# Patient Record
Sex: Male | Born: 1945
Health system: Southern US, Community
[De-identification: ages and names within clinical notes are randomized; demographics above are authoritative.]

## PROBLEM LIST (undated history)

## (undated) DIAGNOSIS — R0600 Dyspnea, unspecified: Secondary | ICD-10-CM

## (undated) DIAGNOSIS — N183 Chronic kidney disease, stage 3 unspecified: Secondary | ICD-10-CM

## (undated) DIAGNOSIS — E119 Type 2 diabetes mellitus without complications: Secondary | ICD-10-CM

## (undated) DIAGNOSIS — I2510 Atherosclerotic heart disease of native coronary artery without angina pectoris: Secondary | ICD-10-CM

## (undated) DIAGNOSIS — J449 Chronic obstructive pulmonary disease, unspecified: Secondary | ICD-10-CM

## (undated) DIAGNOSIS — E669 Obesity, unspecified: Secondary | ICD-10-CM

## (undated) DIAGNOSIS — I219 Acute myocardial infarction, unspecified: Secondary | ICD-10-CM

## (undated) DIAGNOSIS — K219 Gastro-esophageal reflux disease without esophagitis: Secondary | ICD-10-CM

## (undated) DIAGNOSIS — F329 Major depressive disorder, single episode, unspecified: Secondary | ICD-10-CM

## (undated) DIAGNOSIS — R413 Other amnesia: Secondary | ICD-10-CM

## (undated) DIAGNOSIS — D126 Benign neoplasm of colon, unspecified: Secondary | ICD-10-CM

## (undated) DIAGNOSIS — G4730 Sleep apnea, unspecified: Secondary | ICD-10-CM

## (undated) DIAGNOSIS — I10 Essential (primary) hypertension: Secondary | ICD-10-CM

## (undated) DIAGNOSIS — K922 Gastrointestinal hemorrhage, unspecified: Secondary | ICD-10-CM

## (undated) DIAGNOSIS — E7800 Pure hypercholesterolemia, unspecified: Secondary | ICD-10-CM

## (undated) DIAGNOSIS — K635 Polyp of colon: Secondary | ICD-10-CM

## (undated) DIAGNOSIS — F32A Depression, unspecified: Secondary | ICD-10-CM

## (undated) DIAGNOSIS — R42 Dizziness and giddiness: Secondary | ICD-10-CM

## (undated) DIAGNOSIS — K2270 Barrett's esophagus without dysplasia: Secondary | ICD-10-CM

## (undated) DIAGNOSIS — J9383 Other pneumothorax: Secondary | ICD-10-CM

## (undated) HISTORY — DX: Chronic obstructive pulmonary disease, unspecified: J44.9

## (undated) HISTORY — DX: Acute myocardial infarction, unspecified: I21.9

## (undated) HISTORY — DX: Dyspnea, unspecified: R06.00

## (undated) HISTORY — DX: Dizziness and giddiness: R42

## (undated) HISTORY — DX: Essential (primary) hypertension: I10

## (undated) HISTORY — DX: Other amnesia: R41.3

## (undated) HISTORY — DX: Gastrointestinal hemorrhage, unspecified: K92.2

## (undated) HISTORY — PX: VARICOCELECTOMY: SHX1084

## (undated) HISTORY — DX: Type 2 diabetes mellitus without complications: E11.9

## (undated) HISTORY — DX: Polyp of colon: K63.5

## (undated) HISTORY — DX: Atherosclerotic heart disease of native coronary artery without angina pectoris: I25.10

## (undated) HISTORY — DX: Other pneumothorax: J93.83

## (undated) HISTORY — DX: Sleep apnea, unspecified: G47.30

## (undated) HISTORY — DX: Chronic kidney disease, stage 3 unspecified: N18.30

## (undated) HISTORY — DX: Obesity, unspecified: E66.9

## (undated) HISTORY — DX: Gastro-esophageal reflux disease without esophagitis: K21.9

## (undated) HISTORY — DX: Barrett's esophagus without dysplasia: K22.70

## (undated) HISTORY — DX: Depression, unspecified: F32.A

## (undated) HISTORY — DX: Chronic kidney disease, stage 3 (moderate): N18.3

## (undated) HISTORY — PX: APPENDECTOMY: SHX54

## (undated) HISTORY — PX: TONSILLECTOMY: SUR1361

## (undated) HISTORY — DX: Benign neoplasm of colon, unspecified: D12.6

## (undated) HISTORY — DX: Pure hypercholesterolemia, unspecified: E78.00

## (undated) HISTORY — DX: Major depressive disorder, single episode, unspecified: F32.9

---

## 1985-11-25 HISTORY — PX: PLEURAL SCARIFICATION: SHX748

## 1999-04-14 ENCOUNTER — Encounter: Payer: Self-pay | Admitting: Emergency Medicine

## 1999-04-14 ENCOUNTER — Emergency Department (HOSPITAL_COMMUNITY): Admission: EM | Admit: 1999-04-14 | Discharge: 1999-04-14 | Payer: Self-pay | Admitting: Emergency Medicine

## 2000-04-24 ENCOUNTER — Encounter: Admission: RE | Admit: 2000-04-24 | Discharge: 2000-04-24 | Payer: Self-pay | Admitting: Family Medicine

## 2000-04-24 ENCOUNTER — Encounter: Payer: Self-pay | Admitting: Family Medicine

## 2001-02-02 ENCOUNTER — Encounter: Admission: RE | Admit: 2001-02-02 | Discharge: 2001-02-02 | Payer: Self-pay | Admitting: Family Medicine

## 2001-02-02 ENCOUNTER — Encounter: Payer: Self-pay | Admitting: Family Medicine

## 2001-02-03 ENCOUNTER — Encounter: Admission: RE | Admit: 2001-02-03 | Discharge: 2001-02-03 | Payer: Self-pay | Admitting: Family Medicine

## 2001-02-03 ENCOUNTER — Encounter: Payer: Self-pay | Admitting: Family Medicine

## 2001-02-06 ENCOUNTER — Encounter: Admission: RE | Admit: 2001-02-06 | Discharge: 2001-02-06 | Payer: Self-pay | Admitting: Thoracic Surgery

## 2001-02-06 ENCOUNTER — Encounter: Payer: Self-pay | Admitting: Thoracic Surgery

## 2001-02-11 ENCOUNTER — Encounter: Payer: Self-pay | Admitting: Thoracic Surgery

## 2001-02-11 ENCOUNTER — Encounter: Admission: RE | Admit: 2001-02-11 | Discharge: 2001-02-11 | Payer: Self-pay | Admitting: Thoracic Surgery

## 2001-02-17 ENCOUNTER — Encounter: Admission: RE | Admit: 2001-02-17 | Discharge: 2001-02-17 | Payer: Self-pay | Admitting: Thoracic Surgery

## 2001-02-17 ENCOUNTER — Encounter: Payer: Self-pay | Admitting: Thoracic Surgery

## 2001-04-03 ENCOUNTER — Encounter: Admission: RE | Admit: 2001-04-03 | Discharge: 2001-04-03 | Payer: Self-pay | Admitting: Thoracic Surgery

## 2001-04-03 ENCOUNTER — Encounter: Payer: Self-pay | Admitting: Thoracic Surgery

## 2001-04-17 ENCOUNTER — Encounter: Admission: RE | Admit: 2001-04-17 | Discharge: 2001-04-17 | Payer: Self-pay | Admitting: Thoracic Surgery

## 2001-04-17 ENCOUNTER — Encounter: Payer: Self-pay | Admitting: Thoracic Surgery

## 2001-04-22 ENCOUNTER — Inpatient Hospital Stay (HOSPITAL_COMMUNITY): Admission: RE | Admit: 2001-04-22 | Discharge: 2001-04-26 | Payer: Self-pay | Admitting: Thoracic Surgery

## 2001-04-22 ENCOUNTER — Encounter: Payer: Self-pay | Admitting: Thoracic Surgery

## 2001-04-23 ENCOUNTER — Encounter: Payer: Self-pay | Admitting: Thoracic Surgery

## 2001-04-24 ENCOUNTER — Encounter: Payer: Self-pay | Admitting: Thoracic Surgery

## 2001-04-25 ENCOUNTER — Encounter: Payer: Self-pay | Admitting: Thoracic Surgery

## 2001-04-26 ENCOUNTER — Encounter: Payer: Self-pay | Admitting: Thoracic Surgery

## 2001-05-11 ENCOUNTER — Encounter: Admission: RE | Admit: 2001-05-11 | Discharge: 2001-05-11 | Payer: Self-pay | Admitting: Thoracic Surgery

## 2001-05-11 ENCOUNTER — Encounter: Payer: Self-pay | Admitting: Thoracic Surgery

## 2001-05-27 ENCOUNTER — Encounter: Admission: RE | Admit: 2001-05-27 | Discharge: 2001-05-27 | Payer: Self-pay | Admitting: Thoracic Surgery

## 2001-05-27 ENCOUNTER — Encounter: Payer: Self-pay | Admitting: Thoracic Surgery

## 2001-07-29 ENCOUNTER — Encounter: Payer: Self-pay | Admitting: Thoracic Surgery

## 2001-07-29 ENCOUNTER — Encounter: Admission: RE | Admit: 2001-07-29 | Discharge: 2001-07-29 | Payer: Self-pay | Admitting: Thoracic Surgery

## 2001-10-27 ENCOUNTER — Encounter: Payer: Self-pay | Admitting: Thoracic Surgery

## 2001-10-27 ENCOUNTER — Encounter: Admission: RE | Admit: 2001-10-27 | Discharge: 2001-10-27 | Payer: Self-pay | Admitting: Thoracic Surgery

## 2002-03-16 ENCOUNTER — Encounter: Payer: Self-pay | Admitting: Family Medicine

## 2002-03-16 ENCOUNTER — Encounter: Admission: RE | Admit: 2002-03-16 | Discharge: 2002-03-16 | Payer: Self-pay | Admitting: Family Medicine

## 2002-08-31 ENCOUNTER — Encounter: Admission: RE | Admit: 2002-08-31 | Discharge: 2002-08-31 | Payer: Self-pay | Admitting: Thoracic Surgery

## 2002-08-31 ENCOUNTER — Encounter: Payer: Self-pay | Admitting: Thoracic Surgery

## 2005-11-25 HISTORY — PX: CORONARY ANGIOPLASTY WITH STENT PLACEMENT: SHX49

## 2006-02-28 ENCOUNTER — Ambulatory Visit: Payer: Self-pay | Admitting: Cardiovascular Disease

## 2006-02-28 ENCOUNTER — Inpatient Hospital Stay (HOSPITAL_COMMUNITY): Admission: EM | Admit: 2006-02-28 | Discharge: 2006-03-02 | Payer: Self-pay | Admitting: Emergency Medicine

## 2006-03-24 ENCOUNTER — Ambulatory Visit: Payer: Self-pay | Admitting: Cardiovascular Disease

## 2006-03-25 ENCOUNTER — Encounter: Admission: RE | Admit: 2006-03-25 | Discharge: 2006-03-25 | Payer: Self-pay | Admitting: Cardiovascular Disease

## 2006-03-28 ENCOUNTER — Encounter (HOSPITAL_COMMUNITY): Admission: RE | Admit: 2006-03-28 | Discharge: 2006-06-26 | Payer: Self-pay | Admitting: Cardiovascular Disease

## 2006-05-26 ENCOUNTER — Ambulatory Visit: Payer: Self-pay | Admitting: Cardiovascular Disease

## 2006-06-27 ENCOUNTER — Encounter (HOSPITAL_COMMUNITY): Admission: RE | Admit: 2006-06-27 | Discharge: 2006-08-05 | Payer: Self-pay | Admitting: Cardiovascular Disease

## 2006-08-29 ENCOUNTER — Ambulatory Visit: Payer: Self-pay | Admitting: Cardiovascular Disease

## 2006-11-27 ENCOUNTER — Ambulatory Visit (HOSPITAL_COMMUNITY): Admission: RE | Admit: 2006-11-27 | Discharge: 2006-11-27 | Payer: Self-pay | Admitting: Family Medicine

## 2007-03-04 ENCOUNTER — Ambulatory Visit: Payer: Self-pay | Admitting: Cardiovascular Disease

## 2007-08-26 ENCOUNTER — Ambulatory Visit: Payer: Self-pay

## 2007-08-31 ENCOUNTER — Ambulatory Visit: Payer: Self-pay | Admitting: Cardiovascular Disease

## 2007-10-09 ENCOUNTER — Ambulatory Visit (HOSPITAL_COMMUNITY): Admission: RE | Admit: 2007-10-09 | Discharge: 2007-10-09 | Payer: Self-pay | Admitting: *Deleted

## 2007-11-26 DIAGNOSIS — J449 Chronic obstructive pulmonary disease, unspecified: Secondary | ICD-10-CM

## 2007-11-26 HISTORY — DX: Chronic obstructive pulmonary disease, unspecified: J44.9

## 2008-06-28 ENCOUNTER — Ambulatory Visit (HOSPITAL_COMMUNITY): Admission: RE | Admit: 2008-06-28 | Discharge: 2008-06-28 | Payer: Self-pay | Admitting: Gastroenterology

## 2008-09-15 ENCOUNTER — Ambulatory Visit: Payer: Self-pay | Admitting: Cardiovascular Disease

## 2009-03-08 DIAGNOSIS — I10 Essential (primary) hypertension: Secondary | ICD-10-CM | POA: Insufficient documentation

## 2009-03-08 DIAGNOSIS — F329 Major depressive disorder, single episode, unspecified: Secondary | ICD-10-CM

## 2009-03-08 DIAGNOSIS — K219 Gastro-esophageal reflux disease without esophagitis: Secondary | ICD-10-CM | POA: Insufficient documentation

## 2009-03-08 DIAGNOSIS — F330 Major depressive disorder, recurrent, mild: Secondary | ICD-10-CM | POA: Insufficient documentation

## 2009-03-08 DIAGNOSIS — R0602 Shortness of breath: Secondary | ICD-10-CM

## 2009-03-08 DIAGNOSIS — E669 Obesity, unspecified: Secondary | ICD-10-CM | POA: Insufficient documentation

## 2009-03-08 DIAGNOSIS — E7800 Pure hypercholesterolemia, unspecified: Secondary | ICD-10-CM | POA: Insufficient documentation

## 2009-03-08 DIAGNOSIS — F332 Major depressive disorder, recurrent severe without psychotic features: Secondary | ICD-10-CM | POA: Insufficient documentation

## 2009-03-08 DIAGNOSIS — I2510 Atherosclerotic heart disease of native coronary artery without angina pectoris: Secondary | ICD-10-CM | POA: Insufficient documentation

## 2009-03-09 ENCOUNTER — Encounter: Payer: Self-pay | Admitting: Cardiovascular Disease

## 2009-03-09 ENCOUNTER — Ambulatory Visit: Payer: Self-pay | Admitting: Cardiovascular Disease

## 2009-03-14 ENCOUNTER — Telehealth: Payer: Self-pay | Admitting: Cardiovascular Disease

## 2009-08-03 ENCOUNTER — Encounter (INDEPENDENT_AMBULATORY_CARE_PROVIDER_SITE_OTHER): Payer: Self-pay | Admitting: *Deleted

## 2009-11-25 DIAGNOSIS — D126 Benign neoplasm of colon, unspecified: Secondary | ICD-10-CM

## 2009-11-25 HISTORY — DX: Benign neoplasm of colon, unspecified: D12.6

## 2009-12-14 ENCOUNTER — Ambulatory Visit: Payer: Self-pay | Admitting: Cardiovascular Disease

## 2009-12-14 ENCOUNTER — Telehealth: Payer: Self-pay | Admitting: Cardiovascular Disease

## 2009-12-15 ENCOUNTER — Other Ambulatory Visit: Payer: Self-pay | Admitting: Cardiovascular Disease

## 2009-12-15 ENCOUNTER — Encounter: Payer: Self-pay | Admitting: Cardiovascular Disease

## 2010-09-04 ENCOUNTER — Telehealth: Payer: Self-pay | Admitting: Cardiovascular Disease

## 2010-09-25 DIAGNOSIS — K635 Polyp of colon: Secondary | ICD-10-CM

## 2010-09-25 HISTORY — DX: Polyp of colon: K63.5

## 2010-10-02 ENCOUNTER — Ambulatory Visit (HOSPITAL_COMMUNITY): Admission: RE | Admit: 2010-10-02 | Discharge: 2010-10-02 | Payer: Self-pay | Admitting: Gastroenterology

## 2010-12-25 NOTE — Progress Notes (Signed)
Summary: lab work  Phone Note Other Incoming Call back at [phone]   Caller: Charmine/[facility] Summary of Call: pt needs to have lab work redrawn, please contact Initial call taken by: Migdalia Dk,  December 14, 2009 12:06 PM  Follow-up for Phone Call        lab wasn't drawn correctly pt needs to return and have it redrawn, pt is aware and will return on Fri 1/21 AM to have redrawn, Charmine is aware Meredith Staggers, RN  December 14, 2009 12:41 PM

## 2010-12-25 NOTE — Assessment & Plan Note (Signed)
Summary: rov   History of Present Illness: [name] is seen today for F/U of CAD.  He had a complex intervention to the LAD/D1 by Dr Juanda Chance in 2007.  He had a non-ischemic myovue in 2008.  He has sig reflux and needs to be on an H2 blocker.  We have him on Protonix instead of omeprazole.  He will have his platlet assay today to make sure his blood is thin enough.  He denies angina or SSCP.  He has been compliant with his meds.  He is active and still working full time at [employer]  Current Problems (verified): 1)  Depression  (ICD-311) 2)  Gerd  (ICD-530.81) 3)  Hypercholesterolemia  (ICD-272.0) 4)  Obesity  (ICD-278.00) 5)  Hypertension  (ICD-401.9) 6)  Dyspnea  (ICD-786.05) 7)  Cad  (ICD-414.00)  Current Medications (verified): 1)  Lipitor 80 Mg Tabs (Atorvastatin Calcium) .... Take One Tablet By Mouth Daily. 2)  Metoprolol Succinate 25 Mg Xr24h-Tab (Metoprolol Succinate) .... Take 1 Tablet By Mouth Once  A Day 3)  Plavix 75 Mg Tabs (Clopidogrel Bisulfate) .Marland Kitchen.. 1 Tablet By Mouth Once A Day 4)  Diovan 160 Mg Tabs (Valsartan) .... Take One Tablet By Mouth Daily 5)  Protonix 40 Mg Tbec (Pantoprazole Sodium) .Marland Kitchen.. 1 Tab By Mouth Once Daily 6)  Budeprion Xl 300 Mg Xr24h-Tab (Bupropion Hcl) .Marland Kitchen.. 1 Tab By Mouth Once Daily 7)  Symbicort 80-4.5 Mcg/act Aero (Budesonide-Formoterol Fumarate) .... As Needed 8)  Aspirin 81 Mg Tbec (Aspirin) .... Take One Tablet By Mouth Daily 9)  One A Day Vitamin .Marland Kitchen.. 1 Tab By Mouth Once Daily 10)  Redualvit Vitamin .Marland Kitchen.. 1 Tab By Mouth Once Daily  Allergies (verified): No Known Drug Allergies  Past History:  Past Medical History: Last updated: 03/08/2009 Current Problems:  DEPRESSION (ICD-311) GERD (ICD-530.81) HYPERCHOLESTEROLEMIA (ICD-272.0) OBESITY (ICD-278.00) HYPERTENSION (ICD-401.9) DYSPNEA (ICD-786.05) CAD (ICD-414.00)  Past Surgical History: Last updated: 03/08/2009 stenting of the LAD by Dr. Juanda Chance which was done in April 2007.  colonoscopy  Umbilical hernia repair  Foot surgery on his left foot by Dr. Lajoyce Corners  Family History: Last updated: 03/08/2009  coronary artery disease.  Social History: Last updated: 03/08/2009  He resides in [place] with his wife.  He is a Chartered certified accountant. Married  Tobacco Use - Yes.  Alcohol Use - no  Review of Systems       Denies fever, malais, weight loss, blurry vision, decreased visual acuity, cough, sputum, SOB, hemoptysis, pleuritic pain, palpitaitons, heartburn, abdominal pain, melena, lower extremity edema, claudication, or rash.   Vital Signs:  Patient profile:   65 year old male Pulse rate:   70 / minute BP supine:   130 / 80  Physical Exam  General:  Affect appropriate Healthy:  appears stated age HEENT: normal Neck supple with no adenopathy JVP normal no bruits no thyromegaly Lungs clear with no wheezing and good diaphragmatic motion Heart:  S1/S2 no murmur,rub, gallop or click PMI normal Abdomen: benighn, BS positve, no tenderness, no AAA no bruit.  No HSM or HJR Distal pulses intact with no bruits No edema Neuro non-focal Skin warm and dry    Impression & Recommendations:  Problem # 1:  HYPERCHOLESTEROLEMIA (ICD-272.0) LFT in 6 montsh His updated medication list for this problem includes:    Lipitor 80 Mg Tabs (Atorvastatin calcium) .Marland Kitchen... Take one tablet by mouth daily.  Problem # 2:  GERD (ICD-530.81) Continue Protonix unless Plavix shown to be ineffective by assay His updated medication list for  this problem includes:    Protonix 40 Mg Tbec (Pantoprazole sodium) .Marland Kitchen... 1 tab by mouth once daily  Problem # 3:  CAD (ICD-414.00) No angina.  Would like to do myovue in October  3 years since intervention.  Check platlet activity today. His updated medication list for this problem includes:    Metoprolol Succinate 25 Mg Xr24h-tab (Metoprolol succinate) .Marland Kitchen... Take 1 tablet by mouth once  a day    Plavix 75 Mg Tabs (Clopidogrel bisulfate) .Marland Kitchen... 1 tablet  by mouth once a day    Aspirin 81 Mg Tbec (Aspirin) .Marland Kitchen... Take one tablet by mouth daily  Patient Instructions: 1)  Your physician recommends that you have lab work today---PTY12- 414.0 401.9  530.81 272.0   go to [facility] admitting office to register--the lab will be done in the main laboratory at [facility] 2)  Your physician wants you to follow-up in: October 2011 with Dr Charlton Haws  You will receive a reminder letter in the mail two months in advance. If you don't receive a letter, please call our office to schedule the follow-up appointment.

## 2010-12-25 NOTE — Progress Notes (Signed)
Summary: discuss medication - colonscopy on 10/18  Phone Note From Other Clinic   Caller: [place] office [phone] Request: Talk with Nurse Summary of Call: discuss medication- pt having colonscopy on 10/18. Initial call taken by: Lorne Skeens,  September 04, 2010 9:55 AM  Follow-up for Phone Call        spoke with [facility] at [place] [facility], pt having colonoscopy 10/18 and they want to know if okay for pt to hold effient 5-7 days prior. will foward for dr Eden Emms review Deliah Goody, RN  September 04, 2010 1:25 PM   Additional Follow-up for Phone Call Additional follow up Details #1::        Yes it is ok to stop Additional Follow-up by: Colon Branch, MD, [facility],  September 06, 2010 10:18 AM    Additional Follow-up for Phone Call Additional follow up Details #2::    lmtcb Scherrie Bateman, LPN  September 06, 2010 10:24 AM SPOKE WITH MELISSA AWARE PT MAY STOP EFFIENT 5-7 DAYS PRIOR TO COLONOSCOPY. PER MELISSA ,PT'S TEST RESCHEDULED  FOR MID NOV. 2011. Follow-up by: Scherrie Bateman, LPN,  September 06, 2010 11:55 AM

## 2010-12-25 NOTE — Medication Information (Signed)
Summary: Protonix and Plavix  Protonix and Plavix   Imported By: Marylou Mccoy 12/14/2009 10:22:56  _____________________________________________________________________  External Attachment:    Type:   Image     Comment:   External Document

## 2011-01-12 ENCOUNTER — Inpatient Hospital Stay (HOSPITAL_COMMUNITY)
Admission: EM | Admit: 2011-01-12 | Discharge: 2011-01-14 | DRG: 582 | Disposition: A | Discharge status: Home / Self Care | Payer: BC Managed Care – PPO | Attending: Internal Medicine | Admitting: Internal Medicine

## 2011-01-12 ENCOUNTER — Emergency Department (HOSPITAL_COMMUNITY): Payer: BC Managed Care – PPO

## 2011-01-12 DIAGNOSIS — IMO0002 Reserved for concepts with insufficient information to code with codable children: Principal | ICD-10-CM | POA: Diagnosis present

## 2011-01-12 DIAGNOSIS — K219 Gastro-esophageal reflux disease without esophagitis: Secondary | ICD-10-CM | POA: Diagnosis present

## 2011-01-12 DIAGNOSIS — D62 Acute posthemorrhagic anemia: Secondary | ICD-10-CM | POA: Diagnosis present

## 2011-01-12 DIAGNOSIS — Z87891 Personal history of nicotine dependence: Secondary | ICD-10-CM

## 2011-01-12 DIAGNOSIS — E785 Hyperlipidemia, unspecified: Secondary | ICD-10-CM | POA: Diagnosis present

## 2011-01-12 DIAGNOSIS — I214 Non-ST elevation (NSTEMI) myocardial infarction: Secondary | ICD-10-CM | POA: Diagnosis present

## 2011-01-12 DIAGNOSIS — Z8249 Family history of ischemic heart disease and other diseases of the circulatory system: Secondary | ICD-10-CM

## 2011-01-12 DIAGNOSIS — E7800 Pure hypercholesterolemia, unspecified: Secondary | ICD-10-CM | POA: Diagnosis present

## 2011-01-12 DIAGNOSIS — F3289 Other specified depressive episodes: Secondary | ICD-10-CM | POA: Diagnosis present

## 2011-01-12 DIAGNOSIS — Z7902 Long term (current) use of antithrombotics/antiplatelets: Secondary | ICD-10-CM

## 2011-01-12 DIAGNOSIS — I10 Essential (primary) hypertension: Secondary | ICD-10-CM | POA: Diagnosis present

## 2011-01-12 DIAGNOSIS — E669 Obesity, unspecified: Secondary | ICD-10-CM | POA: Diagnosis present

## 2011-01-12 DIAGNOSIS — I252 Old myocardial infarction: Secondary | ICD-10-CM

## 2011-01-12 DIAGNOSIS — K922 Gastrointestinal hemorrhage, unspecified: Secondary | ICD-10-CM

## 2011-01-12 DIAGNOSIS — Z9861 Coronary angioplasty status: Secondary | ICD-10-CM

## 2011-01-12 DIAGNOSIS — Y92009 Unspecified place in unspecified non-institutional (private) residence as the place of occurrence of the external cause: Secondary | ICD-10-CM

## 2011-01-12 DIAGNOSIS — I2510 Atherosclerotic heart disease of native coronary artery without angina pectoris: Secondary | ICD-10-CM | POA: Diagnosis present

## 2011-01-12 DIAGNOSIS — Y849 Medical procedure, unspecified as the cause of abnormal reaction of the patient, or of later complication, without mention of misadventure at the time of the procedure: Secondary | ICD-10-CM | POA: Diagnosis present

## 2011-01-12 DIAGNOSIS — F329 Major depressive disorder, single episode, unspecified: Secondary | ICD-10-CM | POA: Diagnosis present

## 2011-01-12 LAB — CBC
MCH: 27.4 pg (ref 26.0–34.0)
MCHC: 33.1 g/dL (ref 30.0–36.0)
MCHC: 33.6 g/dL (ref 30.0–36.0)
Platelets: 194 10*3/uL (ref 150–400)
RBC: 3.61 MIL/uL — ABNORMAL LOW (ref 4.22–5.81)
RDW: 14.1 % (ref 11.5–15.5)

## 2011-01-12 LAB — DIFFERENTIAL
Basophils Absolute: 0 10*3/uL (ref 0.0–0.1)
Basophils Relative: 0 % (ref 0–1)
Eosinophils Absolute: 0.1 10*3/uL (ref 0.0–0.7)
Eosinophils Relative: 1 % (ref 0–5)
Monocytes Absolute: 0.7 10*3/uL (ref 0.1–1.0)

## 2011-01-12 LAB — ABO/RH: ABO/RH(D): A POS

## 2011-01-12 LAB — POCT I-STAT, CHEM 8
Calcium, Ion: 1.13 mmol/L (ref 1.12–1.32)
Glucose, Bld: 180 mg/dL — ABNORMAL HIGH (ref 70–99)
HCT: 31 % — ABNORMAL LOW (ref 39.0–52.0)
Hemoglobin: 10.5 g/dL — ABNORMAL LOW (ref 13.0–17.0)
Potassium: 4.4 mEq/L (ref 3.5–5.1)

## 2011-01-12 LAB — COMPREHENSIVE METABOLIC PANEL
AST: 25 U/L (ref 0–37)
Albumin: 3.5 g/dL (ref 3.5–5.2)
Alkaline Phosphatase: 56 U/L (ref 39–117)
BUN: 19 mg/dL (ref 6–23)
GFR calc Af Amer: >60 mL/min (ref >60)
Potassium: 4.4 mEq/L (ref 3.5–5.1)
Sodium: 140 mEq/L (ref 135–145)
Total Protein: 6.3 g/dL (ref 6.0–8.3)

## 2011-01-12 LAB — CK TOTAL AND CKMB (NOT AT ARMC)
CK, MB: 1.5 ng/mL (ref 0.3–4.0)
CK, MB: 2.1 ng/mL (ref 0.3–4.0)
Relative Index: 1.4 (ref 0.0–2.5)
Relative Index: 2.1 (ref 0.0–2.5)

## 2011-01-13 DIAGNOSIS — I200 Unstable angina: Secondary | ICD-10-CM

## 2011-01-13 LAB — BASIC METABOLIC PANEL
Calcium: 8.2 mg/dL — ABNORMAL LOW (ref 8.4–10.5)
Chloride: 109 mEq/L (ref 96–112)
Creatinine, Ser: 1.13 mg/dL (ref 0.4–1.5)
GFR calc Af Amer: >60 mL/min (ref >60)

## 2011-01-13 LAB — CBC
MCH: 26.9 pg (ref 26.0–34.0)
MCH: 27 pg (ref 26.0–34.0)
MCHC: 33.2 g/dL (ref 30.0–36.0)
MCHC: 33.4 g/dL (ref 30.0–36.0)
MCV: 81.1 fL (ref 78.0–100.0)
Platelets: 192 10*3/uL (ref 150–400)
Platelets: 204 10*3/uL (ref 150–400)
RBC: 3.82 MIL/uL — ABNORMAL LOW (ref 4.22–5.81)
RDW: 14.4 % (ref 11.5–15.5)
RDW: 14.4 % (ref 11.5–15.5)
WBC: 8.1 10*3/uL (ref 4.0–10.5)

## 2011-01-13 LAB — CK TOTAL AND CKMB (NOT AT ARMC)
Relative Index: INVALID (ref 0.0–2.5)
Total CK: 97 U/L (ref 7–232)

## 2011-01-14 ENCOUNTER — Inpatient Hospital Stay (HOSPITAL_COMMUNITY): Payer: BC Managed Care – PPO

## 2011-01-14 DIAGNOSIS — R079 Chest pain, unspecified: Secondary | ICD-10-CM

## 2011-01-14 DIAGNOSIS — I214 Non-ST elevation (NSTEMI) myocardial infarction: Secondary | ICD-10-CM

## 2011-01-14 LAB — BASIC METABOLIC PANEL
GFR calc Af Amer: >60 mL/min (ref >60)
GFR calc non Af Amer: >60 mL/min (ref >60)
Glucose, Bld: 113 mg/dL — ABNORMAL HIGH (ref 70–99)
Potassium: 3.7 mEq/L (ref 3.5–5.1)
Sodium: 139 mEq/L (ref 135–145)

## 2011-01-14 LAB — CBC
HCT: 29.7 % — ABNORMAL LOW (ref 39.0–52.0)
Hemoglobin: 9.7 g/dL — ABNORMAL LOW (ref 13.0–17.0)
RDW: 14.5 % (ref 11.5–15.5)
WBC: 6.5 10*3/uL (ref 4.0–10.5)

## 2011-01-14 MED ORDER — TECHNETIUM TC 99M TETROFOSMIN IV KIT
30.0000 | PACK | Freq: Once | INTRAVENOUS | Status: AC | PRN
  Administered 2011-01-14: 30 via INTRAVENOUS

## 2011-01-14 MED ORDER — TECHNETIUM TC 99M TETROFOSMIN IV KIT
10.0000 | PACK | Freq: Once | INTRAVENOUS | Status: AC | PRN
  Administered 2011-01-14: 10 via INTRAVENOUS

## 2011-01-15 ENCOUNTER — Inpatient Hospital Stay (HOSPITAL_COMMUNITY): Payer: BC Managed Care – PPO

## 2011-01-16 LAB — CROSSMATCH: Unit division: 0

## 2011-01-24 NOTE — Consult Note (Signed)
NAMEGILES, [name]                 ACCOUNT NO.:  [id number]  MEDICAL RECORD NO.:  [id number]           PATIENT TYPE:  I  LOCATION:  2925                         FACILITY:  [facility]  PHYSICIAN:  Learta Codding, MD,FACC DATE OF BIRTH:  Dec 09, 1945  DATE OF CONSULTATION:  01/12/2011 DATE OF DISCHARGE:                                CONSULTATION   REFERRING PHYSICIAN:  Lonia Blood, MD of Hospitalist Service.  PRIMARY CARDIOLOGIST:  Noralyn Pick. Eden Emms, MD, [facility]  REASON FOR CONSULTATION:  Status post GI bleeding after polypectomy after resuming Prusgrel.  HISTORY OF PRESENT ILLNESS:  The patient is a pleasant 65 year old male with a history of coronary artery disease who had a complex intervention to the first diagonal and LAD proper by Dr. Juanda Chance in 2007 and had a nonischemic Myoview in 2008.  He has a history of significant reflux and he has been treated for that.  Dr. Eden Emms reportedly did a P2Y12 test in January 2011 and he was switched from Plavix to Prasugrel.  He has done well with this.  The patient did have apparently a colonoscopy in November 2011 when 2 colonic polyps were removed.  He had a flat polyp in the cecum that was biopsied and not amenable to endoscopic resection.  The patient was recently referred to a tertiary care center at [facility] for removal of the flat polyp.  The patient was given instructions to stop Prasugrel 1 week before his colonoscopy.  However, he was told that he could resume Prasugrel after his procedure.  He also resumed his aspirin.  Today, the patient suddenly became nauseated and noticed a large bloody bowel movement which caused severe weakness.  EMS was called.  The patient also reported substernal chest pain, although he had no acute EKG changes on arrival to the emergency room.  His wife did give him 2 nitroglycerin prior to arrival of EMS.  In the emergency room, the patient has had no further bleeding.  His hemoglobin is around 10, but his  baseline is around 12.  He is being admitted by the hospitalist service and is being seen by GI.  We have been asked to follow the patient and make recommendations regarding his continued use of aspirin and Prasugrel.  The patient stated that he is currently pain-free.  He has no chest pain.  He has no shortness of breath.  His EKG showed no acute ischemic changes.  PAST MEDICAL HISTORY: 1. History of hypercholesteremia. 2. Depression. 3. Obesity. 4. Coronary artery disease and stenting of the LAD by Dr. Juanda Chance in     April 2007. 5. Umbilical hernia repair. 6. Foot surgery by Dr. Lajoyce Corners.  FAMILY HISTORY:  Consistent with coronary artery disease.  SOCIAL HISTORY:  The patient stays with his wife.  Wife is a Chartered certified accountant. He smokes.  He is married.  He denies any alcohol use.  MEDICATIONS ON ADMISSION: 1. Effient 10 mg a day. 2. Bupropion. 3. Diovan 160 mg a day. 4. Metoprolol tartrate 25 mg a day. 5. Imipramine. 6. Pantoprazole. 7. Lipitor. 8. Symbicort. 9. Nitroglycerin.  REVIEW OF SYSTEMS:  The patient denies  currently and nausea or vomiting, no fever or chills.  Positive for hematemesis.  No dysuria or frequency. Remainder of 18-point review of systems is otherwise negative.  PHYSICAL EXAMINATION:  VITAL SIGNS:  Blood pressure is 118/71, heart rate is 83, and temperature is 98.1. GENERAL:  A well-nourished white male in no apparent distress. HEENT:  Pupils isocoric.  PERRLA, EOMI. NECK:  Supple.  Normal carotid upstroke.  No carotid bruits. LUNGS:  Clear breath sounds bilaterally. HEART:  Regular rate and rhythm.  Normal S1 and S2.  No murmurs, rubs, or gallops. ABDOMEN:  Soft and nontender.  No rebound or guarding.  Good bowel sounds. EXTREMITIES:  No cyanosis, clubbing, or edema. NEUROLOGIC:  The patient alert and oriented, grossly nonfocal. SKIN:  Warm and dry. PSYCHIATRIC:  Normal affect.  LABORATORY WORK:  White count is 10.8, hemoglobin is 10.3, hematocrit 31.1,  and platelet count is 219.  Sodium is 140, potassium is 4.4, chloride is 108, carbon dioxide 23, glucose 184, creatinine 1.36, calcium 8.5, total protein 6.3.  Initial troponin was 0.01.  A 12-lead electrocardiogram showed no acute ischemic changes.  PROBLEM LIST: 1. Acute gastrointestinal bleed. 2. Status post complex intervention to the left anterior descending     and diagonal by Dr. Juanda Chance in 2007, on Prasugrel.     a.     Switched from Plavix to Prasugrel after P2Y12 testing a year      ago, despite no recurrent problems with angina or stent      thrombosis. 3. Coronary artery disease.  Details as outlined above.  PLAN: 1. I have discussed with Dr. Lavera Guise the patient's condition.  I agree to     hold aspirin and Plavix at the present time.  I also spoke with the     gastroenterologist and he would also prefer that in addition to     stopping Prasugrel that aspirin is also discontinued.  The patient     had this procedure done on several years ago.  He should be at low     risk for stent thrombosis. 2. The patient will likely need repeat colonoscopy and this will be     decided by his primary service and     Gastroenterology. 3. We would recommend a follow up EKG in the morning as well as serial     cardiac enzymes, but at this point, no further testing is needed.     Learta Codding, MD,FACC     GED/MEDQ  D:  01/12/2011  T:  01/13/2011  Job:  [id number]  Electronically Signed by Lewayne Bunting MDFACC on 01/24/2011 10:28:19 AM

## 2011-01-24 NOTE — H&P (Signed)
[name], [name]                 ACCOUNT NO.:  [id number]  MEDICAL RECORD NO.:  [id number]           PATIENT TYPE:  E  LOCATION:  [facility]                         FACILITY:  [facility]  PHYSICIAN:  Lonia Blood, M.D.       DATE OF BIRTH:  [birth year]  DATE OF ADMISSION:  01/12/2011 DATE OF DISCHARGE:                             HISTORY & PHYSICAL   PRIMARY CARE PHYSICIAN:  Bryan Lemma. Ehinger, M.D.  CHIEF COMPLAINT:  Blood gushing out of me and my chest hurting.  HISTORY OF PRESENT ILLNESS:  Mr. Onnen is a 65 year old gentleman with history of myocardial infarction and stenting of the LAD in April 2007, who also had a recent elective polypectomy at [facility].  Postpolypectomy, he was put back on his Effient.  This morning, he felt queasy in stomach and then promptly he dispatched a very large bloody bowel movement.  Soon after, he fell woozy, and he had started having severe chest pressure with shortness ofbreath and radiating into his left arm.  EMS was dispatched.  The patient received nitroglycerin, which help with this chest pain, and then he arrived to the emergency room.  In the emergency room, he was hypotensive, diaphoretic.  He was bolused twice and immensely increased his blood pressure to 118/71, and they called [place] to admit him.  He is currently chest pain free.  He has not had any more bloody bowel movement since he arrived.  PAST MEDICAL HISTORY: 1. Hypertension. 2. Coronary artery disease. 3. Gastroesophageal reflux disease. 4. History of pneumothoraces. 5. Hyperlipidemia.  HOME MEDICATIONS: 1. Effient 10 mg daily. 2. Bupropion 300 mg daily. 3. Diovan 160 mg daily. 4. Metoprolol 25 mg daily. 5. Imipramine 50 mg daily. 6. Protonix 40 mg daily. 7. Lipitor 80 mg daily. 8. Symbicort 80/4.5 one puff twice a day.  ALLERGIES:  CODEINE.  SOCIAL HISTORY:  The patient does not smoke cigarettes, does not drink alcohol.  Works in a factory.   He quit smoking in 1991, and he has a 30- pack-year history of smoking.  FAMILY HISTORY:  Positive for coronary artery disease.  REVIEW OF SYSTEMS:  As per HPI.  Also positive for some nausea, generalized weakness.  No focal weakness and numbness.  PHYSICAL EXAMINATION:  VITAL SIGNS:  Temperature of 98.1, blood pressure 96/59, pulse 80, respirations 18, saturation 100% on 2 liters of oxygen. GENERAL APPEARANCE:  One of a pale, anxious gentleman.  Alert and oriented to place, person, and time. HEENT:  Head is normocephalic, atraumatic.  Eyes; pupils are equal, round, and reactive to light and accommodation.  Extraocular movements are intact.  Throat is clear. NECK:  Supple.  No JVD. CHEST:  Clear to auscultation.  No wheezes, rhonchi, or crackles. HEART:  Regular rate and rhythm without murmurs, rubs, or gallops. ABDOMEN:  Soft, nontender.  Bowel sounds are present. LOWER EXTREMITIES:  Without edema. SKIN:  Pale, dry.  No suspicious looking rashes. NEUROLOGIC:  Cranial nerves II through XII are intact.  Strength is 5/5 in all 4 extremities.  Sensation is intact.  LABORATORY DATA:  Laboratory values  on admission; white blood cell count is 10.8, hemoglobin 10.3, platelet count 219, [place] 8.4, sodium 140, potassium 4.4, chloride 108, bicarbonate 23, BUN 19, creatinine 1.3, glucose 184, albumin 3.5, troponin-I 0.01.  Sodium is 140, potassium 4.4, chloride 106, BUN 23, creatinine 1.3, calcium 1.1.  Portable chest x-ray shows bibasilar atelectasis.  EKG; normal sinus rhythm without any dynamic acute ST-T changes.  ASSESSMENT:  This is a 65 year old gentleman presenting with large gastrointestinal bleed, suspected to be lower in nature, due to probably post polypectomy versus diverticular bleed versus other cause.  I am doubtful this is upper GI bleed as the patient seems to be fairly stable hemodynamically.  The result of the gastrointestinal bleed is an acute blood loss anemia with drop  in the pressure and hypotension, probably resulting in demand cardiac ischemia.  PLAN:  Mr. Kempker to go to 2900 step-down unit with close cardiac monitoring, transfuse him 2 units of packed red blood cells, take him off of the Effient as approved by the consulting cardiologist, Dr. Andee Lineman.  Dr. Ewing Schlein, Gastroenterology has been called to consult on the case as well.  Further plans of care will depend on the patient's progression off of the Effient after the transfusion.  I am contemplating a nuclear medicine tagged red blood cell scan if he continues to bleed.  For now, I am going to discontinue Diovan, metoprolol as the blood pressure is not allowing me to continue these medications.  I am going to continue the Protonix, Lipitor, Symbicort, and bupropion.     Lonia Blood, M.D.     SL/MEDQ  D:  01/12/2011  T:  01/12/2011  Job:  [id number]  cc:   Noralyn Pick. Eden Emms, MD, [facility] Shirley Friar, MD Bryan Lemma. Manus Gunning, M.D.  Electronically Signed by Lonia Blood M.D. on 01/24/2011 05:32:26 PM

## 2011-01-24 NOTE — Discharge Summary (Signed)
[name], [name]                 ACCOUNT NO.:  [id number]  MEDICAL RECORD NO.:  [id number]           PATIENT TYPE:  I  LOCATION:  2506                         FACILITY:  [facility]  PHYSICIAN:  Lonia Blood, M.D.       DATE OF BIRTH:  [birth year]  DATE OF ADMISSION:  01/12/2011 DATE OF DISCHARGE:  01/14/2011                              DISCHARGE SUMMARY   DISCHARGE DIAGNOSES: 1. Lower gastrointestinal bleeding, most likely post polypectomy. 2. Acute blood loss anemia status post transfused 2 units of packed     red blood cells. 3. Non-ST-elevation myocardial infarction. 4. Coronary artery disease with negative inducible ischemia,on     Myoview. 5. History of myocardial infarction status post stent to the left     anterior descending, April 2007. 6. Hyperlipidemia.  DISCHARGE MEDICATIONS: 1. Bupropion XL 300 mg daily. 2. Diovan 160 mg daily. 3. Iron 1 tablet daily. 4. Imipramine 50 mg daily. 5. Lipitor 80 mg daily. 6. Metoprolol 25 mg daily. 7. Multivitamin 1 tablet daily. 8. Nitroglycerin 0.4 mg every 5 minutes as needed for chest pain. 9. Protonix 40 mg daily. 10.Symbicort 80/4.5 mcg 2 puffs twice a day.  CONDITION ON DISCHARGE:  [name] was discharged in good condition, alert, oriented, in no acute distress, and stable vital signs. Discharge hemoglobin was 9.7.  He will follow up with Dr. Charlott Rakes from [facility] and Dr. Charlton Haws from [facility].  He will also follow up with his primary care physician, Dr. Blair Heys.  PROCEDURES THIS ADMISSION:  The patient underwent a Myoview stress test on January 14, 2011 which was negative for inducible ischemia. Ejection fraction was 69%.  CONSULTATION THIS ADMISSION:  The patient was seen in consultation by Dr. Lewayne Bunting from [facility] and Dr. Leary Roca from [facility].  HISTORY AND PHYSICAL:  Refer to the dictated H and P which was done by Dr. Lonia Blood.  HOSPITAL  COURSE:  [name] is a 65 year old gentleman with history of coronary artery disease and remote stenting who was admitted from the emergency room after he had an episode of massive lower gastrointestinal bleeding.  He also had acute blood loss anemia and he appeared in borderline shock upon admission.  He had a non-ST elevation myocardial infarction with peak troponin at 0.58.  The patient received 2 units of packed red blood cells and he had no further of bloody bowel movement in the hospital.  We felt the source of his lower gastrointestinal bleeding was his polypectomy at [facility]. Post transfusion of 2 units of packed red blood cells, the patient's hemoglobin remained stable at 9.7-9.8.  The patient was taken off Effient and aspirin and he had a Myoview stress test which was negative for inducible ischemia.  We felt that it would be safe to continue to hold the Effient and Plavix until the polypectomy site heals.  The patient will follow up with Dr. Charlton Haws and Dr. Charlott Rakes to decide the best timing of resumption of his antiplatelet therapy.     Lonia Blood, M.D.  SL/MEDQ  D:  01/14/2011  T:  01/15/2011  Job:  [id number]  cc:   Noralyn Pick. Eden Emms, MD, [facility] Shirley Friar, MD Bryan Lemma. Manus Gunning, M.D.  Electronically Signed by Lonia Blood M.D. on 01/24/2011 05:32:24 PM

## 2011-02-01 NOTE — Consult Note (Signed)
  NAMECROSBY, ORIORDAN NO.:  [id number]  MEDICAL RECORD NO.:  [id number]           PATIENT TYPE:  I  LOCATION:  2925                         FACILITY:  [facility]  PHYSICIAN:  Petra Kuba, M.D.    DATE OF BIRTH:  1946-05-21  DATE OF CONSULTATION: DATE OF DISCHARGE:                                CONSULTATION   HISTORY:  Patient known to my partner, Charlott Rakes with a difficult to remove cecal polyp referred to [facility] where he underwent a significant polypectomy.  Unfortunately although his aspirin was stopped, his Plavix was continued; and at home today, he had two bouts of bloody diarrhea with near syncope, was brought to the emergency room after resuscitation for IV fluids and is currently fine.  He has not had any pain and no previous bleeding and no obvious other problem from the procedure.  His previous colonoscopies from our office were reviewed.  PAST MEDICAL HISTORY:  Pertinent for coronary artery disease with a stent years ago, hypertension, reflux, and increased cholesterol.  SOCIAL HISTORY:  Pertinent for quit smoking in 1991, does not drink.  ALLERGIES:  CODEINE.  MEDICATIONS AT HOME:  Effient which is Plavix like medicine, bupropion, Diovan, Lopressor, imipramine, Protonix, Lipitor, and Symbicort.  REVIEW OF SYSTEMS:  Negative except above.  PHYSICAL EXAM:  No acute distress, lying comfortably in the bed.  Vital signs are stable, afebrile.  Exam is pertinent for his abdomen being soft, nontender.  Labs pertinent for hemoglobin of 10.3 with a white count of 10.8 and platelets 219.  He also had some chest pain, not mentioned above. Cardiology saw him and his CPKs and troponins were negative.  BUN and creatinine were normal.  Liver tests normal.  ASSESSMENT:  Probable post polypectomy bleeding in a patient who continued his Plavix like medicine.  PLAN:  I have discussed with cardiology, okay to hold aspirin and Plavix, will keep  on clear liquids and follow clinical course.  We discussed repeat colonoscopy.  His endoscopic report at [facility] was reviewed with pictures and will follow with you with further workup and plans pending re-bleeding. If no further re-bleeding, we will probably keep him on clear liquids for 2 days and then decide with cardiology how long we can hold aspirin and Plavix in their opinion.          ______________________________ Petra Kuba, M.D.     MEM/MEDQ  D:  01/13/2011  T:  01/13/2011  Job:  [id number]  cc:   Shirley Friar, MD Bryan Lemma. Manus Gunning, M.D. GI Department at [facility]  Electronically Signed by Vida Rigger M.D. on 01/31/2011 08:00:06 AM

## 2011-02-10 LAB — PLATELET INHIBITION P2Y12
Platelet Function  P2Y12: 261 [PRU] (ref 194–418)
Platelet Function Baseline: 318 [PRU] (ref 194–418)

## 2011-02-27 ENCOUNTER — Other Ambulatory Visit: Payer: Self-pay | Admitting: Cardiovascular Disease

## 2011-04-09 NOTE — Op Note (Signed)
NAMEHYRUM, [name]                 ACCOUNT NO.:  [id number]   MEDICAL RECORD NO.:  [id number]          PATIENT TYPE:  AMB   LOCATION:  ENDO                         FACILITY:  [facility]   PHYSICIAN:  Shirley Friar, MDDATE OF BIRTH:  1946/02/21   DATE OF PROCEDURE:  06/28/2008  DATE OF DISCHARGE:                               OPERATIVE REPORT   INDICATION:  Recent colonoscopy showing flat polyp that turned out to be  a tubular adenoma, biopsied.   MEDICATIONS:  Fentanyl 150 mcg IV and Versed 12 mg IV.   FINDINGS:  Rectal exam was normal.  A pediatric Pentax colonoscope was  inserted into an adequately prepped colon, advanced to the cecum where  ileocecal valve and appendiceal orifice were identified.  In order to  reach the cecum, the patient had to be turned in a supine position and  abdominal pressure had to be applied as well as repeated loop reduction  was necessary due to excessive looping.  The previously noted cecal  polyp was again visualized and the incision was made to inject saline  into and around the polyp, however, the polyp did not raise sufficiently  off the wall to allow snare cautery.  Decision was then made to use  argon plasma coagulation of this polyp and this was done with  satisfactory results.  On careful withdrawal of the colonoscope, no  other mucosal abnormalities were seen.   ASSESSMENT:  Status post argon plasma coagulation of flat cecal polyp  without any immediate complications.   PLAN:  1. Repeat colonoscopy in 2 years.  2. Hold Plavix for another 7 days and aspirin for 2 weeks and then      resume as previously stated dose.      Shirley Friar, MD  Electronically Signed     VCS/MEDQ  D:  06/28/2008  T:  06/29/2008  Job:  [id number]

## 2011-04-09 NOTE — Op Note (Signed)
[name], [name]                 ACCOUNT NO.:  [id number]   MEDICAL RECORD NO.:  [id number]          PATIENT TYPE:  AMB   LOCATION:  DAY                          [facility]ACILITY[facility]  [facility]   PHYSICIAN:  Alfonse Ras, MD   DATE OF BIRTH:  1945-12-28   DATE OF PROCEDURE:  10/09/2007  DATE OF DISCHARGE:                               OPERATIVE REPORT   PREOPERATIVE DIAGNOSIS:  Umbilical hernia.   POSTOPERATIVE DIAGNOSIS:  Umbilical hernia.   PROCEDURE:  Umbilical hernia repair with #1 Surgilon, no mesh.   ANESTHESIA:  General laryngeal mask.   SURGEON:  Alfonse Ras, MD   DESCRIPTION:  The patient was taken to the operating room, placed in the  supine position.  After adequate general anesthesia was induced using  laryngeal mask, the abdomen was prepped and draped in the normal sterile  fashion.  Using a small transverse supraumbilical incision, I dissected  down onto the hernia sac which was easily mobilized.  The fascial defect  was only about 1 cm in diameter and was closed with interrupted figure-  of-eight 0 Surgilon sutures.  Adequate hemostasis was ensured.  The  tissues were injected using 0.5 Marcaine.  Skin was closed with a  running subcuticular 3-0 Monocryl.  Steri-Strips and sterile dressings  were applied.  The patient tolerated the procedure well and went to PACU  in good condition.      Alfonse Ras, MD  Electronically Signed     KRE/MEDQ  D:  10/09/2007  T:  10/10/2007  Job:  [id number]

## 2011-04-09 NOTE — Assessment & Plan Note (Signed)
[facility]                            CARDIOLOGY OFFICE NOTE   [name], [name]                        MRN:          [id number]  DATE:08/31/2007                            DOB:          1946-01-29    [name] returns today for followup.   [name] has known coronary artery disease.  I had initially seen him for  chest pain and dyspnea.  [name] had an anterolateral wall MI in April 2007,  treated with complex intervention of the LAD diagonal by Dr. Juanda Chance.  [name]  has normal LV function.  [name] had some residual nonobstructive right  coronary disease.  The patient had a drug-eluting stent placed.  [name] has  a Myoview study done on October 1st, which [name] was able to exercise for  10 minutes.  [name] stopped due to shortness of breath.  His heart rate and  blood pressure response was appropriate.  I have reviewed his EKGs and  they were nonischemic.  His ejection fraction was normal at 60%.  There  was some minor apical thinning that I did not think was significant.  There was clearly no evidence of restenosis in the mid LAD.   In talking to the patient, [name] continues to have exertional dyspnea and  sweating easily.  I do not think these are related to his heart.  [name]  apparently had PFT ordered by Dr. Manus Gunning and [name] has some degree of  COPD.  [name] quit smoking in 2000.  In regards to his diaphoresis, it are  not clearly to hypoglycemic events.  [name] may have baseline autonomic tone  that makes him prone to sweating but I do not think that these are  related to his heart.  There have been no fevers, no rigors, no signs of  focal infection.   REVIEW OF SYSTEMS:  Otherwise, negative.  In particular, [name] has not had  significant chest pain.  [name] does have fresh nitroglycerin that is only 2  months old and I reiterated the fact that [name] needs to get this yearly.   CURRENT MEDICATIONS:  1. Plavix 75 a day.  2. Aspirin a day.  3. Wellbutrin 300 a day.  4. Nortriptyline.  5. Protonix  40 a day.  6. Diovan 160.  7. Lipitor 40 a day.  8. Metoprolol back on at 25 a day.  9. Budeprion 300 mg a day.  10.Spiriva 18 mcg a day.   PHYSICAL EXAMINATION:  GENERAL:  Remarkable for an overweight, middle-  aged, white male in no distress.  Affect is appropriate.  VITAL SIGNS:  Weight is 220, blood pressure is 125/75, pulse is 85.  [name]  is afebrile.  Respiratory rate is 14.  HEENT:  Normal.  NECK:  Carotids normal without bruit.  There is no lymphadenopathy, no  thyromegaly, no JVP elevation.  LUNGS:  Clear.  Good diaphragmatic motion.  No wheezing.  HEART:  There is an S1 S2 with normal heart sounds.  PMI is normal.  ABDOMEN:  Benign.  Bowel sounds are positive. There is no tenderness, no  hepatosplenomegaly, no hepatojugular  reflux, no AAA.  VASCULAR:  Femorals are plus 3 bilaterally without bruit.  PTs are plus  3.  EXTREMITIES:  There is no lower extremity edema.  NEUROLOGIC:  Nonfocal.  There is no muscular weakness.  SKIN:  Warm and dry.   Baseline EKG is normal.   IMPRESSION:  1. Coronary artery disease, stenting of the left anterior descending      artery diagonal in April 2007, nonischemic Myoview.  Continue      aspirin.  Back on beta-blocker.  This was initially stopped by his      primary care medical doctor placed him back on it.  It has not      exacerbated his chronic obstructive pulmonary disease.  At some      point, we may increase it further to get a relative heart rate of      70 at rest.  However, [name] was not hypertensive with exercise.  2. Dyspnea related to probable chronic obstructive pulmonary disease.      Follow with Dr. Manus Gunning.  Continue Spiriva.  Abstained from smoking      for the last 7 years.  Will likely need a flu shot in the next 4      weeks.  3. Hypercholesterolemia in the setting of coronary disease.  Continue      Lipitor 40 a day.  Follow up with a liver and lipid profile in 6      months.  4. Episodes of sweating not clearly  related to infection or cardiac      disease.  I am not sure that there is any specific abnormality      related to this.  Follow up with Dr. Manus Gunning.  No need for further      testing.  5. Hypertension, currently well controlled.  Continue Diovan 160 a day      and a low salt diet.   As long as the patient does not have recurrent chest pain, I will see  him back in a year.     Noralyn Pick. Eden Emms, MD, [facility]  Electronically Signed    PCN/MedQ  DD: 08/31/2007  DT: 08/31/2007  Job #: [id number]

## 2011-04-09 NOTE — Assessment & Plan Note (Signed)
[facility]                            CARDIOLOGY OFFICE NOTE   [name], [name]                        MRN:          [id number]  DATE:09/15/2008                            DOB:          06/18/1946    [name] returns today for followup.  He continues to have exertional  dyspnea.  I talked to him about this at length.  I am convinced it is  due to his weight and his previous lung surgeries.  He has had bullous  emphysema and bilateral thoracotomies.   His LV function is normal.  He has a history of stenting of the LAD by  Dr. Juanda Chance which was done in April 2007.  He had a nonischemic Myoview  in October 2008.   His risk factors are well modified.  He was asking about cutting back on  his medications.  I told him that what we had him on his heart was the  bare minimum.   His review of system is remarkable for his dyspnea.  He is not having a  cough or sputum production.  He did get his flu shot.  He is not having  active wheezing.  He does bruise easily on his aspirin and Plavix.  Otherwise, negative.   Current meds include:  1. Plavix 75 a day.  2. Wellbutrin 300 a day.  3. Nortriptyline at night 2 tablets.  4. Protonix 40 a day.  5. Diovan 160 a day.  6. Lipitor 40 a day.  7. Lopressor 25 mg a day.  8. Aspirin a day.   PHYSICAL EXAMINATION:  GENERAL:  Remarkable for an overweight white male  in no distress.  VITAL SIGNS:  Weight is 230, blood pressure 120/80, pulse 78 and  regular, respiratory rate 14, afebrile.  HEENT:  Unremarkable.  NECK:  Carotids normal without bruit.  No lymphadenopathy, thyromegaly,  JVP elevation.  LUNGS:  Clear with no active wheezing.  Good diaphragmatic motion.  He  is status post bilateral thoracotomy scars.  HEART:  S1 and S2.  Normal heart sounds.  PMI normal.  ABDOMEN:  Benign.  Bowel sounds positive.  No AAA.  No bruit.  No  hepatosplenomegaly or hepatojugular reflux.  No tenderness.  EXTREMITIES:   Distal pulses are intact.  No edema.  NEUROLOGIC:  Nonfocal.  SKIN:  Warm and dry.  MUSCULOSKELETAL:  No muscular weakness.   EKG is normal.   IMPRESSION:  1. Coronary disease, previous stent to the left anterior descending.      Continue aspirin and Plavix and low-dose beta-blocker.  2. Hypertension, currently well controlled.  Continue current dose of      Diovan.  Low-salt diet.  3. Central obesity.  Decrease carbohydrate loads.  I specifically      talked to him about the bread and sandwiches that he has throughout      the day.  Target goal is to lose at least 10 pounds on followup      visit in 6 months.  4. Hypercholesterolemia.  Continue statin drug.  Last LDL was at  target with normal LFTs.  5. Dyspnea related to lung disease and bilateral thoracotomies with      bullous emphysema.  Followup primary care and/or pulmonary.     [name]. Todd Emms, MD, [name]  Electronically Signed    PCN/MedQ  DD: 09/15/2008  DT: 09/16/2008  Job #: [id number]

## 2011-04-12 NOTE — Discharge Summary (Signed)
[facility]  Patient:    TOWNSEND, CUDWORTH                        MRN: [id number] Adm. Date:  [id number] Disc. Date: 04/26/01 Attending:  Cameron Proud Dictator:   Sherrie George, P.A. CC:         Dyanne Carrel, M.D.   Discharge Summary  ADMISSION DIAGNOSIS:  Persistent right basilar pneumothorax.  DISCHARGE DIAGNOSIS:  Persistent right basilar pneumothorax.  PROCEDURES:  Right video-assisted thoracoscopy with placement of chest tubes, Apr 22, 2001.  BRIEF HISTORY:  The patient is a 65 year old white male medical patient of Dr. Becky Sax in [place], who referred the patient for evaluation of pneumothorax which occurred 20 years after having previously undergone bilateral thoracotomies, stapling of blebs and talc pleurodesis.  On February 03, 2001, he presented with increasing dyspnea and chest x-ray revealed a 20% pneumothorax on the right.  Followup films were recommended.  The last chest x-ray showed his pneumothorax to be stable between 15 and 20% in the basilar space.  He was seen by Dr. Edwyna Shell and after reviewing the studies, it was his opinion the best course would be to do a right video-assisted thoracoscopy and see if there was a leak which required chest tube placement.  A CT scan on February 17, 2001 revealed a possible right pleural ruptured cyst, which was possible source of the pneumothorax.  PAST MEDICAL HISTORY:  Includes a history of bilateral pneumothorax 20 years ago with a recurrence of the right pneumothorax.  Gastroesophageal reflux disease, history of tobacco use and history of colon cancer.  MEDICATIONS ON ADMISSION:  Included prevacid 20 mg q.d.  SOCIAL HISTORY:  He is married with two children, he is a Chartered certified accountant, he quit using tobacco in 1998.  He has been on and off until then, he used to smoke a pack a day for 30 years.  Denied alcohol use.  For further physical, see the dictated  note.  HOSPITAL COURSE:  The patient was admitted and taken to the operating room where he underwent right ______ .  A 0 degree scope was inserted and no bullae could be seen on the lung.  There was mild scarring of the lung and there were some adhesions to the diaphragm and you could see the pericardium, ______ going medially, there was no air leak seen there or source of an air leak.  A second trocar site was placed and another attempt to visualize different areas was performed.  Again, no air leak or source of air leak could be found.  The chest tubes were placed and Marcaine block was done.  The patient was transferred back to the postanesthesia care unit and then to the floor.  Since the surgery, he has had good progress with no air leaks.  The lungs showed good improvement and first chest tube was removed on May 31, the second one was to be removed today, April 25, 2001.  The patient continues to do well and has no pneumothorax in the morning.  It is Dr. Remo Lipps opinion that he would be ready for discharge to home.  DISCHARGE MEDICATIONS:  He was going to send him home on his preadmission medicines, which were: 1. Prevacid 20 mg q.d. 2. Darvocet-N 100 one to two p.o. q.4h. p.r.n.  FOLLOWUP:  He will return to see Dr. Edwyna Shell on Friday, June 14 at 3 p.m. with a chest x-ray from the  [facility].  DISCHARGE ACTIVITY:  Light to moderate, no lifting over 10 pounds, no driving or strenuous activity.  CONDITION ON DISCHARGE:  Improved. DD:  04/25/01 TD:  04/25/01 Job: [id number]

## 2011-04-12 NOTE — Discharge Summary (Signed)
NAMEBROOKLYN, JEFF NO.:  [id number]   MEDICAL RECORD NO.:  [id number]          PATIENT TYPE:  INP   LOCATION:  2024                         FACILITY:  [facility]   PHYSICIAN:  Arvilla Meres, M.D. [facility] OF BIRTH:  02-Oct-1946   DATE OF ADMISSION:  02/28/2006  DATE OF DISCHARGE:  03/02/2006                           DISCHARGE SUMMARY - REFERRING   DISCHARGE DIAGNOSES:  1.  Acute anterolateral myocardial infarction, status post stenting to the      left anterior descending with angioplasty to the first diagonal.      Residual nonobstructive coronary artery disease with an EF of 55%.  2.  Hyperglycemia with a new diagnosis of new onset diabetes.  3.  Hyperlipidemia.  4.  History of hypertension.  5.  Depression.  6.  Remote tobacco use.   PROCEDURE PERFORMED:  1.  Cardiac catheterization by Dr. Juanda Chance on February 28, 2006.  2.  Stenting to the LAD and angioplasty to the diagonal by Dr. Juanda Chance on      February 28, 2006.   PRIMARY CARE PHYSICIAN:  Bryan Lemma. Ehinger, M.D.   BRIEF HISTORY:  Mr. Krejci is a 65 year old white male who on the morning of  admission, at approximately 10:30:11 a.m. while cutting trees he developed  anterior chest pressure radiating into the left chest/left arm, associated  with shortness of breath and diaphoresis.  En route to [facility] via EMS  he received 10 mg of IV morphine, six sublingual nitroglycerins, O2, and  baby aspirin reducing his discomfort from a 10 to a 1 on a scale of 0-10.  EKG via EMS showed inferior T-wave inversions, J-point, and slight ST-  segment elevation in I and aVL, with ST-segment depression in leads V3  through V6.  Repeat EKG in the emergency room showed slight improvement.   This history is noted for hypertension, GERD, depression, recent foot  surgery by Dr. Lajoyce Corners, bilateral pneumothoraces in 1982 with subsequent  recurrent right pneumothorax, specifics unknown. Remote tobacco use.   LABORATORY DATA:  Chest  x-ray showed cardiomegaly, pulmonary vascular  congestion, and mild bibasilar atelectasis. Admission H&H was 13.1/37.8,  normal indices, platelet 245,000, WBC 10.4. Prior to discharge, H&H was 12.7  and 36.8, normal indices, platelet 238,000, and WBC 8.4. Admission PTT 27,  PT 13.2. Sodium 139, potassium 3.9, BUN 14, creatinine 1.1, glucose 141,  normal LFTs. Prior to discharge sodium was 140, potassium 4.4, BUN 16,  creatinine 1.3, glucose 126.  It was noted that during this admission  glucoses ranged from the 120s to the 190s. Hemoglobin A1c was slightly  elevated at 6.2. Initial CK-MB and relative index were negative. Troponin  was 0.22. The second CK was 567 with an MB of 74.2, relative index of 13.1,  and troponin of 18.24. The second CK was 603 with an MB of 78.2, relative  index of 13.0, and a troponin of 12.6.  Subsequent CK-MB and troponin were  declining. Fasting lipids on the 7th showed a total cholesterol of 184,  triglycerides 345, HDL 39, LDL 76.  TSH was 1.531, C-reactive protein was  elevated at 0.6.   HOSPITAL COURSE:  Mr. Basaldua was taken emergently to the cardiac  catheterization lab by Dr. Charlies Constable, __________ irregularities in the  RCA, 40% distal RCA, 30% distal left main, 50% mid circumflex. The first  diagonal had a 99% lesion and the LAD had a 95% proximal lesion just prior  to and involving the diagonal. Dr. Juanda Chance performed stenting to the LAD  reducing this to 0% and__________ to the diagonal initially reducing this to  70%, but with recoil to 70%. EF was 55% with anterior hypokinesis and slight  apical hypokinesis. He was entered into the [facility] stent study. By March 01, 2006, he was doing well, catheterization site was intact. Dr. Gala Romney  adjusted his medications and felt that he had a new diagnosis of type 2  diabetes with hyperglycemia, elevated hemoglobin A1c. Cardiac rehab assisted  with education and ambulation. By March 02, 2006, the patient was  ambulating  without difficulty, was felt that he could be discharged home. Prior to  discharge he will watch educational videos in regards to myocardial  infarction and diabetes.   DISPOSITION:  He is discharged home with a low-fat, low-salt,  low-  cholesterol, and ADA diet. His activities are restricted in regards to  lifting, driving, sexual activity, or heavy exertion for two weeks. His  wound are is per supplemental discharge instruction sheet. He was asked to  bring all medications to all appointments.  New medications include aspirin  325 mg daily, Plavix 75 mg p.o. daily, Toprol XL 25 mg daily, Lipitor 80  mg  q.h.s., Altace 2.5 mg daily, nitroglycerin 0.4 p.r.n. He is given permission  to continue Prilosec 20 mg daily and Wellbutrin 300 mg daily. He was asked  to call Dr. Fabio Bering office on Monday to arrange a two-week appointment as  well as to call Dr. Manus Gunning, his primary care physician, to arrange his two-  week appointment. He will be referred outpatient cardiac rehab as well as  diabetes education. He will need bloodwork in six to eight weeks in regards  to potassium and lipids since Lipitor was initiated. [facility] will  be contacting him for follow-up appointment. Outpatient referral to diabetes  education will be continued and Dr. Manus Gunning will consult a three-month trial  of diet and weight loss for diabetes versus metformin.   Discharge time was less than 30 minutes.      Joellyn Rued, P.A. [facility]      Arvilla Meres, M.D. [facility]  Electronically Signed    EW/MEDQ  D:  03/02/2006  T:  03/02/2006  Job:  [id number]   cc:   Charlton Haws, M.D.  [address]  [address]  [place]  [place] [address]   Bryan Lemma. Manus Gunning, M.D.  Fax: [phone]   Nadara Mustard, MD  Fax: [phone]

## 2011-04-12 NOTE — H&P (Signed)
[name], [name]                          MRN: [id number] Adm. Date:  04/22/01 Attending:  D. Karle Plumber, M.D. Dictator:   Durenda Age, P.A.-C. CC:         [name], M.D.   History and Physical  DATE OF BIRTH:  [birth year]  CHIEF COMPLAINT:  Lung lesion.  HISTORY OF PRESENT ILLNESS:  The patient is a very pleasant 65 year old white male referred by Dr. Manus [name] for evaluation of a pneumothorax, which recurred after 20 years of stable condition.  About 20 years ago, he developed bilateral pneumothoraces requiring bilateral thoracotomies with stapling of left, and possibly talc pleurodesis.  On February 03, 2001, he presented with increasing dyspnea.  A chest x-ray revealed 20% pneumothorax on the right.  Followup films were recommended.  The last chest x-ray showed his pneumothorax to be stable between 15% and 20% in degree, at the basilar space.  Dr. Edwyna [name], now recommends to proceed with surgical repair.  He will undergo right VATS, and the area will be scoped, and the leak will be repaired with a chest tube placement.  Of note, the CT on February 17, 2001, revealed a possible right pleural ruptured cyst, which might have been the cause of this pneumothorax according to the radiology report.  As well, in the right posterior aspect of the right lung apex, there is a pleural cyst - bleb of less than 2 cm visible.  The patient denies any significant cough or sputum production.  He does have shortness of breath on exertion, but not at rest.  As well, he occasionally experiences left-sided chest pain, which is possibly attributed to the old thoracotomy site.  No fever or chills.  No unexplained weight loss.  No GERD symptoms.  No shortness of breath.  No paroxysmal nocturnal dyspnea or anginal symptoms.  No arrhythmias or peripheral edema.  No symptoms or TIA, CVA, or amaurosis fugax. No pneumonia, PE, or  DVT.  PAST MEDICAL HISTORY: 1. History of bilateral pneumothorax 20 years ago with recurrent right    pneumothorax. 2. GERD. 3. Remote history of tobacco abuse. 4. History of colon cancer.  PAST SURGICAL HISTORY:  Status post bilateral thoracotomy with stapling of the blebs in 1982 by Dr. Nedra [name].  MEDICATIONS:  Prevacid 20 mg q.d.  ALLERGIES:  CODEINE.  REVIEW OF SYSTEMS:  See HPI and past medical history for significant positives.  FAMILY HISTORY:  Mother died of CVA.  Father died of MI.  SOCIAL HISTORY:  Married, two children.  He is a Chartered certified accountant.  He quit the use of tobacco in 1998 for the last time.  The patient had been "on and off" until then.  He used to smoke about one-pack-a-day of tobacco for about 30 years. He denies any alcohol intake.  PHYSICAL EXAMINATION:  GENERAL:  A well-developed and well-nourished, slightly obese 65 year old white male in no acute distress, alert and oriented x 3.  VITAL SIGNS:  Blood pressure 110/70, pulse 64, respirations 16.  HEENT:  Normocephalic and atraumatic.  PERRLA, EOMI.  Funduscopic exam within normal limits.  NECK:  Supple, no JVD, bruits, or lymphadenopathy.  CHEST:  Symmetrical on inspiration.  LUNGS:  Clear to auscultation, although there are decreased breath sounds on the right side, consistent with a pneumothorax.  There is a well-healed bilateral thoracotomy site.  CARDIOVASCULAR:  Regular rate and rhythm.  No murmurs, rubs, or gallops.  ABDOMEN:  Soft, nontender, bowel sounds x 4.  No masses or bruits.  GU AND RECTAL:  Deferred.  EXTREMITIES:  No clubbing, cyanosis, or edema.  No ulcerations.  Temperature warm.  Peripheral pulses and carotids 2+ bilaterally.  Femoral, popliteal, dorsalis pedis, and posterior tibialis 2+ bilaterally.  NEUROLOGIC:  Nonfocal.  DTRs 2+ bilaterally.  Gait steady.  Motor strength 5/5.  ASSESSMENT AND PLAN:  Right pneumothorax for right VATS and closure of the air leak by Dr.  Edwyna [name].  Dr. Edwyna [name] has seen and evaluated this patient prior to the admission, and has explained the risks and benefits involved in the procedure, and the patient has agreed to continue. DD:  04/21/01 TD:  04/21/01 Job: [id number]

## 2011-04-12 NOTE — H&P (Signed)
NAME:  [name], [name] NO.:  [id number]   MEDICAL RECORD NO.:  [id number]          PATIENT TYPE:  EMS   LOCATION:  [facility]                         FACILITY:  [facility]   PHYSICIAN:  Charlton Haws, M.D.     DATE OF BIRTH:  08/08/1946   DATE OF ADMISSION:  02/28/2006  DATE OF DISCHARGE:                                HISTORY & PHYSICAL   SUMMARY OF HISTORY:  [name] is a 65 year old white male who was in his  usual state of health until approximately 10:30-11:00 this morning when he  developed an anterior chest pressure radiating to the left side of his chest  and into his left arm associated with diaphoresis and shortness of breath.  This occurred while he was doing heavy yard work such as cutting down tree  limbs.  He was transported via EMS to [facility] and en route for 10/10  chest discomfort he received a total of 10 mg of IV morphine, six sublingual  nitroglycerin, O2, and baby aspirin.  This reduced his discomfort to 1 on a  scale of 0-10.  EKG by EMS showed normal sinus rhythm, inferior T-wave  inversion, J-point and slight ST segment elevation in 1 and aVL, ST segment  depression in V3-V6, delayed R-wave.  EKG repeated in the emergency room  continued to show ST depression in leads 3 and aVF.  The ST segment  depression in leads V3-V6 had essentially resolved and the ST segment  elevation in 1 and aVL had improved, but not entirely resolved.  These were  new findings compared to an EKG in May 2002.   PAST MEDICAL HISTORY:   ALLERGIES:  CODEINE and TAPE.   MEDICATIONS PRIOR TO ADMISSION:  Something for depression and hypertension.  He nor his wife know the names of the medications, dosages, or frequencies.   PAST MEDICAL HISTORY:  1.  Hypertension.  2.  GERD.  3.  Depression.  4.  Foot surgery on his left foot by Dr. Lajoyce Corners approximately four weeks ago      as an outpatient.  5.  In 1982 he suffered bilateral pneumothoraces with subsequent recurrent  right pneumothoraces and has undergone surgery by Dr. Nedra Hai.  Specifics      unknown.  6.  He denies any history of diabetes, COPD, bleeding dyscrasias, or thyroid      dysfunction.   SOCIAL HISTORY:  He resides in [place] with his wife.  He is a Chartered certified accountant.  He quit smoking in 1998.  Prior to that he smoked one pack per day for 30  years.  He states he might have one to two beers every couple weeks.  He  denies any drug use, herbal medications, specific diet, or exercise.   Specifics of his family history are unavailable at this time, although the  patient states there is family history of coronary artery disease.   REVIEW OF SYSTEMS:  Notable for a 15 pound weight gain approximately in the  last two months, reading glasses, dyspnea on exertion for the preceding six  months, dry cough, positive snoring.  Has never been evaluated for OSA,  depression, GERD in addition to the above.   PHYSICAL EXAMINATION:  GENERAL:  Well-nourished, well-developed, slightly  obese white male in no apparent distress.  VITAL SIGNS:  Blood pressure 153/98, pulse 85, respirations 23, 100%  saturation on 2 L.  HEENT:  Unremarkable.  NECK:  Supple without thyromegaly, adenopathy, JVD, or carotid bruits.  CHEST:  Symmetrical excursion.  Lungs sounds are diminished and he has some  right basilar crackles.  HEART:  PMI is not displaced.  Regular rate and rhythm.  Normal S1 and S2.  Do not appreciate any murmurs, rubs, clicks, or gallops.  SKIN/INTEGUMENT:  Intact without rashes or lesions.  ABDOMEN:  Obese.  Bowel sounds present without organomegaly, masses, or  tenderness.  EXTREMITIES:  Negative clubbing, cyanosis, edema.  Peripheral pulses are  symmetrical and intact.  His left dorsal foot around the smaller toes is  slightly tender and edematous with a well healed incision.  NEUROLOGIC:  Unremarkable.  MUSCULOSKELETAL:  Otherwise unremarkable.   Chest x-ray and all laboratories are pending.    IMPRESSION:  1.  Acute coronary syndrome.  2.  History of hypertension.  3.  Obesity.  4.  Remote tobacco use.  5.  History of gastroesophageal reflux disease and hypertension.  6.  History of bilateral pneumothoraces in the remote past.   DISPOSITION:  Dr. Eden Emms reviewed the patient's history, spoke with and  examined the patient, and agrees with the above.  [name] after the  benefits, risks, and procedure of cardiac catheterization have been  explained he will be taken to the cardiac catheterization laboratory for  further evaluation of his discomfort and EKG changes.  Further plans will be  pending on future findings.      Joellyn Rued, P.A. [facility]    ______________________________  Charlton Haws, M.D.    EW/MEDQ  D:  02/28/2006  T:  02/28/2006  Job:  [id number]   cc:   Bryan Lemma. Manus Gunning, M.D.  Fax: [phone]

## 2011-04-12 NOTE — Op Note (Signed)
[place]  Patient:    [name], [name]                        MRN: [id number] Adm. Date:  [id number] Attending:  Cameron Proud                           Operative Report  PREOPERATIVE DIAGNOSIS:  Persistent right basilar pneumothorax.  POSTOPERATIVE DIAGNOSIS:  Persistent right basilar pneumothorax.  PROCEDURE:  Right video-assisted thoracic surgery (VATS), insertion of right chest tube.  SURGEON:  D. Karle Plumber, M.D.  FIRST ASSISTANT:  Maxwell Marion, R.N.F.A.  ANESTHESIA:  General anesthesia.  CLINICAL NOTE:  This patient had previous bilateral thoracotomies for bullous disease 20 years ago and now recurred with a right basilar pneumothorax, which CT scan revealed several new bullae and a medial pneumothorax as well as a basilar pneumothorax.  This was persistent over several months, and it was decided to use a VATS approach to explore this patients pneumothorax and be sure there were no bullae that could be obliterated, and then to leave chest tubes in.  DESCRIPTION OF PROCEDURE:  After general anesthesia and percutaneous insertion of all monitoring lines, the patient was turned to the right lateral thoracotomy position, was prepped and draped in the usual sterile manner.  Two trocar sites were made in the anterior and posterior axillary line at the seventh intercostal space.  The posterior axillary line was opened first and went into this pneumothorax space.  A 0 degree scope was inserted.  The lung could be seen with no bullae seen on the lung.  There was some mild scarring of the lung, and there were some adhesions to the diaphragm.  Then immediately you could see that the pericardium in areas had a space going medially.  There was probably a persistent air leak medially that came down the pericardium and then caused the basilar pneumothorax, but no definite bullae or air leak could be seen.  For this reason, another  trocar site was made anteriorly in the midaxillary line, and a 0 degree scope was again inserted and exploration was again carried out, and no other bullae could be seen, so the scope was then switched back to the posterior trocar site and then under direct vision, a tube was directed into the space medially, up the pericardium.  It was sutured in place with 0 silk, and a posterior tube was placed through the posterior trocar site, sutured in place with 0 silk.  Marcaine block was done in the usual fashion.  The patient was returned to recovery in stable condition. DD:  04/22/01 TD:  04/22/01 Job: [id number]

## 2011-04-12 NOTE — Assessment & Plan Note (Signed)
[facility]                              CARDIOLOGY OFFICE NOTE   ROAN, Todd                        MRN:          [id number]  DATE:08/29/2006                            DOB:          1945-12-17    [name] returns today for followup.  He is status post anterior wall MI  with stenting of the LAD.  He has been compliant with his aspirin and  Plavix.  He finished rehab about 3 weeks ago.  I believe his index event was  on February 28, 2006.   He has well-preserved LV function.  He is on 40 of Lipitor a day.  He needs  a follow up lipid and liver profile in 3 months.   He denies any significant chest pain.  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  He is on Diovan for blood pressure with good  control.   He does notice that he is bruising easier on the aspirin and Plavix.   EXAMINATION:  He looks well.  The blood pressure is 128/79, pulse 72 and  regular.  Lungs are clear.  Carotids are normal.  There is no lymphadenopathy.  There is no thyromegaly.  Pupils are equal, round, reactive to light and accommodation.  There is S1, S2 with normal heart sounds.  ABDOMEN:  Benign.  LOWER EXTREMITIES:  Intact pulses, no edema.   MEDICATIONS:  Listed in the chart.   IMPRESSION:  Stable stenting of the left anterior descending.  Good left  ventricular function.  Continue aspirin and Plavix.  Follow up lipid and  liver profile on 40 of Lipitor in 3 months.  I will see him back in 6  months.  He is not smoking and overall his blood pressure is well-controlled  on Diovan.   I think he is having good progress and he seems to be keeping up his  exercise, even after rehab is finished.            ______________________________  Noralyn Pick. Eden Emms, MD, [place]     PCN/MedQ  DD:  08/29/2006  DT:  09/01/2006  Job #:  [id number]

## 2011-04-12 NOTE — Assessment & Plan Note (Signed)
[facility]                            CARDIOLOGY OFFICE NOTE   [name], [name]                        MRN:          [id number]  DATE:03/04/2007                            DOB:          [birth year]    [name] returns today for followup. He has had previous stenting of the  LAD. He is doing well. He denies significant chest pain, PND or  orthopnea.   He stopped smoking. His risk factors are otherwise well modified.   A 10-point review of systems is negative except for diaphoresis. I guess  in January his Toprol was stopped by Dr. Manus Gunning. However, he has not  had any significant change in what he thinks is unusually easy sweating.   I think it may be more related to his Wellbutrin and nortriptyline.  These were given to him for depression and not smoking cessation. I told  him to talk to Dr. Manus Gunning about this. His heart rate was actually high  today at 90. Given his coronary disease, he needs to be on a beta-  blocker.  Since it made no change in his diaphoretic symptoms, I think  it is fine for him to go back on Toprol 25 a day.   His blood pressure otherwise is doing well on Diovan.   MEDICATIONS:  1. Aspirin a day.  2. Plavix 75 a day.  3. Toprol 25 a day.  4. Now Lipitor 40 a day.  5. Wellbutrin 350 a day.  6. Nortriptyline 2 tablets at night.  7. Protonix 40 a day.  8. Diovan 160 a day.  9. Advair as needed.   TODAY'S EXAMINATION:  His blood pressure is 125/75, pulse 90.  HEENT: Is normal. Carotids are normal without bruits. There is no  thyromegaly.  LUNGS:  Clear.  There is an S1, S2 with normal heart sounds.  ABDOMEN: Is benign.  LOWER EXTREMITIES: Intact pulses. No edema.  NEURO: Nonfocal.  SKIN: Warm and dry.   IMPRESSION:  Stable history of coronary artery disease, stenting of the  left anterior descending artery in April of 2007. Followup in 6 months  with possible Myoview. The patient is back to work doing 12 hour  days.  Risk factors are well modified. I will call Dr. Randel Books office to get  his most recent lipid and liver profile. He will continue his statin  drug.   His symptoms of diaphoresis are somewhat nondescript. I am not sure what  to make of them. He will followup with Dr. Manus Gunning regarding tapering  his centrally acting agents and will restart his Toprol at 25 mg a day.  He has known coronary disease and his pulse currently is too high.   He will continue his aspirin and Plavix since he has a drug-eluting  stent. He knows to call us before anyone stops his Plavix medicine since  his stent is still under a year old.   I will see him back in 6 months prior to his stress test.     [name], MD, [facility]  Electronically Signed    PCN/MedQ  DD: 03/04/2007  DT: 03/04/2007  Job #: [id number]

## 2011-04-12 NOTE — Assessment & Plan Note (Signed)
[facility]                            CARDIOLOGY OFFICE NOTE   MOURAD, CWIKLA                          MRN:          [id number]  DATE:01/24/2009                            DOB:          11-17-1946    I received a phone call today from Dr. Blair Heys regarding Mr.  Dahm.  He has known coronary artery disease.  He had a TAXUS stent  placed in the LAD on February 28, 2006.  This was a complex intervention  with a bifurcation diagonal lesion.   The patient had a nonischemic Myoview study in October 2008.  Because of  the complex nature of his intervention, we have wanted to keep him on  Plavix indefinitely so long as there are no contraindications.  Dr.  Manus Gunning indicated that the patient was on Protonix and that he had  gotten a letter from [facility].  I told Dr. Manus Gunning that the relative  indication for Protonix was probably the weaker one.  I did note that  the patient has an iron deficiency anemia on previous labs that were  drawn by Dr. Manus Gunning in September 2009.  Given this and the fact that  the patient is on Protonix, I would suspect that he needs an EGD and  colonoscopy.  If he has these and there is no significant pathology, I  would stop his Protonix.  I also told him that there is less literature  regarding Protonix as opposed to omeprazole with interaction with  Plavix.   I am due to see Jazir back I believe in April.  Again, I think it would  be good for the patient to stop his Protonix rather than stopping his  Plavix given his known complex LAD diagonal coronary disease.  He has  done well since his TAXUS stent placement and I would not want to lower  his antiplatelet therapy at this time.     Noralyn Pick. Eden Emms, MD, [facility]  Electronically Signed    PCN/MedQ  DD: 01/24/2009  DT: 01/25/2009  Job #: [id number]

## 2011-04-12 NOTE — Cardiovascular Report (Signed)
NAMEGUNTER, CONDE NO.:  [id number]   MEDICAL RECORD NO.:  [id number]          PATIENT TYPE:  INP   LOCATION:  2024                         FACILITY:  [facility]   PHYSICIAN:  Charlies Constable, M.D. [facility] DATE OF BIRTH:  05/17/1946   DATE OF PROCEDURE:  02/28/2006  DATE OF DISCHARGE:                              CARDIAC CATHETERIZATION   CLINICAL HISTORY:  Mr. [name] is 65 years old and works at [employer].  He has had  no prior history of known heart disease, although he has been a smoker and  has hypertension and a family history of heart disease.  He had the onset of  chest pain this morning at work and came to the emergency room via EMS.  His  EKG was transmitted via ambulance and suggested a lateral infarction.  He  was seen promptly by Dr. Charlton Haws and Joellyn Rued, P.A. in the  emergency department and transferred to the catheter lab for catheterization  and possible intervention.  He was enrolled in the Horizon trial.   The procedure was performed by the right femoral artery using arterial  sheath and 6-French preformed coronary catheters.  We performed a diagnostic  study on the right coronary artery and then used a CLS #4 6-French guiding  catheter with side holes for the left coronary injections.  After completion  of the diagnostic study, we recognized there was a tight lesion in the  proximal LAD and the diagonal branch, bifurcation lesion.  Preparation was  made for coronary intervention.  The patient was given 600 mg of Plavix and  20 mg of Pepcid.  He was enrolled in the Horizon and randomized to Angiomax  bolus and infusion.   We initially passed a wire down the LAD without much difficulty.  We had a  great deal of difficulty advancing a wire into the diagonal branch and tried  multiple wires including a [employer] 4.5.  This was unsuccessful so  we elected to dilate the LAD with a 3.0 x 20 mm balloon.  We then tried  again to crossing the diagonal  branch since the flow was somewhat better  after dilating the LAD.  This again was unsuccessful.  We then dilated the  LAD with a 3.5 x 20 mm balloon performing one inflation up to 8 atmospheres  for 30 seconds.  This identified the ostial lesion of the diagonal a little  more clearly and we were finally able to advance a PT2 light support wire  into the diagonal branch.  We dilated the diagonal branch with a 2.75 x 50  mm Maverick performing two inflations of 8 atmospheres for 30 seconds.  Before we did this, we exchanged for a 7-French system since we thought we  might need to do a kissing balloon at the end of the procedure.  We then  deployed a 3.0 x 24 mm Taxus stent across the diagonal branch and deployed  this with one inflation of 16 atmospheres for 30 seconds.  The diagonal  branch had good flow and we had used 500 cc of  contrast.  So we elected not  to redilate the diagonal branch.  We did perform a postdilatation of the  stent with a 3.5 x 20 mm Quantum Maverick performing one inflation to 15  atmospheres by 30 seconds.  Final diagnostic study was then performed with a  guiding catheter.   The patient tolerated the procedure well and left the laboratory in  satisfactory condition.   RESULTS:  The left main coronary artery:  The left main coronary artery had  a 30% distal stenosis.   The left anterior descending artery:  The left anterior descending artery  gave rise to four diagonal branches, four septal perforators, and a diagonal  branch.  There was 95% stenosis in the proximal LAD just before the diagonal  branch.  There was 99% stenosis at the ostium of the diagonal branch with  TIMI II flow.   The circumflex artery:  The circumflex artery is a large vessel that gave  rise to two marginal branches and two posterolateral branches.  There was  50% narrowing in the midvessel after the two marginal branches.   The right coronary artery:  The right coronary artery is a  small to moderate-  size vessel that gave rise to a conus branch, two right ventricular branches  and a posterior descending branch.  The right coronary artery was irregular  and there was 40% narrowing in this distal portion.   LEFT VENTRICULOGRAM:  The left ventriculogram performed in the RAO  projection showed hypokinesis of the anterior lateral and slight hypokinesis  of the apex.  The estimated ejection fraction was 55%.   Following PDC of the diagonal branch of the LAD, this vessel improved from  99% to 30% and then following stenting had residual 70% narrowing with TIMI  III flow.  Following stenting of the LAD lesion, the stenosis improved from  95% to 0% with TIMI III flow before and after intervention.   CONCLUSION:  1.  Acute lateral wall myocardial infarction with 30% narrowing in the left      main coronary artery, 95% stenosis in the proximal left anterior      descending artery and 99% stenosis in the first diagonal branch with      TIMI II flow, 50% narrowing in the mid-right coronary artery and 40%      narrowing in the distal right coronary artery with anterolateral wall      hypokinesis and slight apical wall hypokinesis.  2.  Successful percutaneous coronary intervention of a bifurcation lesion in      the left anterior descending artery and diagonal branch with improvement      in the diagonal branch stenosis from 99% to 70% with improvement in flow      from TIMI II to TIMI III flow and improvement in the left anterior      descending artery stenosis from 95% to 0% with improvement in the flow      from TIMI III to TIMI III flow.   DISPOSITION:  The patient returned to post anesthesia for further  observation.  The patient had the onset of chest pain at 10:50 and arrived  in the emergency department by ambulance at 12:20.  The first ECG at [facility] was obtained at 12:23.  The patient arrived at the laboratory  at 12:38 and the first balloon inflation  was at 13:30.  This gave a door  balloon time of 70 minutes and a reperfusion time of 2 hours and  40 minutes.  The patient will be targeted for discharge on  day two and will be kept on  Plavix for at least a year.           ______________________________  Charlies Constable, M.D. [facility]     BB/MEDQ  D:  02/28/2006  T:  03/01/2006  Job:  [id number]   cc:   Charlton Haws, M.D.  [address]  [address]  [place]  [place] [address]   Cardiopulmonary Lab   Bryan Lemma. Manus Gunning, M.D.  Fax: [phone]

## 2011-05-02 ENCOUNTER — Telehealth: Payer: Self-pay | Admitting: Cardiovascular Disease

## 2011-05-02 MED ORDER — ATORVASTATIN CALCIUM 80 MG PO TABS: 80.0000 mg | ORAL_TABLET | Freq: Every day | ORAL | Status: DC

## 2011-05-02 NOTE — Telephone Encounter (Signed)
torvastatin 80 [facility]

## 2011-06-04 ENCOUNTER — Other Ambulatory Visit: Payer: Self-pay | Admitting: Cardiovascular Disease

## 2011-08-31 ENCOUNTER — Other Ambulatory Visit: Payer: Self-pay | Admitting: Cardiovascular Disease

## 2011-09-03 LAB — HEMOGLOBIN AND HEMATOCRIT, BLOOD
HCT: 34.5 — ABNORMAL LOW
Hemoglobin: 11.9 — ABNORMAL LOW

## 2011-09-03 LAB — BASIC METABOLIC PANEL
Calcium: 9.6
Chloride: 108
Creatinine, Ser: 1.1
GFR calc Af Amer: >60
GFR calc non Af Amer: >60

## 2011-10-01 ENCOUNTER — Encounter: Payer: Self-pay | Admitting: Cardiovascular Disease

## 2011-10-18 ENCOUNTER — Ambulatory Visit: Payer: BC Managed Care – PPO | Admitting: Cardiovascular Disease

## 2011-11-14 ENCOUNTER — Encounter: Payer: Self-pay | Admitting: *Deleted

## 2011-11-14 ENCOUNTER — Ambulatory Visit (INDEPENDENT_AMBULATORY_CARE_PROVIDER_SITE_OTHER): Payer: BC Managed Care – PPO | Admitting: Cardiovascular Disease

## 2011-11-14 DIAGNOSIS — I2510 Atherosclerotic heart disease of native coronary artery without angina pectoris: Secondary | ICD-10-CM

## 2011-11-14 DIAGNOSIS — I10 Essential (primary) hypertension: Secondary | ICD-10-CM

## 2011-11-14 DIAGNOSIS — E7800 Pure hypercholesterolemia, unspecified: Secondary | ICD-10-CM

## 2011-11-14 DIAGNOSIS — F329 Major depressive disorder, single episode, unspecified: Secondary | ICD-10-CM

## 2011-11-14 NOTE — Assessment & Plan Note (Signed)
Cholesterol is at goal.  Continue current dose of statin and diet Rx.  No myalgias or side effects.  F/U  LFT's in 6 months. No results found for this basename: LDLCALC   Reviewed labs from primary done 10/01/11  LDL 75 with normal LFT;s

## 2011-11-14 NOTE — Patient Instructions (Signed)
Your physician wants you to follow-up in: YEAR WITH DR NISHAN  You will receive a reminder letter in the mail two months in advance. If you don't receive a letter, please call our office to schedule the follow-up appointment.  Your physician recommends that you continue on your current medications as directed. Please refer to the Current Medication list given to you today. 

## 2011-11-14 NOTE — Progress Notes (Signed)
Patient ID: [name], male   DOB: 12-15-1945, 65 y.o.   MRN: [id number] 65 yo previously seen by GD.  Stent LAD in 2007.  Quit smoking in 2000.  Elevated lipids on statin with recent labs showing LDL 75.  HTN well controlled recent increase in beta blocker.  Had GI bleed post colonoscopy 2/12 with elevated enzymes.  IP myovue normal with no ischemia.  Changed to Effient because of chronic need for H2 blocker.  Having excess sweating and dry mouth.  No angina, dyspnea palpitations.  Compliant with meds  Reviewed labs from Dr Manus Gunning look good.  Retiring from [employer] this month which is a stress relief  ROS: Denies fever, malais, weight loss, blurry vision, decreased visual acuity, cough, sputum, SOB, hemoptysis, pleuritic pain, palpitaitons, heartburn, abdominal pain, melena, lower extremity edema, claudication, or rash.  All other systems reviewed and negative  General: Affect appropriate Healthy:  appears stated age HEENT: normal Neck supple with no adenopathy JVP normal no bruits no thyromegaly Lungs clear with no wheezing and good diaphragmatic motion Heart:  S1/S2 no murmur,rub, gallop or click PMI normal Abdomen: benighn, BS positve, no tenderness, no AAA no bruit.  No HSM or HJR Distal pulses intact with no bruits No edema Neuro non-focal Skin warm and dry No muscular weakness   Current Outpatient Prescriptions  Medication Sig Dispense Refill  . aspirin EC 81 MG tablet Take 81 mg by mouth daily.        Marland Kitchen atorvastatin (LIPITOR) 80 MG tablet Take 40 mg by mouth daily.        . budesonide-formoterol (SYMBICORT) 80-4.5 MCG/ACT inhaler Inhale 2 puffs into the lungs as needed.        Marland Kitchen buPROPion (WELLBUTRIN XL) 300 MG 24 hr tablet Take 300 mg by mouth daily.        Marland Kitchen imipramine (TOFRANIL) 50 MG tablet Take 50 mg by mouth at bedtime.        . metFORMIN (GLUCOPHAGE-XR) 500 MG 24 hr tablet Take 1 tablet by mouth Daily.      . metoprolol (LOPRESSOR) 50 MG tablet Take 1 tablet by mouth  Daily.      . Multiple Vitamin (MULTIVITAMIN) tablet Take 1 tablet by mouth daily.        . nitroGLYCERIN (NITROSTAT) 0.4 MG SL tablet Place 0.4 mg under the tongue every 5 (five) minutes as needed.        . pantoprazole (PROTONIX) 40 MG tablet Take 40 mg by mouth daily.        . prasugrel (EFFIENT) 10 MG TABS Take 10 mg by mouth daily.        . valsartan (DIOVAN) 160 MG tablet Take 160 mg by mouth daily.          Allergies  Codeine  Electrocardiogram:  NSR 77 normal ECG  Assessment and Plan

## 2011-11-14 NOTE — Assessment & Plan Note (Signed)
Stable with no angina and good activity level.  Continue medical Rx.  Myovue 2/12 normal.  ON Effient.  Chronic GERD on Protonix

## 2011-11-14 NOTE — Assessment & Plan Note (Signed)
Having dry mouth and excessive sweating.  ? Secondary to antidepressant.  He is retiring from [employer] this month which was one of his biggest stressors. ? F/U primary and wean off MAO or try different med

## 2011-11-14 NOTE — Assessment & Plan Note (Signed)
Well controlled.  Continue current medications and low sodium Dash type diet.    

## 2011-11-22 NOTE — Progress Notes (Signed)
Addended by: Micki Riley C on: 11/22/2011 02:05 PM   Modules accepted: Orders

## 2012-01-27 ENCOUNTER — Other Ambulatory Visit: Payer: Self-pay | Admitting: Cardiovascular Disease

## 2012-04-06 ENCOUNTER — Telehealth: Payer: Self-pay | Admitting: Cardiovascular Disease

## 2012-04-06 MED ORDER — ATORVASTATIN CALCIUM 80 MG PO TABS: 40.0000 mg | ORAL_TABLET | Freq: Every day | ORAL | Status: DC

## 2012-04-06 MED ORDER — VALSARTAN 160 MG PO TABS: 160.0000 mg | ORAL_TABLET | Freq: Every day | ORAL | Status: DC

## 2012-04-06 MED ORDER — METOPROLOL TARTRATE 50 MG PO TABS: 50.0000 mg | ORAL_TABLET | Freq: Every day | ORAL | Status: DC

## 2012-04-06 NOTE — Telephone Encounter (Addendum)
New message:  Pt needs a 2 week supply of the three medications [facility]  Phar number [phone].  Lipitor, diovan, metoprolol.

## 2012-04-07 ENCOUNTER — Other Ambulatory Visit: Payer: Self-pay

## 2012-04-07 MED ORDER — ATORVASTATIN CALCIUM 80 MG PO TABS: 40.0000 mg | ORAL_TABLET | Freq: Every day | ORAL | Status: DC

## 2012-04-07 MED ORDER — PRASUGREL HCL 10 MG PO TABS: 10.0000 mg | ORAL_TABLET | Freq: Every day | ORAL | Status: DC

## 2012-04-07 NOTE — Telephone Encounter (Signed)
..   Requested Prescriptions   Signed Prescriptions Disp Refills  . atorvastatin (LIPITOR) 80 MG tablet 90 tablet 2    Sig: Take 0.5 tablets (40 mg total) by mouth daily.    Authorizing Provider: Wendall Stade    Ordering User: Kip Kautzman M  Resent to [facility], used wrong pham the first time

## 2012-04-07 NOTE — Telephone Encounter (Signed)
..   Requested Prescriptions   Signed Prescriptions Disp Refills  . prasugrel (EFFIENT) 10 MG TABS 90 tablet 3    Sig: Take 1 tablet (10 mg total) by mouth daily.    Authorizing Provider: Wendall Stade    Ordering User: Christella Hartigan, Zaila Crew Judie Petit

## 2012-04-27 ENCOUNTER — Other Ambulatory Visit: Payer: Self-pay | Admitting: *Deleted

## 2012-04-27 MED ORDER — ATORVASTATIN CALCIUM 80 MG PO TABS: 40.0000 mg | ORAL_TABLET | Freq: Every day | ORAL | Status: DC

## 2013-03-19 ENCOUNTER — Other Ambulatory Visit: Payer: Self-pay | Admitting: *Deleted

## 2013-05-06 ENCOUNTER — Telehealth: Payer: Self-pay

## 2013-05-06 ENCOUNTER — Other Ambulatory Visit: Payer: Self-pay

## 2013-05-06 MED ORDER — PRASUGREL HCL 10 MG PO TABS: 10.0000 mg | ORAL_TABLET | Freq: Every day | ORAL | Status: DC

## 2013-05-06 NOTE — Telephone Encounter (Signed)
pt called to rqst refill for effient. pt has not been seen since 2012. pt has been out of med for 30 days.will forward to [facility] nurse Christine.pt will call to back to schedule an appt

## 2013-05-06 NOTE — Telephone Encounter (Signed)
Ok to fill pt effient 90 day supply with no refills per Scherrie Bateman, LPN. Pt notified that he must call back to schedule an appt asap.

## 2013-07-14 ENCOUNTER — Other Ambulatory Visit: Payer: Self-pay | Admitting: *Deleted

## 2013-07-14 MED ORDER — PRASUGREL HCL 10 MG PO TABS: 10.0000 mg | ORAL_TABLET | Freq: Every day | ORAL | Status: DC

## 2013-08-23 ENCOUNTER — Other Ambulatory Visit: Payer: Self-pay | Admitting: *Deleted

## 2013-08-23 ENCOUNTER — Encounter: Payer: Self-pay | Admitting: Cardiovascular Disease

## 2013-08-23 ENCOUNTER — Ambulatory Visit (INDEPENDENT_AMBULATORY_CARE_PROVIDER_SITE_OTHER): Payer: 59 | Admitting: Cardiovascular Disease

## 2013-08-23 VITALS — BP 142/83 | HR 78 | Resp 20 | Ht 70.0 in | Wt 223.0 lb

## 2013-08-23 DIAGNOSIS — I10 Essential (primary) hypertension: Secondary | ICD-10-CM

## 2013-08-23 DIAGNOSIS — I2510 Atherosclerotic heart disease of native coronary artery without angina pectoris: Secondary | ICD-10-CM

## 2013-08-23 DIAGNOSIS — E1169 Type 2 diabetes mellitus with other specified complication: Secondary | ICD-10-CM | POA: Insufficient documentation

## 2013-08-23 DIAGNOSIS — E7800 Pure hypercholesterolemia, unspecified: Secondary | ICD-10-CM

## 2013-08-23 DIAGNOSIS — F329 Major depressive disorder, single episode, unspecified: Secondary | ICD-10-CM

## 2013-08-23 DIAGNOSIS — R0600 Dyspnea, unspecified: Secondary | ICD-10-CM

## 2013-08-23 DIAGNOSIS — E119 Type 2 diabetes mellitus without complications: Secondary | ICD-10-CM

## 2013-08-23 MED ORDER — METOPROLOL TARTRATE 50 MG PO TABS: 50.0000 mg | ORAL_TABLET | Freq: Every day | ORAL | Status: DC

## 2013-08-23 MED ORDER — VALSARTAN 160 MG PO TABS: 160.0000 mg | ORAL_TABLET | Freq: Every day | ORAL | Status: DC

## 2013-08-23 MED ORDER — ATORVASTATIN CALCIUM 80 MG PO TABS: 80.0000 mg | ORAL_TABLET | Freq: Every day | ORAL | Status: DC

## 2013-08-23 MED ORDER — PRASUGREL HCL 10 MG PO TABS: 10.0000 mg | ORAL_TABLET | Freq: Every day | ORAL | Status: DC

## 2013-08-23 NOTE — Assessment & Plan Note (Signed)
Seems to be worse since retiring  May benefit from referral to behavioral health  Plan per primary  Continue MAO

## 2013-08-23 NOTE — Assessment & Plan Note (Signed)
Cholesterol is at goal.  Continue current dose of statin and diet Rx.  No myalgias or side effects.  F/U  LFT's in 6 months. No results found for this basename: [facility]  Labs with [facility]

## 2013-08-23 NOTE — Progress Notes (Signed)
Patient ID: [name], male   DOB: Apr 10, 1946, 67 y.o.   MRN: [id number] 67 yo previously seen by GD. Stent LAD in 2007. Quit smoking in 2000. Elevated lipids on statin with recent labs showing LDL 75. HTN well controlled recent increase in beta blocker. Had GI bleed post colonoscopy 2/12 with elevated enzymes. IP myovue normal with no ischemia. Changed to Effient because of chronic need for H2 blocker. Having excess sweating and dry mouth. No angina, dyspnea palpitations. Compliant with meds Reviewed labs from Dr Manus Gunning look good. Retiring from [employer] this month which is a stress relief  ROS: Denies fever, malais, weight loss, blurry vision, decreased visual acuity, cough, sputum, SOB, hemoptysis, pleuritic pain, palpitaitons, heartburn, abdominal pain, melena, lower extremity edema, claudication, or rash.  All other systems reviewed and negative  General: Affect appropriate Healthy:  appears stated age HEENT: normal Neck supple with no adenopathy JVP normal no bruits no thyromegaly Lungs clear with no wheezing and good diaphragmatic motion Heart:  S1/S2 no murmur, no rub, gallop or click PMI normal Abdomen: benighn, BS positve, no tenderness, no AAA no bruit.  No HSM or HJR Distal pulses intact with no bruits No edema Neuro non-focal Skin warm and dry No muscular weakness   Current Outpatient Prescriptions  Medication Sig Dispense Refill  . aspirin EC 81 MG tablet Take 81 mg by mouth daily.        Marland Kitchen atorvastatin (LIPITOR) 80 MG tablet Take 80 mg by mouth daily.      . budesonide-formoterol (SYMBICORT) 80-4.5 MCG/ACT inhaler Inhale 2 puffs into the lungs as needed.        Marland Kitchen buPROPion (WELLBUTRIN XL) 300 MG 24 hr tablet Take 300 mg by mouth daily.        Marland Kitchen imipramine (TOFRANIL) 50 MG tablet Take 50 mg by mouth at bedtime.        . metFORMIN (GLUCOPHAGE-XR) 500 MG 24 hr tablet Take 1 tablet by mouth Daily.      . metoprolol (LOPRESSOR) 50 MG tablet Take 1 tablet (50 mg total) by  mouth daily.  30 tablet  1  . Multiple Vitamin (MULTIVITAMIN) tablet Take 1 tablet by mouth daily.        . nitroGLYCERIN (NITROSTAT) 0.4 MG SL tablet Place 0.4 mg under the tongue every 5 (five) minutes as needed.        . pantoprazole (PROTONIX) 40 MG tablet Take 40 mg by mouth daily.        . prasugrel (EFFIENT) 10 MG TABS tablet Take 1 tablet (10 mg total) by mouth daily.  90 tablet  0  . valsartan (DIOVAN) 160 MG tablet Take 1 tablet (160 mg total) by mouth daily.  30 tablet  1   No current facility-administered medications for this visit.    Allergies  Codeine  Electrocardiogram:  SR rate 78 normal ECG   Assessment and Plan

## 2013-08-23 NOTE — Assessment & Plan Note (Signed)
More exertional dyspnea may be related to weight gain and being out of shape.  F/U ETT

## 2013-08-23 NOTE — Patient Instructions (Signed)
Your physician wants you to follow-up in:   6 MONTHS  WITH  DR NISHAN You will receive a reminder letter in the mail two months in advance. If you don't receive a letter, please call our office to schedule the follow-up appointment. Your physician recommends that you continue on your current medications as directed. Please refer to the Current Medication list given to you today.  Your physician has requested that you have an exercise tolerance test. For further information please visit [email or web address]. Please also follow instruction sheet, as given.   

## 2013-08-23 NOTE — Assessment & Plan Note (Signed)
Discussed low carb diet.  Target hemoglobin A1c is 6.5 or less.  Continue current medications. Discussed need to monitor BS at home better

## 2013-08-23 NOTE — Assessment & Plan Note (Signed)
Did not take ARB this am  Monitor at home Continue current meds Low sodium diet

## 2013-09-23 ENCOUNTER — Ambulatory Visit (INDEPENDENT_AMBULATORY_CARE_PROVIDER_SITE_OTHER): Payer: 59 | Admitting: Physician Assistant

## 2013-09-23 DIAGNOSIS — R0600 Dyspnea, unspecified: Secondary | ICD-10-CM

## 2013-09-23 DIAGNOSIS — R9439 Abnormal result of other cardiovascular function study: Secondary | ICD-10-CM

## 2013-09-23 DIAGNOSIS — I2510 Atherosclerotic heart disease of native coronary artery without angina pectoris: Secondary | ICD-10-CM

## 2013-09-23 NOTE — Progress Notes (Signed)
Exercise Treadmill Test  Pre-Exercise Testing Evaluation Rhythm: normal sinus  Rate: 82 bpm     Test  Exercise Tolerance Test Ordering MD: Charlton Haws, MD  Interpreting MD: Tereso Newcomer, PA-C  Unique Test No: 1  Treadmill:  1  Indication for ETT: exertional dyspnea  Contraindication to ETT: No   Stress Modality: exercise - treadmill  Cardiac Imaging Performed: non   Protocol: standard Bruce - maximal  Max BP:  160/61  Max MPHR (bpm):  155 85% MPR (bpm):  132  MPHR obtained (bpm):  125 % MPHR obtained:  80  Reached 85% MPHR (min:sec):  n/a Total Exercise Time (min-sec):  6:27  Workload in METS:  7.6 Borg Scale: 12  Reason ETT Terminated:  dyspnea    ST Segment Analysis At Rest: normal ST segments - no evidence of significant ST depression With Exercise: borderline ST changes  Other Information Arrhythmia:  No Angina during ETT:  absent (0) Quality of ETT:  non-diagnostic  ETT Interpretation:  borderline (indeterminate) with non-specific ST changes  Comments: Fair exercise capacity. No chest pain.  He had significant dyspnea.  O2 sats remained 99-93% on RA.  Patient took several minutes in recovery to return to baseline respirations.  Normal BP response to exercise. There was borderline ST depression at peak exercise. Submaximal exercise as target HR not achieved.   Recommendations: Schedule Lexiscan Myoview. Signed,  Tereso Newcomer, PA-C   09/23/2013 9:38 AM

## 2013-09-23 NOTE — Patient Instructions (Addendum)
Your physician has requested that you have a lexiscan myoview. DX: ABNORMAL STRESS TEST. For further information please visit [email or web address] Please follow instruction sheet, as given.  Your physician recommends that you schedule a follow-up appointment in: 2- 4 WEEKS AFTER STRESS TEST WITH DR. Eden Emms

## 2013-10-13 ENCOUNTER — Ambulatory Visit (HOSPITAL_COMMUNITY): Payer: 59 | Attending: Physician Assistant | Admitting: Radiology

## 2013-10-13 ENCOUNTER — Encounter: Payer: Self-pay | Admitting: Cardiovascular Disease

## 2013-10-13 VITALS — BP 137/87 | Ht 70.0 in | Wt 214.0 lb

## 2013-10-13 DIAGNOSIS — Z87891 Personal history of nicotine dependence: Secondary | ICD-10-CM | POA: Insufficient documentation

## 2013-10-13 DIAGNOSIS — R0600 Dyspnea, unspecified: Secondary | ICD-10-CM

## 2013-10-13 DIAGNOSIS — R9439 Abnormal result of other cardiovascular function study: Secondary | ICD-10-CM | POA: Insufficient documentation

## 2013-10-13 DIAGNOSIS — E119 Type 2 diabetes mellitus without complications: Secondary | ICD-10-CM | POA: Insufficient documentation

## 2013-10-13 DIAGNOSIS — E785 Hyperlipidemia, unspecified: Secondary | ICD-10-CM | POA: Insufficient documentation

## 2013-10-13 DIAGNOSIS — I252 Old myocardial infarction: Secondary | ICD-10-CM | POA: Insufficient documentation

## 2013-10-13 DIAGNOSIS — I10 Essential (primary) hypertension: Secondary | ICD-10-CM | POA: Insufficient documentation

## 2013-10-13 DIAGNOSIS — R0989 Other specified symptoms and signs involving the circulatory and respiratory systems: Secondary | ICD-10-CM | POA: Insufficient documentation

## 2013-10-13 DIAGNOSIS — Z794 Long term (current) use of insulin: Secondary | ICD-10-CM | POA: Insufficient documentation

## 2013-10-13 DIAGNOSIS — I2510 Atherosclerotic heart disease of native coronary artery without angina pectoris: Secondary | ICD-10-CM

## 2013-10-13 DIAGNOSIS — J449 Chronic obstructive pulmonary disease, unspecified: Secondary | ICD-10-CM | POA: Insufficient documentation

## 2013-10-13 DIAGNOSIS — Z8249 Family history of ischemic heart disease and other diseases of the circulatory system: Secondary | ICD-10-CM | POA: Insufficient documentation

## 2013-10-13 DIAGNOSIS — R0602 Shortness of breath: Secondary | ICD-10-CM | POA: Insufficient documentation

## 2013-10-13 DIAGNOSIS — R0609 Other forms of dyspnea: Secondary | ICD-10-CM | POA: Insufficient documentation

## 2013-10-13 DIAGNOSIS — J4489 Other specified chronic obstructive pulmonary disease: Secondary | ICD-10-CM | POA: Insufficient documentation

## 2013-10-13 MED ORDER — REGADENOSON 0.4 MG/5ML IV SOLN
0.4000 mg | Freq: Once | INTRAVENOUS | Status: AC
  Administered 2013-10-13: 0.4 mg via INTRAVENOUS

## 2013-10-13 MED ORDER — TECHNETIUM TC 99M SESTAMIBI GENERIC - CARDIOLITE
11.0000 | Freq: Once | INTRAVENOUS | Status: AC | PRN
  Administered 2013-10-13: 11 via INTRAVENOUS

## 2013-10-13 MED ORDER — TECHNETIUM TC 99M SESTAMIBI GENERIC - CARDIOLITE
33.0000 | Freq: Once | INTRAVENOUS | Status: AC | PRN
  Administered 2013-10-13: 33 via INTRAVENOUS

## 2013-10-13 NOTE — Progress Notes (Signed)
[facility] 3 NUCLEAR MED [address] [place], [place] [address] [phone]    Cardiology Nuclear Med Study  [name] is a 67 y.o. male     MRN : [id number]     DOB: 1946/07/06  Procedure Date: 10/13/2013  Nuclear Med Background Indication for Stress Test:  Evaluation for Ischemia, Stent Patency and Abnormal GXT History:  COPD and 2007: MI-Stent LAD  Abn GXT 10/14 Significant DOE Cardiac Risk Factors: Family History - CAD, History of Smoking, Hypertension, IDDM Type 2 and Lipids  Symptoms:  DOE and SOB   Nuclear Pre-Procedure Caffeine/Decaff Intake:  None NPO After: 8:00pm   Lungs:  clear O2 Sat: 96% on room air. IV 0.9% NS with Angio Cath:  22g  IV Site: R Hand  IV Started by:  Bonnita Levan, RN  Chest Size (in):  46 Cup Size: n/a  Height: 5\' 10"  (1.778 m)  Weight:  214 lb (97.07 kg)  BMI:  Body mass index is 30.71 kg/(m^2). Tech Comments:  N/A    Nuclear Med Study 1 or 2 day study: 1 day  Stress Test Type:  Treadmill/Lexiscan  Reading MD: [name], MD  Order Authorizing Provider:  Charlton Haws, MD  Resting Radionuclide: Technetium 87m Sestamibi  Resting Radionuclide Dose: 11.0 mCi   Stress Radionuclide:  Technetium 50m Sestamibi  Stress Radionuclide Dose: 32.8 mCi           Stress Protocol Rest HR: 81 Stress HR: 110  Rest BP: 137/87 Stress BP: 162/84  Exercise Time (min): n/a METS: n/a   Predicted Max HR: 155 bpm % Max HR: 70.97 bpm Rate Pressure Product: 21308   Dose of Adenosine (mg):  n/a Dose of Lexiscan: 0.4 mg  Dose of Atropine (mg): n/a Dose of Dobutamine: n/a mcg/kg/min (at max HR)  Stress Test Technologist: Milana Na, EMT-P  Nuclear Technologist:  Domenic Polite, CNMT     Rest Procedure:  Myocardial perfusion imaging was performed at rest 45 minutes following the intravenous administration of Technetium 68m Sestamibi. Rest ECG: NSR - Normal EKG  Stress Procedure:  The patient received IV Lexiscan 0.4 mg over  15-seconds.  Technetium 21m Sestamibi injected at 30-seconds.This patient had a headache with the Lexiscan injection.  Quantitative spect images were obtained after a 45 minute delay. Stress ECG: No significant change from baseline ECG  QPS Raw Data Images:  Normal; no motion artifact; normal heart/lung ratio. Stress Images:  Normal homogeneous uptake in all areas of the myocardium. Rest Images:  Normal homogeneous uptake in all areas of the myocardium. Subtraction (SDS):  No evidence of ischemia. Transient Ischemic Dilatation (Normal <1.22):  0.93 Lung/Heart Ratio (Normal <0.45):  0.28  Quantitative Gated Spect Images QGS EDV:  98 ml QGS ESV:  32 ml  Impression Exercise Capacity:  Lexiscan with no exercise. BP Response:  Normal blood pressure response. Clinical Symptoms:  No significant symptoms noted. ECG Impression:  No significant ST segment change suggestive of ischemia. Comparison with Prior Nuclear Study: No significant change from previous study 01/14/11  Overall Impression:  Normal stress nuclear study.  LV Ejection Fraction: 67%.  LV Wall Motion:  NL LV Function; NL Wall Motion.   [name], [name]., MD, [facility] 10/13/2013, 5:17 PM Office - [phone] Pager [phone]

## 2013-10-14 ENCOUNTER — Encounter: Payer: Self-pay | Admitting: Physician Assistant

## 2013-11-01 ENCOUNTER — Ambulatory Visit (INDEPENDENT_AMBULATORY_CARE_PROVIDER_SITE_OTHER): Payer: 59 | Admitting: Cardiovascular Disease

## 2013-11-01 ENCOUNTER — Encounter: Payer: Self-pay | Admitting: Cardiovascular Disease

## 2013-11-01 VITALS — BP 140/82 | HR 94 | Ht 70.0 in | Wt 223.0 lb

## 2013-11-01 DIAGNOSIS — E119 Type 2 diabetes mellitus without complications: Secondary | ICD-10-CM

## 2013-11-01 DIAGNOSIS — I2510 Atherosclerotic heart disease of native coronary artery without angina pectoris: Secondary | ICD-10-CM

## 2013-11-01 MED ORDER — NITROGLYCERIN 0.4 MG SL SUBL: 0.4000 mg | SUBLINGUAL_TABLET | SUBLINGUAL | Status: DC | PRN

## 2013-11-01 NOTE — Patient Instructions (Signed)
Your physician wants you to follow-up in:    6 MONTHS WITH DR Haywood Filler will receive a reminder letter in the mail two months in advance. If you don't receive a letter, please call our office to schedule the follow-up appointment. Your physician recommends that you continue on your current medications as directed. Please refer to the Current Medication list given to you today.  You have been referred to CARDIAC  REHAB

## 2013-11-01 NOTE — Progress Notes (Signed)
Patient ID: [name], male   DOB: [birth year], 67 y.o.   MRN: [id number] 67 yo previously seen by GD. Stent LAD in 2007. Quit smoking in 2000. Elevated lipids on statin with recent labs showing LDL 75. HTN well controlled recent increase in beta blocker. Had GI bleed post colonoscopy 2/12 with elevated enzymes. IP myovue normal with no ischemia. Changed to Effient because of chronic need for H2 blocker. Having excess sweating and dry mouth. No angina, dyspnea palpitations. Compliant with meds Reviewed labs from Dr Manus Gunning look good. Retiring from [employer] this month which is a stress relief   10/13/13 normal myovue EF 67%  Ordered by PA for dyspnea Has COPD ans is former smoker Had flu shot.  Is sedentary and out of shape Needs cardiac rehab referral  ROS: Denies fever, malais, weight loss, blurry vision, decreased visual acuity, cough, sputum, SOB, hemoptysis, pleuritic pain, palpitaitons, heartburn, abdominal pain, melena, lower extremity edema, claudication, or rash.  All other systems reviewed and negative  General: Affect appropriate Healthy:  appears stated age HEENT: normal Neck supple with no adenopathy JVP normal no bruits no thyromegaly Lungs clear with no wheezing and good diaphragmatic motion Heart:  S1/S2 no murmur, no rub, gallop or click PMI normal Abdomen: benighn, BS positve, no tenderness, no AAA no bruit.  No HSM or HJR Distal pulses intact with no bruits No edema Neuro non-focal Skin warm and dry No muscular weakness   Current Outpatient Prescriptions  Medication Sig Dispense Refill  . aspirin EC 81 MG tablet Take 81 mg by mouth daily.        Marland Kitchen atorvastatin (LIPITOR) 80 MG tablet Take 1 tablet (80 mg total) by mouth daily.  90 tablet  3  . budesonide-formoterol (SYMBICORT) 80-4.5 MCG/ACT inhaler Inhale 2 puffs into the lungs as needed.        Marland Kitchen buPROPion (WELLBUTRIN XL) 300 MG 24 hr tablet Take 300 mg by mouth daily.        Marland Kitchen imipramine (TOFRANIL) 50 MG tablet Take  50 mg by mouth at bedtime.        . metFORMIN (GLUCOPHAGE-XR) 500 MG 24 hr tablet Take 1 tablet by mouth Daily.      . metoprolol (LOPRESSOR) 50 MG tablet Take 1 tablet (50 mg total) by mouth daily.  90 tablet  3  . Multiple Vitamin (MULTIVITAMIN) tablet Take 1 tablet by mouth daily.        . nitroGLYCERIN (NITROSTAT) 0.4 MG SL tablet Place 0.4 mg under the tongue every 5 (five) minutes as needed.        . pantoprazole (PROTONIX) 40 MG tablet Take 40 mg by mouth daily.        . prasugrel (EFFIENT) 10 MG TABS tablet Take 1 tablet (10 mg total) by mouth daily.  90 tablet  3  . valsartan (DIOVAN) 160 MG tablet Take 1 tablet (160 mg total) by mouth daily.  90 tablet  3   No current facility-administered medications for this visit.    Allergies  Codeine  Electrocardiogram:  09/23/13 SR rate 74 normal   Assessment and Plan

## 2013-11-01 NOTE — Assessment & Plan Note (Signed)
Stable with no angina and good activity level.  Continue medical Rx Needs referral to cardiac rehab.  New nitro called in

## 2013-11-01 NOTE — Assessment & Plan Note (Signed)
Well controlled.  Continue current medications and low sodium Dash type diet.    

## 2013-11-01 NOTE — Assessment & Plan Note (Signed)
Discussed low carb diet.  Target hemoglobin A1c is 6.5 or less.  Continue current medications.  

## 2013-11-01 NOTE — Assessment & Plan Note (Signed)
Cholesterol is at goal.  Continue current dose of statin and diet Rx.  No myalgias or side effects.  F/U  LFT's in 6 months. No results found for this basename: LDLCALC             

## 2013-12-26 DIAGNOSIS — K2270 Barrett's esophagus without dysplasia: Secondary | ICD-10-CM

## 2013-12-26 HISTORY — DX: Barrett's esophagus without dysplasia: K22.70

## 2014-01-10 ENCOUNTER — Other Ambulatory Visit: Payer: Self-pay | Admitting: Family Medicine

## 2014-01-10 ENCOUNTER — Encounter: Payer: Self-pay | Admitting: Cardiovascular Disease

## 2014-01-10 DIAGNOSIS — K219 Gastro-esophageal reflux disease without esophagitis: Secondary | ICD-10-CM | POA: Diagnosis not present

## 2014-01-10 DIAGNOSIS — J449 Chronic obstructive pulmonary disease, unspecified: Secondary | ICD-10-CM | POA: Diagnosis not present

## 2014-01-10 DIAGNOSIS — I2510 Atherosclerotic heart disease of native coronary artery without angina pectoris: Secondary | ICD-10-CM | POA: Diagnosis not present

## 2014-01-10 DIAGNOSIS — N183 Chronic kidney disease, stage 3 unspecified: Secondary | ICD-10-CM | POA: Diagnosis not present

## 2014-01-10 DIAGNOSIS — Z79899 Other long term (current) drug therapy: Secondary | ICD-10-CM | POA: Diagnosis not present

## 2014-01-10 DIAGNOSIS — E7800 Pure hypercholesterolemia, unspecified: Secondary | ICD-10-CM | POA: Diagnosis not present

## 2014-01-10 DIAGNOSIS — I129 Hypertensive chronic kidney disease with stage 1 through stage 4 chronic kidney disease, or unspecified chronic kidney disease: Secondary | ICD-10-CM | POA: Diagnosis not present

## 2014-01-10 DIAGNOSIS — E1129 Type 2 diabetes mellitus with other diabetic kidney complication: Secondary | ICD-10-CM | POA: Diagnosis not present

## 2014-01-10 DIAGNOSIS — F329 Major depressive disorder, single episode, unspecified: Secondary | ICD-10-CM | POA: Diagnosis not present

## 2014-01-13 ENCOUNTER — Ambulatory Visit
Admission: RE | Admit: 2014-01-13 | Discharge: 2014-01-13 | Disposition: A | Discharge status: Home / Self Care | Payer: No Typology Code available for payment source | Source: Ambulatory Visit | Attending: Family Medicine | Admitting: Family Medicine

## 2014-01-13 DIAGNOSIS — K449 Diaphragmatic hernia without obstruction or gangrene: Secondary | ICD-10-CM | POA: Diagnosis not present

## 2014-01-13 DIAGNOSIS — K219 Gastro-esophageal reflux disease without esophagitis: Secondary | ICD-10-CM

## 2014-01-31 DIAGNOSIS — Z86010 Personal history of colonic polyps: Secondary | ICD-10-CM | POA: Diagnosis not present

## 2014-01-31 DIAGNOSIS — R1310 Dysphagia, unspecified: Secondary | ICD-10-CM | POA: Diagnosis not present

## 2014-01-31 DIAGNOSIS — K219 Gastro-esophageal reflux disease without esophagitis: Secondary | ICD-10-CM | POA: Diagnosis not present

## 2014-02-07 ENCOUNTER — Telehealth: Payer: Self-pay

## 2014-02-07 ENCOUNTER — Other Ambulatory Visit: Payer: Self-pay

## 2014-02-07 MED ORDER — PRASUGREL HCL 10 MG PO TABS: 10.0000 mg | ORAL_TABLET | Freq: Every day | ORAL | Status: DC

## 2014-02-07 MED ORDER — ATORVASTATIN CALCIUM 80 MG PO TABS: 80.0000 mg | ORAL_TABLET | Freq: Every day | ORAL | Status: DC

## 2014-02-07 MED ORDER — METOPROLOL TARTRATE 50 MG PO TABS: 50.0000 mg | ORAL_TABLET | Freq: Every day | ORAL | Status: DC

## 2014-02-07 MED ORDER — VALSARTAN 160 MG PO TABS: 160.0000 mg | ORAL_TABLET | Freq: Every day | ORAL | Status: DC

## 2014-02-07 NOTE — Telephone Encounter (Signed)
Patient called let his meds run out send him a 90 days with 1 refill to his mail order and 14 day supply to a local pharmacy. Also gave him a sample of effient

## 2014-03-07 ENCOUNTER — Telehealth: Payer: Self-pay

## 2014-03-07 NOTE — Telephone Encounter (Signed)
Placed effient samples back in closet

## 2014-03-16 ENCOUNTER — Other Ambulatory Visit: Payer: Self-pay | Admitting: Gastroenterology

## 2014-03-16 DIAGNOSIS — R1310 Dysphagia, unspecified: Secondary | ICD-10-CM | POA: Diagnosis not present

## 2014-03-16 DIAGNOSIS — Z09 Encounter for follow-up examination after completed treatment for conditions other than malignant neoplasm: Secondary | ICD-10-CM | POA: Diagnosis not present

## 2014-03-16 DIAGNOSIS — D126 Benign neoplasm of colon, unspecified: Secondary | ICD-10-CM | POA: Diagnosis not present

## 2014-03-16 DIAGNOSIS — K449 Diaphragmatic hernia without obstruction or gangrene: Secondary | ICD-10-CM | POA: Diagnosis not present

## 2014-03-16 DIAGNOSIS — Z86010 Personal history of colonic polyps: Secondary | ICD-10-CM | POA: Diagnosis not present

## 2014-03-16 DIAGNOSIS — K219 Gastro-esophageal reflux disease without esophagitis: Secondary | ICD-10-CM | POA: Diagnosis not present

## 2014-03-16 DIAGNOSIS — K648 Other hemorrhoids: Secondary | ICD-10-CM | POA: Diagnosis not present

## 2014-03-16 DIAGNOSIS — K2270 Barrett's esophagus without dysplasia: Secondary | ICD-10-CM | POA: Diagnosis not present

## 2014-03-23 ENCOUNTER — Telehealth: Payer: Self-pay | Admitting: *Deleted

## 2014-03-23 NOTE — Telephone Encounter (Signed)
Patient requests effient samples. I will place them at the front desk.

## 2014-04-11 DIAGNOSIS — E1129 Type 2 diabetes mellitus with other diabetic kidney complication: Secondary | ICD-10-CM | POA: Diagnosis not present

## 2014-04-22 ENCOUNTER — Other Ambulatory Visit: Payer: Self-pay | Admitting: *Deleted

## 2014-04-22 MED ORDER — METOPROLOL TARTRATE 50 MG PO TABS: 50.0000 mg | ORAL_TABLET | Freq: Every day | ORAL | Status: DC

## 2014-04-22 MED ORDER — PRASUGREL HCL 10 MG PO TABS: 10.0000 mg | ORAL_TABLET | Freq: Every day | ORAL | Status: DC

## 2014-04-22 MED ORDER — ATORVASTATIN CALCIUM 80 MG PO TABS: 80.0000 mg | ORAL_TABLET | Freq: Every day | ORAL | Status: DC

## 2014-04-22 MED ORDER — VALSARTAN 160 MG PO TABS: 160.0000 mg | ORAL_TABLET | Freq: Every day | ORAL | Status: DC

## 2014-06-02 ENCOUNTER — Other Ambulatory Visit: Payer: Self-pay

## 2014-06-02 MED ORDER — PRASUGREL HCL 10 MG PO TABS: 10.0000 mg | ORAL_TABLET | Freq: Every day | ORAL | Status: DC

## 2014-07-25 DIAGNOSIS — K219 Gastro-esophageal reflux disease without esophagitis: Secondary | ICD-10-CM | POA: Diagnosis not present

## 2014-07-25 DIAGNOSIS — E7800 Pure hypercholesterolemia, unspecified: Secondary | ICD-10-CM | POA: Diagnosis not present

## 2014-07-25 DIAGNOSIS — E1129 Type 2 diabetes mellitus with other diabetic kidney complication: Secondary | ICD-10-CM | POA: Diagnosis not present

## 2014-07-25 DIAGNOSIS — F329 Major depressive disorder, single episode, unspecified: Secondary | ICD-10-CM | POA: Diagnosis not present

## 2014-07-25 DIAGNOSIS — I129 Hypertensive chronic kidney disease with stage 1 through stage 4 chronic kidney disease, or unspecified chronic kidney disease: Secondary | ICD-10-CM | POA: Diagnosis not present

## 2014-07-25 DIAGNOSIS — I2510 Atherosclerotic heart disease of native coronary artery without angina pectoris: Secondary | ICD-10-CM | POA: Diagnosis not present

## 2014-07-25 DIAGNOSIS — J449 Chronic obstructive pulmonary disease, unspecified: Secondary | ICD-10-CM | POA: Diagnosis not present

## 2014-07-25 DIAGNOSIS — N183 Chronic kidney disease, stage 3 unspecified: Secondary | ICD-10-CM | POA: Diagnosis not present

## 2014-08-04 ENCOUNTER — Other Ambulatory Visit: Payer: Self-pay | Admitting: *Deleted

## 2014-08-04 ENCOUNTER — Telehealth: Payer: Self-pay | Admitting: *Deleted

## 2014-08-04 MED ORDER — VALSARTAN 160 MG PO TABS: 160.0000 mg | ORAL_TABLET | Freq: Every day | ORAL | Status: DC

## 2014-08-04 NOTE — Telephone Encounter (Signed)
Patient called and he stated that he has not taken the effient for the past 2 to 3 weeks due to cost. I advised him that we do have samples of that medication but that I would check with the nurse to see if it would be ok for him to just re-start it or if there were other protocols that need to be followed. He also wanted to know if there were cheaper comparable medications. Please advise. Thanks, MI

## 2014-08-04 NOTE — Telephone Encounter (Signed)
Left pt a message to call back. 

## 2014-08-05 NOTE — Telephone Encounter (Signed)
Pt is aware to come to the office to peak up 35 sample pills of Effient 10 mg, at the front desk, and  that he can get samples of this medication if available when needed. Pt would like to know if there is a cheaper  Comparable medication that he can afford.

## 2014-08-12 NOTE — Telephone Encounter (Signed)
Can stop effient and change to generic plavix 75 mg daily

## 2014-08-12 NOTE — Telephone Encounter (Signed)
lmtcb

## 2014-08-15 MED ORDER — CLOPIDOGREL BISULFATE 75 MG PO TABS: 75.0000 mg | ORAL_TABLET | Freq: Every day | ORAL | Status: DC

## 2014-08-15 NOTE — Telephone Encounter (Signed)
PT  AWARE OF  CHANGE./CY

## 2014-09-02 DIAGNOSIS — N4889 Other specified disorders of penis: Secondary | ICD-10-CM | POA: Diagnosis not present

## 2014-09-14 ENCOUNTER — Encounter: Payer: Self-pay | Admitting: Cardiovascular Disease

## 2014-09-14 ENCOUNTER — Ambulatory Visit (INDEPENDENT_AMBULATORY_CARE_PROVIDER_SITE_OTHER): Payer: Medicare Other | Admitting: Cardiovascular Disease

## 2014-09-14 VITALS — BP 116/70 | HR 77 | Ht 70.0 in | Wt 219.0 lb

## 2014-09-14 DIAGNOSIS — E119 Type 2 diabetes mellitus without complications: Secondary | ICD-10-CM

## 2014-09-14 DIAGNOSIS — E7800 Pure hypercholesterolemia, unspecified: Secondary | ICD-10-CM

## 2014-09-14 DIAGNOSIS — I2510 Atherosclerotic heart disease of native coronary artery without angina pectoris: Secondary | ICD-10-CM

## 2014-09-14 DIAGNOSIS — I10 Essential (primary) hypertension: Secondary | ICD-10-CM | POA: Diagnosis not present

## 2014-09-14 NOTE — Patient Instructions (Signed)
Your physician wants you to follow-up in: YEAR WITH DR NISHAN  You will receive a reminder letter in the mail two months in advance. If you don't receive a letter, please call our office to schedule the follow-up appointment.  Your physician recommends that you continue on your current medications as directed. Please refer to the Current Medication list given to you today. 

## 2014-09-14 NOTE — Progress Notes (Signed)
Patient ID: SAEL FURCHES, male   DOB: [birth year], 68 y.o.   MRN: [id number] 68 yo previously seen by GD. Stent LAD in 2007. Quit smoking in 2000. Elevated lipids on statin with recent labs showing LDL 75. HTN well controlled recent increase in beta blocker. Had GI bleed post colonoscopy 2/12 with elevated enzymes. IP myovue normal with no ischemia. Changed to Effient because of chronic need for H2 blocker. Having excess sweating and dry mouth. No angina, dyspnea palpitations. Compliant with meds Reviewed labs from Dr Marisue Humble look good. Retiring from [employer] this month which is a stress relief  10/13/13 normal myovue EF 67% Ordered by PA for dyspnea Has COPD ans is former smoker Had flu shot. Is sedentary and out of shape  Has lost some weight and trying to watch portions and carbs  Asked him to discuss with Dr Ihor Gully about changing from lipitor to crestor if price not prohibitive to decrease side effect risks on such a high dose of lipitor       ROS: Denies fever, malais, weight loss, blurry vision, decreased visual acuity, cough, sputum, SOB, hemoptysis, pleuritic pain, palpitaitons, heartburn, abdominal pain, melena, lower extremity edema, claudication, or rash.  All other systems reviewed and negative  General: Affect appropriate Healthy:  appears stated age 47: normal Neck supple with no adenopathy JVP normal no bruits no thyromegaly Lungs clear with no wheezing and good diaphragmatic motion Heart:  S1/S2 no murmur, no rub, gallop or click PMI normal Abdomen: benighn, BS positve, no tenderness, no AAA no bruit.  No HSM or HJR Distal pulses intact with no bruits No edema Neuro non-focal Skin warm and dry No muscular weakness   Current Outpatient Prescriptions  Medication Sig Dispense Refill  . aspirin EC 81 MG tablet Take 81 mg by mouth daily.       Marland Kitchen atorvastatin (LIPITOR) 80 MG tablet Take 1 tablet (80 mg total) by mouth daily.  90 tablet  0  . budesonide-formoterol  (SYMBICORT) 80-4.5 MCG/ACT inhaler Inhale 2 puffs into the lungs.       Marland Kitchen buPROPion (WELLBUTRIN XL) 300 MG 24 hr tablet Take 300 mg by mouth daily.       . clopidogrel (PLAVIX) 75 MG tablet Take 1 tablet (75 mg total) by mouth daily.  90 tablet  3  . imipramine (TOFRANIL) 50 MG tablet Take 50 mg by mouth at bedtime.        . metFORMIN (GLUCOPHAGE-XR) 500 MG 24 hr tablet Take 1 tablet three time a day      . metoprolol (LOPRESSOR) 50 MG tablet Take 1 tablet (50 mg total) by mouth daily.  90 tablet  0  . Multiple Vitamin (MULTIVITAMIN) tablet Take 1 tablet by mouth daily.        . nitroGLYCERIN (NITROSTAT) 0.4 MG SL tablet Place 1 tablet (0.4 mg total) under the tongue every 5 (five) minutes as needed.  25 tablet  4  . pantoprazole (PROTONIX) 40 MG tablet Take 40 mg by mouth daily.        . valsartan (DIOVAN) 160 MG tablet Take 1 tablet (160 mg total) by mouth daily.  90 tablet  0   No current facility-administered medications for this visit.    Allergies  Codeine  Electrocardiogram:  SR rate 77 Q in lead 3 otherwise normal  Assessment and Plan

## 2014-09-14 NOTE — Assessment & Plan Note (Signed)
Discussed low carb diet.  Target hemoglobin A1c is 6.5 or less.  Continue current medications.  

## 2014-09-14 NOTE — Assessment & Plan Note (Signed)
Stable with no angina and good activity level.  Continue medical Rx Has nitro that he carries with him

## 2014-09-14 NOTE — Assessment & Plan Note (Signed)
Well controlled.  Continue current medications and low sodium Dash type diet.    

## 2014-09-14 NOTE — Assessment & Plan Note (Signed)
Cholesterol is at goal.  Continue current dose of statin and diet Rx.  No myalgias or side effects.  F/U  LFT's in 6 months. No results found for this basename: LDLCALC  LDL under 80 but requires 80 mg lipitor consider change to crestor to decrease risk of side effects

## 2014-10-31 ENCOUNTER — Other Ambulatory Visit: Payer: Self-pay | Admitting: Cardiovascular Disease

## 2014-11-05 DIAGNOSIS — Z23 Encounter for immunization: Secondary | ICD-10-CM | POA: Diagnosis not present

## 2014-11-13 ENCOUNTER — Other Ambulatory Visit: Payer: Self-pay | Admitting: Cardiovascular Disease

## 2014-12-01 ENCOUNTER — Other Ambulatory Visit: Payer: Self-pay | Admitting: *Deleted

## 2014-12-01 MED ORDER — VALSARTAN 160 MG PO TABS: ORAL_TABLET | ORAL | Status: DC

## 2014-12-01 MED ORDER — CLOPIDOGREL BISULFATE 75 MG PO TABS: 75.0000 mg | ORAL_TABLET | Freq: Every day | ORAL | Status: DC

## 2015-02-10 DIAGNOSIS — I2510 Atherosclerotic heart disease of native coronary artery without angina pectoris: Secondary | ICD-10-CM | POA: Diagnosis not present

## 2015-02-10 DIAGNOSIS — K219 Gastro-esophageal reflux disease without esophagitis: Secondary | ICD-10-CM | POA: Diagnosis not present

## 2015-02-10 DIAGNOSIS — E1129 Type 2 diabetes mellitus with other diabetic kidney complication: Secondary | ICD-10-CM | POA: Diagnosis not present

## 2015-02-10 DIAGNOSIS — N183 Chronic kidney disease, stage 3 (moderate): Secondary | ICD-10-CM | POA: Diagnosis not present

## 2015-02-10 DIAGNOSIS — E780 Pure hypercholesterolemia: Secondary | ICD-10-CM | POA: Diagnosis not present

## 2015-02-10 DIAGNOSIS — I129 Hypertensive chronic kidney disease with stage 1 through stage 4 chronic kidney disease, or unspecified chronic kidney disease: Secondary | ICD-10-CM | POA: Diagnosis not present

## 2015-02-10 DIAGNOSIS — K2270 Barrett's esophagus without dysplasia: Secondary | ICD-10-CM | POA: Diagnosis not present

## 2015-02-10 DIAGNOSIS — J449 Chronic obstructive pulmonary disease, unspecified: Secondary | ICD-10-CM | POA: Diagnosis not present

## 2015-02-10 DIAGNOSIS — F322 Major depressive disorder, single episode, severe without psychotic features: Secondary | ICD-10-CM | POA: Diagnosis not present

## 2015-02-13 ENCOUNTER — Encounter: Payer: Self-pay | Admitting: Cardiovascular Disease

## 2015-07-03 ENCOUNTER — Telehealth: Payer: Self-pay | Admitting: Cardiovascular Disease

## 2015-07-03 NOTE — Telephone Encounter (Signed)
New message      1. What dental office are you calling from?  Dr Jolayne Panther  What is your office phone and fax number? Fax [phone]    Office [phone] What type of procedure is the patient having performed? 13 extractions 2. What date is procedure scheduled? 07-04-15  3. What is your question (ex. Antibiotics prior to procedure, holding medication-we need to know how long dentist wants pt to hold med)?  Hold aspirin and plavix ------procedure is tomorrow

## 2015-07-03 NOTE — Telephone Encounter (Signed)
Will forward to Dr. Johnsie Cancel for review and advisement on holding Plavix and ASA.

## 2015-07-04 NOTE — Telephone Encounter (Signed)
Follow up    Procedure had to be cancelled for today since they did not received clearance Please call to let them know when clearance is done so they can reschedule pt

## 2015-07-04 NOTE — Telephone Encounter (Signed)
Sandy at Dr Jolayne Panther "dentist" called again for pt's surgical clearance. Pt needs  to hold Plavix and aspirin prior 13 dental extractions. Lovey Newcomer  is aware that Dr. Johnsie Cancel is on vacation this week. Md will be back next week. Lovey Newcomer would like to have clearance faxed to [phone].

## 2015-07-10 NOTE — Telephone Encounter (Signed)
FAXED THIS NOTE  TO DR ODEH'S OFFICE    AWARE PT  SHOULD HOLD PLAVIX  5 DAYS  PRIOR   TO PROCEDURE   AND  RESUME  AS SOON AS  DR Jolayne Panther APPROVES./CY

## 2015-07-10 NOTE — Telephone Encounter (Signed)
Ok to stop plavix and have dental extractions

## 2015-08-15 DIAGNOSIS — J449 Chronic obstructive pulmonary disease, unspecified: Secondary | ICD-10-CM | POA: Diagnosis not present

## 2015-08-15 DIAGNOSIS — F322 Major depressive disorder, single episode, severe without psychotic features: Secondary | ICD-10-CM | POA: Diagnosis not present

## 2015-08-15 DIAGNOSIS — K219 Gastro-esophageal reflux disease without esophagitis: Secondary | ICD-10-CM | POA: Diagnosis not present

## 2015-08-15 DIAGNOSIS — E1129 Type 2 diabetes mellitus with other diabetic kidney complication: Secondary | ICD-10-CM | POA: Diagnosis not present

## 2015-08-15 DIAGNOSIS — I2510 Atherosclerotic heart disease of native coronary artery without angina pectoris: Secondary | ICD-10-CM | POA: Diagnosis not present

## 2015-08-15 DIAGNOSIS — I129 Hypertensive chronic kidney disease with stage 1 through stage 4 chronic kidney disease, or unspecified chronic kidney disease: Secondary | ICD-10-CM | POA: Diagnosis not present

## 2015-08-15 DIAGNOSIS — E780 Pure hypercholesterolemia: Secondary | ICD-10-CM | POA: Diagnosis not present

## 2015-08-15 DIAGNOSIS — N183 Chronic kidney disease, stage 3 (moderate): Secondary | ICD-10-CM | POA: Diagnosis not present

## 2015-08-23 DIAGNOSIS — E119 Type 2 diabetes mellitus without complications: Secondary | ICD-10-CM | POA: Diagnosis not present

## 2015-08-23 DIAGNOSIS — H2513 Age-related nuclear cataract, bilateral: Secondary | ICD-10-CM | POA: Diagnosis not present

## 2015-09-12 ENCOUNTER — Other Ambulatory Visit: Payer: Self-pay | Admitting: Cardiovascular Disease

## 2015-09-12 DIAGNOSIS — R112 Nausea with vomiting, unspecified: Secondary | ICD-10-CM | POA: Diagnosis not present

## 2015-09-12 DIAGNOSIS — R42 Dizziness and giddiness: Secondary | ICD-10-CM | POA: Diagnosis not present

## 2015-11-13 ENCOUNTER — Encounter: Payer: Self-pay | Admitting: Cardiovascular Disease

## 2015-11-23 NOTE — Progress Notes (Signed)
Patient ID: [name], male   DOB: 14-Apr-1946, 69 y.o.   MRN: [id number]   69 y.o.  previously seen by GD. Stent LAD in 2007. Quit smoking in 2000. Elevated lipids on statin with recent labs showing LDL 75. HTN well controlled recent increase in beta blocker. Had GI bleed post colonoscopy 2/12 with elevated enzymes. IP myovue normal with no ischemia. Changed to Effient because of chronic need for H2 blocker. Having excess sweating and dry mouth. No angina, dyspnea palpitations. Compliant with meds Reviewed labs from Dr Marisue Humble look good. Retiring from [employer] this month which is a stress relief   10/13/13 normal myovue EF 67% Ordered by PA for dyspnea Has COPD ans is former smoker Had flu shot. Is sedentary and out of shape  Has lost some weight and trying to watch portions and carbs  Asked him to discuss with Dr Ihor Gully about changing from lipitor to crestor if price not prohibitive to decrease side effect risks on such a high dose of lipitor    ROS: Denies fever, malais, weight loss, blurry vision, decreased visual acuity, cough, sputum, SOB, hemoptysis, pleuritic pain, palpitaitons, heartburn, abdominal pain, melena, lower extremity edema, claudication, or rash.  All other systems reviewed and negative  General: Affect appropriate Healthy:  appears stated age 29: normal Neck supple with no adenopathy JVP normal no bruits no thyromegaly Lungs clear with no wheezing and good diaphragmatic motion Heart:  S1/S2 no murmur, no rub, gallop or click PMI normal Abdomen: benighn, BS positve, no tenderness, no AAA no bruit.  No HSM or HJR Distal pulses intact with no bruits No edema Neuro non-focal Skin warm and dry No muscular weakness   Current Outpatient Prescriptions  Medication Sig Dispense Refill  . aspirin EC 81 MG tablet Take 81 mg by mouth daily.     Marland Kitchen atorvastatin (LIPITOR) 80 MG tablet Take 1 tablet (80 mg total) by mouth daily. 90 tablet 0  . budesonide-formoterol  (SYMBICORT) 80-4.5 MCG/ACT inhaler Inhale 2 puffs into the lungs.     Marland Kitchen buPROPion (WELLBUTRIN XL) 300 MG 24 hr tablet Take 300 mg by mouth daily.     . clopidogrel (PLAVIX) 75 MG tablet TAKE 1 TABLET  DAILY. 90 tablet 3  . imipramine (TOFRANIL) 50 MG tablet Take 50 mg by mouth at bedtime.      . metFORMIN (GLUCOPHAGE-XR) 500 MG 24 hr tablet Take 1 tablet three time a day    . metoprolol (LOPRESSOR) 50 MG tablet Take 1 tablet (50 mg total) by mouth daily. 90 tablet 0  . Multiple Vitamin (MULTIVITAMIN) tablet Take 1 tablet by mouth daily.      . nitroGLYCERIN (NITROSTAT) 0.4 MG SL tablet Place 1 tablet (0.4 mg total) under the tongue every 5 (five) minutes as needed. 25 tablet 4  . pantoprazole (PROTONIX) 40 MG tablet Take 40 mg by mouth daily.      . valsartan (DIOVAN) 160 MG tablet TAKE 1 TABLET (160 MG TOTAL) BY MOUTH DAILY. 90 tablet 3   No current facility-administered medications for this visit.    Allergies  Codeine  Electrocardiogram:  09/14/14 SR rate 77 Q in lead 3 otherwise normal  Assessment and Plan CAD:  STent to LAD 91478 normal myovue 2014  Stable no angina continue ASA/Plavix and beta blocker HTN:  Well controlled.  Continue current medications and low sodium Dash type diet.   DM:  Discussed low carb diet.  Target hemoglobin A1c is 6.5 or less.  Continue current  medications. Chol:  Cholesterol is at goal.  Continue current dose of statin and diet Rx.  No myalgias or side effects.  F/U  LFT's in 6 months. No results found for: [facility]          Jenkins Rouge

## 2015-11-24 ENCOUNTER — Encounter: Payer: PRIVATE HEALTH INSURANCE | Admitting: Cardiovascular Disease

## 2015-11-24 ENCOUNTER — Other Ambulatory Visit: Payer: Self-pay | Admitting: *Deleted

## 2015-12-13 ENCOUNTER — Encounter: Payer: Self-pay | Admitting: *Deleted

## 2015-12-14 NOTE — Progress Notes (Signed)
Patient ID: [name], male   DOB: 1946/05/25, 70 y.o.   MRN: [id number]   70 y.o.  previously seen by GD. Stent LAD in 2007. Quit smoking in 2000. Elevated lipids on statin with recent labs showing LDL 75. HTN well controlled recent increase in beta blocker. Had GI bleed post colonoscopy 2/12 with elevated enzymes. IP myovue normal with no ischemia. Changed to Effient because of chronic need for H2 blocker. Having excess sweating and dry mouth. No angina, dyspnea palpitations. Compliant with meds Reviewed labs from Dr [name] look good. Retiring from [employer] this month which is a stress relief  10/13/13 normal myovue EF 67% Ordered by PA for dyspnea Has COPD ans is former smoker Had flu shot. Is sedentary and out of shape  Has lost some weight and trying to watch portions and carbs  Asked him to discuss with Dr [name] about changing from lipitor to crestor if price not prohibitive to decrease side effect risks on such a high dose of lipitor    ROS: Denies fever, malais, weight loss, blurry vision, decreased visual acuity, cough, sputum, SOB, hemoptysis, pleuritic pain, palpitaitons, heartburn, abdominal pain, melena, lower extremity edema, claudication, or rash.  All other systems reviewed and negative  General: Affect appropriate Healthy:  appears stated age 70: normal Neck supple with no adenopathy JVP normal no bruits no thyromegaly Lungs clear with no wheezing and good diaphragmatic motion Heart:  S1/S2 no murmur, no rub, gallop or click PMI normal Abdomen: benighn, BS positve, no tenderness, no AAA no bruit.  No HSM or HJR Distal pulses intact with no bruits No edema Neuro non-focal Skin warm and dry No muscular weakness   Current Outpatient Prescriptions  Medication Sig Dispense Refill  . aspirin EC 81 MG tablet Take 81 mg by mouth daily.     Marland Kitchen atorvastatin (LIPITOR) 80 MG tablet Take 1 tablet (80 mg total) by mouth daily. 90 tablet 0  . buPROPion (WELLBUTRIN XL) 300 MG  24 hr tablet Take 300 mg by mouth daily.     . clopidogrel (PLAVIX) 75 MG tablet Take 75 mg by mouth daily.    Marland Kitchen imipramine (TOFRANIL) 50 MG tablet Take 50 mg by mouth at bedtime.      . metFORMIN (GLUCOPHAGE-XR) 500 MG 24 hr tablet Take 500 mg by mouth 3 (three) times daily.     . metoprolol (LOPRESSOR) 50 MG tablet Take 1 tablet (50 mg total) by mouth daily. 90 tablet 0  . Multiple Vitamin (MULTIVITAMIN) tablet Take 1 tablet by mouth daily.      . nitroGLYCERIN (NITROSTAT) 0.4 MG SL tablet Place 0.4 mg under the tongue every 5 (five) minutes as needed for chest pain (3 doses max).    . pantoprazole (PROTONIX) 40 MG tablet Take 40 mg by mouth daily.      . SYMBICORT 160-4.5 MCG/ACT inhaler Inhale 2 puffs into the lungs daily as needed (for wheezing & shortness of breath).     . valsartan (DIOVAN) 160 MG tablet TAKE 1 TABLET (160 MG TOTAL) BY MOUTH DAILY. 90 tablet 3   No current facility-administered medications for this visit.    Allergies  Codeine  Electrocardiogram:   09/14/14  SR rate 77 Q in lead 3 otherwise normal 12/15/15  SR rate 68  Voltage LVH Q lead 3 no changes   Assessment and Plan CAD:  Distant stent to LAD 2007 no angina normal myovue 2014 consider one next year  Chol: prefer to change him to  lower dose crestor as increase risk side effects with 80 mg lipitor will try to get LFTls from primary TC137 HDL 41 LDL 52 las labs reviewed from March 2016  [id number] controlled.  Continue current medications and low sodium Dash type diet.    DM: Discussed low carb diet.  Target hemoglobin A1c is 6.5 or less.  Continue current medications.  COPD: no active wheezing f/u primary yearly CXR    [employer]

## 2015-12-15 ENCOUNTER — Encounter: Payer: Self-pay | Admitting: Cardiovascular Disease

## 2015-12-15 ENCOUNTER — Ambulatory Visit (INDEPENDENT_AMBULATORY_CARE_PROVIDER_SITE_OTHER): Payer: Commercial Managed Care - HMO | Admitting: Cardiovascular Disease

## 2015-12-15 VITALS — BP 102/70 | HR 68 | Ht 70.0 in | Wt 204.4 lb

## 2015-12-15 DIAGNOSIS — I10 Essential (primary) hypertension: Secondary | ICD-10-CM

## 2015-12-15 NOTE — Patient Instructions (Addendum)

## 2015-12-20 ENCOUNTER — Encounter: Payer: Self-pay | Admitting: Cardiovascular Disease

## 2015-12-21 ENCOUNTER — Other Ambulatory Visit: Payer: Self-pay | Admitting: Cardiovascular Disease

## 2015-12-21 NOTE — Telephone Encounter (Signed)
Josue Hector, MD at 12/14/2015 11:36 AM  valsartan (DIOVAN) 160 MG tabletTAKE 1 TABLET (160 MG TOTAL) BY MOUTH DAILY Patient Instructions     Medication Instructions:  Your physician recommends that you continue on your current medications as directed. Please refer to the Current Medication list given to you today

## 2015-12-25 ENCOUNTER — Ambulatory Visit (INDEPENDENT_AMBULATORY_CARE_PROVIDER_SITE_OTHER): Payer: Commercial Managed Care - HMO | Admitting: Family Medicine

## 2015-12-25 ENCOUNTER — Encounter: Payer: Self-pay | Admitting: Family Medicine

## 2015-12-25 VITALS — BP 129/85 | HR 68 | Temp 98.1°F | Resp 20 | Wt 203.8 lb

## 2015-12-25 DIAGNOSIS — Z7689 Persons encountering health services in other specified circumstances: Secondary | ICD-10-CM

## 2015-12-25 DIAGNOSIS — F329 Major depressive disorder, single episode, unspecified: Secondary | ICD-10-CM | POA: Diagnosis not present

## 2015-12-25 DIAGNOSIS — Z7189 Other specified counseling: Secondary | ICD-10-CM | POA: Diagnosis not present

## 2015-12-25 DIAGNOSIS — F32A Depression, unspecified: Secondary | ICD-10-CM

## 2015-12-25 MED ORDER — TRAZODONE HCL 50 MG PO TABS: ORAL_TABLET | ORAL | Status: DC

## 2015-12-25 NOTE — Progress Notes (Signed)
Patient ID: [name], male   DOB: May 18, 1946, 70 y.o.   MRN: [id number]      Patient ID: [name], male  DOB: [birth year], 70 y.o.   MRN: [id number]  Subjective:  [name] is a 70 y.o. male present for new patient establishment.  All past medical history, surgical history, allergies, family history, immunizations, medications and social history were obtained and entered  in the electronic medical record today. All recent labs, ED visits and hospitalizations within the last year were reviewed.  Pt is transferring from [place]. There are no records prior to visit to review, they are requested and will be entered into electronic medical record at that time. Patient has a significant history of MI with stent placement LAD in 2007. Former smoker quit 2000. Straightening of elevated lipids on a statin. Hypertension that appears to be well controlled by cardiology notes. COPD, but states that inhaler do not work. Type 2 diabetes on metformin. 10/13/13 normal myovue EF 67% Ordered by PA for dyspnea.   Depression: pt states he was diagnosed 5 years ago and managed with PCP and prescribed Wellbutrin 300 mg daily. He was also on imipramine at bedtime. He stopped imipramine a few months ago because it was not working, he was hallucinating on it and [employer] told  Him it had reactions with other meds. He endorses having trouble falling asleep, with multiple awakenings throughout the night some secondary to urinary needs, either secondary to what he feels is from his depression. Patient is unable to give history on depression other than that started roughly 5 years ago. He gets teary-eyed, and choked up when attempting to discuss any life event/trigger. He has never seen a therapist or psychologist.  Health maintenance:  Colonoscopy: History of colon polyps, states he is passed due for of followup.  Immunizations: Flu UTD 2016, unknown other immunizations Infectious disease screening: unknown PSA:  unknown Depression screen [facility] 2/9 12/25/2015 12/25/2015  Decreased Interest 3 3  Down, Depressed, Hopeless 3 3  PHQ - 2 Score 6 6  Altered sleeping 3 2  Tired, decreased energy 3 2  Change in appetite 3 2  Feeling bad or failure about yourself  1 1  Trouble concentrating 1 2  Moving slowly or fidgety/restless 1 1  Suicidal thoughts 0 1  PHQ-9 Score 18 17  Difficult doing work/chores Somewhat difficult Somewhat difficult    GAD 7 : Generalized Anxiety Score 12/25/2015  Nervous, Anxious, on Edge 2  Control/stop worrying 2  Worry too much - different things 3  Trouble relaxing 2  Restless 2  Easily annoyed or irritable 2  Afraid - awful might happen 0  Total GAD 7 Score 13  Anxiety Difficulty Somewhat difficult    Negative mood disorder screen.    Past Medical History  Diagnosis Date  . Depression   . GERD (gastroesophageal reflux disease)   . Hypercholesterolemia   . Obesity   . HTN (hypertension)   . Dyspnea   . CAD (coronary artery disease)     ETT/Lexiscan-Myoview (11/14):  Normal; no ischemia, EF 67%  . COPD (chronic obstructive pulmonary disease) ([place])   . Colon polyp   . Myocardial infarction ([place])   . Sleep apnea   . Diabetes mellitus without complication (HCC)    Allergies  Allergen Reactions  . Codeine     Makes sick   Past Surgical History  Procedure Laterality Date  . Appendectomy    . Varicocelectomy    .  Tonsillectomy     Family History  Problem Relation Age of Onset  . Coronary artery disease    . Hypertension    . Hyperlipidemia    . Heart disease Mother   . Heart disease Father   . Stroke Father    Social History   Social History  . Marital Status: Married    Spouse Name: N/A  . Number of Children: N/A  . Years of Education: N/A   Occupational History  . machinist    Social History Main Topics  . Smoking status: Former Smoker    Types: Cigarettes    Quit date: 11/25/1998  . Smokeless tobacco: Never Used  . Alcohol Use: No    . Drug Use: No  . Sexual Activity: Yes   Other Topics Concern  . Not on file   Social History Narrative   Married to [place].   Retired Production designer, theatre/television/film truck.    Drinks caffeine,  Takes a daily vitamin, wears a seatbelt   Wears a bicycle helmet.    Has dentures (partial on top)   Smoke detector in the home. No firearms in the home.    Feels safe in relationships.       ROS: Negative, with the exception of above mentioned in HPI  Objective: BP 129/85 mmHg  Pulse 68  Temp(Src) 98.1 F (36.7 C) (Oral)  Resp 20  Wt 203 lb 12 oz (92.42 kg)  SpO2 98%  Body mass index is 29.23 kg/(m^2). Todd: Afebrile. No acute distress. Nontoxic in appearance, well-developed, well-nourished, Caucasian male, pleasant HENT: AT. [place]. MMM, no oral lesions, good dentition.  Eyes:Pupils Equal Round Reactive to light, Extraocular movements intact,  Conjunctiva without redness, discharge or icterus. Neck/lymp/endocrine: Supple, no lymphadenopathy CV: RRR . Murmurs, clicks, gallops or rubs, no edema, +2/4 P posterior tibialis pulses.  No JVD. Chest: CTAB, no wheeze, rhonchi or crackles. Normal Respiratory effort. Good Air movement. Abd: Soft. Flat. NTND. BS present. No Masses palpated. No hepatosplenomegaly. No rebound tenderness or guarding. Skin: No rashes, purpura or petechiae. Warm and well-perfused. Skin intact. Neuro/Msk: Normal gait. PERLA. EOMi. Alert. Oriented x3.   Psych: Normal dress. Tearful with depression discussion. Unable to verbalize depression history. Normal speech. Normal thought content and judgment.  Assessment/plan: [name] is a 70 y.o. male present for establishment of care with complaints of depression. Records requested from prior PCP.   Health maintenance:  Colonoscopy: History of colon polyps, states he is passed due for of followup. --> encouraged to contact GI and schedule Immunizations: Flu UTD 2016, unknown other immunizations Infectious disease screening: unknown PSA:  unknown  Depression - GAD 13, PHQ 17 - Pt became very teary eyed and was at a loss for words when attempting  to discuss depression. Encouraged therapy, but he would like to wait. Trial of trazodone to add to regimen, since he is having difficulty sleeping.  - traZODone (DESYREL) 50 MG tablet; Take 50 mg QHS for 1 week, if needed may increase to 100 mg Qhs  Dispense: 60 tablet; Refill: 0 - F/U 3 weeks after med start   Return in about 3 weeks (around 01/15/2016), or depresson/DM, for Office visit.   Howard Pouch, DO [place]

## 2015-12-25 NOTE — Patient Instructions (Signed)
It was a pleasure meeting you.  Once we receive records we will let you know if we need up date any immunizations or screenings.  We will start trazodone, 50 mg 1 hour before bed for 1 week, then increase to 100 mg. Follow up in 3 weeks.

## 2016-01-08 ENCOUNTER — Encounter: Payer: Self-pay | Admitting: Family Medicine

## 2016-01-08 DIAGNOSIS — N189 Chronic kidney disease, unspecified: Secondary | ICD-10-CM | POA: Insufficient documentation

## 2016-01-09 ENCOUNTER — Encounter: Payer: Self-pay | Admitting: Family Medicine

## 2016-01-09 DIAGNOSIS — J449 Chronic obstructive pulmonary disease, unspecified: Secondary | ICD-10-CM | POA: Insufficient documentation

## 2016-01-15 ENCOUNTER — Encounter: Payer: Self-pay | Admitting: Family Medicine

## 2016-01-15 ENCOUNTER — Ambulatory Visit (INDEPENDENT_AMBULATORY_CARE_PROVIDER_SITE_OTHER): Payer: Commercial Managed Care - HMO | Admitting: Family Medicine

## 2016-01-15 VITALS — BP 103/66 | HR 71 | Temp 98.4°F | Resp 20 | Wt 200.0 lb

## 2016-01-15 DIAGNOSIS — F329 Major depressive disorder, single episode, unspecified: Secondary | ICD-10-CM | POA: Diagnosis not present

## 2016-01-15 DIAGNOSIS — I10 Essential (primary) hypertension: Secondary | ICD-10-CM

## 2016-01-15 DIAGNOSIS — F32A Depression, unspecified: Secondary | ICD-10-CM

## 2016-01-15 DIAGNOSIS — E119 Type 2 diabetes mellitus without complications: Secondary | ICD-10-CM

## 2016-01-15 DIAGNOSIS — Z23 Encounter for immunization: Secondary | ICD-10-CM | POA: Diagnosis not present

## 2016-01-15 DIAGNOSIS — Z0000 Encounter for general adult medical examination without abnormal findings: Secondary | ICD-10-CM

## 2016-01-15 DIAGNOSIS — I252 Old myocardial infarction: Secondary | ICD-10-CM | POA: Insufficient documentation

## 2016-01-15 LAB — BASIC METABOLIC PANEL
BUN: 34 mg/dL — ABNORMAL HIGH (ref 6–23)
CALCIUM: 9.5 mg/dL (ref 8.4–10.5)
CO2: 22 meq/L (ref 19–32)
CREATININE: 1.56 mg/dL — AB (ref 0.40–1.50)
Chloride: 106 mEq/L (ref 96–112)
GFR: 47.24 mL/min — ABNORMAL LOW (ref >60.00)
GLUCOSE: 176 mg/dL — AB (ref 70–99)
Potassium: 4.9 mEq/L (ref 3.5–5.1)
Sodium: 136 mEq/L (ref 135–145)

## 2016-01-15 LAB — CBC WITH DIFFERENTIAL/PLATELET
BASOS ABS: 0 10*3/uL (ref 0.0–0.1)
Basophils Relative: 0.3 % (ref 0.0–3.0)
EOS ABS: 0.2 10*3/uL (ref 0.0–0.7)
Eosinophils Relative: 2.4 % (ref 0.0–5.0)
HCT: 27.8 % — ABNORMAL LOW (ref 39.0–52.0)
Hemoglobin: 8.9 g/dL — ABNORMAL LOW (ref 13.0–17.0)
LYMPHS ABS: 1.3 10*3/uL (ref 0.7–4.0)
Lymphocytes Relative: 18.6 % (ref 12.0–46.0)
MCHC: 31.9 g/dL (ref 30.0–36.0)
MCV: 72.6 fl — ABNORMAL LOW (ref 78.0–100.0)
MONO ABS: 0.4 10*3/uL (ref 0.1–1.0)
Monocytes Relative: 5.2 % (ref 3.0–12.0)
NEUTROS ABS: 5.2 10*3/uL (ref 1.4–7.7)
NEUTROS PCT: 73.5 % (ref 43.0–77.0)
PLATELETS: 249 10*3/uL (ref 150.0–400.0)
RBC: 3.82 Mil/uL — ABNORMAL LOW (ref 4.22–5.81)
RDW: 18.2 % — ABNORMAL HIGH (ref 11.5–15.5)
WBC: 7.1 10*3/uL (ref 4.0–10.5)

## 2016-01-15 LAB — HEMOGLOBIN A1C: HEMOGLOBIN A1C: 6.3 % (ref 4.6–6.5)

## 2016-01-15 MED ORDER — ZOSTER VACCINE LIVE 19400 UNT/0.65ML ~~LOC~~ SOLR: 0.6500 mL | Freq: Once | SUBCUTANEOUS | Status: DC

## 2016-01-15 MED ORDER — TRAZODONE HCL 50 MG PO TABS: 150.0000 mg | ORAL_TABLET | Freq: Every day | ORAL | Status: DC

## 2016-01-15 NOTE — Patient Instructions (Addendum)
Hypoglycemia Hypoglycemia occurs when the glucose in your blood is too low. Glucose is a type of sugar that is your body's main energy source. Hormones, such as insulin and glucagon, control the level of glucose in the blood. Insulin lowers blood glucose and glucagon increases blood glucose. Having too much insulin in your blood stream, or not eating enough food containing sugar, can result in hypoglycemia. Hypoglycemia can happen to people with or without diabetes. It can develop quickly and can be a medical emergency.  CAUSES   Missing or delaying meals.  Not eating enough carbohydrates at meals.  Taking too much diabetes medicine.  Not timing your oral diabetes medicine or insulin doses with meals, snacks, and exercise.  Nausea and vomiting.  Certain medicines.  Severe illnesses, such as hepatitis, kidney disorders, and certain eating disorders.  Increased activity or exercise without eating something extra or adjusting medicines.  Drinking too much alcohol.  A nerve disorder that affects body functions like your heart rate, blood pressure, and digestion (autonomic neuropathy).  A condition where the stomach muscles do not function properly (gastroparesis). Therefore, medicines and food may not absorb properly.  Rarely, a tumor of the pancreas can produce too much insulin. SYMPTOMS   Hunger.  Sweating (diaphoresis).  Change in body temperature.  Shakiness.  Headache.  Anxiety.  Lightheadedness.  Irritability.  Difficulty concentrating.  Dry mouth.  Tingling or numbness in the hands or feet.  Restless sleep or sleep disturbances.  Altered speech and coordination.  Change in mental status.  Seizures or prolonged convulsions.  Combativeness.  Drowsiness (lethargic).  Weakness.  Increased heart rate or palpitations.  Confusion.  Pale, gray skin color.  Blurred or double vision.  Fainting. DIAGNOSIS  A physical exam and medical history will be  performed. Your caregiver may make a diagnosis based on your symptoms. Blood tests and other lab tests may be performed to confirm a diagnosis. Once the diagnosis is made, your caregiver will see if your signs and symptoms go away once your blood glucose is raised.  TREATMENT  Usually, you can easily treat your hypoglycemia when you notice symptoms.  Check your blood glucose. If it is less than 70 mg/dl, take one of the following:   3-4 glucose tablets.    cup juice.    cup regular soda.   1 cup skim milk.   -1 tube of glucose gel.   5-6 hard candies.   Avoid high-fat drinks or food that may delay a rise in blood glucose levels.  Do not take more than the recommended amount of sugary foods, drinks, gel, or tablets. Doing so will cause your blood glucose to go too high.   Wait 10-15 minutes and recheck your blood glucose. If it is still less than 70 mg/dl or below your target range, repeat treatment.   Eat a snack if it is more than 1 hour until your next meal.  There may be a time when your blood glucose may go so low that you are unable to treat yourself at home when you start to notice symptoms. You may need someone to help you. You may even faint or be unable to swallow. If you cannot treat yourself, someone will need to bring you to the hospital.  HOME CARE INSTRUCTIONS  If you have diabetes, follow your diabetes management plan by:  Taking your medicines as directed.  Following your exercise plan.  Following your meal plan. Do not skip meals. Eat on time.  Testing your blood   glucose regularly. Check your blood glucose before and after exercise. If you exercise longer or different than usual, be sure to check blood glucose more frequently.  Wearing your medical alert jewelry that says you have diabetes.  Identify the cause of your hypoglycemia. Then, develop ways to prevent the recurrence of hypoglycemia.  Do not take a hot bath or shower right after an  insulin shot.  Always carry treatment with you. Glucose tablets are the easiest to carry.  If you are going to drink alcohol, drink it only with meals.  Tell friends or family members ways to keep you safe during a seizure. This may include removing hard or sharp objects from the area or turning you on your side.  Maintain a healthy weight. SEEK MEDICAL CARE IF:   You are having problems keeping your blood glucose in your target range.  You are having frequent episodes of hypoglycemia.  You feel you might be having side effects from your medicines.  You are not sure why your blood glucose is dropping so low.  You notice a change in vision or a new problem with your vision. SEEK IMMEDIATE MEDICAL CARE IF:   Confusion develops.  A change in mental status occurs.  The inability to swallow develops.  Fainting occurs.   This information is not intended to replace advice given to you by your health care provider. Make sure you discuss any questions you have with your health care provider.   Document Released: 11/11/2005 Document Revised: 11/16/2013 Document Reviewed: 07/18/2015 Elsevier Interactive Patient Education [employer].   I will see you back in 3 months, unless you need to see me sooner for depression. Increase trazodone to 150 mg daily. Call in 3 weeks, if taking 150 mg every night I will call in the higher dose pill for your refills.  I will make a referral to psychology for you, try it.  I will call you with lab results once available and we will discuss your diabetes management.

## 2016-01-15 NOTE — Progress Notes (Signed)
Patient ID: LABRIAN BARSAMIAN, male   DOB: 19-Jul-1946, 70 y.o.   MRN: [id number]      Patient ID: RADAMES KARI, male  DOB: [birth year], 70 y.o.   MRN: [id number]  Subjective:  CHARLES BRINKERHOFF is a 70 y.o. male present for new patient establishment.   Depression: Patient presents for depression follow-up after starting trazodone 4 weeks ago. Patient had been prescribed Wellbutrin 300 mg daily by his prior PCP, his continue to take this medication as well. By review of prior notes patient has been tried on multiple different therapies with side effects. He had been referred to psychiatry but did not schedule appointment. Patient states with the addition of the trazodone he has been able to sleep better at night. He feels an improvement in his depression with use of the trazodone. He denies any negative side effects to this medication. Patient is amendable to psychology referral today.     Diabetes: last a1c 08/15/15 7.0. Patient has been on metformin 24 hour, 500 mg 3 times a day. He reports compliance with his medication, and no negative side effects. His last GFR was 45 and September. Patient reports his blood sugars are anywhere from 70-100, without symptoms of dizziness, flushing or shakiness. Patient  states he has lost weight over the last few months intentionally. He denies any numbness and tingling in his lower extremities. He denies any nonhealing wounds. He is overdue for an ophthalmology/diabetic eye exam, he states that he is switching his ophthalmologist and is in the process of scheduling his appointment.  HTN: Patient reports compliance with valsartan, metoprolol and follows with cardiology. He is starting to exercise more frequently. He states he has lost some weight. He does watch the salt content in his diet. He denies any chest pain, shortness of breath, dizziness or lower extremity edema. By records he does have chronic kidney disease, stage III. Patient is on a daily baby aspirin, and  Lipitor 80 mg. Patient has a history of MI.  Of note: At the end of our appointment patient complains of arthritic type pain in his back, neck and knee. Advised him to attempt Tylenol use daily, avoid NSAIDs and it continues to bother him he will need to make an appointment to discuss further.    Depression screen [facility] 2/9 12/25/2015 12/25/2015  Decreased Interest 3 3  Down, Depressed, Hopeless 3 3  PHQ - 2 Score 6 6  Altered sleeping 3 2  Tired, decreased energy 3 2  Change in appetite 3 2  Feeling bad or failure about yourself  1 1  Trouble concentrating 1 2  Moving slowly or fidgety/restless 1 1  Suicidal thoughts 0 1  PHQ-9 Score 18 17  Difficult doing work/chores Somewhat difficult Somewhat difficult    GAD 7 : Generalized Anxiety Score 12/25/2015  Nervous, Anxious, on Edge 2  Control/stop worrying 2  Worry too much - different things 3  Trouble relaxing 2  Restless 2  Easily annoyed or irritable 2  Afraid - awful might happen 0  Total GAD 7 Score 13  Anxiety Difficulty Somewhat difficult    Negative mood disorder screen.    Past Medical History  Diagnosis Date  . Depression   . GERD (gastroesophageal reflux disease)   . Hypercholesterolemia   . Obesity   . HTN (hypertension)   . Dyspnea   . CAD (coronary artery disease)     ETT/Lexiscan-Myoview (11/14):  Normal; no ischemia, EF 67%  . COPD (chronic obstructive pulmonary  disease) ([place]) 2009    spirometry with "moderate COPD"  . Colon polyp   . Myocardial infarction ([place])   . Sleep apnea   . Diabetes mellitus without complication ([place])   . Vertigo   . Adenomatous colon polyp 2011  . CKD (chronic kidney disease), stage III   . GI bleed     after polypectomy/admitted  . Barrett esophagus 12/2013    EGD   Allergies  Allergen Reactions  . Altace [Ramipril] Cough  . Codeine     Makes sick  . Tape     Blisters   . Zantac [Ranitidine Hcl] Dermatitis   Past Surgical History  Procedure Laterality Date  .  Appendectomy    . Varicocelectomy    . Tonsillectomy    . Pleural scarification  1987   Family History  Problem Relation Age of Onset  . Coronary artery disease Father 35  . Hypertension    . Hyperlipidemia    . Heart disease Mother   . Heart disease Father   . Stroke Father   . CVA Mother   . Cancer - Other Brother     Renal cell carcinoma  . Colon polyps Sister    Social History   Social History  . Marital Status: Married    Spouse Name: N/A  . Number of Children: N/A  . Years of Education: N/A   Occupational History  . machinist    Social History Main Topics  . Smoking status: Former Smoker    Types: Cigarettes    Quit date: 11/25/1998  . Smokeless tobacco: Never Used  . Alcohol Use: No  . Drug Use: No  . Sexual Activity: Yes   Other Topics Concern  . Not on file   Social History Narrative   Married to [place].   Retired Production designer, theatre/television/film truck.    Drinks caffeine,  Takes a daily vitamin, wears a seatbelt   Wears a bicycle helmet.    Has dentures (partial on top)   Smoke detector in the home. No firearms in the home.    Feels safe in relationships.       ROS: Negative, with the exception of above mentioned in HPI  Objective: BP 103/66 mmHg  Pulse 71  Temp(Src) 98.4 F (36.9 C)  Resp 20  Wt 200 lb (90.719 kg)  SpO2 98%  Body mass index is 28.7 kg/(m^2). Gen: Afebrile. No acute distress. Nontoxic in appearance, well-developed, well-nourished, Caucasian male, pleasant HENT: AT. [place]. MMM Eyes:Pupils Equal Round Reactive to light, Extraocular movements intact,  Conjunctiva without redness, discharge or icterus. CV: RRR . Murmurs, clicks, gallops or rubs, no edema Chest: CTAB, no wheeze, rhonchi or crackles. Normal Respiratory effort. Good Air movement. Abd: Soft. Flat. NTND. BS present. No Masses palpated. No hepatosplenomegaly. No rebound tenderness or guarding. Neuro/Msk: Normal gait. PERLA. EOMi. Alert. Oriented x3.   Psych: Normal dress, affect and demeanor  today.Normal thought content, speech and judgment.  Assessment/plan: NATHANE SABALLOS is a 70 y.o. male present for followup on depression. 1. Depression - Improved. Patient to increase to 150 mg of trazodone daily at bedtime. - traZODone (DESYREL) 50 MG tablet; Take 3 tablets (150 mg total) by mouth at bedtime. Take 50 mg QHS for 1 week, if needed may increase to 100 mg Qhs  Dispense: 90 tablet; Refill: 0 - Patient amendable to psychology referral, this was placed for him today. - Ambulatory referral to Psychology  2. Health care maintenance - Tdap Administered today. - zoster vaccine  live, PF, (ZOSTAVAX) 16109 UNT/0.65ML injection; Inject 19,400 Units into the skin once.  Dispense: 1 each; Refill: 0   3. Essential hypertension - Patient reports compliance with medications, continue follow with cardiology. - Basic Metabolic Panel (BMET) - CBC w/Diff - HgB A1c  4. Type 2 diabetes mellitus without complication, without long-term current use of insulin (HCC) - Last A1c 7.0, patient on metformin 500 mg 3 times a day (24 hour). Patient has lost weight since the start of his metformin. He also has chronic kidney disease, we'll obtain BMP today to evaluate safety of metformin with his chronic kidney disease. - Basic Metabolic Panel (BMET) - CBC w/Diff - HgB A1c  5. Need for prophylactic vaccination with combined diphtheria-tetanus-pertussis (DTP) vaccine - Tdap vaccine greater than or equal to 7yo IM  Follow-up 3 months, A1c before rooming  No Follow-up on file.   Howard Pouch, DO [place]

## 2016-01-16 ENCOUNTER — Telehealth: Payer: Self-pay | Admitting: Family Medicine

## 2016-01-16 DIAGNOSIS — D649 Anemia, unspecified: Secondary | ICD-10-CM

## 2016-01-16 NOTE — Telephone Encounter (Signed)
Please call pt: - his a1c was 6.3, he will need to remain on the metformin for his diabetes. Follow up in 3 mos.  - his kidney function remains stable from prior, although decreased (CKD3). - He does have anemia (8.9), low RBC count. This is currently lower than when he was admitted for GI bleed in 2012. If pt is having any BM changes or rectal bleeding he needs to contact his GI doctor immediately for urgent appt., he had been encouraged to follow up with GI anyway since he was overdue for his rpt colonoscopy. If he is having any fatigue, chest pain or shortness of breath he needs to be seen in the ED.  - Otherwise he does need to have an anemia panel collected ASAP, as well hemoccult cards. These have been placed in future orders (may need scheduled somewhere else with Lattie Haw on leave).

## 2016-01-17 ENCOUNTER — Other Ambulatory Visit: Payer: Self-pay | Admitting: Family Medicine

## 2016-01-17 DIAGNOSIS — D649 Anemia, unspecified: Secondary | ICD-10-CM

## 2016-01-17 NOTE — Telephone Encounter (Signed)
Spoke with patient reviewed lab results and instructions. Patient denies seeing any blood in his stool or having any symptoms of chest pain, SHOB or fatigue at this time. Patient will make an appt with GI for colonoscopy. Patient will go to [facility] to get lab draw today or tomorrow.

## 2016-01-18 ENCOUNTER — Other Ambulatory Visit: Payer: Self-pay | Admitting: Family Medicine

## 2016-01-18 DIAGNOSIS — D649 Anemia, unspecified: Secondary | ICD-10-CM

## 2016-01-24 ENCOUNTER — Other Ambulatory Visit (INDEPENDENT_AMBULATORY_CARE_PROVIDER_SITE_OTHER): Payer: Commercial Managed Care - HMO

## 2016-01-24 ENCOUNTER — Other Ambulatory Visit: Payer: Self-pay | Admitting: *Deleted

## 2016-01-24 DIAGNOSIS — D649 Anemia, unspecified: Secondary | ICD-10-CM | POA: Diagnosis not present

## 2016-01-24 LAB — FECAL OCCULT BLOOD, IMMUNOCHEMICAL: FECAL OCCULT BLD: NEGATIVE

## 2016-01-25 ENCOUNTER — Telehealth: Payer: Self-pay | Admitting: Family Medicine

## 2016-01-25 NOTE — Telephone Encounter (Signed)
Please call pt: (anemia work up) - His FOBT was negative for blood. - I can not see if he made an appt with GI yet, if not please encourage him to do so. - I also do not see where he had any of the labs we ordered completed. Please advise.

## 2016-01-26 NOTE — Telephone Encounter (Signed)
Spoke with patient reviewed results patient states he got his lab work drawn at [facility]. Reminded patient to schedule an appt with GI.

## 2016-01-29 ENCOUNTER — Telehealth: Payer: Self-pay | Admitting: Family Medicine

## 2016-01-29 DIAGNOSIS — D509 Iron deficiency anemia, unspecified: Secondary | ICD-10-CM

## 2016-01-29 MED ORDER — FERROUS SULFATE 325 (65 FE) MG PO TABS: 325.0000 mg | ORAL_TABLET | Freq: Three times a day (TID) | ORAL | Status: DC

## 2016-01-29 NOTE — Telephone Encounter (Signed)
Please call pt concerning labs: - Pt hs low iron. Iron deficiency anemia as profound as he is experiencing, is usually caused by intestinal loss of blood. He needs to be seen by his GI doc. I have asked him to schedule this twice. (Dr. Michail Sermon and performed colonoscopy in 2011).This needs to be evaluated ASAP. I have placed a referral to ensure it is being followed through.   - he needs to not use any  NSAIDS until he is evaluated by GI. - would encouraged him to contact cardiology concerning ASA and plavix use with low hgb - I have called in iron supplementation for him to start today (TID).  - again, any chest pain , shortness of breath, dizziness he needs to go to ED.

## 2016-01-29 NOTE — Telephone Encounter (Signed)
Spoke to patient. Went over results/instructions per Dr. Lucita Lora previous note. Patient verbalized understanding. Requested to schedule appt this week. Having URI symptoms. Scheduled patient for OV Wednesday, 01/31/16.

## 2016-01-31 ENCOUNTER — Encounter: Payer: Self-pay | Admitting: Family Medicine

## 2016-01-31 ENCOUNTER — Ambulatory Visit (INDEPENDENT_AMBULATORY_CARE_PROVIDER_SITE_OTHER): Payer: Commercial Managed Care - HMO | Admitting: Family Medicine

## 2016-01-31 VITALS — BP 105/64 | HR 66 | Temp 98.5°F | Ht 70.0 in | Wt 193.8 lb

## 2016-01-31 DIAGNOSIS — J0100 Acute maxillary sinusitis, unspecified: Secondary | ICD-10-CM

## 2016-01-31 DIAGNOSIS — K5900 Constipation, unspecified: Secondary | ICD-10-CM

## 2016-01-31 DIAGNOSIS — K5901 Slow transit constipation: Secondary | ICD-10-CM | POA: Insufficient documentation

## 2016-01-31 DIAGNOSIS — D509 Iron deficiency anemia, unspecified: Secondary | ICD-10-CM | POA: Diagnosis not present

## 2016-01-31 MED ORDER — PREDNISONE 50 MG PO TABS: 50.0000 mg | ORAL_TABLET | Freq: Every day | ORAL | Status: DC

## 2016-01-31 MED ORDER — SENNOSIDES-DOCUSATE SODIUM 8.6-50 MG PO TABS: 2.0000 | ORAL_TABLET | Freq: Every day | ORAL | Status: DC

## 2016-01-31 MED ORDER — AZITHROMYCIN 250 MG PO TABS: ORAL_TABLET | ORAL | Status: DC

## 2016-01-31 NOTE — Patient Instructions (Signed)
Mucinex, Humidifier, nasal saline flushes x3 /d use Z-pack, predisone (50 mg) daily with food Senna- D is a stool softener, take as long as able and especially with iron use Make certain to start the iron 3x a day when you receive it from mail in pharmacy. If you get shortness of breath, chest pain or dizziness, or see blood in stool/toilet please be seen immediately.   Make colonoscopy appt with GI ASAP for anemia concerns.  Iron Deficiency Anemia, Adult Anemia is a condition in which there are less red blood cells or hemoglobin in the blood than normal. Hemoglobin is the part of red blood cells that carries oxygen. Iron deficiency anemia is anemia caused by too little iron. It is the most common type of anemia. It may leave you tired and short of breath. CAUSES   Lack of iron in the diet.  Poor absorption of iron, as seen with intestinal disorders.  Intestinal bleeding.  Heavy periods. SIGNS AND SYMPTOMS  Mild anemia may not be noticeable. Symptoms may include:  Fatigue.  Headache.  Pale skin.  Weakness.  Tiredness.  Shortness of breath.  Dizziness.  Cold hands and feet.  Fast or irregular heartbeat. DIAGNOSIS  Diagnosis requires a thorough evaluation and physical exam by your health care provider. Blood tests are generally used to confirm iron deficiency anemia. Additional tests may be done to find the underlying cause of your anemia. These may include:  Testing for blood in the stool (fecal occult blood test).  A procedure to see inside the colon and rectum (colonoscopy).  A procedure to see inside the esophagus and stomach (endoscopy). TREATMENT  Iron deficiency anemia is treated by correcting the cause of the deficiency. Treatment may involve:  Adding iron-rich foods to your diet.  Taking iron supplements. Pregnant or breastfeeding women need to take extra iron because their normal diet usually does not provide the required amount.  Taking vitamins. Vitamin  C improves the absorption of iron. Your health care provider may recommend that you take your iron tablets with a glass of orange juice or vitamin C supplement.  Medicines to make heavy menstrual flow lighter.  Surgery. HOME CARE INSTRUCTIONS   Take iron as directed by your health care provider.  If you cannot tolerate taking iron supplements by mouth, talk to your health care provider about taking them through a vein (intravenously) or an injection into a muscle.  For the best iron absorption, iron supplements should be taken on an empty stomach. If you cannot tolerate them on an empty stomach, you may need to take them with food.  Do not drink milk or take antacids at the same time as your iron supplements. Milk and antacids may interfere with the absorption of iron.  Iron supplements can cause constipation. Make sure to include fiber in your diet to prevent constipation. A stool softener may also be recommended.  Take vitamins as directed by your health care provider.  Eat a diet rich in iron. Foods high in iron include liver, lean beef, whole-grain bread, eggs, dried fruit, and dark green leafy vegetables. SEEK IMMEDIATE MEDICAL CARE IF:   You faint. If this happens, do not drive. Call your local emergency services (911 in [place] if no other help is available.  You have chest pain.  You feel nauseous or vomit.  You have severe or increased shortness of breath with activity.  You feel weak.  You have a rapid heartbeat.  You have unexplained sweating.  You become light-headed  when getting up from a chair or bed. MAKE SURE YOU:   Understand these instructions.  Will watch your condition.  Will get help right away if you are not doing well or get worse.   This information is not intended to replace advice given to you by your health care provider. Make sure you discuss any questions you have with your health care provider.   Document Released: 11/08/2000 Document  Revised: 12/02/2014 Document Reviewed: 07/19/2013 Elsevier Interactive Patient Education [employer].

## 2016-01-31 NOTE — Progress Notes (Signed)
Patient ID: [name], male   DOB: Jul 07, 1946, 70 y.o.   MRN: [id number]    [name] , 01-19-1946, 70 y.o., male MRN: [id number]  CC: cough  Subjective: Pt presents for an acute OV with complaints of cough 2 weeks of duration. Associated symptoms include rhinorrhea, nasal congestion, PND, chills, nausea, mild sore throat.  Pt has tried Mucinex to ease their symptoms, which helped a little bit.  denies fevers, diarrhea,vomit, no headache, facial tenderness or shortness of breath. He has COPD, denies increase shortness of breath or wheezing.   Anemia: Discussed all labs and anemia in person today. Pt is waiting on this provider to placed on medicare card in order to schedule GI rerferral, which was placed urgently. He has not received iron supplement as of yet. He was encouraged to speak to cardiology about plavix/ASA. Pt does have CKD stage 2-3, history of MI and stent placement. He continues to deny blood in stool, but has history of GI bleed and hgb is 8.9. He is asymptomatic, with the exception of mild fatigue.   Allergies  Allergen Reactions  . Altace [Ramipril] Cough  . Codeine     Makes sick  . Tape     Blisters   . Zantac [Ranitidine Hcl] Dermatitis   Social History  Substance Use Topics  . Smoking status: Former Smoker    Types: Cigarettes    Quit date: 11/25/1998  . Smokeless tobacco: Never Used  . Alcohol Use: No   Past Medical History  Diagnosis Date  . Depression   . GERD (gastroesophageal reflux disease)   . Hypercholesterolemia   . Obesity   . HTN (hypertension)   . Dyspnea   . CAD (coronary artery disease)     ETT/Lexiscan-Myoview (11/14):  Normal; no ischemia, EF 67%  . COPD (chronic obstructive pulmonary disease) ([place]) 2009    spirometry with "moderate COPD"  . Colon polyp   . Myocardial infarction ([place])   . Sleep apnea   . Diabetes mellitus without complication ([place])   . Vertigo   . Adenomatous colon polyp 2011  . CKD (chronic kidney disease),  stage III   . GI bleed     after polypectomy/admitted  . Barrett esophagus 12/2013    EGD   Past Surgical History  Procedure Laterality Date  . Appendectomy    . Varicocelectomy    . Tonsillectomy    . Pleural scarification  1987   Family History  Problem Relation Age of Onset  . Coronary artery disease Father 48  . Hypertension    . Hyperlipidemia    . Heart disease Mother   . Heart disease Father   . Stroke Father   . CVA Mother   . Cancer - Other Brother     Renal cell carcinoma  . Colon polyps Sister      Medication List       This list is accurate as of: 01/31/16  9:48 AM.  Always use your most recent med list.               aspirin EC 81 MG tablet  Take 81 mg by mouth daily.     atorvastatin 80 MG tablet  Commonly known as:  LIPITOR  Take 1 tablet (80 mg total) by mouth daily.     buPROPion 300 MG 24 hr tablet  Commonly known as:  WELLBUTRIN XL  Take 300 mg by mouth daily.     clopidogrel 75 MG tablet  Commonly  known as:  PLAVIX  Take 75 mg by mouth daily.     ferrous sulfate 325 (65 FE) MG tablet  Take 1 tablet (325 mg total) by mouth 3 (three) times daily.     metFORMIN 500 MG 24 hr tablet  Commonly known as:  GLUCOPHAGE-XR  Take 500 mg by mouth 3 (three) times daily.     metoprolol 50 MG tablet  Commonly known as:  LOPRESSOR  Take 1 tablet (50 mg total) by mouth daily.     multivitamin tablet  Take 1 tablet by mouth daily.     nitroGLYCERIN 0.4 MG SL tablet  Commonly known as:  NITROSTAT  Place 0.4 mg under the tongue every 5 (five) minutes as needed for chest pain (3 doses max).     pantoprazole 40 MG tablet  Commonly known as:  PROTONIX  Take 40 mg by mouth daily.     SPIRIVA HANDIHALER 18 MCG inhalation capsule  Generic drug:  tiotropium  daily.     SYMBICORT 160-4.5 MCG/ACT inhaler  Generic drug:  budesonide-formoterol  Inhale 2 puffs into the lungs daily as needed (for wheezing & shortness of breath).     traZODone 50 MG  tablet  Commonly known as:  DESYREL  TAKE 3 TABLETS AT BEDTIME     valsartan 160 MG tablet  Commonly known as:  DIOVAN  TAKE 1 TABLET  DAILY.     zoster vaccine live (PF) 19400 UNT/0.65ML injection  Commonly known as:  ZOSTAVAX  Inject 19,400 Units into the skin once.       ROS: Negative, with the exception of above mentioned in HPI Objective:  BP 105/64 mmHg  Pulse 66  Temp(Src) 98.5 F (36.9 C) (Oral)  Ht 5\' 10"  (1.778 m)  Wt 193 lb 12.8 oz (87.907 kg)  BMI 27.81 kg/m2  SpO2 96% Body mass index is 27.81 kg/(m^2). Gen: Afebrile. No acute distress. Nontoxic in appearance. Well developed, well nourished, male.  HENT: AT. [place]. Bilateral TM visualized and normal in appearance. MMM, no oral lesions. Bilateral nares with erythema,bogginess and dried blood.  Throat without erythema or exudates. No cough on exam,no hoarseness on exam.  Eyes:Pupils Equal Round Reactive to light, Extraocular movements intact,  Conjunctiva without redness, discharge or icterus. Neck/lymp/endocrine: Supple,no lymphadenopathy CV: RRR Chest: CTAB, no wheeze or crackles. Good air movement, normal resp effort.  Abd: Soft. NTND. BS present  Neuro: Normal gait. PERLA. EOMi. Alert. Oriented x3 Assessment/Plan: [name] is a 70 y.o. male present for acute OV for  1. Acute maxillary sinusitis, recurrence not specified - Rest, hydrate, humidifier, nasal saline, mucinex - azithromycin (ZITHROMAX Z-PAK) 250 MG tablet; 500 mg day 1, then 250 mg QD  Dispense: 6 each; Refill: 0 - predniSONE (DELTASONE) 50 MG tablet; Take 1 tablet (50 mg total) by mouth daily with breakfast.  Dispense: 5 tablet; Refill: 0   2. Constipation, unspecified constipation type - senna-docusate (SENOKOT-S) 8.6-50 MG tablet; Take 2 tablets by mouth daily.  Dispense: 60 tablet; Refill: 3  3. Anemia, iron deficiency - discussed in detail to pt his anemia and lab results in person. Pt hgb is lower than when he had GI bleed. He has referral  to GI and is overdue for colonoscopy. He has been prescribed iron to take, he is to receive from mail in pharmacy within a few days. Also discussed anemia of chronic disease today. Senna-D to be used with iron.    electronically signed by:  Howard Pouch, DO  [facility] [place]

## 2016-02-02 ENCOUNTER — Encounter: Payer: Self-pay | Admitting: Family Medicine

## 2016-02-20 ENCOUNTER — Encounter: Payer: Self-pay | Admitting: Family Medicine

## 2016-02-20 ENCOUNTER — Other Ambulatory Visit (INDEPENDENT_AMBULATORY_CARE_PROVIDER_SITE_OTHER): Payer: Commercial Managed Care - HMO

## 2016-02-20 ENCOUNTER — Ambulatory Visit (INDEPENDENT_AMBULATORY_CARE_PROVIDER_SITE_OTHER): Payer: Commercial Managed Care - HMO | Admitting: Family Medicine

## 2016-02-20 ENCOUNTER — Telehealth: Payer: Self-pay | Admitting: Family Medicine

## 2016-02-20 ENCOUNTER — Other Ambulatory Visit: Payer: Self-pay | Admitting: Family Medicine

## 2016-02-20 VITALS — BP 106/69 | HR 70 | Temp 98.6°F | Resp 20 | Wt 192.5 lb

## 2016-02-20 DIAGNOSIS — D509 Iron deficiency anemia, unspecified: Secondary | ICD-10-CM

## 2016-02-20 DIAGNOSIS — R531 Weakness: Secondary | ICD-10-CM | POA: Insufficient documentation

## 2016-02-20 DIAGNOSIS — D649 Anemia, unspecified: Secondary | ICD-10-CM | POA: Diagnosis not present

## 2016-02-20 DIAGNOSIS — E119 Type 2 diabetes mellitus without complications: Secondary | ICD-10-CM

## 2016-02-20 DIAGNOSIS — D500 Iron deficiency anemia secondary to blood loss (chronic): Secondary | ICD-10-CM | POA: Diagnosis not present

## 2016-02-20 DIAGNOSIS — Z0000 Encounter for general adult medical examination without abnormal findings: Secondary | ICD-10-CM | POA: Diagnosis not present

## 2016-02-20 DIAGNOSIS — I10 Essential (primary) hypertension: Secondary | ICD-10-CM

## 2016-02-20 LAB — CBC WITH DIFFERENTIAL/PLATELET
BASOS ABS: 0.1 10*3/uL (ref 0.0–0.1)
BASOS PCT: 1 % (ref 0–1)
EOS ABS: 0.2 10*3/uL (ref 0.0–0.7)
EOS PCT: 3 % (ref 0–5)
HCT: 29.2 % — ABNORMAL LOW (ref 39.0–52.0)
Hemoglobin: 9.1 g/dL — ABNORMAL LOW (ref 13.0–17.0)
LYMPHS ABS: 1.3 10*3/uL (ref 0.7–4.0)
Lymphocytes Relative: 20 % (ref 12–46)
MCH: 23.2 pg — AB (ref 26.0–34.0)
MCHC: 31.2 g/dL (ref 30.0–36.0)
MCV: 74.3 fL — AB (ref 78.0–100.0)
MONOS PCT: 5 % (ref 3–12)
MPV: 9.5 fL (ref 8.6–12.4)
Monocytes Absolute: 0.3 10*3/uL (ref 0.1–1.0)
NEUTROS PCT: 71 % (ref 43–77)
Neutro Abs: 4.7 10*3/uL (ref 1.7–7.7)
PLATELETS: 233 10*3/uL (ref 150–400)
RBC: 3.93 MIL/uL — ABNORMAL LOW (ref 4.22–5.81)
RDW: 18.7 % — AB (ref 11.5–15.5)
WBC: 6.6 10*3/uL (ref 4.0–10.5)

## 2016-02-20 LAB — IRON AND TIBC
%SAT: 7 % — AB (ref 15–60)
Iron: 28 ug/dL — ABNORMAL LOW (ref 50–180)
TIBC: 379 ug/dL (ref 250–425)
UIBC: 351 ug/dL (ref 125–400)

## 2016-02-20 LAB — BASIC METABOLIC PANEL
BUN: 19 mg/dL (ref 6–23)
CHLORIDE: 106 meq/L (ref 96–112)
CO2: 25 mEq/L (ref 19–32)
Calcium: 9.2 mg/dL (ref 8.4–10.5)
Creatinine, Ser: 1.37 mg/dL (ref 0.40–1.50)
GFR: 54.86 mL/min — ABNORMAL LOW (ref >60.00)
GLUCOSE: 114 mg/dL — AB (ref 70–99)
POTASSIUM: 4.7 meq/L (ref 3.5–5.1)
SODIUM: 140 meq/L (ref 135–145)

## 2016-02-20 LAB — RETICULOCYTES
ABS RETIC: 51.1 10*3/uL (ref 19.0–186.0)
RBC.: 3.93 MIL/uL — AB (ref 4.22–5.81)
RETIC CT PCT: 1.3 % (ref 0.4–2.3)

## 2016-02-20 LAB — FOLATE: Folate: >23.3 ng/mL (ref >5.9)

## 2016-02-20 LAB — VITAMIN B12
Vitamin B-12: 1282 pg/mL — ABNORMAL HIGH (ref 200–1100)
Vitamin B-12: 1170 pg/mL — ABNORMAL HIGH (ref 211–911)

## 2016-02-20 LAB — FERRITIN
Ferritin: 5 ng/mL — ABNORMAL LOW (ref 20–380)
Ferritin: 3.9 ng/mL — ABNORMAL LOW (ref 22.0–322.0)

## 2016-02-20 MED ORDER — ZOSTER VACCINE LIVE 19400 UNT/0.65ML ~~LOC~~ SOLR: 0.6500 mL | Freq: Once | SUBCUTANEOUS | Status: DC

## 2016-02-20 NOTE — Telephone Encounter (Addendum)
Please call pt: - his labs are consistent with prior collection. He is significantly anemic and iron deficient.  - I have referred to Hematology to evaluate and treat. If any worsening symptoms or chest pain, pt needs to be seen in ED immediately.  - please have follow up in 1 week for BP recheck with nurse (decreased diovan today secondary to low BP)

## 2016-02-20 NOTE — Progress Notes (Signed)
Patient ID: [name], male   DOB: June 22, 1946, 70 y.o.   MRN: [id number]    [name] , 03/04/1946, 70 y.o., male MRN: [id number]  CC: fatigue Subjective:   Anemia: Pt returns to office today for continued weakness and fatigue. He had a Hgb of 8.9 on his last visit. He denies worsening symptoms,but states there is no improvement. He does endorse dizziness upon standing now. He denies chest pain, increased SOB (from prior with COPD), syncope or palpitations. He has had a h/o GI bleed in the past, needing hospitalization. He is overdue for his colonoscopy. He continues to deny blood in his stool. He was FOBT negative on last visit. He was referred urgently to GI over a month ago, and has an appt next week. He has started his iron supplementation. He was encouraged to speak to cardiology about plavix/ASA. Pt does have CKD stage 2-3, history of MI and stent placement.   Allergies  Allergen Reactions  . Altace [Ramipril] Cough  . Codeine     Makes sick  . Tape     Blisters   . Zantac [Ranitidine Hcl] Dermatitis   Social History  Substance Use Topics  . Smoking status: Former Smoker    Types: Cigarettes    Quit date: 11/25/1998  . Smokeless tobacco: Never Used  . Alcohol Use: No   Past Medical History  Diagnosis Date  . Depression   . GERD (gastroesophageal reflux disease)   . Hypercholesterolemia   . Obesity   . HTN (hypertension)   . Dyspnea   . CAD (coronary artery disease)     ETT/Lexiscan-Myoview (11/14):  Normal; no ischemia, EF 67%  . COPD (chronic obstructive pulmonary disease) ([place]) 2009    spirometry with "moderate COPD"  . Colon polyp   . Myocardial infarction ([place])   . Sleep apnea   . Diabetes mellitus without complication ([place])   . Vertigo   . Adenomatous colon polyp 2011  . CKD (chronic kidney disease), stage III   . GI bleed     after polypectomy/admitted  . Barrett esophagus 12/2013    EGD   Past Surgical History  Procedure Laterality Date  .  Appendectomy    . Varicocelectomy    . Tonsillectomy    . Pleural scarification  1987   Family History  Problem Relation Age of Onset  . Coronary artery disease Father 43  . Hypertension    . Hyperlipidemia    . Heart disease Mother   . Heart disease Father   . Stroke Father   . CVA Mother   . Cancer - Other Brother     Renal cell carcinoma  . Colon polyps Sister      Medication List       This list is accurate as of: 02/20/16 10:09 AM.  Always use your most recent med list.               aspirin EC 81 MG tablet  Take 81 mg by mouth daily.     atorvastatin 80 MG tablet  Commonly known as:  LIPITOR  Take 1 tablet (80 mg total) by mouth daily.     buPROPion 300 MG 24 hr tablet  Commonly known as:  WELLBUTRIN XL  Take 300 mg by mouth daily.     clopidogrel 75 MG tablet  Commonly known as:  PLAVIX  Take 75 mg by mouth daily.     ferrous sulfate 325 (65 FE) MG tablet  Take  1 tablet (325 mg total) by mouth 3 (three) times daily.     metFORMIN 500 MG 24 hr tablet  Commonly known as:  GLUCOPHAGE-XR  Take 500 mg by mouth 3 (three) times daily.     metoprolol 50 MG tablet  Commonly known as:  LOPRESSOR  Take 1 tablet (50 mg total) by mouth daily.     multivitamin tablet  Take 1 tablet by mouth daily.     nitroGLYCERIN 0.4 MG SL tablet  Commonly known as:  NITROSTAT  Place 0.4 mg under the tongue every 5 (five) minutes as needed for chest pain (3 doses max).     pantoprazole 40 MG tablet  Commonly known as:  PROTONIX  Take 40 mg by mouth daily.     senna-docusate 8.6-50 MG tablet  Commonly known as:  Senokot-S  Take 2 tablets by mouth daily.     SPIRIVA HANDIHALER 18 MCG inhalation capsule  Generic drug:  tiotropium  daily.     SYMBICORT 160-4.5 MCG/ACT inhaler  Generic drug:  budesonide-formoterol  Inhale 2 puffs into the lungs daily as needed (for wheezing & shortness of breath).     traZODone 50 MG tablet  Commonly known as:  DESYREL  TAKE 3  TABLETS AT BEDTIME     valsartan 160 MG tablet  Commonly known as:  DIOVAN  TAKE 1 TABLET  DAILY.     zoster vaccine live (PF) 19400 UNT/0.65ML injection  Commonly known as:  ZOSTAVAX  Inject 19,400 Units into the skin once.       ROS: Negative, with the exception of above mentioned in HPI Objective:  BP 106/69 mmHg  Pulse 70  Temp(Src) 98.6 F (37 C)  Resp 20  Wt 192 lb 8 oz (87.317 kg)  SpO2 97% Body mass index is 27.62 kg/(m^2). Gen: Afebrile. No acute distress. Nontoxic in appearance. Well developed, well nourished, male. Pale.  HENT: AT. [place]. Mildly dry mucous membranes, no oral lesions. Eyes:Pupils Equal Round Reactive to light, Extraocular movements intact,  Conjunctiva without redness, discharge or icterus. Very pale conjunctiva.  Neck/lymp/endocrine: Supple,no lymphadenopathy CV: RRR Chest: CTAB, no wheeze or crackles. Good air movement, normal resp effort.  Abd: Soft. NTND. BS present  Neuro: Normal gait. PERLA. EOMi. Alert. Oriented x3    Assessment/Plan: [name] is a 70 y.o. male present for acute OV for  Anemia, iron deficiency/weakness/fatigue/weakness: - appt with GI in 1 week. Concern for colon pathology or slow bleed.  - No NSAIDS - AGAIN discussed in detail to pt his anemia and lab results in person. Pt hgb is lower than when he had GI bleed. He has referral to GI and is overdue for colonoscopy.  - He has been prescribed iron to take TID - Stressed the importance of immediate ED care if symptoms worsen. Pt understands and agrees.  - Discussed he may need a blood transfusion, and if so I will have him go to ED to receive, dependent on lab results.  - I have ordered rpt labs today cbc, anemia panel, path review, bmp. Depending in labs will either send to heme/onc or ED.  - CBC w/Diff - Retic - Ferritin - Iron Binding Cap (TIBC) - B12 - Folate - Basic Metabolic Panel (BMET) - Pathologist smear review  Health care maintenance - reprinted script  for pt. Discussed waiting until anemia improved - zoster vaccine live, PF, (ZOSTAVAX) 09811 UNT/0.65ML injection; Inject 19,400 Units into the skin once.  Dispense: 1 each; Refill: 0  Type 2 diabetes mellitus without complication, without long-term current use of insulin ([place]) - Ambulatory referral to Ophthalmology  Essential hypertension: - Pt to take 1/2 pill of diovan (80 mg). Currently with low BP. Possibly secondary to anemia. - F/U 1 week to recheck BP  F/U 1 week on BP and anemia   electronically signed by:  Howard Pouch, DO  [place]

## 2016-02-20 NOTE — Patient Instructions (Signed)
Anemia, Nonspecific Anemia is a condition in which the concentration of red blood cells or hemoglobin in the blood is below normal. Hemoglobin is a substance in red blood cells that carries oxygen to the tissues of the body. Anemia results in not enough oxygen reaching these tissues.  CAUSES  Common causes of anemia include:   Excessive bleeding. Bleeding may be internal or external. This includes excessive bleeding from periods (in women) or from the intestine.   Poor nutrition.   Chronic kidney, thyroid, and liver disease.  Bone marrow disorders that decrease red blood cell production.  Cancer and treatments for cancer.  HIV, AIDS, and their treatments.  Spleen problems that increase red blood cell destruction.  Blood disorders.  Excess destruction of red blood cells due to infection, medicines, and autoimmune disorders. SIGNS AND SYMPTOMS   Minor weakness.   Dizziness.   Headache.  Palpitations.   Shortness of breath, especially with exercise.   Paleness.  Cold sensitivity.  Indigestion.  Nausea.  Difficulty sleeping.  Difficulty concentrating. Symptoms may occur suddenly or they may develop slowly.  DIAGNOSIS  Additional blood tests are often needed. These help your health care provider determine the best treatment. Your health care provider will check your stool for blood and look for other causes of blood loss.  TREATMENT  Treatment varies depending on the cause of the anemia. Treatment can include:   Supplements of iron, vitamin 123456, or folic acid.   Hormone medicines.   A blood transfusion. This may be needed if blood loss is severe.   Hospitalization. This may be needed if there is significant continual blood loss.   Dietary changes.  Spleen removal. HOME CARE INSTRUCTIONS Keep all follow-up appointments. It often takes many weeks to correct anemia, and having your health care provider check on your condition and your response to  treatment is very important. SEEK IMMEDIATE MEDICAL CARE IF:   You develop extreme weakness, shortness of breath, or chest pain.   You become dizzy or have trouble concentrating.  You develop heavy vaginal bleeding.   You develop a rash.   You have bloody or black, tarry stools.   You faint.   You vomit up blood.   You vomit repeatedly.   You have abdominal pain.  You have a fever or persistent symptoms for more than 2-3 days.   You have a fever and your symptoms suddenly get worse.   You are dehydrated.  MAKE SURE YOU:  Understand these instructions.  Will watch your condition.  Will get help right away if you are not doing well or get worse.   This information is not intended to replace advice given to you by your health care provider. Make sure you discuss any questions you have with your health care provider.   Document Released: 12/19/2004 Document Revised: 07/14/2013 Document Reviewed: 05/07/2013 Elsevier Interactive Patient Education [employer].  If any worsening symptoms go to ED immediately I may have you go to ED if blood count is worsening.

## 2016-02-21 ENCOUNTER — Ambulatory Visit (HOSPITAL_BASED_OUTPATIENT_CLINIC_OR_DEPARTMENT_OTHER): Payer: Commercial Managed Care - HMO | Admitting: Hematology

## 2016-02-21 ENCOUNTER — Encounter: Payer: Self-pay | Admitting: Family Medicine

## 2016-02-21 ENCOUNTER — Telehealth: Payer: Self-pay | Admitting: Hematology

## 2016-02-21 ENCOUNTER — Encounter: Payer: Self-pay | Admitting: Hematology

## 2016-02-21 VITALS — BP 113/61 | HR 62 | Temp 98.6°F | Resp 18 | Ht 70.0 in | Wt 191.4 lb

## 2016-02-21 DIAGNOSIS — E611 Iron deficiency: Secondary | ICD-10-CM | POA: Diagnosis not present

## 2016-02-21 DIAGNOSIS — D500 Iron deficiency anemia secondary to blood loss (chronic): Secondary | ICD-10-CM | POA: Insufficient documentation

## 2016-02-21 DIAGNOSIS — Z808 Family history of malignant neoplasm of other organs or systems: Secondary | ICD-10-CM

## 2016-02-21 DIAGNOSIS — Z801 Family history of malignant neoplasm of trachea, bronchus and lung: Secondary | ICD-10-CM

## 2016-02-21 DIAGNOSIS — J449 Chronic obstructive pulmonary disease, unspecified: Secondary | ICD-10-CM

## 2016-02-21 DIAGNOSIS — I2510 Atherosclerotic heart disease of native coronary artery without angina pectoris: Secondary | ICD-10-CM

## 2016-02-21 DIAGNOSIS — D509 Iron deficiency anemia, unspecified: Secondary | ICD-10-CM | POA: Diagnosis not present

## 2016-02-21 DIAGNOSIS — Z87891 Personal history of nicotine dependence: Secondary | ICD-10-CM

## 2016-02-21 DIAGNOSIS — E119 Type 2 diabetes mellitus without complications: Secondary | ICD-10-CM

## 2016-02-21 DIAGNOSIS — D649 Anemia, unspecified: Secondary | ICD-10-CM | POA: Insufficient documentation

## 2016-02-21 DIAGNOSIS — E785 Hyperlipidemia, unspecified: Secondary | ICD-10-CM

## 2016-02-21 LAB — IRON AND TIBC
%SAT: 8 % — AB (ref 15–60)
IRON: 29 ug/dL — AB (ref 50–180)
TIBC: 362 ug/dL (ref 250–425)
UIBC: 333 ug/dL (ref 125–400)

## 2016-02-21 NOTE — Telephone Encounter (Signed)
Gave and printed appt shced avs for pt for March thru May

## 2016-02-21 NOTE — Telephone Encounter (Signed)
Spoke with patient reviewed lab results and referral information. Scheduled patient for nurse visit for BP check.

## 2016-02-21 NOTE — Progress Notes (Signed)
Marland Kitchen    HEMATOLOGY/ONCOLOGY CONSULTATION NOTE  Date of Service: 02/21/2016  Patient Care Team: [name], DO as PCP - General (Family Medicine)  CHIEF COMPLAINTS/PURPOSE OF CONSULTATION:  Severe iron deficiency anemia   HISTORY OF PRESENTING ILLNESS:   [name] is a wonderful 70 y.o. male who has been referred to [place] by Dr .Howard Pouch, DO for evaluation and management of significant iron deficiency anemia.  Patient has a history of hypertension, dyslipidemia, obesity, Chronic GERD with Barrett's esophagus, adenomatous colonic polyps, CKD stage III, coronary disease status post PCI on dual antiplatelet therapy with aspirin and Plavix, sleep apnea not requiring CPAP machine.  He was recently seen by his primary care physician for significant fatigue and was noted to have a hemoglobin of 8.9 with microcytic indices   And significantly low ferritin level of less than 5 with a low iron saturation. His vitamin 123456 and folic acid levels were within normal limits. He notes no overt GI bleeding though he has had some diarrhea and darker stools since his been started on oral ferrous sulfate 3 times a day about 2 weeks ago. He notes significant fatigue and lethargy.  He has been on chronic PPI therapy for his GERD and Barrett's esophagus.  Has had previous history of GI bleeding related to polypectomy in 2011. Notes that his last EGD and colonoscopy was in 2015 with pathology report as noted below showing Barrett's esophagus with no significant dysplasia or malignancy and colonoscopy showing tubular adenomas with no high-grade dysplasia or invasive adenocarcinoma.  Recent fecal other blood testing was negative. [facility] has been [facility] and has been scheduled for a repeat colonoscopy on 02/26/2016.  Patient notes that the oral iron is bothering his stomach and causing him diarrhea. He notes that he is feeling very lethargic and would like to see if there is something  that might help him feel better soon. He notes that he babysits his granddaughter and has difficulties keeping up with her given his low energy levels.  We discussed the option of getting IV iron and also the importance of having a comprehensive GI workup to evaluate source of blood loss or absorption issues. He was also encouraged to talk to his cardiologist about whether he had a significant need to continue dual antiplatelet therapy order of one of the medications could be discontinued to reduce his risk of potential bleeding.   MEDICAL HISTORY:  Past Medical History  Diagnosis Date  . Depression   . GERD (gastroesophageal reflux disease)   . Hypercholesterolemia   . Obesity   . HTN (hypertension)   . Dyspnea   . CAD (coronary artery disease)     ETT/Lexiscan-Myoview (11/14):  Normal; no ischemia, EF 67%  . COPD (chronic obstructive pulmonary disease) ([place]) 2009    spirometry with "moderate COPD"  . Colon polyp   . Myocardial infarction ([place])   . Sleep apnea   . Diabetes mellitus without complication ([place])   . Vertigo   . Adenomatous colon polyp 2011  . CKD (chronic kidney disease), stage III   . GI bleed     after polypectomy/admitted  . Barrett esophagus 12/2013    EGD  MI s/p PCI Last colonoscopy 2015 Small sliding type hiatal hernia. Sleep apnea - uses trazodone. Has never been on a CPAP machine. Welding related pleural scarring.   SURGICAL HISTORY: Past Surgical History  Procedure Laterality Date  . Appendectomy    . Varicocelectomy    . Tonsillectomy    .  Pleural scarification  1987    SOCIAL HISTORY: Social History   Social History  . Marital Status: Married    Spouse Name: N/A  . Number of Children: N/A  . Years of Education: N/A   Occupational History  . machinist    Social History Main Topics  . Smoking status: Former Smoker    Types: Cigarettes    Quit date: 11/25/1998  . Smokeless tobacco: Never Used  . Alcohol Use: No  . Drug Use: No  .  Sexual Activity: Yes   Other Topics Concern  . Not on file   Social History Narrative   Married to [name].   Retired Production designer, theatre/television/film truck.    Drinks caffeine,  Takes a daily vitamin, wears a seatbelt   Wears a bicycle helmet.    Has dentures (partial on top)   Smoke detector in the home. No firearms in the home.    Feels safe in relationships.      FAMILY HISTORY: Family History  Problem Relation Age of Onset  . Coronary artery disease Father 44  . Hypertension    . Hyperlipidemia    . Heart disease Mother   . Heart disease Father   . Stroke Father   . CVA Mother   . Cancer - Other Brother     Renal cell carcinoma  . Colon polyps Sister   sister - lung cancer (smoker. Was about 77 yrs at diagnosis).  ALLERGIES:  is allergic to altace; codeine; tape; and zantac.  MEDICATIONS:  Current Outpatient Prescriptions  Medication Sig Dispense Refill  . aspirin EC 81 MG tablet Take 81 mg by mouth daily.     Marland Kitchen atorvastatin (LIPITOR) 80 MG tablet Take 1 tablet (80 mg total) by mouth daily. 90 tablet 0  . buPROPion (WELLBUTRIN XL) 300 MG 24 hr tablet Take 300 mg by mouth daily.     . clopidogrel (PLAVIX) 75 MG tablet Take 75 mg by mouth daily.    . ferrous sulfate 325 (65 FE) MG tablet Take 1 tablet (325 mg total) by mouth 3 (three) times daily. 90 tablet 3  . metFORMIN (GLUCOPHAGE-XR) 500 MG 24 hr tablet Take 500 mg by mouth 3 (three) times daily.     . metoprolol (LOPRESSOR) 50 MG tablet Take 1 tablet (50 mg total) by mouth daily. 90 tablet 0  . Multiple Vitamin (MULTIVITAMIN) tablet Take 1 tablet by mouth daily.      . nitroGLYCERIN (NITROSTAT) 0.4 MG SL tablet Place 0.4 mg under the tongue every 5 (five) minutes as needed for chest pain (3 doses max).    . pantoprazole (PROTONIX) 40 MG tablet Take 40 mg by mouth daily.      Marland Kitchen senna-docusate (SENOKOT-S) 8.6-50 MG tablet Take 2 tablets by mouth daily. 60 tablet 3  . SPIRIVA HANDIHALER 18 MCG inhalation capsule daily.    . SYMBICORT 160-4.5  MCG/ACT inhaler Inhale 2 puffs into the lungs daily as needed (for wheezing & shortness of breath).     . traZODone (DESYREL) 50 MG tablet TAKE 3 TABLETS AT BEDTIME 90 tablet 0  . valsartan (DIOVAN) 160 MG tablet TAKE 1 TABLET  DAILY. (Patient taking differently: TAKE .5 TABLET  DAILY.) 90 tablet 2  . zoster vaccine live, PF, (ZOSTAVAX) 09811 UNT/0.65ML injection Inject 19,400 Units into the skin once. 1 each 0   No current facility-administered medications for this visit.    REVIEW OF SYSTEMS:    10 Point review of Systems was done is negative  except as noted above.  PHYSICAL EXAMINATION: ECOG PERFORMANCE STATUS: 2 - Symptomatic, <50% confined to bed  . Filed Vitals:   02/21/16 1353  BP: 113/61  Pulse: 62  Temp: 98.6 F (37 C)  Resp: 18   Filed Weights   02/21/16 1353  Weight: 191 lb 6.4 oz (86.818 kg)   .Body mass index is 27.46 kg/(m^2).  Marland Kitchen Wt Readings from Last 3 Encounters:  02/21/16 191 lb 6.4 oz (86.818 kg)  02/20/16 192 lb 8 oz (87.317 kg)  01/31/16 193 lb 12.8 oz (87.907 kg)    GENERAL:alert, in no acute distress and comfortable SKIN: skin color, texture, turgor are normal, no rashes or significant lesions EYES: normal, conjunctiva are pink and non-injected, sclera clear OROPHARYNX:no exudate, no erythema and lips, buccal mucosa, and tongue normal  NECK: supple, no JVD, thyroid normal size, non-tender, without nodularity LYMPH:  no palpable lymphadenopathy in the cervical, axillary or inguinal LUNGS: clear to auscultation with normal respiratory effort HEART: regular rate & rhythm,  no murmurs and no lower extremity edema ABDOMEN: abdomen soft, non-tender, normoactive bowel sounds  Musculoskeletal: no cyanosis of digits and no clubbing  PSYCH: alert & oriented x 3 with fluent speech NEURO: no focal motor/sensory deficits  LABORATORY DATA:  I have reviewed the data as listed  . CBC Latest Ref Rng 02/20/2016 01/15/2016 01/14/2011  WBC 4.0 - 10.5 K/uL 6.6 7.1  6.5  Hemoglobin 13.0 - 17.0 g/dL 9.1(L) 8.9 Repeated and verified X2.(L) 9.7(L)  Hematocrit 39.0 - 52.0 % 29.2(L) 27.8(L) 29.7(L)  Platelets 150 - 400 K/uL 233 249.0 185   . CBC    Component Value Date/Time   WBC 6.6 02/20/2016 1118   RBC 3.93* 02/20/2016 1118   RBC 3.93* 02/20/2016 1118   HGB 9.1* 02/20/2016 1118   HCT 29.2* 02/20/2016 1118   PLT 233 02/20/2016 1118   MCV 74.3* 02/20/2016 1118   MCH 23.2* 02/20/2016 1118   MCHC 31.2 02/20/2016 1118   RDW 18.7* 02/20/2016 1118   LYMPHSABS 1.3 02/20/2016 1118   MONOABS 0.3 02/20/2016 1118   EOSABS 0.2 02/20/2016 1118   BASOSABS 0.1 02/20/2016 1118     . CMP Latest Ref Rng 02/20/2016 01/15/2016 01/14/2011  Glucose 70 - 99 mg/dL 114(H) 176(H) 113(H)  BUN 6 - 23 mg/dL 19 34(H) 10  Creatinine 0.40 - 1.50 mg/dL 1.37 1.56(H) 1.04  Sodium 135 - 145 mEq/L 140 136 139  Potassium 3.5 - 5.1 mEq/L 4.7 4.9 3.7  Chloride 96 - 112 mEq/L 106 106 109  CO2 19 - 32 mEq/L 25 22 24   Calcium 8.4 - 10.5 mg/dL 9.2 9.5 8.4  Total Protein 6.0 - 8.3 g/dL - - -  Total Bilirubin 0.3 - 1.2 mg/dL - - -  Alkaline Phos 39 - 117 U/L - - -  AST 0 - 37 U/L - - -  ALT 0 - 53 U/L - - -   . Lab Results  Component Value Date   IRON 28* 02/20/2016   TIBC 379 02/20/2016   IRONPCTSAT 7* 02/20/2016   (Iron and TIBC)  Lab Results  Component Value Date   FERRITIN 5* 02/20/2016   Component     Latest Ref Rng 02/20/2016 02/20/2016        10:34 AM 11:18 AM  Retic Ct Pct     0.4 - 2.3 %  1.3  RBC.     4.22 - 5.81 MIL/uL  3.93 (L)  ABS Retic     19.0 - 186.0  K/uL  51.1  Ferritin     22.0 - 322.0 ng/mL 3.9 (L)   Vitamin B12     200 - 1100 pg/mL 1170 (H) 1282 (H)  Folate     >5.4 ng/mL >23.3 >24.0      RADIOGRAPHIC STUDIES: I have personally reviewed the radiological images as listed and agreed with the findings in the report. No results found.  ASSESSMENT & PLAN:   70 year old Caucasian male with  #1 symptomatic significant microcytic anemia  due to severe iron deficiency. Cannot rule out an element of anemia due to chronic kidney disease. #2 iron deficiency -unclear etiology possible GI chronic blood loss versus poor absorption due to chronic PPI therapy. #3 coronary artery disease status post PCI on dual antiplatelet therapy with aspirin and Plavix. #4 COPD #5 dyslipidemia #6 diabetes on metformin #7 GI intolerance to oral iron with significant diarrhea and some nausea. #8 history of recurrent adenomatous colonic polyps .   plan  -I discussed the pros and cons of replacing the patient's severe iron deficiency with IV iron. Patient is agreeable to this and has been ordered to get IV Feraheme 510 mg every weekly 2 doses. -He might all his oral iron at this time. We shall restart him on some low-dose oral maintenance as needed after reassessing his iron stores. -Continue multivitamin. -Discussed with cardiologist the true need for ongoing dual antiplatelet therapy. -Follow-up with gastroenterology for colonoscopy plus or minus endoscopy to workup for iron loss/GI bleeding. He has been scheduled for a colonoscopy on 02/26/2016. -Continue follow-up with primary care physician for management of other chronic medical issues.  Return to care with Dr. Irene Limbo in 4-6 weeks with repeat labs to reassess iron requirements and determine need for maintenance oral iron.  All of the patients questions were answered  to his apparent satisfaction. The patient knows to call the clinic with any problems, questions or concerns.  I spent 60 minutes counseling the patient face to face. The total time spent in the appointment was 60 minutes and more than 50% was on counseling and direct patient cares.    Sullivan Lone MD [place] AAHIVMS [facility] Hematology/Oncology Physician [facility]  (Office):       [phone] (Work cell):  [phone] (Fax):           [phone]  02/21/2016 2:07 PM

## 2016-02-22 LAB — PATHOLOGIST SMEAR REVIEW

## 2016-02-23 ENCOUNTER — Ambulatory Visit (HOSPITAL_BASED_OUTPATIENT_CLINIC_OR_DEPARTMENT_OTHER): Payer: Commercial Managed Care - HMO

## 2016-02-23 ENCOUNTER — Telehealth: Payer: Self-pay | Admitting: Hematology

## 2016-02-23 VITALS — BP 112/62 | HR 66 | Temp 98.6°F | Resp 17

## 2016-02-23 DIAGNOSIS — D649 Anemia, unspecified: Secondary | ICD-10-CM

## 2016-02-23 DIAGNOSIS — D500 Iron deficiency anemia secondary to blood loss (chronic): Secondary | ICD-10-CM | POA: Diagnosis not present

## 2016-02-23 MED ORDER — SODIUM CHLORIDE 0.9 % IV SOLN
Freq: Once | INTRAVENOUS | Status: AC
  Administered 2016-02-23: 09:00:00 via INTRAVENOUS

## 2016-02-23 MED ORDER — SODIUM CHLORIDE 0.9 % IV SOLN
510.0000 mg | Freq: Once | INTRAVENOUS | Status: AC
  Administered 2016-02-23: 510 mg via INTRAVENOUS
  Filled 2016-02-23: qty 17

## 2016-02-23 NOTE — Patient Instructions (Signed)
Iron Deficiency Anemia, Adult Anemia is a condition in which there are less red blood cells or hemoglobin in the blood than normal. Hemoglobin is the part of red blood cells that carries oxygen. Iron deficiency anemia is anemia caused by too little iron. It is the most common type of anemia. It may leave you tired and short of breath. CAUSES   Lack of iron in the diet.  Poor absorption of iron, as seen with intestinal disorders.  Intestinal bleeding.  Heavy periods. SIGNS AND SYMPTOMS  Mild anemia may not be noticeable. Symptoms may include:  Fatigue.  Headache.  Pale skin.  Weakness.  Tiredness.  Shortness of breath.  Dizziness.  Cold hands and feet.  Fast or irregular heartbeat. DIAGNOSIS  Diagnosis requires a thorough evaluation and physical exam by your health care provider. Blood tests are generally used to confirm iron deficiency anemia. Additional tests may be done to find the underlying cause of your anemia. These may include:  Testing for blood in the stool (fecal occult blood test).  A procedure to see inside the colon and rectum (colonoscopy).  A procedure to see inside the esophagus and stomach (endoscopy). TREATMENT  Iron deficiency anemia is treated by correcting the cause of the deficiency. Treatment may involve:  Adding iron-rich foods to your diet.  Taking iron supplements. Pregnant or breastfeeding women need to take extra iron because their normal diet usually does not provide the required amount.  Taking vitamins. Vitamin C improves the absorption of iron. Your health care provider may recommend that you take your iron tablets with a glass of orange juice or vitamin C supplement.  Medicines to make heavy menstrual flow lighter.  Surgery. HOME CARE INSTRUCTIONS   Take iron as directed by your health care provider.  If you cannot tolerate taking iron supplements by mouth, talk to your health care provider about taking them through a vein  (intravenously) or an injection into a muscle.  For the best iron absorption, iron supplements should be taken on an empty stomach. If you cannot tolerate them on an empty stomach, you may need to take them with food.  Do not drink milk or take antacids at the same time as your iron supplements. Milk and antacids may interfere with the absorption of iron.  Iron supplements can cause constipation. Make sure to include fiber in your diet to prevent constipation. A stool softener may also be recommended.  Take vitamins as directed by your health care provider.  Eat a diet rich in iron. Foods high in iron include liver, lean beef, whole-grain bread, eggs, dried fruit, and dark green leafy vegetables. SEEK IMMEDIATE MEDICAL CARE IF:   You faint. If this happens, do not drive. Call your local emergency services (911 in [place] if no other help is available.  You have chest pain.  You feel nauseous or vomit.  You have severe or increased shortness of breath with activity.  You feel weak.  You have a rapid heartbeat.  You have unexplained sweating.  You become light-headed when getting up from a chair or bed. MAKE SURE YOU:   Understand these instructions.  Will watch your condition.  Will get help right away if you are not doing well or get worse.   This information is not intended to replace advice given to you by your health care provider. Make sure you discuss any questions you have with your health care provider.   Document Released: 11/08/2000 Document Revised: 12/02/2014 Document Reviewed: 07/19/2013 Elsevier   Interactive Patient Education 2016 [place].   Ferumoxytol injection What is this medicine? FERUMOXYTOL is an iron complex. Iron is used to make healthy red blood cells, which carry oxygen and nutrients throughout the body. This medicine is used to treat iron deficiency anemia in people with chronic kidney disease. This medicine may be used for other purposes; ask  your health care provider or pharmacist if you have questions. What should I tell my health care provider before I take this medicine? They need to know if you have any of these conditions: -anemia not caused by low iron levels -high levels of iron in the blood -magnetic resonance imaging (MRI) test scheduled -an unusual or allergic reaction to iron, other medicines, foods, dyes, or preservatives -pregnant or trying to get pregnant -breast-feeding How should I use this medicine? This medicine is for injection into a vein. It is given by a health care professional in a hospital or clinic setting. Talk to your pediatrician regarding the use of this medicine in children. Special care may be needed. Overdosage: If you think you have taken too much of this medicine contact a poison control center or emergency room at once. NOTE: This medicine is only for you. Do not share this medicine with others. What if I miss a dose? It is important not to miss your dose. Call your doctor or health care professional if you are unable to keep an appointment. What may interact with this medicine? This medicine may interact with the following medications: -other iron products This list may not describe all possible interactions. Give your health care provider a list of all the medicines, herbs, non-prescription drugs, or dietary supplements you use. Also tell them if you smoke, drink alcohol, or use illegal drugs. Some items may interact with your medicine. What should I watch for while using this medicine? Visit your doctor or healthcare professional regularly. Tell your doctor or healthcare professional if your symptoms do not start to get better or if they get worse. You may need blood work done while you are taking this medicine. You may need to follow a special diet. Talk to your doctor. Foods that contain iron include: whole grains/cereals, dried fruits, beans, or peas, leafy green vegetables, and organ meats  (liver, kidney). What side effects may I notice from receiving this medicine? Side effects that you should report to your doctor or health care professional as soon as possible: -allergic reactions like skin rash, itching or hives, swelling of the face, lips, or tongue -breathing problems -changes in blood pressure -feeling faint or lightheaded, falls -fever or chills -flushing, sweating, or hot feelings -swelling of the ankles or feet Side effects that usually do not require medical attention (Report these to your doctor or health care professional if they continue or are bothersome.): -diarrhea -headache -nausea, vomiting -stomach pain This list may not describe all possible side effects. Call your doctor for medical advice about side effects. You may report side effects to FDA at 1-800-FDA-1088. Where should I keep my medicine? This drug is given in a hospital or clinic and will not be stored at home. NOTE: This sheet is a summary. It may not cover all possible information. If you have questions about this medicine, talk to your doctor, pharmacist, or health care provider.    2016, Elsevier/Gold Standard. (2012-06-26 15:23:36)

## 2016-02-23 NOTE — Telephone Encounter (Signed)
pt called to r/s appt...done....pt ok and aware of new d.t °

## 2016-02-26 DIAGNOSIS — D509 Iron deficiency anemia, unspecified: Secondary | ICD-10-CM | POA: Diagnosis not present

## 2016-02-26 DIAGNOSIS — Z86010 Personal history of colonic polyps: Secondary | ICD-10-CM | POA: Diagnosis not present

## 2016-02-26 DIAGNOSIS — K2270 Barrett's esophagus without dysplasia: Secondary | ICD-10-CM | POA: Diagnosis not present

## 2016-02-28 ENCOUNTER — Ambulatory Visit (INDEPENDENT_AMBULATORY_CARE_PROVIDER_SITE_OTHER): Payer: Commercial Managed Care - HMO | Admitting: *Deleted

## 2016-02-28 ENCOUNTER — Telehealth: Payer: Self-pay

## 2016-02-28 VITALS — BP 100/67 | HR 68 | Resp 20

## 2016-02-28 DIAGNOSIS — I10 Essential (primary) hypertension: Secondary | ICD-10-CM | POA: Diagnosis not present

## 2016-02-28 NOTE — Progress Notes (Signed)
Patient presents today for BP check. Results documented. Dr Raoul Pitch aware.

## 2016-02-28 NOTE — Telephone Encounter (Signed)
Ok to stop plavix 5 days before colonoscopy Restart right away if no biopsy or intervention Restart 5 days if polpectomy or biopsy

## 2016-02-28 NOTE — Telephone Encounter (Signed)
The patient needs to have a colonoscopy on 03/28/16 per Dr. Michail Sermon with [facility]. Reason for procedure is Anemia. They are requesting that the patient temporarily stop taking Plavix before his procedure, or after the procedure if polyps are removed.  Also, they need to know when he should resume Plavix (if polyps removed) post colonoscopy. Please Advise.  Will fax back to [facility] at [phone].

## 2016-02-29 NOTE — Progress Notes (Signed)
Patient presented on 02/28/2016 for  BP check per Dr Raoul Pitch. BP within normal range. Dr Raoul Pitch aware. No changes to current plan of care.

## 2016-03-01 ENCOUNTER — Ambulatory Visit (HOSPITAL_BASED_OUTPATIENT_CLINIC_OR_DEPARTMENT_OTHER): Payer: Commercial Managed Care - HMO

## 2016-03-01 VITALS — BP 132/70 | HR 63 | Temp 98.6°F

## 2016-03-01 DIAGNOSIS — D649 Anemia, unspecified: Secondary | ICD-10-CM

## 2016-03-01 DIAGNOSIS — D500 Iron deficiency anemia secondary to blood loss (chronic): Secondary | ICD-10-CM

## 2016-03-01 MED ORDER — FERUMOXYTOL INJECTION 510 MG/17 ML
510.0000 mg | Freq: Once | INTRAVENOUS | Status: AC
  Administered 2016-03-01: 510 mg via INTRAVENOUS
  Filled 2016-03-01: qty 17

## 2016-03-01 MED ORDER — SODIUM CHLORIDE 0.9 % IV SOLN
Freq: Once | INTRAVENOUS | Status: AC
  Administered 2016-03-01: 10:00:00 via INTRAVENOUS

## 2016-03-01 NOTE — Telephone Encounter (Signed)
Sent fax to Dr. Kathline Magic office.

## 2016-03-01 NOTE — Patient Instructions (Signed)

## 2016-03-04 DIAGNOSIS — H5200 Hypermetropia, unspecified eye: Secondary | ICD-10-CM | POA: Diagnosis not present

## 2016-03-04 DIAGNOSIS — H5210 Myopia, unspecified eye: Secondary | ICD-10-CM | POA: Diagnosis not present

## 2016-03-04 DIAGNOSIS — E119 Type 2 diabetes mellitus without complications: Secondary | ICD-10-CM | POA: Diagnosis not present

## 2016-03-04 DIAGNOSIS — I10 Essential (primary) hypertension: Secondary | ICD-10-CM | POA: Diagnosis not present

## 2016-03-04 LAB — HM DIABETES EYE EXAM

## 2016-03-04 NOTE — Addendum Note (Signed)
Addended by: Howard Pouch A on: 03/04/2016 07:15 PM   Modules accepted: Level of Service

## 2016-03-04 NOTE — Progress Notes (Signed)
Patient ID: [name], male   DOB: 05-27-1946, 70 y.o.   MRN: [id number] Blood pressure noted. Patient aware of changes to medications. No current changes today.

## 2016-03-15 ENCOUNTER — Other Ambulatory Visit: Payer: Self-pay | Admitting: *Deleted

## 2016-03-15 MED ORDER — ATORVASTATIN CALCIUM 80 MG PO TABS: 80.0000 mg | ORAL_TABLET | Freq: Every day | ORAL | Status: DC

## 2016-03-15 MED ORDER — METOPROLOL TARTRATE 50 MG PO TABS: 50.0000 mg | ORAL_TABLET | Freq: Every day | ORAL | Status: DC

## 2016-03-15 MED ORDER — SYMBICORT 160-4.5 MCG/ACT IN AERO: 2.0000 | INHALATION_SPRAY | Freq: Every day | RESPIRATORY_TRACT | Status: DC | PRN

## 2016-03-15 MED ORDER — PANTOPRAZOLE SODIUM 40 MG PO TBEC: 40.0000 mg | DELAYED_RELEASE_TABLET | Freq: Every day | ORAL | Status: DC

## 2016-03-15 NOTE — Telephone Encounter (Signed)
Medication refills sent to [facility] as requested

## 2016-03-18 ENCOUNTER — Other Ambulatory Visit: Payer: Self-pay | Admitting: *Deleted

## 2016-03-18 MED ORDER — SYMBICORT 160-4.5 MCG/ACT IN AERO: 2.0000 | INHALATION_SPRAY | Freq: Every day | RESPIRATORY_TRACT | Status: DC

## 2016-03-19 ENCOUNTER — Other Ambulatory Visit: Payer: Self-pay | Admitting: *Deleted

## 2016-03-19 MED ORDER — SYMBICORT 160-4.5 MCG/ACT IN AERO: 2.0000 | INHALATION_SPRAY | Freq: Two times a day (BID) | RESPIRATORY_TRACT | Status: DC

## 2016-03-20 ENCOUNTER — Encounter: Payer: Self-pay | Admitting: Family Medicine

## 2016-03-20 ENCOUNTER — Other Ambulatory Visit: Payer: Self-pay | Admitting: Family Medicine

## 2016-03-29 ENCOUNTER — Ambulatory Visit: Payer: Commercial Managed Care - HMO | Admitting: Hematology

## 2016-03-29 ENCOUNTER — Other Ambulatory Visit: Payer: Commercial Managed Care - HMO

## 2016-04-01 ENCOUNTER — Other Ambulatory Visit: Payer: Self-pay | Admitting: Family Medicine

## 2016-04-02 ENCOUNTER — Encounter (HOSPITAL_COMMUNITY): Payer: Self-pay

## 2016-04-02 ENCOUNTER — Emergency Department (HOSPITAL_COMMUNITY)
Admission: EM | Admit: 2016-04-02 | Discharge: 2016-04-03 | Disposition: A | Discharge status: Home / Self Care | Payer: Commercial Managed Care - HMO | Attending: Emergency Medicine | Admitting: Emergency Medicine

## 2016-04-02 ENCOUNTER — Other Ambulatory Visit: Payer: Self-pay | Admitting: Gastroenterology

## 2016-04-02 DIAGNOSIS — Z8669 Personal history of other diseases of the nervous system and sense organs: Secondary | ICD-10-CM | POA: Diagnosis not present

## 2016-04-02 DIAGNOSIS — J449 Chronic obstructive pulmonary disease, unspecified: Secondary | ICD-10-CM | POA: Insufficient documentation

## 2016-04-02 DIAGNOSIS — I129 Hypertensive chronic kidney disease with stage 1 through stage 4 chronic kidney disease, or unspecified chronic kidney disease: Secondary | ICD-10-CM | POA: Insufficient documentation

## 2016-04-02 DIAGNOSIS — Z79899 Other long term (current) drug therapy: Secondary | ICD-10-CM | POA: Diagnosis not present

## 2016-04-02 DIAGNOSIS — Z86010 Personal history of colonic polyps: Secondary | ICD-10-CM | POA: Insufficient documentation

## 2016-04-02 DIAGNOSIS — R339 Retention of urine, unspecified: Secondary | ICD-10-CM | POA: Diagnosis not present

## 2016-04-02 DIAGNOSIS — E119 Type 2 diabetes mellitus without complications: Secondary | ICD-10-CM | POA: Insufficient documentation

## 2016-04-02 DIAGNOSIS — F329 Major depressive disorder, single episode, unspecified: Secondary | ICD-10-CM | POA: Insufficient documentation

## 2016-04-02 DIAGNOSIS — K2960 Other gastritis without bleeding: Secondary | ICD-10-CM | POA: Diagnosis not present

## 2016-04-02 DIAGNOSIS — I252 Old myocardial infarction: Secondary | ICD-10-CM | POA: Insufficient documentation

## 2016-04-02 DIAGNOSIS — I2510 Atherosclerotic heart disease of native coronary artery without angina pectoris: Secondary | ICD-10-CM | POA: Diagnosis not present

## 2016-04-02 DIAGNOSIS — K219 Gastro-esophageal reflux disease without esophagitis: Secondary | ICD-10-CM | POA: Diagnosis not present

## 2016-04-02 DIAGNOSIS — E7800 Pure hypercholesterolemia, unspecified: Secondary | ICD-10-CM | POA: Insufficient documentation

## 2016-04-02 DIAGNOSIS — Z87891 Personal history of nicotine dependence: Secondary | ICD-10-CM | POA: Diagnosis not present

## 2016-04-02 DIAGNOSIS — E669 Obesity, unspecified: Secondary | ICD-10-CM | POA: Diagnosis not present

## 2016-04-02 DIAGNOSIS — K3189 Other diseases of stomach and duodenum: Secondary | ICD-10-CM | POA: Diagnosis not present

## 2016-04-02 DIAGNOSIS — N183 Chronic kidney disease, stage 3 (moderate): Secondary | ICD-10-CM | POA: Diagnosis not present

## 2016-04-02 DIAGNOSIS — K259 Gastric ulcer, unspecified as acute or chronic, without hemorrhage or perforation: Secondary | ICD-10-CM | POA: Diagnosis not present

## 2016-04-02 DIAGNOSIS — R34 Anuria and oliguria: Secondary | ICD-10-CM | POA: Diagnosis not present

## 2016-04-02 DIAGNOSIS — K640 First degree hemorrhoids: Secondary | ICD-10-CM | POA: Diagnosis not present

## 2016-04-02 DIAGNOSIS — K449 Diaphragmatic hernia without obstruction or gangrene: Secondary | ICD-10-CM | POA: Diagnosis not present

## 2016-04-02 DIAGNOSIS — R1030 Lower abdominal pain, unspecified: Secondary | ICD-10-CM | POA: Insufficient documentation

## 2016-04-02 DIAGNOSIS — Z7902 Long term (current) use of antithrombotics/antiplatelets: Secondary | ICD-10-CM | POA: Insufficient documentation

## 2016-04-02 DIAGNOSIS — K2270 Barrett's esophagus without dysplasia: Secondary | ICD-10-CM | POA: Diagnosis not present

## 2016-04-02 DIAGNOSIS — Z7984 Long term (current) use of oral hypoglycemic drugs: Secondary | ICD-10-CM | POA: Diagnosis not present

## 2016-04-02 DIAGNOSIS — D509 Iron deficiency anemia, unspecified: Secondary | ICD-10-CM | POA: Diagnosis not present

## 2016-04-02 DIAGNOSIS — Z7982 Long term (current) use of aspirin: Secondary | ICD-10-CM | POA: Diagnosis not present

## 2016-04-02 NOTE — ED Notes (Signed)
Pt here with c/o urinary retention, last void yesterday. His abdomen does not appear to be distended at this time but he reports abdominal pain.

## 2016-04-03 NOTE — ED Provider Notes (Signed)
CSN: [id number]     Arrival date & time 04/02/16  2320 History   First MD Initiated Contact with Patient 04/03/16 0024     Chief Complaint  Patient presents with  . Urinary Retention    HPI   [name] is a 70 y.o. male with a PMH of HLD, HTN, DM, CAD, MI, COPD, CKD who presents to the ED with urinary retention. He states he had an endoscopy and colonoscopy earlier this morning, and was advised to drink plenty of fluids immediately after. He notes he has not been able to urinate today, prompting him to come to the ED. He denies exacerbating or alleviating factors. He notes associated mild abdominal discomfort. He denies nausea, vomiting, diarrhea, constipation.   Past Medical History  Diagnosis Date  . Depression   . GERD (gastroesophageal reflux disease)   . Hypercholesterolemia   . Obesity   . HTN (hypertension)   . Dyspnea   . CAD (coronary artery disease)     ETT/Lexiscan-Myoview (11/14):  Normal; no ischemia, EF 67%  . COPD (chronic obstructive pulmonary disease) ([place]) 2009    spirometry with "moderate COPD"  . Colon polyp   . Myocardial infarction ([place])   . Sleep apnea   . Diabetes mellitus without complication ([place])   . Vertigo   . Adenomatous colon polyp 2011  . CKD (chronic kidney disease), stage III   . GI bleed     after polypectomy/admitted  . Barrett esophagus 12/2013    EGD   Past Surgical History  Procedure Laterality Date  . Appendectomy    . Varicocelectomy    . Tonsillectomy    . Pleural scarification  1987   Family History  Problem Relation Age of Onset  . Coronary artery disease Father 46  . Hypertension    . Hyperlipidemia    . Heart disease Mother   . Heart disease Father   . Stroke Father   . CVA Mother   . Cancer - Other Brother     Renal cell carcinoma  . Colon polyps Sister    Social History  Substance Use Topics  . Smoking status: Former Smoker    Types: Cigarettes    Quit date: 11/25/1998  . Smokeless tobacco: Never Used  .  Alcohol Use: No     Review of Systems  Constitutional: Negative for fever and chills.  Gastrointestinal: Positive for abdominal pain. Negative for nausea, vomiting, diarrhea and constipation.  Genitourinary: Positive for decreased urine volume. Negative for dysuria, urgency and frequency.  All other systems reviewed and are negative.     Allergies  Altace; Codeine; Tape; and Zantac  Home Medications   Prior to Admission medications   Medication Sig Start Date End Date Taking? Authorizing Provider  aspirin EC 81 MG tablet Take 81 mg by mouth daily.     Historical Provider, MD  atorvastatin (LIPITOR) 80 MG tablet Take 1 tablet (80 mg total) by mouth daily. 03/15/16   Renee A Kuneff, DO  buPROPion (WELLBUTRIN XL) 300 MG 24 hr tablet Take 300 mg by mouth daily.     Historical Provider, MD  clopidogrel (PLAVIX) 75 MG tablet Take 75 mg by mouth daily.    Historical Provider, MD  ferrous sulfate 325 (65 FE) MG tablet TAKE 1 TABLET THREE TIMES DAILY 04/01/16   Renee A Kuneff, DO  metFORMIN (GLUCOPHAGE-XR) 500 MG 24 hr tablet Take 500 mg by mouth 3 (three) times daily.  11/03/11   Historical Provider, MD  metoprolol (LOPRESSOR) 50 MG tablet Take 1 tablet (50 mg total) by mouth daily. 03/15/16   Renee A Kuneff, DO  Multiple Vitamin (MULTIVITAMIN) tablet Take 1 tablet by mouth daily.      Historical Provider, MD  nitroGLYCERIN (NITROSTAT) 0.4 MG SL tablet Place 0.4 mg under the tongue every 5 (five) minutes as needed for chest pain (3 doses max).    Historical Provider, MD  pantoprazole (PROTONIX) 40 MG tablet Take 1 tablet (40 mg total) by mouth daily. 03/15/16   Renee A Kuneff, DO  senna-docusate (SENOKOT-S) 8.6-50 MG tablet Take 2 tablets by mouth daily. 01/31/16   Renee A Kuneff, DO  SPIRIVA HANDIHALER 18 MCG inhalation capsule daily. 01/17/16   Historical Provider, MD  SYMBICORT 160-4.5 MCG/ACT inhaler Inhale 2 puffs into the lungs 2 (two) times daily. 03/19/16   Renee A Kuneff, DO  traZODone  (DESYREL) 50 MG tablet TAKE 3 TABLETS AT BEDTIME 03/20/16   Renee A Kuneff, DO  valsartan (DIOVAN) 160 MG tablet TAKE 1 TABLET  DAILY. Patient taking differently: TAKE .5 TABLET  DAILY. 12/21/15   Josue Hector, MD  zoster vaccine live, PF, (ZOSTAVAX) 16109 UNT/0.65ML injection Inject 19,400 Units into the skin once. 02/20/16   Renee A Kuneff, DO    BP 117/67 mmHg  Pulse 68  Temp(Src) 97.8 F (36.6 C) (Oral)  Resp 16  SpO2 97% Physical Exam  Constitutional: He is oriented to person, place, and time. He appears well-developed and well-nourished. No distress.  HENT:  Head: Normocephalic and atraumatic.  Right Ear: External ear normal.  Left Ear: External ear normal.  Nose: Nose normal.  Mouth/Throat: Uvula is midline, oropharynx is clear and moist and mucous membranes are normal.  Eyes: Conjunctivae, EOM and lids are normal. Pupils are equal, round, and reactive to light. Right eye exhibits no discharge. Left eye exhibits no discharge. No scleral icterus.  Neck: Normal range of motion. Neck supple.  Cardiovascular: Normal rate, regular rhythm, normal heart sounds, intact distal pulses and normal pulses.   Pulmonary/Chest: Effort normal and breath sounds normal. No respiratory distress. He has no wheezes. He has no rales.  Abdominal: Soft. Normal appearance and bowel sounds are normal. He exhibits no distension and no mass. There is tenderness. There is no rigidity, no rebound and no guarding.  Mild TTP to suprapubic region.  Musculoskeletal: Normal range of motion. He exhibits no edema or tenderness.  Neurological: He is alert and oriented to person, place, and time.  Skin: Skin is warm, dry and intact. No rash noted. He is not diaphoretic. No erythema. No pallor.  Psychiatric: He has a normal mood and affect. His speech is normal and behavior is normal.  Nursing note and vitals reviewed.   ED Course  Procedures (including critical care time)  Labs Review Labs Reviewed - No data to  display  Imaging Review No results found.   EKG Interpretation None      MDM   Final diagnoses:  Urinary retention    69 year old male presents with urinary retention s/p undergoing endoscopy and colonoscopy this morning. Denies history of similar symptoms. Bladder scan with >700 cc. Foley placed. Patient is afebrile. Vital signs stable. Abdomen soft, non-distended, with mild TTP in suprapubic region. No rebound, guarding, or masses. Patient discussed with Dr. Claudine Mouton. Symptoms likely due to effects of anesthesia that was used during his procedure earlier today. Patient is non-toxic and well-appearing, feel he is stable for discharge at this time. Patient to follow-up  with urology in 48 hours for removal of foley. Strict return precautions discussed. Patient verbalizes his understanding and is in agreement with plan.  BP 117/67 mmHg  Pulse 68  Temp(Src) 97.8 F (36.6 C) (Oral)  Resp 16  SpO2 97%      Marella Chimes, PA-C 04/03/16 0602  Everlene Balls, MD 04/03/16 [phone]

## 2016-04-03 NOTE — ED Notes (Signed)
Pt verbalized understanding of foley catheter use and how to drain leg bag, and also to follow up with urologist in 2 days to have foley catheter removed. Pt stable and NAD.

## 2016-04-03 NOTE — Discharge Instructions (Signed)
1. Medications: usual home medications 2. Treatment: rest, drink plenty of fluids 3. Follow Up: please followup with urology in 48 hours to have your foley catheter removed and for discussion of your diagnoses and further evaluation after today's visit; if you do not have a primary care doctor use the phone number listed in your discharge paperwork to find one; please return to the ER for new or worsening symptoms   Acute Urinary Retention, Male Acute urinary retention is when you are unable to pee (urinate). Acute urinary retention is common in older men. Prostates can get bigger, which blocks the flow of pee.  HOME CARE  Drink enough fluids to keep your pee clear or pale yellow.  If you are sent home with a tube that drains the bladder (catheter), there will be a drainage bag attached to it. There are two types of bags. One is big that you can wear at night without having to empty it. One is smaller and needs to be emptied more often.  Keep the drainage bag empty.  Keep the drainage bag lower than your catheter.  Only take medicine as told by your doctor. GET HELP IF:  You have a low-grade fever.  You have spasms or you are leaking pee when you have spasms. GET HELP RIGHT AWAY IF:   You have chills or a fever.  Your catheter stops draining pee.  Your catheter falls out.  You have increased bleeding that does not stop after you have rested and increased the amount of fluids you had been drinking. MAKE SURE YOU:   Understand these instructions.  Will watch your condition.  Will get help right away if you are not doing well or get worse.   This information is not intended to replace advice given to you by your health care provider. Make sure you discuss any questions you have with your health care provider.   Document Released: 04/29/2008 Document Revised: 03/28/2015 Document Reviewed: 04/22/2013 Elsevier Interactive Patient Education [employer].

## 2016-04-04 ENCOUNTER — Other Ambulatory Visit: Payer: Self-pay | Admitting: *Deleted

## 2016-04-04 DIAGNOSIS — N138 Other obstructive and reflux uropathy: Secondary | ICD-10-CM | POA: Diagnosis not present

## 2016-04-04 DIAGNOSIS — R338 Other retention of urine: Secondary | ICD-10-CM | POA: Diagnosis not present

## 2016-04-04 DIAGNOSIS — N401 Enlarged prostate with lower urinary tract symptoms: Secondary | ICD-10-CM | POA: Diagnosis not present

## 2016-04-04 DIAGNOSIS — D500 Iron deficiency anemia secondary to blood loss (chronic): Secondary | ICD-10-CM

## 2016-04-05 ENCOUNTER — Telehealth: Payer: Self-pay | Admitting: Hematology

## 2016-04-05 ENCOUNTER — Other Ambulatory Visit (HOSPITAL_BASED_OUTPATIENT_CLINIC_OR_DEPARTMENT_OTHER): Payer: Commercial Managed Care - HMO

## 2016-04-05 ENCOUNTER — Encounter: Payer: Self-pay | Admitting: Hematology

## 2016-04-05 ENCOUNTER — Ambulatory Visit (HOSPITAL_BASED_OUTPATIENT_CLINIC_OR_DEPARTMENT_OTHER): Payer: Commercial Managed Care - HMO | Admitting: Hematology

## 2016-04-05 VITALS — BP 109/65 | HR 64 | Temp 98.4°F | Resp 18 | Ht 70.0 in | Wt 178.4 lb

## 2016-04-05 DIAGNOSIS — D509 Iron deficiency anemia, unspecified: Secondary | ICD-10-CM | POA: Diagnosis not present

## 2016-04-05 DIAGNOSIS — E785 Hyperlipidemia, unspecified: Secondary | ICD-10-CM | POA: Diagnosis not present

## 2016-04-05 DIAGNOSIS — D500 Iron deficiency anemia secondary to blood loss (chronic): Secondary | ICD-10-CM

## 2016-04-05 DIAGNOSIS — D649 Anemia, unspecified: Secondary | ICD-10-CM

## 2016-04-05 DIAGNOSIS — E119 Type 2 diabetes mellitus without complications: Secondary | ICD-10-CM

## 2016-04-05 DIAGNOSIS — E611 Iron deficiency: Secondary | ICD-10-CM

## 2016-04-05 DIAGNOSIS — J449 Chronic obstructive pulmonary disease, unspecified: Secondary | ICD-10-CM

## 2016-04-05 LAB — IRON AND TIBC
%SAT: 17 % — ABNORMAL LOW (ref 20–55)
Iron: 38 ug/dL — ABNORMAL LOW (ref 42–163)
TIBC: 220 ug/dL (ref 202–409)
UIBC: 182 ug/dL (ref 117–376)

## 2016-04-05 LAB — COMPREHENSIVE METABOLIC PANEL
ALBUMIN: 3.7 g/dL (ref 3.5–5.0)
ALK PHOS: 63 U/L (ref 40–150)
ALT: 59 U/L — AB (ref 0–55)
AST: 32 U/L (ref 5–34)
Anion Gap: 9 mEq/L (ref 3–11)
BILIRUBIN TOTAL: 0.54 mg/dL (ref 0.20–1.20)
BUN: 12.1 mg/dL (ref 7.0–26.0)
CO2: 25 mEq/L (ref 22–29)
CREATININE: 1.5 mg/dL — AB (ref 0.7–1.3)
Calcium: 9.8 mg/dL (ref 8.4–10.4)
Chloride: 108 mEq/L (ref 98–109)
EGFR: 46 mL/min/{1.73_m2} — ABNORMAL LOW (ref >90)
GLUCOSE: 136 mg/dL (ref 70–140)
Potassium: 4.1 mEq/L (ref 3.5–5.1)
Sodium: 142 mEq/L (ref 136–145)
TOTAL PROTEIN: 6.4 g/dL (ref 6.4–8.3)

## 2016-04-05 LAB — CBC WITH DIFFERENTIAL/PLATELET
BASO%: 0.5 % (ref 0.0–2.0)
Basophils Absolute: 0 10*3/uL (ref 0.0–0.1)
EOS ABS: 0.2 10*3/uL (ref 0.0–0.5)
EOS%: 2.9 % (ref 0.0–7.0)
HCT: 33.7 % — ABNORMAL LOW (ref 38.4–49.9)
HEMOGLOBIN: 11.1 g/dL — AB (ref 13.0–17.1)
LYMPH%: 19.7 % (ref 14.0–49.0)
MCH: 26.5 pg — ABNORMAL LOW (ref 27.2–33.4)
MCHC: 33 g/dL (ref 32.0–36.0)
MCV: 80.3 fL (ref 79.3–98.0)
MONO#: 0.4 10*3/uL (ref 0.1–0.9)
MONO%: 6 % (ref 0.0–14.0)
NEUT%: 70.9 % (ref 39.0–75.0)
NEUTROS ABS: 4.8 10*3/uL (ref 1.5–6.5)
Platelets: 184 10*3/uL (ref 140–400)
RBC: 4.2 10*6/uL (ref 4.20–5.82)
RDW: 21.2 % — AB (ref 11.0–14.6)
WBC: 6.7 10*3/uL (ref 4.0–10.3)
lymph#: 1.3 10*3/uL (ref 0.9–3.3)

## 2016-04-05 LAB — FERRITIN: Ferritin: 107 ng/ml (ref 22–316)

## 2016-04-05 MED ORDER — POLYSACCHARIDE IRON COMPLEX 150 MG PO CAPS: 150.0000 mg | ORAL_CAPSULE | Freq: Every day | ORAL | Status: DC

## 2016-04-05 NOTE — Telephone Encounter (Signed)
Gave and printed appt for Aug

## 2016-04-07 NOTE — Progress Notes (Signed)
[facility]    HEMATOLOGY/ONCOLOGY CONSULTATION NOTE  Encounter Date.04/05/2016  Patient Care Team: [name], DO as PCP - General (Family Medicine)  CHIEF COMPLAINTS/PURPOSE OF CONSULTATION:  Severe iron deficiency anemia   HISTORY OF PRESENTING ILLNESS:   [name] is a wonderful 70 y.o. male who has been referred to [place] by Dr .Howard Pouch, DO for evaluation and management of significant iron deficiency anemia.  Patient has a history of hypertension, dyslipidemia, obesity, Chronic GERD with Barrett's esophagus, adenomatous colonic polyps, CKD stage III, coronary disease status post PCI on dual antiplatelet therapy with aspirin and Plavix, sleep apnea not requiring CPAP machine.  He was recently seen by his primary care physician for significant fatigue and was noted to have a hemoglobin of 8.9 with microcytic indices   And significantly low ferritin level of less than 5 with a low iron saturation. His vitamin 123456 and folic acid levels were within normal limits. He notes no overt GI bleeding though he has had some diarrhea and darker stools since his been started on oral ferrous sulfate 3 times a day about 2 weeks ago. He notes significant fatigue and lethargy.  He has been on chronic PPI therapy for his GERD and Barrett's esophagus.  Has had previous history of GI bleeding related to polypectomy in 2011. Notes that his last EGD and colonoscopy was in 2015 with pathology report as noted below showing Barrett's esophagus with no significant dysplasia or malignancy and colonoscopy showing tubular adenomas with no high-grade dysplasia or invasive adenocarcinoma.  Recent fecal other blood testing was negative. Patient has been [facility] and has been scheduled for a repeat colonoscopy on 02/26/2016.  Patient notes that the oral iron is bothering his stomach and causing him diarrhea. He notes that he is feeling very lethargic and would like to see if there is something  that might help him feel better soon. He notes that he babysits his granddaughter and has difficulties keeping up with her given his low energy levels.  We discussed the option of getting IV iron and also the importance of having a comprehensive GI workup to evaluate source of blood loss or absorption issues. He was also encouraged to talk to his cardiologist about whether he had a significant need to continue dual antiplatelet therapy order of one of the medications could be discontinued to reduce his risk of potential bleeding.  INTERVAL HISTORY  [name] is here for his scheduled followup. He notes he has an EGD and Colonoscopy with Dr Michail Sermon and reports no concerning findings and no evidnece of bleeding. Report not available to [place] currently. No overt GI bleeding. Notes energy levels improved after IV iron. Continues to be on ASA + Plavix.  MEDICAL HISTORY:  Past Medical History  Diagnosis Date  . Depression   . GERD (gastroesophageal reflux disease)   . Hypercholesterolemia   . Obesity   . HTN (hypertension)   . Dyspnea   . CAD (coronary artery disease)     ETT/Lexiscan-Myoview (11/14):  Normal; no ischemia, EF 67%  . COPD (chronic obstructive pulmonary disease) ([place]) 2009    spirometry with "moderate COPD"  . Colon polyp   . Myocardial infarction ([place])   . Sleep apnea   . Diabetes mellitus without complication ([place])   . Vertigo   . Adenomatous colon polyp 2011  . CKD (chronic kidney disease), stage III   . GI bleed     after polypectomy/admitted  . Barrett esophagus 12/2013  EGD  MI s/p PCI Last colonoscopy 2015 Small sliding type hiatal hernia. Sleep apnea - uses trazodone. Has never been on a CPAP machine. Welding related pleural scarring.   SURGICAL HISTORY: Past Surgical History  Procedure Laterality Date  . Appendectomy    . Varicocelectomy    . Tonsillectomy    . Pleural scarification  1987    SOCIAL HISTORY: Social History   Social History  .  Marital Status: Married    Spouse Name: N/A  . Number of Children: N/A  . Years of Education: N/A   Occupational History  . machinist    Social History Main Topics  . Smoking status: Former Smoker    Types: Cigarettes    Quit date: 11/25/1998  . Smokeless tobacco: Never Used  . Alcohol Use: No  . Drug Use: No  . Sexual Activity: Yes   Other Topics Concern  . Not on file   Social History Narrative   Married to [place].   Retired Production designer, theatre/television/film truck.    Drinks caffeine,  Takes a daily vitamin, wears a seatbelt   Wears a bicycle helmet.    Has dentures (partial on top)   Smoke detector in the home. No firearms in the home.    Feels safe in relationships.      FAMILY HISTORY: Family History  Problem Relation Age of Onset  . Coronary artery disease Father 28  . Hypertension    . Hyperlipidemia    . Heart disease Mother   . Heart disease Father   . Stroke Father   . CVA Mother   . Cancer - Other Brother     Renal cell carcinoma  . Colon polyps Sister   sister - lung cancer (smoker. Was about 75 yrs at diagnosis).  ALLERGIES:  is allergic to altace; codeine; tape; and zantac.  MEDICATIONS:  Current Outpatient Prescriptions  Medication Sig Dispense Refill  . aspirin EC 81 MG tablet Take 81 mg by mouth daily.     Marland Kitchen atorvastatin (LIPITOR) 80 MG tablet Take 1 tablet (80 mg total) by mouth daily. 90 tablet 1  . buPROPion (WELLBUTRIN XL) 300 MG 24 hr tablet Take 300 mg by mouth daily.     . clopidogrel (PLAVIX) 75 MG tablet Take 75 mg by mouth daily.    . metFORMIN (GLUCOPHAGE-XR) 500 MG 24 hr tablet Take 500 mg by mouth 3 (three) times daily.     . metoprolol (LOPRESSOR) 50 MG tablet Take 1 tablet (50 mg total) by mouth daily. 90 tablet 1  . Multiple Vitamin (MULTIVITAMIN) tablet Take 1 tablet by mouth daily.      . nitroGLYCERIN (NITROSTAT) 0.4 MG SL tablet Place 0.4 mg under the tongue every 5 (five) minutes as needed for chest pain (3 doses max).    . pantoprazole (PROTONIX) 40  MG tablet Take 1 tablet (40 mg total) by mouth daily. 90 tablet 1  . senna-docusate (SENOKOT-S) 8.6-50 MG tablet Take 2 tablets by mouth daily. 60 tablet 3  . SPIRIVA HANDIHALER 18 MCG inhalation capsule daily.    . SYMBICORT 160-4.5 MCG/ACT inhaler Inhale 2 puffs into the lungs 2 (two) times daily. 3 Inhaler 3  . traZODone (DESYREL) 50 MG tablet TAKE 3 TABLETS AT BEDTIME 90 tablet 0  . valsartan (DIOVAN) 160 MG tablet TAKE 1 TABLET  DAILY. (Patient taking differently: TAKE .5 TABLET  DAILY.) 90 tablet 2  . zoster vaccine live, PF, (ZOSTAVAX) 60454 UNT/0.65ML injection Inject 19,400 Units into the skin once.  1 each 0  . iron polysaccharides (NIFEREX) 150 MG capsule Take 1 capsule (150 mg total) by mouth daily. 30 capsule 2   No current facility-administered medications for this visit.    REVIEW OF SYSTEMS:    10 Point review of Systems was done is negative except as noted above.  PHYSICAL EXAMINATION: ECOG PERFORMANCE STATUS: 2 - Symptomatic, <50% confined to bed  . Filed Vitals:   04/05/16 1001  BP: 109/65  Pulse: 64  Temp: 98.4 F (36.9 C)  Resp: 18   Filed Weights   04/05/16 1001  Weight: 178 lb 6.4 oz (80.922 kg)   .Body mass index is 25.6 kg/(m^2).  Marland Kitchen Wt Readings from Last 3 Encounters:  04/05/16 178 lb 6.4 oz (80.922 kg)  02/21/16 191 lb 6.4 oz (86.818 kg)  02/20/16 192 lb 8 oz (87.317 kg)    GENERAL:alert, in no acute distress and comfortable SKIN: skin color, texture, turgor are normal, no rashes or significant lesions EYES: normal, conjunctiva are pink and non-injected, sclera clear OROPHARYNX:no exudate, no erythema and lips, buccal mucosa, and tongue normal  NECK: supple, no JVD, thyroid normal size, non-tender, without nodularity LYMPH:  no palpable lymphadenopathy in the cervical, axillary or inguinal LUNGS: clear to auscultation with normal respiratory effort HEART: regular rate & rhythm,  no murmurs and no lower extremity edema ABDOMEN: abdomen soft,  non-tender, normoactive bowel sounds  Musculoskeletal: no cyanosis of digits and no clubbing  PSYCH: alert & oriented x 3 with fluent speech NEURO: no focal motor/sensory deficits  LABORATORY DATA:  I have reviewed the data as listed  . CBC Latest Ref Rng 04/05/2016 02/20/2016 01/15/2016  WBC 4.0 - 10.3 10e3/uL 6.7 6.6 7.1  Hemoglobin 13.0 - 17.1 g/dL 11.1(L) 9.1(L) 8.9 Repeated and verified X2.(L)  Hematocrit 38.4 - 49.9 % 33.7(L) 29.2(L) 27.8(L)  Platelets 140 - 400 10e3/uL 184 233 249.0   . CBC    Component Value Date/Time   WBC 6.7 04/05/2016 0945   WBC 6.6 02/20/2016 1118   RBC 4.20 04/05/2016 0945   RBC 3.93* 02/20/2016 1118   RBC 3.93* 02/20/2016 1118   HGB 11.1* 04/05/2016 0945   HGB 9.1* 02/20/2016 1118   HCT 33.7* 04/05/2016 0945   HCT 29.2* 02/20/2016 1118   PLT 184 04/05/2016 0945   PLT 233 02/20/2016 1118   MCV 80.3 04/05/2016 0945   MCV 74.3* 02/20/2016 1118   MCH 26.5* 04/05/2016 0945   MCH 23.2* 02/20/2016 1118   MCHC 33.0 04/05/2016 0945   MCHC 31.2 02/20/2016 1118   RDW 21.2* 04/05/2016 0945   RDW 18.7* 02/20/2016 1118   LYMPHSABS 1.3 04/05/2016 0945   LYMPHSABS 1.3 02/20/2016 1118   MONOABS 0.4 04/05/2016 0945   MONOABS 0.3 02/20/2016 1118   EOSABS 0.2 04/05/2016 0945   EOSABS 0.2 02/20/2016 1118   BASOSABS 0.0 04/05/2016 0945   BASOSABS 0.1 02/20/2016 1118     . CMP Latest Ref Rng 04/05/2016 02/20/2016 01/15/2016  Glucose 70 - 140 mg/dl 136 114(H) 176(H)  BUN 7.0 - 26.0 mg/dL 12.1 19 34(H)  Creatinine 0.7 - 1.3 mg/dL 1.5(H) 1.37 1.56(H)  Sodium 136 - 145 mEq/L 142 140 136  Potassium 3.5 - 5.1 mEq/L 4.1 4.7 4.9  Chloride 96 - 112 mEq/L - 106 106  CO2 22 - 29 mEq/L 25 25 22   Calcium 8.4 - 10.4 mg/dL 9.8 9.2 9.5  Total Protein 6.4 - 8.3 g/dL 6.4 - -  Total Bilirubin 0.20 - 1.20 mg/dL 0.54 - -  Alkaline Phos 40 - 150 U/L 63 - -  AST 5 - 34 U/L 32 - -  ALT 0 - 55 U/L 59(H) - -   . Lab Results  Component Value Date   IRON 38* 04/05/2016    TIBC 220 04/05/2016   IRONPCTSAT 17* 04/05/2016   (Iron and TIBC)  Lab Results  Component Value Date   FERRITIN 107 04/05/2016   Component     Latest Ref Rng 02/20/2016 02/20/2016        10:34 AM 11:18 AM  Retic Ct Pct     0.4 - 2.3 %  1.3  RBC.     4.22 - 5.81 MIL/uL  3.93 (L)  ABS Retic     19.0 - 186.0 K/uL  51.1  Ferritin     22.0 - 322.0 ng/mL 3.9 (L)   Vitamin B12     200 - 1100 pg/mL 1170 (H) 1282 (H)  Folate     >5.4 ng/mL >23.3 >24.0      RADIOGRAPHIC STUDIES: I have personally reviewed the radiological images as listed and agreed with the findings in the report. No results found.  ASSESSMENT & PLAN:   70 year old Caucasian male with  #1 symptomatic significant microcytic anemia due to severe iron deficiency. Cannot rule out an element of anemia due to chronic kidney disease.Ferritin much improved with IV iron. Hgb improved to 11.1 #2 iron deficiency -unclear etiology possible GI chronic blood loss versus poor absorption due to chronic PPI therapy. EGD -Barrettes without dysplasia or malignancy. COlonosocpy - reportedly WNL. #3 coronary artery disease status post PCI on dual antiplatelet therapy with aspirin and Plavix. #4 COPD #5 dyslipidemia #6 diabetes on metformin #7 GI intolerance to oral iron with significant diarrhea and some nausea. #8 history of recurrent adenomatous colonic polyps .   plan -no indication for additional IV Iron at this time -iron polysaccharide 1 tab po daily -continue GI workup with Dr Michail Sermon ?need for capsule endoscopy -discuss with cardiologist need for continued dual anti-platelet therapy -Continue follow-up with primary care physician for management of other chronic medical issues.  Return to care with Dr. Irene Limbo in 3 months with repeat labs     Sullivan Lone MD [place] AAHIVMS [facility] Hematology/Oncology Physician [place]  (Office):       [phone] (Work cell):  [phone] (Fax):            [phone]  04/07/2016 10:53 PM   . Orders Placed This Encounter  Procedures  . CBC & Diff and Retic    Standing Status: Future     Number of Occurrences:      Standing Expiration Date: 05/10/2017  . Comprehensive metabolic panel    Standing Status: Future     Number of Occurrences:      Standing Expiration Date: 04/05/2017  . Ferritin    Standing Status: Future     Number of Occurrences:      Standing Expiration Date: 04/05/2017

## 2016-04-11 ENCOUNTER — Other Ambulatory Visit: Payer: Self-pay | Admitting: Family Medicine

## 2016-04-16 DIAGNOSIS — R3914 Feeling of incomplete bladder emptying: Secondary | ICD-10-CM | POA: Diagnosis not present

## 2016-04-16 DIAGNOSIS — R972 Elevated prostate specific antigen [PSA]: Secondary | ICD-10-CM | POA: Diagnosis not present

## 2016-04-16 DIAGNOSIS — Z0000 Encounter for general adult medical examination without abnormal findings: Secondary | ICD-10-CM | POA: Diagnosis not present

## 2016-04-16 DIAGNOSIS — R338 Other retention of urine: Secondary | ICD-10-CM | POA: Diagnosis not present

## 2016-04-16 DIAGNOSIS — N401 Enlarged prostate with lower urinary tract symptoms: Secondary | ICD-10-CM | POA: Diagnosis not present

## 2016-05-09 ENCOUNTER — Encounter: Payer: Self-pay | Admitting: Family Medicine

## 2016-05-17 DIAGNOSIS — R972 Elevated prostate specific antigen [PSA]: Secondary | ICD-10-CM | POA: Diagnosis not present

## 2016-05-20 ENCOUNTER — Encounter: Payer: Self-pay | Admitting: Family Medicine

## 2016-05-20 ENCOUNTER — Ambulatory Visit (INDEPENDENT_AMBULATORY_CARE_PROVIDER_SITE_OTHER): Payer: Commercial Managed Care - HMO | Admitting: Family Medicine

## 2016-05-20 VITALS — BP 99/65 | HR 65 | Temp 98.2°F | Resp 20 | Ht 70.0 in | Wt 172.0 lb

## 2016-05-20 DIAGNOSIS — R5383 Other fatigue: Secondary | ICD-10-CM | POA: Diagnosis not present

## 2016-05-20 DIAGNOSIS — F329 Major depressive disorder, single episode, unspecified: Secondary | ICD-10-CM

## 2016-05-20 DIAGNOSIS — R634 Abnormal weight loss: Secondary | ICD-10-CM | POA: Diagnosis not present

## 2016-05-20 DIAGNOSIS — F32A Depression, unspecified: Secondary | ICD-10-CM

## 2016-05-20 LAB — CBC WITH DIFFERENTIAL/PLATELET
BASOS ABS: 0 10*3/uL (ref 0.0–0.1)
Basophils Relative: 0.4 % (ref 0.0–3.0)
EOS PCT: 2.4 % (ref 0.0–5.0)
Eosinophils Absolute: 0.2 10*3/uL (ref 0.0–0.7)
HEMATOCRIT: 35.1 % — AB (ref 39.0–52.0)
HEMOGLOBIN: 11.9 g/dL — AB (ref 13.0–17.0)
Lymphocytes Relative: 17.7 % (ref 12.0–46.0)
Lymphs Abs: 1.4 10*3/uL (ref 0.7–4.0)
MCHC: 33.9 g/dL (ref 30.0–36.0)
MCV: 83.2 fl (ref 78.0–100.0)
MONOS PCT: 4.7 % (ref 3.0–12.0)
Monocytes Absolute: 0.4 10*3/uL (ref 0.1–1.0)
NEUTROS PCT: 74.8 % (ref 43.0–77.0)
Neutro Abs: 5.9 10*3/uL (ref 1.4–7.7)
Platelets: 225 10*3/uL (ref 150.0–400.0)
RBC: 4.22 Mil/uL (ref 4.22–5.81)
RDW: 17.5 % — ABNORMAL HIGH (ref 11.5–15.5)
WBC: 7.9 10*3/uL (ref 4.0–10.5)

## 2016-05-20 LAB — C-REACTIVE PROTEIN: CRP: 0.1 mg/dL — AB (ref 0.5–20.0)

## 2016-05-20 LAB — HEMOGLOBIN A1C: Hgb A1c MFr Bld: 5.7 % (ref 4.6–6.5)

## 2016-05-20 LAB — TSH: TSH: 1.98 u[IU]/mL (ref 0.35–4.50)

## 2016-05-20 LAB — VITAMIN D 25 HYDROXY (VIT D DEFICIENCY, FRACTURES): VITD: 55.7 ng/mL (ref 30.00–100.00)

## 2016-05-20 MED ORDER — MIRTAZAPINE 15 MG PO TBDP: 15.0000 mg | ORAL_TABLET | Freq: Every day | ORAL | Status: DC

## 2016-05-20 NOTE — Progress Notes (Signed)
Patient ID: [name], male   DOB: 05-24-1946, 70 y.o.   MRN: [id number]    [name] , 08/08/1946, 70 y.o., male MRN: [id number]  CC: fatigue Subjective: Pt presents for an OV with continued complaints of fatigue. He has been diagnosed with iron deficiency anemia and receiving iron supplementation IV. He is not prescribed iron QD. He states he is not routinely taking this mediation because it is upsetting his stomach. He is losing weight. He states he is not meaning to continue losing weight. He has lost an additional 6 lbs, without trying. He is not eating well, he states nothing taste good and he gets full quickly. He has been referred to GI with EGD and colonoscopy completed on 03/2016. EGD with long segment of barrett's esophagus and erosion.  Colonoscopy with internal hemorrhoids. Pt denies any melana or hematochezia. Patient does not feel his fatigue has yet improved despite therapy. His BP is still low range considering valsartan dose had been cut back last visit. He has a long standing history of depression that may be the actual cause of his continued fatigue. He is on medications for depression (trazodone, Wellbutrin) but admits he still does not feel it is controlled.   Allergies  Allergen Reactions  . Altace [Ramipril] Cough  . Codeine     Makes sick  . Tape     Blisters   . Zantac [Ranitidine Hcl] Dermatitis   Social History  Substance Use Topics  . Smoking status: Former Smoker    Types: Cigarettes    Quit date: 11/25/1998  . Smokeless tobacco: Never Used  . Alcohol Use: No   Past Medical History  Diagnosis Date  . Depression   . GERD (gastroesophageal reflux disease)   . Hypercholesterolemia   . Obesity   . HTN (hypertension)   . Dyspnea   . CAD (coronary artery disease)     ETT/Lexiscan-Myoview (11/14):  Normal; no ischemia, EF 67%  . COPD (chronic obstructive pulmonary disease) ([place]) 2009    spirometry with "moderate COPD"  . Colon polyp 09/2010   adenoma  . Myocardial infarction ([place])   . Sleep apnea   . Diabetes mellitus without complication ([place])   . Vertigo   . Adenomatous colon polyp 2011  . CKD (chronic kidney disease), stage III   . GI bleed     after polypectomy/admitted  . Barrett esophagus 12/2013    EGD  . Spontaneous pneumothorax    Past Surgical History  Procedure Laterality Date  . Appendectomy    . Varicocelectomy    . Tonsillectomy    . Pleural scarification  1987   Family History  Problem Relation Age of Onset  . Coronary artery disease Father 31  . Hypertension    . Hyperlipidemia    . Heart disease Mother   . Heart disease Father   . Stroke Father   . CVA Mother   . Cancer - Other Brother     Renal cell carcinoma  . Colon polyps Sister      Medication List       This list is accurate as of: 05/20/16  8:12 AM.  Always use your most recent med list.               aspirin EC 81 MG tablet  Take 81 mg by mouth daily.     atorvastatin 80 MG tablet  Commonly known as:  LIPITOR  Take 1 tablet (80 mg total) by mouth daily.  buPROPion 300 MG 24 hr tablet  Commonly known as:  WELLBUTRIN XL  Take 300 mg by mouth daily.     clopidogrel 75 MG tablet  Commonly known as:  PLAVIX  Take 75 mg by mouth daily.     iron polysaccharides 150 MG capsule  Commonly known as:  NIFEREX  Take 1 capsule (150 mg total) by mouth daily.     metFORMIN 500 MG 24 hr tablet  Commonly known as:  GLUCOPHAGE-XR  Take 500 mg by mouth 3 (three) times daily.     metoprolol 50 MG tablet  Commonly known as:  LOPRESSOR  Take 1 tablet (50 mg total) by mouth daily.     multivitamin tablet  Take 1 tablet by mouth daily.     nitroGLYCERIN 0.4 MG SL tablet  Commonly known as:  NITROSTAT  Place 0.4 mg under the tongue every 5 (five) minutes as needed for chest pain (3 doses max).     pantoprazole 40 MG tablet  Commonly known as:  PROTONIX  Take 1 tablet (40 mg total) by mouth daily.     senna-docusate 8.6-50 MG  tablet  Commonly known as:  Senokot-S  Take 2 tablets by mouth daily.     SPIRIVA HANDIHALER 18 MCG inhalation capsule  Generic drug:  tiotropium  daily.     SYMBICORT 160-4.5 MCG/ACT inhaler  Generic drug:  budesonide-formoterol  Inhale 2 puffs into the lungs 2 (two) times daily.     tamsulosin 0.4 MG Caps capsule  Commonly known as:  FLOMAX  TAKE 1 CAPSULE BY MOUTH ONCE AT NIGHT AT BEDTIME     traZODone 50 MG tablet  Commonly known as:  DESYREL  TAKE 3 TABLETS AT BEDTIME     valsartan 160 MG tablet  Commonly known as:  DIOVAN  TAKE 1 TABLET  DAILY.     zoster vaccine live (PF) 19400 UNT/0.65ML injection  Commonly known as:  ZOSTAVAX  Inject 19,400 Units into the skin once.        ROS: Negative, with the exception of above mentioned in HPI  Objective:  BP 99/65 mmHg  Pulse 65  Temp(Src) 98.2 F (36.8 C)  Resp 20  Ht 5\' 10"  (1.778 m)  Wt 172 lb (78.019 kg)  BMI 24.68 kg/m2  SpO2 97% Body mass index is 24.68 kg/(m^2). Gen: Afebrile. No acute distress. Nontoxic in appearance, pleasant caucasian male.  HENT: AT. [place]. MMM, no oral lesions.  Eyes:Pupils Equal Round Reactive to light, Extraocular movements intact,  Conjunctiva without redness, discharge or icterus. Neck/lymp/endocrine: Supple,no lymphadenopathy, no thyromegaly CV: RRR , no edema Chest: CTAB, no wheeze or crackles.  Abd: Soft. NTND. BS present.  Skin: no rashes, purpura or petechiae.  Neuro: Normal gait. PERLA. EOMi. Alert. Oriented x3  Psych: Normal affect, dress and demeanor. Normal speech. Normal thought content and judgment. Smiling.   Assessment/Plan: [name] is a 70 y.o. male present for  OV for   Depression - add remeron to help with depression and appetite.  - pt has refused therapy in the past and has been tried on many medications with SE to each (cymbalta, effexor, lexapro, nortriptyline).  - mirtazapine (REMERON SOL-TAB) 15 MG disintegrating tablet; Take 1 tablet (15 mg total) by  mouth at bedtime.  Dispense: 30 tablet; Refill: 1 - F/U 4 weeks. Could increase dose if needed.   Loss of weight - EGD and colonoscopy reassuring. Pt seeing heme/onc for iron deficiency anemia.  - No other systemic symptoms,  other depression/fatigue.  - CBC w/Diff - HgB A1c - TSH - C-reactive protein - Hepatitis C Antibody - HIV antibody (with reflex) - could consider cxr/CT for smoking history  Other fatigue - unknown etiology. Pt seeing urology, pulmonology ad gastrology.  - suspect continued iron deficiency. Pt strongly encouraged to take iron daily.  - Possible depression causing systemic symptoms, added Remeron today.  - possible low calorie/poor diet: Remeron added and pt encouraged to eat 3 meals a day.  - CBC w/Diff - HgB A1c - VITAMIN D 25 Hydroxy (Vit-D Deficiency, Fractures) - TSH - C-reactive protein - Hepatitis C Antibody - HIV antibody (with reflex) - Hypotension: may be a cause, his blood pressure continues to run low. D/C diovan and monitor. Nurse check in 4 days.  - Does he need continued plavix and ASA, he has yet to dicussed with cards. Could be source of continued iron deficiency.  - Pt would like to taper protonix, uncertant he will be able to tolerate, but instructed him on tapering and to return to the dose prior to return of symptoms if they return.   F/U 4 weeks, sooner if labs indicate electronically signed by:  Howard Pouch, DO  [place]

## 2016-05-20 NOTE — Patient Instructions (Addendum)
I am going to check some labs today.  Stop using Valsartan,and take BP daily. If above 140/90 restart medicine and call in. --> Nurse BP check this week.  Taper Protonix to once a day for 2 weeks, then if ok and symptoms do not return taper to every other day for two weeks and so on. If symtpoms return go back to dose when you had no symptoms.  You need to stay on iron.  Eat three good meals a day.  I will start a medicine called Remeron. This is a depression medicine, but also helps with appetite.       Iron-Rich Diet Iron is a mineral that helps your body to produce hemoglobin. Hemoglobin is a protein in your red blood cells that carries oxygen to your body's tissues. Eating too little iron may cause you to feel weak and tired, and it can increase your risk for infection. Eating enough iron is necessary for your body's metabolism, muscle function, and nervous system. Iron is naturally found in many foods. It can also be added to foods or fortified in foods. There are two types of dietary iron:  Heme iron. Heme iron is absorbed by the body more easily than nonheme iron. Heme iron is found in meat, poultry, and fish.  Nonheme iron. Nonheme iron is found in dietary supplements, iron-fortified grains, beans, and vegetables. You may need to follow an iron-rich diet if:  You have been diagnosed with iron deficiency or iron-deficiency anemia.  You have a condition that prevents you from absorbing dietary iron, such as:  Infection in your intestines.  Celiac disease. This involves long-lasting (chronic) inflammation of your intestines.  You do not eat enough iron.  You eat a diet that is high in foods that impair iron absorption.  You have lost a lot of blood.  You have heavy bleeding during your menstrual cycle.  You are pregnant. WHAT IS MY PLAN? Your health care provider may help you to determine how much iron you need per day based on your condition. Generally, when a person  consumes sufficient amounts of iron in the diet, the following iron needs are met:  Men.  77-24 years old: 11 mg per day.  82-18 years old: 8 mg per day.  Women.   85-76 years old: 15 mg per day.  70-21 years old: 18 mg per day.  Over 66 years old: 8 mg per day.  Pregnant women: 27 mg per day.  Breastfeeding women: 9 mg per day. WHAT DO I NEED TO KNOW ABOUT AN IRON-RICH DIET?  Eat fresh fruits and vegetables that are high in vitamin C along with foods that are high in iron. This will help increase the amount of iron that your body absorbs from food, especially with foods containing nonheme iron. Foods that are high in vitamin C include oranges, peppers, tomatoes, and mango.  Take iron supplements only as directed by your health care provider. Overdose of iron can be life-threatening. If you were prescribed iron supplements, take them with orange juice or a vitamin C supplement.  Cook foods in pots and pans that are made from iron.   Eat nonheme iron-containing foods alongside foods that are high in heme iron. This helps to improve your iron absorption.   Certain foods and drinks contain compounds that impair iron absorption. Avoid eating these foods in the same meal as iron-rich foods or with iron supplements. These include:  Coffee, black tea, and red wine.  Milk, dairy products,  and foods that are high in calcium.  Beans, soybeans, and peas.  Whole grains.  When eating foods that contain both nonheme iron and compounds that impair iron absorption, follow these tips to absorb iron better.   Soak beans overnight before cooking.  Soak whole grains overnight and drain them before using.  Ferment flours before baking, such as using yeast in bread dough. WHAT FOODS CAN I EAT? Grains Iron-fortified breakfast cereal. Iron-fortified whole-wheat bread. Enriched rice. Sprouted grains. Vegetables Spinach. Potatoes with skin. Green peas. Broccoli. Red and green bell  peppers. Fermented vegetables. Fruits Prunes. Raisins. Oranges. Strawberries. Mango. Grapefruit. Meats and Other Protein Sources Beef liver. Oysters. Beef. Shrimp. [place]. Chicken. JAARS. Sardines. Chickpeas. Nuts. Tofu. Beverages Tomato juice. Fresh orange juice. Prune juice. Hibiscus tea. Fortified instant breakfast shakes. Condiments Tahini. Fermented soy sauce. Sweets and Desserts Black-strap molasses.  Other Wheat germ. The items listed above may not be a complete list of recommended foods or beverages. Contact your dietitian for more options. WHAT FOODS ARE NOT RECOMMENDED? Grains Whole grains. Bran cereal. Bran flour. Oats. Vegetables Artichokes. Brussels sprouts. Kale. Fruits Blueberries. Raspberries. Strawberries. Figs. Meats and Other Protein Sources Soybeans. Products made from soy protein. Dairy Milk. Cream. Cheese. Yogurt. Cottage cheese. Beverages Coffee. Black tea. Red wine. Sweets and Desserts Cocoa. Chocolate. Ice cream. Other Basil. Oregano. Parsley. The items listed above may not be a complete list of foods and beverages to avoid. Contact your dietitian for more information.   This information is not intended to replace advice given to you by your health care provider. Make sure you discuss any questions you have with your health care provider.   Document Released: 06/25/2005 Document Revised: 12/02/2014 Document Reviewed: 06/08/2014 Elsevier Interactive Patient Education [employer].

## 2016-05-21 ENCOUNTER — Telehealth: Payer: Self-pay | Admitting: Family Medicine

## 2016-05-21 LAB — HIV ANTIBODY (ROUTINE TESTING W REFLEX): HIV: NONREACTIVE

## 2016-05-21 LAB — HEPATITIS C ANTIBODY: HCV Ab: NEGATIVE

## 2016-05-21 MED ORDER — METFORMIN HCL ER 500 MG PO TB24: 500.0000 mg | ORAL_TABLET | Freq: Two times a day (BID) | ORAL | Status: DC

## 2016-05-21 NOTE — Telephone Encounter (Signed)
Please call pt: - His labs are stable. - There is no inflammatory process, worsening anemia or thyroid d/o to explain his fatigue. - his hep c and hiv were negative. - Vit D looks great. - His a1c is 5.7, I believe we can cut back on the metformin to twice a day.  - He has a BP re-check scheduled this week, he also needs to schedule a provider appt in 3 -4 weeks to see if the new medication and the change to his current medications have helped his energy.

## 2016-05-21 NOTE — Telephone Encounter (Signed)
Spoke with patient reviewed lab results and instructions with patient. Patient verbalized understanding . 

## 2016-05-24 ENCOUNTER — Ambulatory Visit (INDEPENDENT_AMBULATORY_CARE_PROVIDER_SITE_OTHER): Payer: Commercial Managed Care - HMO | Admitting: Family Medicine

## 2016-05-24 VITALS — BP 119/72 | HR 69 | Resp 20

## 2016-05-24 DIAGNOSIS — I10 Essential (primary) hypertension: Secondary | ICD-10-CM | POA: Diagnosis not present

## 2016-05-24 NOTE — Progress Notes (Signed)
Patient presents today for BP check. Patient BP within normal limits. Dr Raoul Pitch aware. Continue current regimen.  BP 119/72 mmHg  Pulse 69  Resp 20  SpO2 99% Pt feeling well. DC'd Valsartan and BP is more stable and WNL.  Continue lopressor. F/U 3 months Electronically Signed by: Howard Pouch, DO [facility] [place]

## 2016-05-27 ENCOUNTER — Other Ambulatory Visit: Payer: Self-pay | Admitting: Nurse Practitioner

## 2016-05-30 ENCOUNTER — Other Ambulatory Visit: Payer: Self-pay | Admitting: Family Medicine

## 2016-06-19 ENCOUNTER — Telehealth: Payer: Self-pay | Admitting: Hematology

## 2016-06-19 NOTE — Telephone Encounter (Signed)
Appointment confirmed with patient. [name] °

## 2016-07-11 ENCOUNTER — Other Ambulatory Visit: Payer: Commercial Managed Care - HMO

## 2016-07-11 ENCOUNTER — Ambulatory Visit: Payer: Commercial Managed Care - HMO | Admitting: Hematology

## 2016-07-19 ENCOUNTER — Telehealth: Payer: Self-pay | Admitting: Hematology

## 2016-07-19 NOTE — Telephone Encounter (Signed)
07/23/2016 APPOINTMENT RESCHEDULED TO 07/30/2016 PER PATIENT REQUEST.

## 2016-07-23 ENCOUNTER — Other Ambulatory Visit: Payer: Commercial Managed Care - HMO

## 2016-07-23 ENCOUNTER — Ambulatory Visit: Payer: Commercial Managed Care - HMO | Admitting: Hematology

## 2016-07-30 ENCOUNTER — Ambulatory Visit (HOSPITAL_BASED_OUTPATIENT_CLINIC_OR_DEPARTMENT_OTHER): Payer: Commercial Managed Care - HMO | Admitting: Hematology

## 2016-07-30 ENCOUNTER — Telehealth: Payer: Self-pay | Admitting: Hematology

## 2016-07-30 ENCOUNTER — Other Ambulatory Visit (HOSPITAL_BASED_OUTPATIENT_CLINIC_OR_DEPARTMENT_OTHER): Payer: Commercial Managed Care - HMO

## 2016-07-30 ENCOUNTER — Encounter: Payer: Self-pay | Admitting: Hematology

## 2016-07-30 VITALS — BP 126/72 | HR 64 | Temp 98.5°F | Resp 18 | Ht 70.0 in | Wt 171.4 lb

## 2016-07-30 DIAGNOSIS — K922 Gastrointestinal hemorrhage, unspecified: Secondary | ICD-10-CM

## 2016-07-30 DIAGNOSIS — E611 Iron deficiency: Secondary | ICD-10-CM

## 2016-07-30 DIAGNOSIS — D509 Iron deficiency anemia, unspecified: Secondary | ICD-10-CM

## 2016-07-30 DIAGNOSIS — J449 Chronic obstructive pulmonary disease, unspecified: Secondary | ICD-10-CM | POA: Diagnosis not present

## 2016-07-30 DIAGNOSIS — D500 Iron deficiency anemia secondary to blood loss (chronic): Secondary | ICD-10-CM

## 2016-07-30 DIAGNOSIS — E119 Type 2 diabetes mellitus without complications: Secondary | ICD-10-CM

## 2016-07-30 DIAGNOSIS — E785 Hyperlipidemia, unspecified: Secondary | ICD-10-CM

## 2016-07-30 LAB — CBC & DIFF AND RETIC
BASO%: 0.3 % (ref 0.0–2.0)
Basophils Absolute: 0 10*3/uL (ref 0.0–0.1)
EOS ABS: 0.3 10*3/uL (ref 0.0–0.5)
EOS%: 3.5 % (ref 0.0–7.0)
HCT: 36.9 % — ABNORMAL LOW (ref 38.4–49.9)
HEMOGLOBIN: 12.4 g/dL — AB (ref 13.0–17.1)
IMMATURE RETIC FRACT: 2.5 % — AB (ref 3.00–10.60)
LYMPH#: 1.8 10*3/uL (ref 0.9–3.3)
LYMPH%: 25 % (ref 14.0–49.0)
MCH: 28.7 pg (ref 27.2–33.4)
MCHC: 33.6 g/dL (ref 32.0–36.0)
MCV: 85.4 fL (ref 79.3–98.0)
MONO#: 0.5 10*3/uL (ref 0.1–0.9)
MONO%: 7.1 % (ref 0.0–14.0)
NEUT%: 64.1 % (ref 39.0–75.0)
NEUTROS ABS: 4.7 10*3/uL (ref 1.5–6.5)
Platelets: 192 10*3/uL (ref 140–400)
RBC: 4.32 10*6/uL (ref 4.20–5.82)
RDW: 13.6 % (ref 11.0–14.6)
RETIC %: 1.4 % (ref 0.80–1.80)
RETIC CT ABS: 60.48 10*3/uL (ref 34.80–93.90)
WBC: 7.4 10*3/uL (ref 4.0–10.3)

## 2016-07-30 LAB — COMPREHENSIVE METABOLIC PANEL
ALBUMIN: 3.7 g/dL (ref 3.5–5.0)
ALK PHOS: 64 U/L (ref 40–150)
ALT: 30 U/L (ref 0–55)
AST: 19 U/L (ref 5–34)
Anion Gap: 11 mEq/L (ref 3–11)
BILIRUBIN TOTAL: 0.62 mg/dL (ref 0.20–1.20)
BUN: 22.1 mg/dL (ref 7.0–26.0)
CO2: 26 mEq/L (ref 22–29)
Calcium: 9.5 mg/dL (ref 8.4–10.4)
Chloride: 105 mEq/L (ref 98–109)
Creatinine: 1.3 mg/dL (ref 0.7–1.3)
EGFR: 57 mL/min/{1.73_m2} — AB (ref >90)
GLUCOSE: 83 mg/dL (ref 70–140)
Potassium: 4.5 mEq/L (ref 3.5–5.1)
SODIUM: 142 meq/L (ref 136–145)
TOTAL PROTEIN: 6.8 g/dL (ref 6.4–8.3)

## 2016-07-30 NOTE — Telephone Encounter (Signed)
GAVE PATIENT AVS REPORT AND APPOINTMENTS FOR MARCH  °

## 2016-07-31 LAB — FERRITIN: Ferritin: 27 ng/ml (ref 22–316)

## 2016-08-15 ENCOUNTER — Other Ambulatory Visit: Payer: Self-pay | Admitting: *Deleted

## 2016-08-15 MED ORDER — BUPROPION HCL ER (XL) 300 MG PO TB24: 300.0000 mg | ORAL_TABLET | Freq: Every day | ORAL | 1 refills | Status: DC

## 2016-08-18 NOTE — Progress Notes (Signed)
Marland Kitchen    HEMATOLOGY/ONCOLOGY CLINIC ON THE SOMEWHAT "full" NOTE  Encounter Date.07/30/2016  Patient Care Team: [name], DO as PCP - General (Family Medicine)  CHIEF COMPLAINTS/PURPOSE OF CONSULTATION:  Severe iron deficiency anemia   HISTORY OF PRESENTING ILLNESS:   [name] is a wonderful 70 y.o. male who has been referred to [place] by Dr .Howard Pouch, DO for evaluation and management of significant iron deficiency anemia.  Patient has a history of hypertension, dyslipidemia, obesity, Chronic GERD with Barrett's esophagus, adenomatous colonic polyps, CKD stage III, coronary disease status post PCI on dual antiplatelet therapy with aspirin and Plavix, sleep apnea not requiring CPAP machine.  He was recently seen by his primary care physician for significant fatigue and was noted to have a hemoglobin of 8.9 with microcytic indices   And significantly low ferritin level of less than 5 with a low iron saturation. His vitamin 123456 and folic acid levels were within normal limits. He notes no overt GI bleeding though he has had some diarrhea and darker stools since his been started on oral ferrous sulfate 3 times a day about 2 weeks ago. He notes significant fatigue and lethargy.  He has been on chronic PPI therapy for his GERD and Barrett's esophagus.  Has had previous history of GI bleeding related to polypectomy in 2011. Notes that his last EGD and colonoscopy was in 2015 with pathology report as noted below showing Barrett's esophagus with no significant dysplasia or malignancy and colonoscopy showing tubular adenomas with no high-grade dysplasia or invasive adenocarcinoma.  Recent fecal other blood testing was negative. Patient has been [facility] and has been scheduled for a repeat colonoscopy on 02/26/2016.  Patient notes that the oral iron is bothering his stomach and causing him diarrhea. He notes that he is feeling very lethargic and would like to see if  there is something that might help him feel better soon. He notes that he babysits his granddaughter and has difficulties keeping up with her given his low energy levels.  We discussed the option of getting IV iron and also the importance of having a comprehensive GI workup to evaluate source of blood loss or absorption issues. He was also encouraged to talk to his cardiologist about whether he had a significant need to continue dual antiplatelet therapy order of one of the medications could be discontinued to reduce his risk of potential bleeding.   Patient will colonoscopy on 04/02/2016 that showed no overt evidence of bleeding and grade 1 internal hemorrhoids. EGD done on 04/02/2016 showed  INTERVAL HISTORY  [name] is here for his scheduled followup for iron deficiency anemia. His hemoglobin levels appear to be stable but ferritin levels are dropping. He notes no acute fatigue or symptoms of lethargy as previously. No overt GI bleeding. Continues to be on ASA + Plavix.  MEDICAL HISTORY:  Past Medical History:  Diagnosis Date  . Adenomatous colon polyp 2011  . Barrett esophagus 12/2013   EGD  . CAD (coronary artery disease)    ETT/Lexiscan-Myoview (11/14):  Normal; no ischemia, EF 67%  . CKD (chronic kidney disease), stage III   . Colon polyp 09/2010   adenoma  . COPD (chronic obstructive pulmonary disease) ([place]) 2009   spirometry with "moderate COPD"  . Depression   . Diabetes mellitus without complication ([place])   . Dyspnea   . GERD (gastroesophageal reflux disease)   . GI bleed    after polypectomy/admitted  . HTN (hypertension)   .  Hypercholesterolemia   . Myocardial infarction ([place])   . Obesity   . Sleep apnea   . Spontaneous pneumothorax   . Vertigo   MI s/p PCI Last colonoscopy 2015 Small sliding type hiatal hernia. Sleep apnea - uses trazodone. Has never been on a CPAP machine. Welding related pleural scarring.   SURGICAL HISTORY: Past Surgical History:  Procedure  Laterality Date  . APPENDECTOMY    . PLEURAL SCARIFICATION  1987  . TONSILLECTOMY    . VARICOCELECTOMY      SOCIAL HISTORY: Social History   Social History  . Marital status: Married    Spouse name: N/A  . Number of children: N/A  . Years of education: N/A   Occupational History  . machinist    Social History Main Topics  . Smoking status: Former Smoker    Types: Cigarettes    Quit date: 11/25/1998  . Smokeless tobacco: Never Used  . Alcohol use No  . Drug use: No  . Sexual activity: Yes   Other Topics Concern  . Not on file   Social History Narrative   Married to [place].   Retired Production designer, theatre/television/film truck.    Drinks caffeine,  Takes a daily vitamin, wears a seatbelt   Wears a bicycle helmet.    Has dentures (partial on top)   Smoke detector in the home. No firearms in the home.    Feels safe in relationships.      FAMILY HISTORY: Family History  Problem Relation Age of Onset  . Heart disease Mother   . CVA Mother   . Coronary artery disease Father 100  . Heart disease Father   . Stroke Father   . Hypertension    . Hyperlipidemia    . Cancer - Other Brother     Renal cell carcinoma  . Colon polyps Sister   sister - lung cancer (smoker. Was about 84 yrs at diagnosis).  ALLERGIES:  is allergic to altace [ramipril]; codeine; tape; and zantac [ranitidine hcl].  MEDICATIONS:  Current Outpatient Prescriptions  Medication Sig Dispense Refill  . aspirin EC 81 MG tablet Take 81 mg by mouth daily.     Marland Kitchen atorvastatin (LIPITOR) 80 MG tablet Take 1 tablet (80 mg total) by mouth daily. 90 tablet 1  . buPROPion (WELLBUTRIN XL) 300 MG 24 hr tablet Take 1 tablet (300 mg total) by mouth daily. 90 tablet 1  . clopidogrel (PLAVIX) 75 MG tablet Take 75 mg by mouth daily.    . iron polysaccharides (NIFEREX) 150 MG capsule Take 1 capsule (150 mg total) by mouth daily. (Patient not taking: Reported on 05/20/2016) 30 capsule 2  . metFORMIN (GLUCOPHAGE-XR) 500 MG 24 hr tablet Take 1 tablet (500  mg total) by mouth 2 (two) times daily. 60 tablet 0  . metoprolol (LOPRESSOR) 50 MG tablet Take 1 tablet (50 mg total) by mouth daily. 90 tablet 1  . mirtazapine (REMERON SOL-TAB) 15 MG disintegrating tablet Take 1 tablet (15 mg total) by mouth at bedtime. 30 tablet 1  . Multiple Vitamin (MULTIVITAMIN) tablet Take 1 tablet by mouth daily.      . nitroGLYCERIN (NITROSTAT) 0.4 MG SL tablet Place 0.4 mg under the tongue every 5 (five) minutes as needed for chest pain (3 doses max).    . pantoprazole (PROTONIX) 40 MG tablet Take 1 tablet (40 mg total) by mouth daily. 90 tablet 1  . senna-docusate (SENOKOT-S) 8.6-50 MG tablet Take 2 tablets by mouth daily. 60 tablet 3  . SPIRIVA  HANDIHALER 18 MCG inhalation capsule daily.    . SYMBICORT 160-4.5 MCG/ACT inhaler Inhale 2 puffs into the lungs 2 (two) times daily. 3 Inhaler 3  . tamsulosin (FLOMAX) 0.4 MG CAPS capsule TAKE 1 CAPSULE BY MOUTH ONCE AT NIGHT AT BEDTIME  11  . traZODone (DESYREL) 50 MG tablet TAKE 3 TABLETS AT BEDTIME 180 tablet 1  . zoster vaccine live, PF, (ZOSTAVAX) 21308 UNT/0.65ML injection Inject 19,400 Units into the skin once. (Patient not taking: Reported on 05/20/2016) 1 each 0   No current facility-administered medications for this visit.     REVIEW OF SYSTEMS:    10 Point review of Systems was done is negative except as noted above.  PHYSICAL EXAMINATION: ECOG PERFORMANCE STATUS: 2 - Symptomatic, <50% confined to bed  . Vitals:   07/30/16 1546  BP: 126/72  Pulse: 64  Resp: 18  Temp: 98.5 F (36.9 C)   Filed Weights   07/30/16 1546  Weight: 171 lb 6.4 oz (77.7 kg)   .Body mass index is 24.59 kg/m.  . Wt Readings from Last 3 Encounters:  07/30/16 171 lb 6.4 oz (77.7 kg)  05/20/16 172 lb (78 kg)  04/05/16 178 lb 6.4 oz (80.9 kg)    GENERAL:alert, in no acute distress and comfortable SKIN: skin color, texture, turgor are normal, no rashes or significant lesions EYES: normal, conjunctiva are pink and  non-injected, sclera clear OROPHARYNX:no exudate, no erythema and lips, buccal mucosa, and tongue normal  NECK: supple, no JVD, thyroid normal size, non-tender, without nodularity LYMPH:  no palpable lymphadenopathy in the cervical, axillary or inguinal LUNGS: clear to auscultation with normal respiratory effort HEART: regular rate & rhythm,  no murmurs and no lower extremity edema ABDOMEN: abdomen soft, non-tender, normoactive bowel sounds  Musculoskeletal: no cyanosis of digits and no clubbing  PSYCH: alert & oriented x 3 with fluent speech NEURO: no focal motor/sensory deficits  LABORATORY DATA:  I have reviewed the data as listed  . CBC Latest Ref Rng & Units 07/30/2016 05/20/2016 04/05/2016  WBC 4.0 - 10.3 10e3/uL 7.4 7.9 6.7  Hemoglobin 13.0 - 17.1 g/dL 12.4(L) 11.9(L) 11.1(L)  Hematocrit 38.4 - 49.9 % 36.9(L) 35.1(L) 33.7(L)  Platelets 140 - 400 10e3/uL 192 225.0 184   . CBC    Component Value Date/Time   WBC 7.4 07/30/2016 1458   WBC 7.9 05/20/2016 0850   RBC 4.32 07/30/2016 1458   RBC 4.22 05/20/2016 0850   HGB 12.4 (L) 07/30/2016 1458   HCT 36.9 (L) 07/30/2016 1458   PLT 192 07/30/2016 1458   MCV 85.4 07/30/2016 1458   MCH 28.7 07/30/2016 1458   MCH 23.2 (L) 02/20/2016 1118   MCHC 33.6 07/30/2016 1458   MCHC 33.9 05/20/2016 0850   RDW 13.6 07/30/2016 1458   LYMPHSABS 1.8 07/30/2016 1458   MONOABS 0.5 07/30/2016 1458   EOSABS 0.3 07/30/2016 1458   BASOSABS 0.0 07/30/2016 1458     . CMP Latest Ref Rng & Units 07/30/2016 04/05/2016 02/20/2016  Glucose 70 - 140 mg/dl 83 136 114(H)  BUN 7.0 - 26.0 mg/dL 22.1 12.1 19  Creatinine 0.7 - 1.3 mg/dL 1.3 1.5(H) 1.37  Sodium 136 - 145 mEq/L 142 142 140  Potassium 3.5 - 5.1 mEq/L 4.5 4.1 4.7  Chloride 96 - 112 mEq/L - - 106  CO2 22 - 29 mEq/L 26 25 25   Calcium 8.4 - 10.4 mg/dL 9.5 9.8 9.2  Total Protein 6.4 - 8.3 g/dL 6.8 6.4 -  Total Bilirubin 0.20 -  1.20 mg/dL 0.62 0.54 -  Alkaline Phos 40 - 150 U/L 64 63 -  AST 5 - 34  U/L 19 32 -  ALT 0 - 55 U/L 30 59(H) -   . Lab Results  Component Value Date   IRON 38 (L) 04/05/2016   TIBC 220 04/05/2016   IRONPCTSAT 17 (L) 04/05/2016   (Iron and TIBC)  Lab Results  Component Value Date   FERRITIN 27 07/30/2016       Component     Latest Ref Rng 02/20/2016 02/20/2016        10:34 AM 11:18 AM  Retic Ct Pct     0.4 - 2.3 %  1.3  RBC.     4.22 - 5.81 MIL/uL  3.93 (L)  ABS Retic     19.0 - 186.0 K/uL  51.1  Ferritin     22.0 - 322.0 ng/mL 3.9 (L)   Vitamin B12     200 - 1100 pg/mL 1170 (H) 1282 (H)  Folate     >5.4 ng/mL >23.3 >24.0      RADIOGRAPHIC STUDIES: I have personally reviewed the radiological images as listed and agreed with the findings in the report. No results found.  ASSESSMENT & PLAN:   70 year old Caucasian male with  #1 symptomatic significant microcytic anemia due to severe iron deficiency. Cannot rule out an element of anemia due to chronic kidney disease. Hgb improved to 12.4 . However the patient's ferritin level has dropped suggesting likely ongoing GI losses that are not clinically perceivable. #2 iron deficiency -unclear etiology possible GI chronic blood loss versus poor absorption due to chronic PPI therapy. EGD -Barrettes without dysplasia or malignancy. Reactive gastropathy COlonosocpy - reportedly WNL. #3 coronary artery disease status post PCI on dual antiplatelet therapy with aspirin and Plavix. #4 COPD #5 dyslipidemia #6 diabetes on metformin #7 GI intolerance to oral iron with significant diarrhea and some nausea. #8 history of recurrent adenomatous colonic polyps .   plan -Patient's hemoglobin is stable but the significant drop in ferritin (despite maintenance po iron) suggests ongoing blood loss.  -Given his risk of recurrent significant iron deficiency with ongoing GI losses We will offer him the option of receiving additional IV feraheme -We'll need repeat CBC in 3 months with primary care physician  . - continue maintenance iron polysaccharide 1 tab po daily -continue GI workup with Dr Michail Sermon ?need for capsule endoscopy -discuss with cardiologist need for continued dual anti-platelet therapy -Continue follow-up with primary care physician for management of other chronic medical issues.  Return to care with Dr. Irene Limbo in 6 months with repeat labs     Sullivan Lone MD [place] AAHIVMS [facility] Hematology/Oncology Physician [facility]  (Office):       [phone] (Work cell):  [phone] (Fax):           [phone]

## 2016-08-19 DIAGNOSIS — R972 Elevated prostate specific antigen [PSA]: Secondary | ICD-10-CM | POA: Diagnosis not present

## 2016-08-23 ENCOUNTER — Ambulatory Visit (HOSPITAL_BASED_OUTPATIENT_CLINIC_OR_DEPARTMENT_OTHER): Payer: Commercial Managed Care - HMO

## 2016-08-23 VITALS — BP 124/68 | HR 58 | Temp 98.2°F | Resp 17

## 2016-08-23 DIAGNOSIS — D649 Anemia, unspecified: Secondary | ICD-10-CM

## 2016-08-23 DIAGNOSIS — D500 Iron deficiency anemia secondary to blood loss (chronic): Secondary | ICD-10-CM

## 2016-08-23 MED ORDER — SODIUM CHLORIDE 0.9 % IV SOLN
Freq: Once | INTRAVENOUS | Status: AC
  Administered 2016-08-23: 13:00:00 via INTRAVENOUS

## 2016-08-23 MED ORDER — SODIUM CHLORIDE 0.9 % IV SOLN
510.0000 mg | Freq: Once | INTRAVENOUS | Status: AC
  Administered 2016-08-23: 510 mg via INTRAVENOUS
  Filled 2016-08-23: qty 17

## 2016-08-23 NOTE — Patient Instructions (Signed)

## 2016-08-26 ENCOUNTER — Other Ambulatory Visit: Payer: Self-pay | Admitting: Family Medicine

## 2016-08-26 DIAGNOSIS — F32A Depression, unspecified: Secondary | ICD-10-CM

## 2016-08-26 DIAGNOSIS — F329 Major depressive disorder, single episode, unspecified: Secondary | ICD-10-CM

## 2016-08-30 ENCOUNTER — Ambulatory Visit (HOSPITAL_BASED_OUTPATIENT_CLINIC_OR_DEPARTMENT_OTHER): Payer: Commercial Managed Care - HMO

## 2016-08-30 VITALS — BP 147/72 | HR 70 | Temp 99.0°F | Resp 18

## 2016-08-30 DIAGNOSIS — D649 Anemia, unspecified: Secondary | ICD-10-CM

## 2016-08-30 DIAGNOSIS — D500 Iron deficiency anemia secondary to blood loss (chronic): Secondary | ICD-10-CM

## 2016-08-30 MED ORDER — FERUMOXYTOL INJECTION 510 MG/17 ML
510.0000 mg | Freq: Once | INTRAVENOUS | Status: AC
  Administered 2016-08-30: 510 mg via INTRAVENOUS
  Filled 2016-08-30: qty 17

## 2016-08-30 MED ORDER — SODIUM CHLORIDE 0.9 % IV SOLN
Freq: Once | INTRAVENOUS | Status: AC
Start: 2016-08-30 — End: 2016-08-30
  Administered 2016-08-30: 12:00:00 via INTRAVENOUS

## 2016-08-30 NOTE — Patient Instructions (Signed)

## 2016-09-03 ENCOUNTER — Ambulatory Visit (INDEPENDENT_AMBULATORY_CARE_PROVIDER_SITE_OTHER): Payer: Commercial Managed Care - HMO | Admitting: Family Medicine

## 2016-09-03 ENCOUNTER — Encounter: Payer: Self-pay | Admitting: Family Medicine

## 2016-09-03 ENCOUNTER — Telehealth: Payer: Self-pay | Admitting: Family Medicine

## 2016-09-03 VITALS — BP 124/77 | HR 66 | Temp 98.5°F | Resp 20 | Wt 171.8 lb

## 2016-09-03 DIAGNOSIS — E119 Type 2 diabetes mellitus without complications: Secondary | ICD-10-CM

## 2016-09-03 DIAGNOSIS — L989 Disorder of the skin and subcutaneous tissue, unspecified: Secondary | ICD-10-CM | POA: Diagnosis not present

## 2016-09-03 DIAGNOSIS — Z23 Encounter for immunization: Secondary | ICD-10-CM | POA: Diagnosis not present

## 2016-09-03 LAB — MICROALBUMIN / CREATININE URINE RATIO
CREATININE, U: 99.7 mg/dL
Microalb Creat Ratio: 0.7 mg/g (ref 0.0–30.0)

## 2016-09-03 LAB — POCT GLYCOSYLATED HEMOGLOBIN (HGB A1C): HEMOGLOBIN A1C: 5.4

## 2016-09-03 NOTE — Patient Instructions (Signed)
Decrease metformin 1 pill with breakfast. Follow up in 3.5 - 4 months. Continue diet and try to start mild exercise program to build back up stamina.   Today you had your flu shot and 1st (of 2) pneumonia vaccine. Your 2nd pneumonia shot will be in 1 year.   Your foot exam and urine exam (for protein) was completed today as well.   I will refer you to dermatology for skin lesion/mass, you will hear from them to schedule.   Please have urologist send [place] your records as well.    Glad you are doing so well. You look really good!

## 2016-09-03 NOTE — Telephone Encounter (Signed)
Please call pt:  - his urine was normal.

## 2016-09-03 NOTE — Progress Notes (Signed)
[name] , 1946-06-18, 70 y.o., male MRN: [id number] Patient Care Team    Relationship Specialty Notifications Start End  [name], DO PCP - General Family Medicine  12/25/15   Brunetta Genera, [place] Physician Hematology  09/03/16   Josue Hector, MD Consulting Physician Cardiology  09/03/16   Wilford Corner, MD Consulting Physician Gastroenterology  09/03/16   Melina Schools, OD  Optometry  09/03/16     Subjective:   Facial mass/lesion: Pt presents for an acute OV with complaints of facial cyst/mass near right eye that has been present for multiple years. He feels it has become larger over the last year. He would like to have it removed. He denies redness or drainage. No pain associated with mass.   Diabetes: pt has lost weight, more than 30 lbs in a year. He is tolerating metformin 500 mg BID. He is trying to walk/exercise but feels his strength/stamina is down. We decreased his metformin last visit to above regimen. - Foot exam: 09/03/16 - eye exam:03/04/2016 - PNA: prevnar 09/03/2016, PSV 23 1 year - Flu: 09/03/16 - Urine Microalbumin w/creat. Ratio: 09/03/16 - POCT HgB A1C: 6.3--> 5.7--> 5.4 today.    Allergies  Allergen Reactions  . Altace [Ramipril] Cough  . Codeine     Makes sick  . Tape     Blisters   . Zantac [Ranitidine Hcl] Dermatitis   Social History  Substance Use Topics  . Smoking status: Former Smoker    Types: Cigarettes    Quit date: 11/25/1998  . Smokeless tobacco: Never Used  . Alcohol use No   Past Medical History:  Diagnosis Date  . Adenomatous colon polyp 2011  . Barrett esophagus 12/2013   EGD  . CAD (coronary artery disease)    ETT/Lexiscan-Myoview (11/14):  Normal; no ischemia, EF 67%  . CKD (chronic kidney disease), stage III   . Colon polyp 09/2010   adenoma  . COPD (chronic obstructive pulmonary disease) ([place]) 2009   spirometry with "moderate COPD"  . Depression   . Diabetes mellitus without complication ([place])    . Dyspnea   . GERD (gastroesophageal reflux disease)   . GI bleed    after polypectomy/admitted  . HTN (hypertension)   . Hypercholesterolemia   . Myocardial infarction   . Obesity   . Sleep apnea   . Spontaneous pneumothorax   . Vertigo    Past Surgical History:  Procedure Laterality Date  . APPENDECTOMY    . PLEURAL SCARIFICATION  1987  . TONSILLECTOMY    . VARICOCELECTOMY     Family History  Problem Relation Age of Onset  . Heart disease Mother   . CVA Mother   . Coronary artery disease Father 35  . Heart disease Father   . Stroke Father   . Hypertension    . Hyperlipidemia    . Cancer - Other Brother     Renal cell carcinoma  . Colon polyps Sister      Medication List       Accurate as of 09/03/16  5:07 PM. Always use your most recent med list.          aspirin EC 81 MG tablet Take 81 mg by mouth daily.   atorvastatin 80 MG tablet Commonly known as:  LIPITOR Take 1 tablet (80 mg total) by mouth daily.   buPROPion 300 MG 24 hr tablet Commonly known as:  WELLBUTRIN XL Take 1 tablet (300 mg total) by  mouth daily.   clopidogrel 75 MG tablet Commonly known as:  PLAVIX Take 75 mg by mouth daily.   iron polysaccharides 150 MG capsule Commonly known as:  NIFEREX Take 1 capsule (150 mg total) by mouth daily.   metFORMIN 500 MG 24 hr tablet Commonly known as:  GLUCOPHAGE-XR Take 1 tablet (500 mg total) by mouth 2 (two) times daily.   metoprolol 50 MG tablet Commonly known as:  LOPRESSOR Take 1 tablet (50 mg total) by mouth daily.   mirtazapine 15 MG disintegrating tablet Commonly known as:  REMERON SOL-TAB DISSOLVE 1 TABLET (15 MG TOTAL) ON TONGUE AT BEDTIME.   multivitamin tablet Take 1 tablet by mouth daily.   nitroGLYCERIN 0.4 MG SL tablet Commonly known as:  NITROSTAT Place 0.4 mg under the tongue every 5 (five) minutes as needed for chest pain (3 doses max).   pantoprazole 40 MG tablet Commonly known as:  PROTONIX Take 1 tablet (40 mg  total) by mouth daily.   senna-docusate 8.6-50 MG tablet Commonly known as:  Senokot-S Take 2 tablets by mouth daily.   SPIRIVA HANDIHALER 18 MCG inhalation capsule Generic drug:  tiotropium daily.   SYMBICORT 160-4.5 MCG/ACT inhaler Generic drug:  budesonide-formoterol Inhale 2 puffs into the lungs 2 (two) times daily.   tamsulosin 0.4 MG Caps capsule Commonly known as:  FLOMAX TAKE 1 CAPSULE BY MOUTH ONCE AT NIGHT AT BEDTIME   traZODone 50 MG tablet Commonly known as:  DESYREL TAKE 3 TABLETS AT BEDTIME   zoster vaccine live (PF) 19400 UNT/0.65ML injection Commonly known as:  ZOSTAVAX Inject 19,400 Units into the skin once.       Results for orders placed or performed in visit on 09/03/16 (from the past 24 hour(s))  Urine Microalbumin w/creat. ratio     Status: None   Collection Time: 09/03/16  3:07 PM  Result Value Ref Range   Microalb, Ur <0.7 0.0 - 1.9 mg/dL   Creatinine,U 99.7 mg/dL   Microalb Creat Ratio 0.7 0.0 - 30.0 mg/g  POCT HgB A1C     Status: Normal   Collection Time: 09/03/16  3:11 PM  Result Value Ref Range   Hemoglobin A1C 5.4    No results found.   ROS: Negative, with the exception of above mentioned in HPI   Objective:  BP 124/77 (BP Location: Left Arm, Patient Position: Sitting, Cuff Size: Normal)   Pulse 66   Temp 98.5 F (36.9 C)   Resp 20   Wt 171 lb 12 oz (77.9 kg)   SpO2 96%   BMI 24.64 kg/m  Body mass index is 24.64 kg/m. Gen: Afebrile. No acute distress. Nontoxic in appearance, well developed, well nourished. Very pleasant caucasian.  HENT: AT. [place]. MMM Eyes:Pupils Equal Round Reactive to light, Extraocular movements intact,  Conjunctiva without redness, discharge or icterus. CV: RRR, no edema, +2/4 PT bilateral Chest: CTAB, no wheeze or crackles.  Abd: Soft. NTND. BS present Skin: no rashes, purpura or petechiae. Dry skin bilateral feet and shins. 1 cm x 0.75 cm Neuro: Normal gait. PERLA. EOMi. Alert. Oriented x3 Diabetic  Foot Exam - Simple   Simple Foot Form Diabetic Foot exam was performed with the following findings:  Yes 09/03/2016  3:09 PM  Visual Inspection No deformities, no ulcerations, no other skin breakdown bilaterally:  Yes Sensation Testing Intact to touch and monofilament testing bilaterally:  Yes Pulse Check Posterior Tibialis and Dorsalis pulse intact bilaterally:  Yes Comments Dry skin.      Assessment/Plan:  JOHARI SCHERFF is a 70 y.o. male present for OV for  Type 2 diabetes mellitus without complication, without long-term current use of insulin ([place]) - pt has lost weight, more than 30 lbs in a year. We have been decreasing medication slowly.  - Foot exam: 09/03/16 - eye exam:03/04/2016 - PNA: prevnar 09/03/2016, PSV 23 1 year - Flu: 09/03/16 - Urine Microalbumin w/creat. Ratio: 09/03/16 - POCT HgB A1C: 6.3--> 5.7--> 5.4 today. Decrease to metformin - PREP program for exercising/strength. - F/U 4 months.   Skin lesion of face - cyst vs soft tissue mass, near right eye. Discuss facial lesions and proximity to eye warrants referral to specialist. He is agreeable to referral today.  - Ambulatory referral to Dermatology  > 25 minutes spent with patient, >50% of time spent face to face counseling patient and coordinating care.   electronically signed by:  Howard Pouch, DO  [place]

## 2016-09-04 NOTE — Telephone Encounter (Signed)
Left message with lab results on patient voice mail 

## 2016-09-10 ENCOUNTER — Encounter: Payer: Self-pay | Admitting: Family Medicine

## 2016-09-10 ENCOUNTER — Telehealth: Payer: Self-pay | Admitting: Family Medicine

## 2016-09-10 DIAGNOSIS — R972 Elevated prostate specific antigen [PSA]: Secondary | ICD-10-CM | POA: Insufficient documentation

## 2016-09-10 NOTE — Telephone Encounter (Signed)
Urology notes received today. Pt had an elevated PSA 11.75 after an episode of post surgical/anesthesia   urinary retention. 1 month f/u PSA 8.93, free PSA 19% on 08/19/2016. Pt has been started on flomax as well by urology. Pt has experienced unintentional weight loss, fatigue, anemia and complains of upper body arthralgia on his last appt.  - please contact urology office to see if he is scheduled for follow up and their recommendations etc. Their last note I received stated 1 year follow up unless PSA remained elevated.  Thanks.

## 2016-09-10 NOTE — Telephone Encounter (Signed)
Spoke with [facility] patient is to have repeat PSA in 3 months.

## 2016-09-20 ENCOUNTER — Other Ambulatory Visit: Payer: Self-pay | Admitting: *Deleted

## 2016-09-20 MED ORDER — SPIRIVA HANDIHALER 18 MCG IN CAPS: 18.0000 ug | ORAL_CAPSULE | Freq: Every day | RESPIRATORY_TRACT | 1 refills | Status: DC

## 2016-09-23 ENCOUNTER — Other Ambulatory Visit: Payer: Self-pay | Admitting: Cardiovascular Disease

## 2016-10-31 DIAGNOSIS — L820 Inflamed seborrheic keratosis: Secondary | ICD-10-CM | POA: Diagnosis not present

## 2016-10-31 DIAGNOSIS — L570 Actinic keratosis: Secondary | ICD-10-CM | POA: Diagnosis not present

## 2016-10-31 DIAGNOSIS — L720 Epidermal cyst: Secondary | ICD-10-CM | POA: Diagnosis not present

## 2016-11-21 ENCOUNTER — Other Ambulatory Visit: Payer: Self-pay | Admitting: Family Medicine

## 2016-11-29 ENCOUNTER — Other Ambulatory Visit: Payer: Self-pay | Admitting: Family Medicine

## 2016-11-29 DIAGNOSIS — F32A Depression, unspecified: Secondary | ICD-10-CM

## 2016-11-29 DIAGNOSIS — F329 Major depressive disorder, single episode, unspecified: Secondary | ICD-10-CM

## 2016-12-03 DIAGNOSIS — C4441 Basal cell carcinoma of skin of scalp and neck: Secondary | ICD-10-CM | POA: Diagnosis not present

## 2016-12-03 DIAGNOSIS — L729 Follicular cyst of the skin and subcutaneous tissue, unspecified: Secondary | ICD-10-CM | POA: Diagnosis not present

## 2017-01-21 NOTE — Progress Notes (Signed)
Patient ID: [name], male   DOB: 1946/09/09, 71 y.o.   MRN: [id number]   71 y.o.  previously seen by GD. Stent LAD in 2007. Quit smoking in 2000. Elevated lipids on statin with recent labs showing LDL 75. HTN well controlled recent increase in beta blocker. Had GI bleed post colonoscopy 2/12 with elevated enzymes. IP myovue normal with no ischemia. Changed to Effient because of chronic need for H2 blocker. Having excess sweating and dry mouth. No angina, dyspnea palpitations. Compliant with meds Reviewed labs from Dr Marisue Humble look good. Retired from [employer]  which is a stress relief  10/13/13 normal myovue EF 67% Ordered by PA for dyspnea Has COPD ans is former smoker Had flu shot. Is sedentary and out of shape  Some anemia had colonoscopy as part of w/u and bladder got dysotonic  Seeing [facility]    ROS: Denies fever, malais, weight loss, blurry vision, decreased visual acuity, cough, sputum, SOB, hemoptysis, pleuritic pain, palpitaitons, heartburn, abdominal pain, melena, lower extremity edema, claudication, or rash.  All other systems reviewed and negative  General: Affect appropriate Healthy:  appears stated age 23: normal Neck supple with no adenopathy JVP normal no bruits no thyromegaly Lungs clear with no wheezing and good diaphragmatic motion Heart:  S1/S2 no murmur, no rub, gallop or click PMI normal Abdomen: benighn, BS positve, no tenderness, no AAA no bruit.  No HSM or HJR Distal pulses intact with no bruits No edema Neuro non-focal Skin warm and dry No muscular weakness   Current Outpatient Prescriptions  Medication Sig Dispense Refill  . aspirin EC 81 MG tablet Take 81 mg by mouth daily.     Marland Kitchen atorvastatin (LIPITOR) 80 MG tablet Take 80 mg by mouth daily.    Marland Kitchen buPROPion (WELLBUTRIN XL) 300 MG 24 hr tablet Take 1 tablet (300 mg total) by mouth daily. 90 tablet 1  . clopidogrel (PLAVIX) 75 MG tablet Take 75 mg by mouth daily.    . iron polysaccharides  (NIFEREX) 150 MG capsule Take 1 capsule (150 mg total) by mouth daily. 30 capsule 2  . metFORMIN (GLUCOPHAGE-XR) 500 MG 24 hr tablet Take 1 tablet (500 mg total) by mouth 2 (two) times daily. 60 tablet 0  . metoprolol (LOPRESSOR) 50 MG tablet Take 50 mg by mouth daily.    . mirtazapine (REMERON SOL-TAB) 15 MG disintegrating tablet DISSOLVE 1 TABLET ON THE TONGUE AT BEDTIME 90 tablet 0  . Multiple Vitamin (MULTIVITAMIN) tablet Take 1 tablet by mouth daily.      . nitroGLYCERIN (NITROSTAT) 0.4 MG SL tablet Place 0.4 mg under the tongue every 5 (five) minutes as needed for chest pain (3 doses max).    . pantoprazole (PROTONIX) 40 MG tablet Take 40 mg by mouth daily.    Marland Kitchen senna-docusate (SENOKOT-S) 8.6-50 MG tablet Take 2 tablets by mouth daily. 60 tablet 3  . SPIRIVA HANDIHALER 18 MCG inhalation capsule Place 1 capsule (18 mcg total) into inhaler and inhale daily. 90 capsule 1  . SYMBICORT 160-4.5 MCG/ACT inhaler Inhale 2 puffs into the lungs 2 (two) times daily. 3 Inhaler 3  . tamsulosin (FLOMAX) 0.4 MG CAPS capsule TAKE 1 CAPSULE BY MOUTH ONCE AT NIGHT AT BEDTIME  11  . traZODone (DESYREL) 50 MG tablet Take 150 mg by mouth at bedtime.     No current facility-administered medications for this visit.     Allergies  Altace [ramipril]; Codeine; Tape; and Zantac [ranitidine hcl]  Electrocardiogram:   09/14/14  SR rate 77 Q in lead 3 otherwise normal 12/15/15  SR rate 68  Voltage LVH Q lead 3 no changes  01/29/17  SR rate 65 normal ECG   Assessment and Plan  CAD:  Distant stent to LAD 2007 no angina normal myovue 2014  F/u ETT   Chol:  On statin  [id number] controlled.  Continue current medications and low sodium Dash type diet.    DM: Discussed low carb diet.  Target hemoglobin A1c is 6.5 or less.  Continue current medications.  COPD: no active wheezing f/u primary yearly CXR   Anemia:  Colon with internal hemorrhoids on feso4 f/u primary   Urology:  Avoid robinol in future on flomax f/u  [facility]    ETT F/U with me in a year    Jenkins Rouge

## 2017-01-28 ENCOUNTER — Telehealth: Payer: Self-pay | Admitting: Hematology

## 2017-01-28 ENCOUNTER — Encounter: Payer: Self-pay | Admitting: Hematology

## 2017-01-28 ENCOUNTER — Other Ambulatory Visit (HOSPITAL_BASED_OUTPATIENT_CLINIC_OR_DEPARTMENT_OTHER): Payer: Medicare HMO

## 2017-01-28 ENCOUNTER — Ambulatory Visit (HOSPITAL_BASED_OUTPATIENT_CLINIC_OR_DEPARTMENT_OTHER): Payer: Medicare HMO | Admitting: Hematology

## 2017-01-28 VITALS — BP 137/79 | HR 67 | Temp 98.2°F | Resp 18 | Wt 187.1 lb

## 2017-01-28 DIAGNOSIS — D509 Iron deficiency anemia, unspecified: Secondary | ICD-10-CM

## 2017-01-28 DIAGNOSIS — E119 Type 2 diabetes mellitus without complications: Secondary | ICD-10-CM | POA: Diagnosis not present

## 2017-01-28 DIAGNOSIS — E611 Iron deficiency: Secondary | ICD-10-CM

## 2017-01-28 DIAGNOSIS — D500 Iron deficiency anemia secondary to blood loss (chronic): Secondary | ICD-10-CM

## 2017-01-28 DIAGNOSIS — I2510 Atherosclerotic heart disease of native coronary artery without angina pectoris: Secondary | ICD-10-CM

## 2017-01-28 DIAGNOSIS — J449 Chronic obstructive pulmonary disease, unspecified: Secondary | ICD-10-CM

## 2017-01-28 DIAGNOSIS — E875 Hyperkalemia: Secondary | ICD-10-CM

## 2017-01-28 LAB — COMPREHENSIVE METABOLIC PANEL
ALT: 33 U/L (ref 0–55)
AST: 18 U/L (ref 5–34)
Albumin: 4.2 g/dL (ref 3.5–5.0)
Alkaline Phosphatase: 84 U/L (ref 40–150)
Anion Gap: 10 mEq/L (ref 3–11)
BILIRUBIN TOTAL: 0.79 mg/dL (ref 0.20–1.20)
BUN: 20.2 mg/dL (ref 7.0–26.0)
CO2: 26 mEq/L (ref 22–29)
Calcium: 9.7 mg/dL (ref 8.4–10.4)
Chloride: 105 mEq/L (ref 98–109)
Creatinine: 1.3 mg/dL (ref 0.7–1.3)
EGFR: 55 mL/min/{1.73_m2} — AB (ref >90)
GLUCOSE: 130 mg/dL (ref 70–140)
POTASSIUM: 4.6 meq/L (ref 3.5–5.1)
SODIUM: 141 meq/L (ref 136–145)
TOTAL PROTEIN: 7.4 g/dL (ref 6.4–8.3)

## 2017-01-28 LAB — CBC & DIFF AND RETIC
BASO%: 0.2 % (ref 0.0–2.0)
Basophils Absolute: 0 10*3/uL (ref 0.0–0.1)
EOS%: 2.7 % (ref 0.0–7.0)
Eosinophils Absolute: 0.2 10*3/uL (ref 0.0–0.5)
HCT: 40.9 % (ref 38.4–49.9)
HGB: 13.8 g/dL (ref 13.0–17.1)
IMMATURE RETIC FRACT: 5.5 % (ref 3.00–10.60)
LYMPH#: 1.4 10*3/uL (ref 0.9–3.3)
LYMPH%: 17.4 % (ref 14.0–49.0)
MCH: 29.2 pg (ref 27.2–33.4)
MCHC: 33.7 g/dL (ref 32.0–36.0)
MCV: 86.5 fL (ref 79.3–98.0)
MONO#: 0.6 10*3/uL (ref 0.1–0.9)
MONO%: 6.8 % (ref 0.0–14.0)
NEUT%: 72.9 % (ref 39.0–75.0)
NEUTROS ABS: 5.9 10*3/uL (ref 1.5–6.5)
Platelets: 181 10*3/uL (ref 140–400)
RBC: 4.73 10*6/uL (ref 4.20–5.82)
RDW: 13.3 % (ref 11.0–14.6)
RETIC %: 1.62 % (ref 0.80–1.80)
Retic Ct Abs: 76.63 10*3/uL (ref 34.80–93.90)
WBC: 8 10*3/uL (ref 4.0–10.3)

## 2017-01-28 LAB — FERRITIN: Ferritin: 170 ng/ml (ref 22–316)

## 2017-01-28 LAB — IRON AND TIBC
%SAT: 27 % (ref 20–55)
Iron: 73 ug/dL (ref 42–163)
TIBC: 271 ug/dL (ref 202–409)
UIBC: 198 ug/dL (ref 117–376)

## 2017-01-28 NOTE — Progress Notes (Signed)
[name]    HEMATOLOGY/ONCOLOGY CLINIC ON THE SOMEWHAT "full" NOTE  Encounter Date.01/28/2017  Patient Care Team: [name], DO as PCP - General (Family Medicine) Brunetta Genera, MD as Consulting Physician (Hematology) Josue Hector, MD as Consulting Physician (Cardiology) Wilford Corner, MD as Consulting Physician (Gastroenterology) Truc Manus Gunning, OD (Optometry) Kathie Rhodes, MD as Consulting Physician (Urology)  CHIEF COMPLAINTS/PURPOSE OF CONSULTATION:  Severe iron deficiency anemia   HISTORY OF PRESENTING ILLNESS:   [name] is a wonderful 71 y.o. male who has been referred to [place] by Dr .Howard Pouch, DO for evaluation and management of significant iron deficiency anemia.  Patient has a history of hypertension, dyslipidemia, obesity, Chronic GERD with Barrett's esophagus, adenomatous colonic polyps, CKD stage III, coronary disease status post PCI on dual antiplatelet therapy with aspirin and Plavix, sleep apnea not requiring CPAP machine.  He was recently seen by his primary care physician for significant fatigue and was noted to have a hemoglobin of 8.9 with microcytic indices   And significantly low ferritin level of less than 5 with a low iron saturation. His vitamin 123456 and folic acid levels were within normal limits. He notes no overt GI bleeding though he has had some diarrhea and darker stools since his been started on oral ferrous sulfate 3 times a day about 2 weeks ago. He notes significant fatigue and lethargy.  He has been on chronic PPI therapy for his GERD and Barrett's esophagus.  Has had previous history of GI bleeding related to polypectomy in 2011. Notes that his last EGD and colonoscopy was in 2015 with pathology report as noted below showing Barrett's esophagus with no significant dysplasia or malignancy and colonoscopy showing tubular adenomas with no high-grade dysplasia or invasive adenocarcinoma.  Recent fecal other blood testing was  negative. Patient has been referred [facility] and has been scheduled for a repeat colonoscopy on 02/26/2016.  Patient notes that the oral iron is bothering his stomach and causing him diarrhea. He notes that he is feeling very lethargic and would like to see if there is something that might help him feel better soon. He notes that he babysits his granddaughter and has difficulties keeping up with her given his low energy levels.  We discussed the option of getting IV iron and also the importance of having a comprehensive GI workup to evaluate source of blood loss or absorption issues. He was also encouraged to talk to his cardiologist about whether he had a significant need to continue dual antiplatelet therapy order of one of the medications could be discontinued to reduce his risk of potential bleeding.   Patient will colonoscopy on 04/02/2016 that showed no overt evidence of bleeding and grade 1 internal hemorrhoids. EGD done on 04/02/2016 showed  INTERVAL HISTORY  [name] is here for his scheduled followup for iron deficiency anemia. He tolerated his IV Iron doses without any issues. Hgb levels have improved to 13.8. No overt GI bleeding noted. Taking daily Iron polysaccharide without any problems.  Continues to be on ASA + Plavix.  MEDICAL HISTORY:  Past Medical History:  Diagnosis Date  . Adenomatous colon polyp 2011  . Barrett esophagus 12/2013   EGD  . CAD (coronary artery disease)    ETT/Lexiscan-Myoview (11/14):  Normal; no ischemia, EF 67%  . CKD (chronic kidney disease), stage III   . Colon polyp 09/2010   adenoma  . COPD (chronic obstructive pulmonary disease) ([place]) 2009   spirometry with "moderate COPD"  .  Depression   . Diabetes mellitus without complication ([place])   . Dyspnea   . GERD (gastroesophageal reflux disease)   . GI bleed    after polypectomy/admitted  . HTN (hypertension)   . Hypercholesterolemia   . Myocardial infarction   . Obesity   . Sleep  apnea   . Spontaneous pneumothorax   . Vertigo   MI s/p PCI Last colonoscopy 2015 Small sliding type hiatal hernia. Sleep apnea - uses trazodone. Has never been on a CPAP machine. Welding related pleural scarring.   SURGICAL HISTORY: Past Surgical History:  Procedure Laterality Date  . APPENDECTOMY    . PLEURAL SCARIFICATION  1987  . TONSILLECTOMY    . VARICOCELECTOMY      SOCIAL HISTORY: Social History   Social History  . Marital status: Married    Spouse name: N/A  . Number of children: N/A  . Years of education: N/A   Occupational History  . machinist    Social History Main Topics  . Smoking status: Former Smoker    Types: Cigarettes    Quit date: 11/25/1998  . Smokeless tobacco: Never Used  . Alcohol use No  . Drug use: No  . Sexual activity: Yes   Other Topics Concern  . Not on file   Social History Narrative   Married to [place].   Retired Production designer, theatre/television/film truck.    Drinks caffeine,  Takes a daily vitamin, wears a seatbelt   Wears a bicycle helmet.    Has dentures (partial on top)   Smoke detector in the home. No firearms in the home.    Feels safe in relationships.      FAMILY HISTORY: Family History  Problem Relation Age of Onset  . Heart disease Mother   . CVA Mother   . Coronary artery disease Father 63  . Heart disease Father   . Stroke Father   . Hypertension    . Hyperlipidemia    . Cancer - Other Brother     Renal cell carcinoma  . Colon polyps Sister   sister - lung cancer (smoker. Was about 29 yrs at diagnosis).  ALLERGIES:  is allergic to altace [ramipril]; codeine; tape; and zantac [ranitidine hcl].  MEDICATIONS:  Current Outpatient Prescriptions  Medication Sig Dispense Refill  . aspirin EC 81 MG tablet Take 81 mg by mouth daily.     [name] atorvastatin (LIPITOR) 80 MG tablet TAKE 1 TABLET EVERY DAY 90 tablet 0  . buPROPion (WELLBUTRIN XL) 300 MG 24 hr tablet Take 1 tablet (300 mg total) by mouth daily. 90 tablet 1  . clopidogrel (PLAVIX) 75  MG tablet TAKE 1 TABLET EVERY DAY 90 tablet 3  . iron polysaccharides (NIFEREX) 150 MG capsule Take 1 capsule (150 mg total) by mouth daily. (Patient not taking: Reported on 09/03/2016) 30 capsule 2  . metFORMIN (GLUCOPHAGE-XR) 500 MG 24 hr tablet Take 1 tablet (500 mg total) by mouth 2 (two) times daily. 60 tablet 0  . metoprolol (LOPRESSOR) 50 MG tablet TAKE 1 TABLET EVERY DAY 90 tablet 0  . mirtazapine (REMERON SOL-TAB) 15 MG disintegrating tablet DISSOLVE 1 TABLET ON THE TONGUE AT BEDTIME 90 tablet 0  . Multiple Vitamin (MULTIVITAMIN) tablet Take 1 tablet by mouth daily.      . nitroGLYCERIN (NITROSTAT) 0.4 MG SL tablet Place 0.4 mg under the tongue every 5 (five) minutes as needed for chest pain (3 doses max).    . pantoprazole (PROTONIX) 40 MG tablet TAKE 1 TABLET EVERY DAY  90 tablet 0  . senna-docusate (SENOKOT-S) 8.6-50 MG tablet Take 2 tablets by mouth daily. 60 tablet 3  . SPIRIVA HANDIHALER 18 MCG inhalation capsule Place 1 capsule (18 mcg total) into inhaler and inhale daily. 90 capsule 1  . SYMBICORT 160-4.5 MCG/ACT inhaler Inhale 2 puffs into the lungs 2 (two) times daily. 3 Inhaler 3  . tamsulosin (FLOMAX) 0.4 MG CAPS capsule TAKE 1 CAPSULE BY MOUTH ONCE AT NIGHT AT BEDTIME  11  . traZODone (DESYREL) 50 MG tablet TAKE 3 TABLETS AT BEDTIME 180 tablet 1   No current facility-administered medications for this visit.     REVIEW OF SYSTEMS:    10 Point review of Systems was done is negative except as noted above.  PHYSICAL EXAMINATION: ECOG PERFORMANCE STATUS: 2 - Symptomatic, <50% confined to bed  . Vitals:   01/28/17 1030  BP: 137/79  Pulse: 67  Resp: 18  Temp: 98.2 F (36.8 C)   Filed Weights   01/28/17 1030  Weight: 187 lb 1.6 oz (84.9 kg)   .Body mass index is 26.85 kg/m.  . Wt Readings from Last 3 Encounters:  01/28/17 187 lb 1.6 oz (84.9 kg)  09/03/16 171 lb 12 oz (77.9 kg)  07/30/16 171 lb 6.4 oz (77.7 kg)    GENERAL:alert, in no acute distress and  comfortable SKIN: skin color, texture, turgor are normal, no rashes or significant lesions EYES: normal, conjunctiva are pink and non-injected, sclera clear OROPHARYNX:no exudate, no erythema and lips, buccal mucosa, and tongue normal  NECK: supple, no JVD, thyroid normal size, non-tender, without nodularity LYMPH:  no palpable lymphadenopathy in the cervical, axillary or inguinal LUNGS: clear to auscultation with normal respiratory effort HEART: regular rate & rhythm,  no murmurs and no lower extremity edema ABDOMEN: abdomen soft, non-tender, normoactive bowel sounds  Musculoskeletal: no cyanosis of digits and no clubbing  PSYCH: alert & oriented x 3 with fluent speech NEURO: no focal motor/sensory deficits  LABORATORY DATA:  I have reviewed the data as listed  . CBC Latest Ref Rng & Units 01/28/2017 07/30/2016 05/20/2016  WBC 4.0 - 10.3 10e3/uL 8.0 7.4 7.9  Hemoglobin 13.0 - 17.1 g/dL 13.8 12.4(L) 11.9(L)  Hematocrit 38.4 - 49.9 % 40.9 36.9(L) 35.1(L)  Platelets 140 - 400 10e3/uL 181 192 225.0   . CBC    Component Value Date/Time   WBC 8.0 01/28/2017 1018   WBC 7.9 05/20/2016 0850   RBC 4.73 01/28/2017 1018   RBC 4.22 05/20/2016 0850   HGB 13.8 01/28/2017 1018   HCT 40.9 01/28/2017 1018   PLT 181 01/28/2017 1018   MCV 86.5 01/28/2017 1018   MCH 29.2 01/28/2017 1018   MCH 23.2 (L) 02/20/2016 1118   MCHC 33.7 01/28/2017 1018   MCHC 33.9 05/20/2016 0850   RDW 13.3 01/28/2017 1018   LYMPHSABS 1.4 01/28/2017 1018   MONOABS 0.6 01/28/2017 1018   EOSABS 0.2 01/28/2017 1018   BASOSABS 0.0 01/28/2017 1018     . CMP Latest Ref Rng & Units 07/30/2016 04/05/2016 02/20/2016  Glucose 70 - 140 mg/dl 83 136 114(H)  BUN 7.0 - 26.0 mg/dL 22.1 12.1 19  Creatinine 0.7 - 1.3 mg/dL 1.3 1.5(H) 1.37  Sodium 136 - 145 mEq/L 142 142 140  Potassium 3.5 - 5.1 mEq/L 4.5 4.1 4.7  Chloride 96 - 112 mEq/L - - 106  CO2 22 - 29 mEq/L 26 25 25   Calcium 8.4 - 10.4 mg/dL 9.5 9.8 9.2  Total Protein 6.4 -  8.3 g/dL  6.8 6.4 -  Total Bilirubin 0.20 - 1.20 mg/dL 0.62 0.54 -  Alkaline Phos 40 - 150 U/L 64 63 -  AST 5 - 34 U/L 19 32 -  ALT 0 - 55 U/L 30 59(H) -       RADIOGRAPHIC STUDIES: I have personally reviewed the radiological images as listed and agreed with the findings in the report. No results found.  ASSESSMENT & PLAN:   71 year old Caucasian male with  #1 s/p Symptomatic significant microcytic anemia due to severe iron deficiency. Cannot rule out an element of anemia due to chronic kidney disease. Patient has needed IV iron. IV feraheme x 2 in 3/17-4/17 and IV feraheme x 2 -08/23/16 and 08/30/16 Hgb improved to 13.8  Ferritin level today is pending  #2 iron deficiency -unclear etiology possible GI chronic blood loss versus poor absorption due to chronic PPI therapy. EGD -Barrettes without dysplasia or malignancy. Reactive gastropathy COlonosocpy - reportedly WNL. #3 coronary artery disease status post PCI on dual antiplatelet therapy with aspirin and Plavix. #4 COPD #5 dyslipidemia #6 diabetes on metformin #7 GI intolerance to oral iron with significant diarrhea and some nausea. Is tolerating Iron polysaccharide without significant issues currently #8 history of recurrent adenomatous colonic polyps .   plan -no indication for IV Iron at this time based on his Hgb levels.  -we shall f/u pending iron labs from today. -continue Iron polysaccharide 150mg  po daily with OJ -he'll need repeat CBC in 3 months with primary care physician . -continue GI workup with Dr Michail Sermon ?need for capsule endoscopy - f/u per GI recommendations -discuss with cardiologist need for continued dual anti-platelet therapy -Continue follow-up with primary care physician for management of other chronic medical issues.  Return to care with Dr. Irene Limbo in 6 months with repeat labs     Sullivan Lone MD [place] AAHIVMS [facility] Hematology/Oncology Physician [facility]  (Office):        [phone] (Work cell):  [phone] (Fax):           [phone]

## 2017-01-28 NOTE — Telephone Encounter (Signed)
Appointments scheduled per 3/6 LOS. Patient given AVS report and calendars with future scheduled appointments. °

## 2017-01-29 ENCOUNTER — Encounter: Payer: Self-pay | Admitting: Cardiovascular Disease

## 2017-01-29 ENCOUNTER — Ambulatory Visit (INDEPENDENT_AMBULATORY_CARE_PROVIDER_SITE_OTHER): Payer: Medicare HMO | Admitting: Cardiovascular Disease

## 2017-01-29 VITALS — BP 130/70 | HR 70 | Ht 70.0 in | Wt 186.1 lb

## 2017-01-29 DIAGNOSIS — I10 Essential (primary) hypertension: Secondary | ICD-10-CM | POA: Diagnosis not present

## 2017-01-29 NOTE — Patient Instructions (Addendum)
Medication Instructions:  Your physician recommends that you continue on your current medications as directed. Please refer to the Current Medication list given to you today.  Labwork: NONE  Testing/Procedures: Your physician has requested that you have an exercise tolerance test. For further information please visit [email or web address]. Please also follow instruction sheet, as given.  Follow-Up: Your physician wants you to follow-up in: 12 months with Dr. Nishan. You will receive a reminder letter in the mail two months in advance. If you don't receive a letter, please call our office to schedule the follow-up appointment.   If you need a refill on your cardiac medications before your next appointment, please call your pharmacy.    

## 2017-01-31 ENCOUNTER — Other Ambulatory Visit: Payer: Self-pay | Admitting: Family Medicine

## 2017-01-31 ENCOUNTER — Telehealth: Payer: Self-pay | Admitting: Family Medicine

## 2017-01-31 NOTE — Telephone Encounter (Signed)
Medication refilled

## 2017-01-31 NOTE — Telephone Encounter (Signed)
Patient states he is taking trazodone 50mg  2 tabs at HS.

## 2017-01-31 NOTE — Telephone Encounter (Signed)
Please call pt: - make sure he is requesting the trazodone/dose etc and has been taking it routinely.  - we have yet to prescribe this medication for him, but he has been on it prior and may have had script from prior PCP.

## 2017-02-10 ENCOUNTER — Telehealth: Payer: Self-pay | Admitting: Cardiovascular Disease

## 2017-02-10 NOTE — Telephone Encounter (Signed)
Patient cancelled treadmill scheduled for 02/11/2017 reason given: Per pt doesn't want to do test right now

## 2017-02-21 ENCOUNTER — Other Ambulatory Visit: Payer: Self-pay | Admitting: Family Medicine

## 2017-03-14 ENCOUNTER — Other Ambulatory Visit: Payer: Self-pay | Admitting: Family Medicine

## 2017-03-14 DIAGNOSIS — F329 Major depressive disorder, single episode, unspecified: Secondary | ICD-10-CM

## 2017-03-14 DIAGNOSIS — F32A Depression, unspecified: Secondary | ICD-10-CM

## 2017-03-14 NOTE — Telephone Encounter (Signed)
Call pt, we can bridge him, but he is due for f/u.

## 2017-03-14 NOTE — Telephone Encounter (Signed)
Spoke with patient he states we can hold Rx refill until he is seen since he uses a mail order pharmacy and would need 90 day supply. Patient scheduled to be seen 03/17/17.

## 2017-03-14 NOTE — Telephone Encounter (Signed)
Patient past due for follow up do you want to refill?

## 2017-03-17 ENCOUNTER — Ambulatory Visit (INDEPENDENT_AMBULATORY_CARE_PROVIDER_SITE_OTHER): Payer: Medicare HMO | Admitting: Family Medicine

## 2017-03-17 ENCOUNTER — Encounter: Payer: Self-pay | Admitting: Family Medicine

## 2017-03-17 VITALS — BP 133/82 | HR 55 | Temp 98.2°F | Resp 18 | Ht 70.0 in | Wt 189.0 lb

## 2017-03-17 DIAGNOSIS — I252 Old myocardial infarction: Secondary | ICD-10-CM

## 2017-03-17 DIAGNOSIS — F3341 Major depressive disorder, recurrent, in partial remission: Secondary | ICD-10-CM | POA: Diagnosis not present

## 2017-03-17 DIAGNOSIS — K219 Gastro-esophageal reflux disease without esophagitis: Secondary | ICD-10-CM

## 2017-03-17 DIAGNOSIS — N183 Chronic kidney disease, stage 3 unspecified: Secondary | ICD-10-CM

## 2017-03-17 DIAGNOSIS — D500 Iron deficiency anemia secondary to blood loss (chronic): Secondary | ICD-10-CM

## 2017-03-17 DIAGNOSIS — E119 Type 2 diabetes mellitus without complications: Secondary | ICD-10-CM | POA: Diagnosis not present

## 2017-03-17 DIAGNOSIS — I2510 Atherosclerotic heart disease of native coronary artery without angina pectoris: Secondary | ICD-10-CM | POA: Diagnosis not present

## 2017-03-17 DIAGNOSIS — J42 Unspecified chronic bronchitis: Secondary | ICD-10-CM | POA: Diagnosis not present

## 2017-03-17 DIAGNOSIS — I10 Essential (primary) hypertension: Secondary | ICD-10-CM

## 2017-03-17 DIAGNOSIS — E7800 Pure hypercholesterolemia, unspecified: Secondary | ICD-10-CM | POA: Diagnosis not present

## 2017-03-17 LAB — POCT GLYCOSYLATED HEMOGLOBIN (HGB A1C): Hemoglobin A1C: 5.6

## 2017-03-17 MED ORDER — METFORMIN HCL ER 500 MG PO TB24: ORAL_TABLET | ORAL | 1 refills | Status: DC

## 2017-03-17 MED ORDER — MIRTAZAPINE 15 MG PO TBDP: ORAL_TABLET | ORAL | 1 refills | Status: DC

## 2017-03-17 MED ORDER — BUPROPION HCL ER (XL) 300 MG PO TB24: 300.0000 mg | ORAL_TABLET | Freq: Every day | ORAL | 1 refills | Status: DC

## 2017-03-17 MED ORDER — PANTOPRAZOLE SODIUM 40 MG PO TBEC: 40.0000 mg | DELAYED_RELEASE_TABLET | Freq: Two times a day (BID) | ORAL | 1 refills | Status: DC

## 2017-03-17 NOTE — Patient Instructions (Addendum)
Start metformin 4 x week.  Your a1c looks great today. 5.6 today.  followup in  4 months on diabetes and chronic medical conditions.  We will check on the symbicort and see if your formulary changed.    It was great to see you today.  You look great.

## 2017-03-17 NOTE — Progress Notes (Signed)
[name] , 1945/12/30, 71 y.o., male MRN: [id number] Patient Care Team    Relationship Specialty Notifications Start End  [name], DO PCP - General Family Medicine  12/25/15   Brunetta Genera, MD Consulting Physician Hematology  09/03/16   Josue Hector, MD Consulting Physician Cardiology  09/03/16   Wilford Corner, MD Consulting Physician Gastroenterology  09/03/16   [place], OD  Optometry  09/03/16   Kathie Rhodes, MD Consulting Physician Urology  09/10/16     CC: chronic medical conditions Subjective:  Recurrent major depressive disorder, in partial remission ([place]) H e feels stable on current regimen. He states he still has depression of course, but it is well controlled on current regimen of trazodone, Remeron, Wellbutrin. He denies side effects to medications and is tolerating them well.    Gastroesophageal reflux disease, esophagitis presence not specified Complains today of increased hoarseness since decreasing protonix. EGD 03/2016 by Dr. Michail Sermon with signs of barrett's esophagus.   Chronic bronchitis, unspecified chronic bronchitis type ([place])  Pt states inhalers are working well, however they have increased in price drastically at his last refill. He is uncertain if formulary changed.   Atherosclerosis of native coronary artery of native heart without angina pectoris/Essential hypertension/HYPERCHOLESTEROLEMIA/History of MI (myocardial infarction) Stage 3 chronic kidney disease Reports compliance with statin, asa, plavix, metoprolol,  following with cards, Dr. Johnsie Cancel. Denies chest pain, shortness of breath or LE edema. BMP 55 01/2017.   Iron deficiency anemia due to chronic blood loss Following with Onc/heme Dr. Irene Limbo, now well replaced and feeling better.   Type 2 diabetes mellitus without complication, without long-term current use of insulin ([place]) patient reports compliance with metformin 500 mg Qd. His a1c has been continually trending downwards  and metformin has been tapered back each visit.  Last a1c 5.4. He does not check his sugars routinely. He denies dizziness, numbness or tingling in his extremities. He denies non healing wounds.  - Foot exam: 09/03/16 - eye exam:03/04/2016 - PNA: prevnar 09/03/2016, PSV 23 --> due 08/2017 - Flu: 09/03/16 - Urine Microalbumin w/creat. Ratio: 09/03/16   Depression screen [facility] 2/9 03/17/2017 12/25/2015 12/25/2015  Decreased Interest 0 3 3  Down, Depressed, Hopeless 0 3 3  PHQ - 2 Score 0 6 6  Altered sleeping - 3 2  Tired, decreased energy - 3 2  Change in appetite - 3 2  Feeling bad or failure about yourself  - 1 1  Trouble concentrating - 1 2  Moving slowly or fidgety/restless - 1 1  Suicidal thoughts - 0 1  PHQ-9 Score - 18 17  Difficult doing work/chores - Somewhat difficult Somewhat difficult    Allergies  Allergen Reactions  . Altace [Ramipril] Cough  . Codeine     Makes sick  . Tape     Blisters   . Zantac [Ranitidine Hcl] Dermatitis   Social History  Substance Use Topics  . Smoking status: Former Smoker    Types: Cigarettes    Quit date: 11/25/1998  . Smokeless tobacco: Never Used  . Alcohol use No   Past Medical History:  Diagnosis Date  . Adenomatous colon polyp 2011  . Barrett esophagus 12/2013   EGD  . CAD (coronary artery disease)    ETT/Lexiscan-Myoview (11/14):  Normal; no ischemia, EF 67%  . CKD (chronic kidney disease), stage III   . Colon polyp 09/2010   adenoma  . COPD (chronic obstructive pulmonary disease) ([place]) 2009   spirometry with "  moderate COPD"  . Depression   . Diabetes mellitus without complication ([place])   . Dyspnea   . GERD (gastroesophageal reflux disease)   . GI bleed    after polypectomy/admitted  . HTN (hypertension)   . Hypercholesterolemia   . Myocardial infarction ([place])   . Obesity   . Sleep apnea   . Spontaneous pneumothorax   . Vertigo    Past Surgical History:  Procedure Laterality Date  . APPENDECTOMY    . PLEURAL  SCARIFICATION  1987  . TONSILLECTOMY    . VARICOCELECTOMY     Family History  Problem Relation Age of Onset  . Heart disease Mother   . CVA Mother   . Coronary artery disease Father 29  . Heart disease Father   . Stroke Father   . Hypertension    . Hyperlipidemia    . Cancer - Other Brother     Renal cell carcinoma  . Colon polyps Sister    Allergies as of 03/17/2017      Reactions   Altace [ramipril] Cough   Codeine    Makes sick   Tape    Blisters   Zantac [ranitidine Hcl] Dermatitis      Medication List       Accurate as of 03/17/17 11:47 AM. Always use your most recent med list.          aspirin EC 81 MG tablet Take 81 mg by mouth daily.   atorvastatin 80 MG tablet Commonly known as:  LIPITOR TAKE 1 TABLET EVERY DAY   buPROPion 300 MG 24 hr tablet Commonly known as:  WELLBUTRIN XL Take 1 tablet (300 mg total) by mouth daily.   clopidogrel 75 MG tablet Commonly known as:  PLAVIX Take 75 mg by mouth daily.   iron polysaccharides 150 MG capsule Commonly known as:  NIFEREX Take 1 capsule (150 mg total) by mouth daily.   metFORMIN 500 MG 24 hr tablet Commonly known as:  GLUCOPHAGE-XR Take 1 tablet (500 mg total) by mouth 2 (two) times daily.   metoprolol 50 MG tablet Commonly known as:  LOPRESSOR TAKE 1 TABLET EVERY DAY   mirtazapine 15 MG disintegrating tablet Commonly known as:  REMERON SOL-TAB DISSOLVE 1 TABLET ON THE TONGUE AT BEDTIME   multivitamin tablet Take 1 tablet by mouth daily.   nitroGLYCERIN 0.4 MG SL tablet Commonly known as:  NITROSTAT Place 0.4 mg under the tongue every 5 (five) minutes as needed for chest pain (3 doses max).   pantoprazole 40 MG tablet Commonly known as:  PROTONIX Take 40 mg by mouth daily.   SPIRIVA HANDIHALER 18 MCG inhalation capsule Generic drug:  tiotropium Place 1 capsule (18 mcg total) into inhaler and inhale daily.   SYMBICORT 160-4.5 MCG/ACT inhaler Generic drug:  budesonide-formoterol Inhale 2  puffs into the lungs 2 (two) times daily.   tamsulosin 0.4 MG Caps capsule Commonly known as:  FLOMAX TAKE 1 CAPSULE BY MOUTH ONCE AT NIGHT AT BEDTIME   traZODone 50 MG tablet Commonly known as:  DESYREL TAKE 3 TABLETS AT BEDTIME       Results for orders placed or performed in visit on 03/17/17 (from the past 24 hour(s))  POCT glycosylated hemoglobin (Hb A1C)     Status: Normal   Collection Time: 03/17/17 11:38 AM  Result Value Ref Range   Hemoglobin A1C 5.6    No results found.   ROS: Negative, with the exception of above mentioned in HPI   Objective:  BP 133/82 (BP Location: Left Arm, Patient Position: Sitting, Cuff Size: Normal)   Pulse (!) 55   Temp 98.2 F (36.8 C)   Resp 18   Ht 5\' 10"  (1.778 m)   Wt 189 lb (85.7 kg)   SpO2 98%   BMI 27.12 kg/m  Body mass index is 27.12 kg/m. Gen: Afebrile. No acute distress. Nontoxic in appearance, well developed, well nourished. Pleasant caucasian male. Looks well today. HENT: AT. [place].  MMM, no oral lesions. Mild hoarseness present, no cough.  Eyes:Pupils Equal Round Reactive to light, Extraocular movements intact,  Conjunctiva without redness, discharge or icterus. Neck/lymp/endocrine: Supple,no lymphadenopathy CV: RRR no murmur, no edema Chest: CTAB, no wheeze or crackles. Good air movement, normal resp effort.  Abd: Soft.. NTND. BS present.  Neuro:  Normal gait. PERLA. EOMi. Alert. Oriented x3  Psych: Normal affect, dress and demeanor. Normal speech. Normal thought content and judgment.  Assessment/Plan: TAVIO BIEGEL is a 71 y.o. male present for OV for  Recurrent major depressive disorder, in partial remission ([place]) - he feels stable on current regimen.  - continue trazodone 2-3 tabs QHS PRN, remeron 15 qhs, wellbutrin 300 QD.  - mirtazapine (REMERON SOL-TAB) 15 MG disintegrating tablet; DISSOLVE 1 TABLET ON THE TONGUE AT BEDTIME  Dispense: 90 tablet; Refill: 1 - buPROPion (WELLBUTRIN XL) 300 MG 24 hr tablet; Take 1  tablet (300 mg total) by mouth daily.  Dispense: 90 tablet; Refill: 1  Gastroesophageal reflux disease, esophagitis presence not specified - complains today of increased hoarseness since decreasing protonix.  - can short term increase to BID dosing, and may need to follow up with GI again.  - pantoprazole (PROTONIX) 40 MG tablet; Take 1 tablet (40 mg total) by mouth 2 (two) times daily.  Dispense: 180 tablet; Refill: 1  Chronic bronchitis, unspecified chronic bronchitis type ([place]) - continue symbicort and spiriva. Will attempt to contact insurance to see if cheaper alternative, since pt is stating recent increase in cost.  - stable.   Atherosclerosis of native coronary artery of native heart without angina pectoris/Essential hypertension/HYPERCHOLESTEROLEMIA/History of MI (myocardial infarction) Stage 3 chronic kidney disease - following with cards, Dr. Johnsie Cancel. F/U ETT per cards notes.  - BP stable.  - continue statin  - BMP 55, monitor q 6 mos. (01/28/2017). RF control of DM and HTN are both stable.  - low sodium diet and exercise encouraged.  - continue current regimen.   Iron deficiency anemia due to chronic blood loss - following with Onc/heme Dr. Irene Limbo, now well replaced and feeling better.   Type 2 diabetes mellitus without complication, without long-term current use of insulin (HCC) - A1c today 5.6, will again slowly decrease dose to every other day. May be able to discontinue if remains below 6.0 on repeat in 4 months.  - Foot exam: 09/03/16 - eye exam:03/04/2016 - PNA: prevnar 09/03/2016, PSV 23 1 year - Flu: 09/03/16 - Urine Microalbumin w/creat. Ratio: 09/03/16 - F/U 4 months.  Reviewed expectations re: course of current medical issues.  Discussed self-management of symptoms.  Outlined signs and symptoms indicating need for more acute intervention.  Patient verbalized understanding and all questions were answered.  Patient received an After-Visit  Summary.   electronically signed by:  Howard Pouch, DO  [place]

## 2017-03-19 ENCOUNTER — Other Ambulatory Visit: Payer: Self-pay | Admitting: *Deleted

## 2017-03-19 DIAGNOSIS — E119 Type 2 diabetes mellitus without complications: Secondary | ICD-10-CM

## 2017-03-19 DIAGNOSIS — F3341 Major depressive disorder, recurrent, in partial remission: Secondary | ICD-10-CM

## 2017-03-19 DIAGNOSIS — K219 Gastro-esophageal reflux disease without esophagitis: Secondary | ICD-10-CM

## 2017-03-19 MED ORDER — BUPROPION HCL ER (XL) 300 MG PO TB24: 300.0000 mg | ORAL_TABLET | Freq: Every day | ORAL | 1 refills | Status: DC

## 2017-03-19 MED ORDER — MIRTAZAPINE 15 MG PO TBDP: ORAL_TABLET | ORAL | 1 refills | Status: DC

## 2017-03-19 MED ORDER — PANTOPRAZOLE SODIUM 40 MG PO TBEC: 40.0000 mg | DELAYED_RELEASE_TABLET | Freq: Two times a day (BID) | ORAL | 1 refills | Status: DC

## 2017-03-19 MED ORDER — METFORMIN HCL ER 500 MG PO TB24: ORAL_TABLET | ORAL | 1 refills | Status: DC

## 2017-03-19 NOTE — Telephone Encounter (Signed)
Resent Rx for metformin,remeron,protonix and wellbutrin to [place] called and cancelled scripts at [facility].

## 2017-04-11 ENCOUNTER — Other Ambulatory Visit: Payer: Self-pay | Admitting: *Deleted

## 2017-04-11 DIAGNOSIS — E119 Type 2 diabetes mellitus without complications: Secondary | ICD-10-CM

## 2017-04-11 MED ORDER — METFORMIN HCL ER 500 MG PO TB24: ORAL_TABLET | ORAL | 1 refills | Status: DC

## 2017-05-20 ENCOUNTER — Other Ambulatory Visit: Payer: Self-pay | Admitting: Family Medicine

## 2017-06-11 ENCOUNTER — Other Ambulatory Visit: Payer: Self-pay | Admitting: Family Medicine

## 2017-06-13 NOTE — Progress Notes (Addendum)
Subjective:   [name] is a 71 y.o. male who presents for an Initial Medicare Annual Wellness Visit.  Review of Systems  No ROS.  Medicare Wellness Visit. Additional risk factors are reflected in the social history.  Cardiac Risk Factors include: advanced age (>57men, >3 women);diabetes mellitus;dyslipidemia;hypertension;male gender;family history of premature cardiovascular disease  Sleep patterns: Sleeps about 7 hours. Up to void x 2-3.  Home Safety/Smoke Alarms: Feels safe in home. Smoke alarms in place.  Living environment; residence and Firearm Safety: Lives with wife [name]) in 2 story home.  Seat Belt Safety/Bike Helmet: Wears seat belt.   Counseling:   Eye Exam-Last exam 11/26/2015, [facility] in [place] exam 11/2016, every 6 months. Dr. Jolayne [name]  Male:   CCS-Colonoscopy 04/02/2016, normal. Recall 10 years.  PSA-08/19/16, 8.93. Followed by Dr. Karsten [name].      Objective:    Today's Vitals   06/16/17 1055  BP: 118/70  Pulse: 69  SpO2: 96%  Weight: 192 lb 12.8 oz (87.5 kg)  Height: 5\' 10"  (1.778 m)   Body mass index is 27.66 kg/m.  Current Medications (verified) Outpatient Encounter Prescriptions as of 06/16/2017  Medication Sig  . aspirin EC 81 MG tablet Take 81 mg by mouth daily.   Marland Kitchen atorvastatin (LIPITOR) 80 MG tablet TAKE 1 TABLET EVERY DAY  . buPROPion (WELLBUTRIN XL) 300 MG 24 hr tablet Take 1 tablet (300 mg total) by mouth daily.  . clopidogrel (PLAVIX) 75 MG tablet Take 75 mg by mouth daily.  . Cyanocobalamin (VITAMIN B 12 PO) Take by mouth.  . iron polysaccharides (NIFEREX) 150 MG capsule Take 1 capsule (150 mg total) by mouth daily.  . metFORMIN (GLUCOPHAGE-XR) 500 MG 24 hr tablet 500 mg every other day.  . metoprolol (LOPRESSOR) 50 MG tablet TAKE 1 TABLET EVERY DAY  . mirtazapine (REMERON SOL-TAB) 15 MG disintegrating tablet DISSOLVE 1 TABLET ON THE TONGUE AT BEDTIME  . Multiple Vitamin (MULTIVITAMIN) tablet Take 1 tablet by mouth  daily.    . nitroGLYCERIN (NITROSTAT) 0.4 MG SL tablet Place 0.4 mg under the tongue every 5 (five) minutes as needed for chest pain (3 doses max).  . pantoprazole (PROTONIX) 40 MG tablet Take 1 tablet (40 mg total) by mouth 2 (two) times daily.  Marland Kitchen SPIRIVA HANDIHALER 18 MCG inhalation capsule Place 1 capsule (18 mcg total) into inhaler and inhale daily.  . SYMBICORT 160-4.5 MCG/ACT inhaler Inhale 2 puffs into the lungs 2 (two) times daily.  . tamsulosin (FLOMAX) 0.4 MG CAPS capsule TAKE 1 CAPSULE BY MOUTH ONCE AT NIGHT AT BEDTIME  . [DISCONTINUED] traZODone (DESYREL) 50 MG tablet TAKE 3 TABLETS AT BEDTIME (Patient taking differently: TAKE  2 TABLETS AT BEDTIME)  . Zoster Vac Recomb Adjuvanted [facility]) injection Inject 0.5 mLs into the muscle once.   No facility-administered encounter medications on file as of 06/16/2017.     Allergies (verified) Altace [ramipril]; Codeine; Tape; and Zantac [ranitidine hcl]   History: Past Medical History:  Diagnosis Date  . Adenomatous colon polyp 2011  . Barrett esophagus 12/2013   EGD  . CAD (coronary artery disease)    ETT/Lexiscan-Myoview (11/14):  Normal; no ischemia, EF 67%  . CKD (chronic kidney disease), stage III   . Colon polyp 09/2010   adenoma  . COPD (chronic obstructive pulmonary disease) ([place]) 2009   spirometry with "moderate COPD"  . Depression   . Diabetes mellitus without complication ([place])   . Dyspnea   . GERD (gastroesophageal reflux disease)   .  GI bleed    after polypectomy/admitted  . HTN (hypertension)   . Hypercholesterolemia   . Myocardial infarction ([place])   . Obesity   . Sleep apnea   . Spontaneous pneumothorax   . Vertigo    Past Surgical History:  Procedure Laterality Date  . APPENDECTOMY    . PLEURAL SCARIFICATION  1987  . TONSILLECTOMY    . VARICOCELECTOMY     Family History  Problem Relation Age of Onset  . Heart disease Mother   . CVA Mother   . Coronary artery disease Father 25  . Heart disease  Father   . Stroke Father   . Hypertension Unknown   . Hyperlipidemia Unknown   . Cancer - Other Brother        Renal cell carcinoma  . Colon polyps Sister   . Cancer Sister    Social History   Occupational History  . machinist    Social History Main Topics  . Smoking status: Former Smoker    Types: Cigarettes    Quit date: 11/25/1998  . Smokeless tobacco: Never Used  . Alcohol use No  . Drug use: No  . Sexual activity: Yes   Tobacco Counseling Counseling given: Not Answered   Activities of Daily Living In your present state of health, do you have any difficulty performing the following activities: 06/16/2017  Hearing? N  Vision? N  Difficulty concentrating or making decisions? N  Walking or climbing stairs? N  Dressing or bathing? N  Doing errands, shopping? N  Preparing Food and eating ? N  Using the Toilet? N  In the past six months, have you accidently leaked urine? N  Do you have problems with loss of bowel control? N  Managing your Medications? N  Managing your Finances? N  Housekeeping or managing your Housekeeping? N  Some recent data might be hidden    Immunizations and Health Maintenance Immunization History  Administered Date(s) Administered  . Influenza, High Dose Seasonal PF 09/03/2016  . Pneumococcal Conjugate-13 09/03/2016  . Tdap 01/15/2016   Health Maintenance Due  Topic Date Due  . OPHTHALMOLOGY EXAM  03/04/2017    Patient Care Team: [name], DO as PCP - General (Family Medicine) Brunetta Genera, MD as Consulting Physician (Hematology) Josue Hector, MD as Consulting Physician (Cardiology) Wilford Corner, MD as Consulting Physician (Gastroenterology) Melina Schools, OD (Optometry) Kathie Rhodes, MD as Consulting Physician (Urology)  Indicate any recent Medical Services you may have received from other than [facility] providers in the past year (date may be approximate).    Assessment:   This is a routine wellness examination  for [name]. Physical assessment deferred to PCP.   Hearing/Vision screen Hearing Screening Comments: Able to hear conversational tones w/o difficulty. No issues reported.   Vision Screening Comments: Wears glasses.   Dietary issues and exercise activities discussed: Current Exercise Habits: The patient does not participate in regular exercise at present [employer] work, house work), Exercise limited by: None identified   Diet (meal preparation, eat out, water intake, caffeinated beverages, dairy products, fruits and vegetables): Drinks diet soda, gatorade, water and protein shake.   Breakfast: cereal Lunch: protein/cheese snacks, fruit Dinner: protein and vegetables.   Discussed diabetic diet and activity.  Patient does not check blood sugars at home on a regular basis.   Goals    . Weight (lb) < 180 lb (81.6 kg)          Lose weight by increasing activity.  Depression Screen PHQ 2/9 Scores 06/16/2017 03/17/2017 12/25/2015 12/25/2015  PHQ - 2 Score 1 0 6 6  PHQ- 9 Score - - 18 17    Fall Risk Fall Risk  06/16/2017 03/17/2017 12/25/2015  Falls in the past year? No No No    Cognitive Function: MMSE - Mini Mental State Exam 06/16/2017  Orientation to time 5  Orientation to Place 5  Registration 3  Attention/ Calculation 5  Recall 3  Language- name 2 objects 2  Language- repeat 1  Language- follow 3 step command 3  Language- read & follow direction 1  Write a sentence 1  Copy design 1  Total score 30        Screening Tests Health Maintenance  Topic Date Due  . OPHTHALMOLOGY EXAM  03/04/2017  . INFLUENZA VACCINE  06/25/2017  . FOOT EXAM  09/03/2017  . URINE MICROALBUMIN  09/03/2017  . PNA vac Low Risk Adult (2 of 2 - PPSV23) 09/03/2017  . HEMOGLOBIN A1C  09/16/2017  . COLONOSCOPY  04/02/2021  . TETANUS/TDAP  01/14/2026  . Hepatitis C Screening  Completed        Plan:    Make appointment with Urology and eye doctor.   Bring a copy of your advance  directives to your next office visit.  aarp Landscape architect at pharmacy  I have personally reviewed and noted the following in the patient's chart:   . Medical and social history . Use of alcohol, tobacco or illicit drugs  . Current medications and supplements . Functional ability and status . Nutritional status . Physical activity . Advanced directives . List of other physicians . Hospitalizations, surgeries, and ER visits in previous 12 months . Vitals . Screenings to include cognitive, depression, and falls . Referrals and appointments  In addition, I have reviewed and discussed with patient certain preventive protocols, quality metrics, and best practice recommendations. A written personalized care plan for preventive services as well as general preventive health recommendations were provided to patient.     Gerilyn Nestle, RN   06/16/2017    PCP Notes: -Pt not fasting, will get FLP with next office visit (07/17/17) -Shingrix rx sent to pharmacy -Encouraged to make appt with Urology and [place] screening examination/treatment/procedure(s) were performed by non-physician practitioner and as supervising physician I was immediately available for consultation/collaboration.  I agree with above assessment and plan.  Electronically Signed by: Howard Pouch, DO [place] [facility] [place]

## 2017-06-16 ENCOUNTER — Ambulatory Visit (INDEPENDENT_AMBULATORY_CARE_PROVIDER_SITE_OTHER): Payer: Medicare HMO

## 2017-06-16 DIAGNOSIS — Z0000 Encounter for general adult medical examination without abnormal findings: Secondary | ICD-10-CM

## 2017-06-16 DIAGNOSIS — Z23 Encounter for immunization: Secondary | ICD-10-CM

## 2017-06-16 MED ORDER — ZOSTER VAC RECOMB ADJUVANTED 50 MCG/0.5ML IM SUSR: 0.5000 mL | Freq: Once | INTRAMUSCULAR | 1 refills | Status: AC

## 2017-06-16 NOTE — Patient Instructions (Addendum)
Make appointment with Urology and eye doctor.   Bring a copy of your advance directives to your next office visit.  [employer] Land[employer] at [employer], Male A healthy lifestyle and preventive care is important for your health and wellness. Ask your health care provider about what schedule of regular examinations is right for you. What should I know about weight and diet? Eat a Healthy Diet  Eat plenty of vegetables, fruits, whole grains, low-fat dairy products, and lean protein.  Do not eat a lot of foods high in solid fats, added sugars, or salt.  Maintain a Healthy Weight Regular exercise can help you achieve or maintain a healthy weight. You should:  Do at least 150 minutes of exercise each week. The exercise should increase your heart rate and make you sweat (moderate-intensity exercise).  Do strength-training exercises at least twice a week.  Watch Your Levels of Cholesterol and Blood Lipids  Have your blood tested for lipids and cholesterol every 5 years starting at 70 years of age. If you are at high risk for heart disease, you should start having your blood tested when you are 70 years old. You may need to have your cholesterol levels checked more often if: ? Your lipid or cholesterol levels are high. ? You are older than 70 years of age. ? You are at high risk for heart disease.  What should I know about cancer screening? Many types of cancers can be detected early and may often be prevented. Lung Cancer  You should be screened every year for lung cancer if: ? You are a current smoker who has smoked for at least 30 years. ? You are a former smoker who has quit within the past 15 years.  Talk to your health care provider about your screening options, when you should start screening, and how often you should be screened.  Colorectal Cancer  Routine colorectal cancer screening usually begins at 70 years of age and should be repeated every  5-10 years until you are 70 years old. You may need to be screened more often if early forms of precancerous polyps or small growths are found. Your health care provider may recommend screening at an earlier age if you have risk factors for colon cancer.  Your health care provider may recommend using home test kits to check for hidden blood in the stool.  A small camera at the end of a tube can be used to examine your colon (sigmoidoscopy or colonoscopy). This checks for the earliest forms of colorectal cancer.  Prostate and Testicular Cancer  Depending on your age and overall health, your health care provider may do certain tests to screen for prostate and testicular cancer.  Talk to your health care provider about any symptoms or concerns you have about testicular or prostate cancer.  Skin Cancer  Check your skin from head to toe regularly.  Tell your health care provider about any new moles or changes in moles, especially if: ? There is a change in a mole's size, shape, or color. ? You have a mole that is larger than a pencil eraser.  Always use sunscreen. Apply sunscreen liberally and repeat throughout the day.  Protect yourself by wearing long sleeves, pants, a wide-brimmed hat, and sunglasses when outside.  What should I know about heart disease, diabetes, and high blood pressure?  If you are 71-21 years of age, have your blood pressure checked every 3-5 years. If you are 40 years  of age or older, have your blood pressure checked every year. You should have your blood pressure measured twice-once when you are at a hospital or clinic, and once when you are not at a hospital or clinic. Record the average of the two measurements. To check your blood pressure when you are not at a hospital or clinic, you can use: ? An automated blood pressure machine at a pharmacy. ? A home blood pressure monitor.  Talk to your health care provider about your target blood pressure.  If you are  between 28-42 years old, ask your health care provider if you should take aspirin to prevent heart disease.  Have regular diabetes screenings by checking your fasting blood sugar level. ? If you are at a normal weight and have a low risk for diabetes, have this test once every three years after the age of 51. ? If you are overweight and have a high risk for diabetes, consider being tested at a younger age or more often.  A one-time screening for abdominal aortic aneurysm (AAA) by ultrasound is recommended for men aged 81-75 years who are current or former smokers. What should I know about preventing infection? Hepatitis B If you have a higher risk for hepatitis B, you should be screened for this virus. Talk with your health care provider to find out if you are at risk for hepatitis B infection. Hepatitis C Blood testing is recommended for:  Everyone born from [birth year] through 1964.  Anyone with known risk factors for hepatitis C.  Sexually Transmitted Diseases (STDs)  You should be screened each year for STDs including gonorrhea and chlamydia if: ? You are sexually active and are younger than 70 years of age. ? You are older than 70 years of age and your health care provider tells you that you are at risk for this type of infection. ? Your sexual activity has changed since you were last screened and you are at an increased risk for chlamydia or gonorrhea. Ask your health care provider if you are at risk.  Talk with your health care provider about whether you are at high risk of being infected with HIV. Your health care provider may recommend a prescription medicine to help prevent HIV infection.  What else can I do?  Schedule regular health, dental, and eye exams.  Stay current with your vaccines (immunizations).  Do not use any tobacco products, such as cigarettes, chewing tobacco, and e-cigarettes. If you need help quitting, ask your health care provider.  Limit alcohol intake to no  more than 2 drinks per day. One drink equals 12 ounces of beer, 5 ounces of wine, or 1 ounces of hard liquor.  Do not use street drugs.  Do not share needles.  Ask your health care provider for help if you need support or information about quitting drugs.  Tell your health care provider if you often feel depressed.  Tell your health care provider if you have ever been abused or do not feel safe at home. This information is not intended to replace advice given to you by your health care provider. Make sure you discuss any questions you have with your health care provider. Document Released: 05/09/2008 Document Revised: 07/10/2016 Document Reviewed: 08/15/2015 Elsevier Interactive Patient Education  [employer].

## 2017-06-18 ENCOUNTER — Encounter: Payer: Self-pay | Admitting: Family Medicine

## 2017-06-18 DIAGNOSIS — I10 Essential (primary) hypertension: Secondary | ICD-10-CM | POA: Diagnosis not present

## 2017-06-18 DIAGNOSIS — H5200 Hypermetropia, unspecified eye: Secondary | ICD-10-CM | POA: Diagnosis not present

## 2017-06-18 DIAGNOSIS — E109 Type 1 diabetes mellitus without complications: Secondary | ICD-10-CM | POA: Diagnosis not present

## 2017-06-18 LAB — HM DIABETES EYE EXAM

## 2017-06-20 ENCOUNTER — Other Ambulatory Visit: Payer: Self-pay | Admitting: Family Medicine

## 2017-06-30 ENCOUNTER — Encounter: Payer: Self-pay | Admitting: *Deleted

## 2017-07-07 ENCOUNTER — Telehealth: Payer: Self-pay | Admitting: Family Medicine

## 2017-07-07 MED ORDER — SPIRIVA HANDIHALER 18 MCG IN CAPS: 18.0000 ug | ORAL_CAPSULE | Freq: Every day | RESPIRATORY_TRACT | 1 refills | Status: DC

## 2017-07-07 NOTE — Telephone Encounter (Signed)
Patient states that he has been out of spiriva for awhile now and is inquiring about refill for spiriva.  He states his [facility] has not gotten response.  Please advise if he can get rf sent to [facility].

## 2017-07-10 ENCOUNTER — Ambulatory Visit (INDEPENDENT_AMBULATORY_CARE_PROVIDER_SITE_OTHER): Payer: Medicare HMO | Admitting: Family Medicine

## 2017-07-10 ENCOUNTER — Encounter: Payer: Self-pay | Admitting: Family Medicine

## 2017-07-10 VITALS — BP 110/70 | HR 59 | Temp 98.3°F | Resp 20 | Ht 70.0 in | Wt 189.5 lb

## 2017-07-10 DIAGNOSIS — I2510 Atherosclerotic heart disease of native coronary artery without angina pectoris: Secondary | ICD-10-CM

## 2017-07-10 DIAGNOSIS — N183 Chronic kidney disease, stage 3 unspecified: Secondary | ICD-10-CM

## 2017-07-10 DIAGNOSIS — K219 Gastro-esophageal reflux disease without esophagitis: Secondary | ICD-10-CM

## 2017-07-10 DIAGNOSIS — E7800 Pure hypercholesterolemia, unspecified: Secondary | ICD-10-CM | POA: Diagnosis not present

## 2017-07-10 DIAGNOSIS — I252 Old myocardial infarction: Secondary | ICD-10-CM | POA: Diagnosis not present

## 2017-07-10 DIAGNOSIS — E119 Type 2 diabetes mellitus without complications: Secondary | ICD-10-CM

## 2017-07-10 DIAGNOSIS — Z23 Encounter for immunization: Secondary | ICD-10-CM

## 2017-07-10 DIAGNOSIS — Z79899 Other long term (current) drug therapy: Secondary | ICD-10-CM

## 2017-07-10 DIAGNOSIS — F3341 Major depressive disorder, recurrent, in partial remission: Secondary | ICD-10-CM

## 2017-07-10 DIAGNOSIS — J42 Unspecified chronic bronchitis: Secondary | ICD-10-CM

## 2017-07-10 DIAGNOSIS — I10 Essential (primary) hypertension: Secondary | ICD-10-CM | POA: Diagnosis not present

## 2017-07-10 LAB — LIPID PANEL
CHOL/HDL RATIO: 3
Cholesterol: 129 mg/dL (ref 0–200)
HDL: 42.1 mg/dL (ref >39.00)
LDL Cholesterol: 58 mg/dL (ref 0–99)
NONHDL: 87.09
Triglycerides: 144 mg/dL (ref 0.0–149.0)
VLDL: 28.8 mg/dL (ref 0.0–40.0)

## 2017-07-10 LAB — VITAMIN B12: VITAMIN B 12: 1280 pg/mL — AB (ref 211–911)

## 2017-07-10 LAB — VITAMIN D 25 HYDROXY (VIT D DEFICIENCY, FRACTURES): VITD: 50.14 ng/mL (ref 30.00–100.00)

## 2017-07-10 LAB — POCT GLYCOSYLATED HEMOGLOBIN (HGB A1C): HEMOGLOBIN A1C: 5.8

## 2017-07-10 LAB — TSH: TSH: 1.6 u[IU]/mL (ref 0.35–4.50)

## 2017-07-10 LAB — MAGNESIUM: MAGNESIUM: 1.6 mg/dL (ref 1.5–2.5)

## 2017-07-10 MED ORDER — METFORMIN HCL ER 500 MG PO TB24: ORAL_TABLET | ORAL | 1 refills | Status: DC

## 2017-07-10 MED ORDER — METOPROLOL TARTRATE 50 MG PO TABS: 50.0000 mg | ORAL_TABLET | Freq: Every day | ORAL | 1 refills | Status: DC

## 2017-07-10 MED ORDER — PANTOPRAZOLE SODIUM 40 MG PO TBEC: 40.0000 mg | DELAYED_RELEASE_TABLET | Freq: Two times a day (BID) | ORAL | 1 refills | Status: DC

## 2017-07-10 MED ORDER — BUPROPION HCL ER (XL) 300 MG PO TB24: 300.0000 mg | ORAL_TABLET | Freq: Every day | ORAL | 1 refills | Status: DC

## 2017-07-10 MED ORDER — SYMBICORT 160-4.5 MCG/ACT IN AERO: 2.0000 | INHALATION_SPRAY | Freq: Two times a day (BID) | RESPIRATORY_TRACT | 3 refills | Status: DC

## 2017-07-10 MED ORDER — MIRTAZAPINE 15 MG PO TBDP: ORAL_TABLET | ORAL | 1 refills | Status: DC

## 2017-07-10 MED ORDER — TRAZODONE HCL 50 MG PO TABS: 150.0000 mg | ORAL_TABLET | Freq: Every day | ORAL | 1 refills | Status: DC

## 2017-07-10 NOTE — Patient Instructions (Signed)
You look great. Nice to see you again.  Follow up every 6 months on medical conditions.   I have refilled all your medications, except the Spiriva. If you find you do need this medication then just call in and we will refill it for you.     Please help [place] help you:  We are honored you have chosen [facility] for your Primary Care home. Below you will find basic instructions that you may need to access in the future. Please help [place] help you by reading the instructions, which cover many of the frequent questions we experience.   Prescription refills and request:  -In order to allow more efficient response time, please call your pharmacy for all refills. They will forward the request electronically to [place]. This allows for the quickest possible response. Request left on a nurse line can take longer to refill, since these are checked as time allows between office patients and other phone calls.  - refill request can take up to 3-5 working days to complete.  - If request is sent electronically and request is appropiate, it is usually completed in 1-2 business days.  - all patients will need to be seen routinely for all chronic medical conditions requiring prescription medications (see follow-up below). If you are overdue for follow up on your condition, you will be asked to make an appointment and we will call in enough medication to cover you until your appointment (up to 30 days).  - all controlled substances will require a face to face visit to request/refill.  - if you desire your prescriptions to go through a new pharmacy, and have an active script at original pharmacy, you will need to call your pharmacy and have scripts transferred to new pharmacy. This is completed between the pharmacy locations and not by your provider.    Results: If any images or labs were ordered, it can take up to 1 week to get results depending on the test ordered and the lab/facility running and resulting the  test. - Normal or stable results, which do not need further discussion, may be released to your mychart immediately with attached note to you. A call may not be generated for normal results. Please make certain to sign up for mychart. If you have questions on how to activate your mychart you can call the front office.  - If your results need further discussion, our office will attempt to contact you via phone, and if unable to reach you after 2 attempts, we will release your abnormal result to your mychart with instructions.  - All results will be automatically released in mychart after 1 week.  - Your provider will provide you with explanation and instruction on all relevant material in your results. Please keep in mind, results and labs may appear confusing or abnormal to the untrained eye, but it does not mean they are actually abnormal for you personally. If you have any questions about your results that are not covered, or you desire more detailed explanation than what was provided, you should make an appointment with your provider to do so.   Our office handles many outgoing and incoming calls daily. If we have not contacted you within 1 week about your results, please check your mychart to see if there is a message first and if not, then contact our office.  In helping with this matter, you help decrease call volume, and therefore allow [place] to be able to respond to patients needs  more efficiently.   Acute office visits (sick visit):  An acute visit is intended for a new problem and are scheduled in shorter time slots to allow schedule openings for patients with new problems. This is the appropriate visit to discuss a new problem. In order to provide you with excellent quality medical care with proper time for you to explain your problem, have an exam and receive treatment with instructions, these appointments should be limited to one new problem per visit. If you experience a new problem, in which you  desire to be addressed, please make an acute office visit, we save openings on the schedule to accommodate you. Please do not save your new problem for any other type of visit, let us take care of it properly and quickly for you.   Follow up visits:  Depending on your condition(s) your provider will need to see you routinely in order to provide you with quality care and prescribe medication(s). Most chronic conditions (Example: hypertension, Diabetes, depression/anxiety... etc), require visits a couple times a year. Your provider will instruct you on proper follow up for your personal medical conditions and history. Please make certain to make follow up appointments for your condition as instructed. Failing to do so could result in lapse in your medication treatment/refills. If you request a refill, and are overdue to be seen on a condition, we will always provide you with a 30 day script (once) to allow you time to schedule.    Medicare wellness (well visit): - we have a wonderful Nurse Maudie Mercury), that will meet with you and provide you will yearly medicare wellness visits. These visits should occur yearly (can not be scheduled less than 1 calendar year apart) and cover preventive health, immunizations, advance directives and screenings you are entitled to yearly through your medicare benefits. Do not miss out on your entitled benefits, this is when medicare will pay for these benefits to be ordered for you.  These are strongly encouraged by your provider and is the appropriate type of visit to make certain you are up to date with all preventive health benefits. If you have not had your medicare wellness exam in the last 12 months, please make certain to schedule one by calling the office and schedule your medicare wellness with Maudie Mercury as soon as possible.   Yearly physical (well visit):  - Adults are recommended to be seen yearly for physicals. Check with your insurance and date of your last physical, most  insurances require one calendar year between physicals. Physicals include all preventive health topics, screenings, medical exam and labs that are appropriate for gender/age and history. You may have fasting labs needed at this visit. This is a well visit (not a sick visit), new problems should not be covered during this visit (see acute visit).  - Pediatric patients are seen more frequently when they are younger. Your provider will advise you on well child visit timing that is appropriate for your their age. - This is not a medicare wellness visit. Medicare wellness exams do not have an exam portion to the visit. Some medicare companies allow for a physical, some do not allow a yearly physical. If your medicare allows a yearly physical you can schedule the medicare wellness with our nurse Maudie Mercury and have your physical with your provider after, on the same day. Please check with insurance for your full benefits.   Late Policy/No Shows:  - all new patients should arrive 15-30 minutes earlier than appointment to allow  [place] time  to  obtain all personal demographics,  insurance information and for you to complete office paperwork. - All established patients should arrive 10-15 minutes earlier than appointment time to update all information and be checked in .  - In our best efforts to run on time, if you are late for your appointment you will be asked to either reschedule or if able, we will work you back into the schedule. There will be a wait time to work you back in the schedule,  depending on availability.  - If you are unable to make it to your appointment as scheduled, please call 24 hours ahead of time to allow [place] to fill the time slot with someone else who needs to be seen. If you do not cancel your appointment ahead of time, you may be charged a no show fee.

## 2017-07-10 NOTE — Progress Notes (Signed)
[name] , December 21, 1945, 71 y.o., male MRN: [id number] Patient Care Team    Relationship Specialty Notifications Start End  [name], DO PCP - General Family Medicine  12/25/15   Brunetta Genera, MD Consulting Physician Hematology  09/03/16   Josue Hector, MD Consulting Physician Cardiology  09/03/16   Wilford Corner, MD Consulting Physician Gastroenterology  09/03/16   Melina Schools, OD  Optometry  09/03/16   Kathie Rhodes, MD Consulting Physician Urology  09/10/16     Chief Complaint  Patient presents with  . Hypertension  . Depression  . Diabetes    Subjective:  Recurrent major depressive disorder, in partial remission [facility]) Patient reports he feels rather well on his current medication regimen of trazodone, Remeron and Wellbutrin. He denies negative side effects, he is sleeping much better and feel that his mood is improved. He would like refills on these medications.   Gastroesophageal reflux disease, esophagitis presence not specified  EGD 03/2016 by Dr. Michail Sermon with signs of barrett's esophagus. Patient was tried on lower dose medication hypertonic, and was unable to tolerate secondary to return of symptoms. He does not take a vitamin D or B-12 supplement, but does take a multivitamin.  COPD [facility])  Patient reports the inhalers are working well for him. He is actually not taking the Spiriva for the last 2 months and is doing well just on the Symbicort. He would like refills on the Symbicort today. He is due for pneumococcal vaccination, and is agreeable today.  Atherosclerosis of native coronary artery of native heart without angina pectoris/Essential hypertension/HYPERCHOLESTEROLEMIA/History of MI (myocardial infarction) Stage 3 chronic kidney disease Pt reports compliance with metoprolol 50 mg daily. Blood pressures ranges at home are not routinely checked. Patient denies chest pain, shortness of breath or lower extremity edema. Pt takes a daily baby  ASA and Plavix. Pt is  prescribed statin.e is patient is followed by Dr. Johnsie Cancel Cardiology. BMP: 01/28/2017  GFR 55 CBC: 01/28/2017, within normal limits, with normal iron panel. (h/o IDA with iron transfusions) Lipids: 07/10/2017 within normal limits. Diet: Low-sodium RF: Hypertension, hyperlipidemia, diabetes  Type 2 diabetes mellitus without complication, without long-term current use of insulin ([place]) patient reports compliance with metformin 500 mg every other day. Denies numbness, tingling of extremities, hypo/hyperglycemic events or non-healing wounds. Pt reports BG ranges not routinely checked. - Foot exam: 09/03/16 - eye exam: 06/18/2017 - PNA: prevnar 09/03/2016, PSV 23 --> due 08/2017 - Flu: 09/03/16, encouraged yearly - Urine Microalbumin w/creat. Ratio: 09/03/16   Depression screen [facility] 2/9 07/10/2017 06/16/2017 03/17/2017 12/25/2015 12/25/2015  Decreased Interest 0 0 0 3 3  Down, Depressed, Hopeless 1 1 0 3 3  PHQ - 2 Score 1 1 0 6 6  Altered sleeping 3 - - 3 2  Tired, decreased energy 0 - - 3 2  Change in appetite 0 - - 3 2  Feeling bad or failure about yourself  1 - - 1 1  Trouble concentrating 1 - - 1 2  Moving slowly or fidgety/restless 0 - - 1 1  Suicidal thoughts 0 - - 0 1  PHQ-9 Score 6 - - 18 17  Difficult doing work/chores - - - Somewhat difficult Somewhat difficult    Allergies  Allergen Reactions  . Altace [Ramipril] Cough  . Codeine     Makes sick  . Tape     Blisters   . Zantac [Ranitidine Hcl] Dermatitis   Social History  Substance Use Topics  .  Smoking status: Former Smoker    Types: Cigarettes    Quit date: 11/25/1998  . Smokeless tobacco: Never Used  . Alcohol use No   Past Medical History:  Diagnosis Date  . Adenomatous colon polyp 2011  . Barrett esophagus 12/2013   EGD  . CAD (coronary artery disease)    ETT/Lexiscan-Myoview (11/14):  Normal; no ischemia, EF 67%  . CKD (chronic kidney disease), stage III   . Colon polyp 09/2010    adenoma  . COPD (chronic obstructive pulmonary disease) ([place]) 2009   spirometry with "moderate COPD"  . Depression   . Diabetes mellitus without complication ([place])   . Dyspnea   . GERD (gastroesophageal reflux disease)   . GI bleed    after polypectomy/admitted  . HTN (hypertension)   . Hypercholesterolemia   . Myocardial infarction ([place])   . Obesity   . Sleep apnea   . Spontaneous pneumothorax   . Vertigo    Past Surgical History:  Procedure Laterality Date  . APPENDECTOMY    . PLEURAL SCARIFICATION  1987  . TONSILLECTOMY    . VARICOCELECTOMY     Family History  Problem Relation Age of Onset  . Heart disease Mother   . CVA Mother   . Coronary artery disease Father 56  . Heart disease Father   . Stroke Father   . Hypertension Unknown   . Hyperlipidemia Unknown   . Cancer - Other Brother        Renal cell carcinoma  . Colon polyps Sister   . Cancer Sister    Allergies as of 07/10/2017      Reactions   Altace [ramipril] Cough   Codeine    Makes sick   Tape    Blisters   Zantac [ranitidine Hcl] Dermatitis      Medication List       Accurate as of 07/10/17 10:03 AM. Always use your most recent med list.          aspirin EC 81 MG tablet Take 81 mg by mouth daily.   atorvastatin 80 MG tablet Commonly known as:  LIPITOR TAKE 1 TABLET EVERY DAY   buPROPion 300 MG 24 hr tablet Commonly known as:  WELLBUTRIN XL Take 1 tablet (300 mg total) by mouth daily.   clopidogrel 75 MG tablet Commonly known as:  PLAVIX Take 75 mg by mouth daily.   iron polysaccharides 150 MG capsule Commonly known as:  NIFEREX Take 1 capsule (150 mg total) by mouth daily.   metFORMIN 500 MG 24 hr tablet Commonly known as:  GLUCOPHAGE-XR 500 mg every other day.   metoprolol tartrate 50 MG tablet Commonly known as:  LOPRESSOR TAKE 1 TABLET EVERY DAY   mirtazapine 15 MG disintegrating tablet Commonly known as:  REMERON SOL-TAB DISSOLVE 1 TABLET ON THE TONGUE AT BEDTIME     multivitamin tablet Take 1 tablet by mouth daily.   nitroGLYCERIN 0.4 MG SL tablet Commonly known as:  NITROSTAT Place 0.4 mg under the tongue every 5 (five) minutes as needed for chest pain (3 doses max).   pantoprazole 40 MG tablet Commonly known as:  PROTONIX Take 1 tablet (40 mg total) by mouth 2 (two) times daily.   SPIRIVA HANDIHALER 18 MCG inhalation capsule Generic drug:  tiotropium Place 1 capsule (18 mcg total) into inhaler and inhale daily.   SYMBICORT 160-4.5 MCG/ACT inhaler Generic drug:  budesonide-formoterol INHALE 2 PUFFS INTO THE LUNGS 2 (TWO) TIMES DAILY.   tamsulosin 0.4 MG Caps  capsule Commonly known as:  FLOMAX TAKE 1 CAPSULE BY MOUTH ONCE AT NIGHT AT BEDTIME   traZODone 50 MG tablet Commonly known as:  DESYREL TAKE 3 TABLETS AT BEDTIME   VITAMIN B 12 PO Take by mouth.       No results found for this or any previous visit (from the past 24 hour(s)). No results found.   ROS: Negative, with the exception of above mentioned in HPI   Objective:  BP 110/70 (BP Location: Right Arm, Patient Position: Sitting, Cuff Size: Normal)   Pulse (!) 59   Temp 98.3 F (36.8 C)   Resp 20   Ht 5\' 10"  (1.778 m)   Wt 189 lb 8 oz (86 kg)   SpO2 97%   BMI 27.19 kg/m  Body mass index is 27.19 kg/m. Gen: Afebrile. No acute distress. Nontoxic in appearance, well developed, well nourished. Pleasant caucasian male. Looks well today. HENT: AT. [place].  MMM, no oral lesions. Mild hoarseness present, no cough.  Eyes:Pupils Equal Round Reactive to light, Extraocular movements intact,  Conjunctiva without redness, discharge or icterus. Neck/lymp/endocrine: Supple,no lymphadenopathy CV: RRR no murmur, no edema Chest: CTAB, no wheeze or crackles. Good air movement, normal resp effort.  Abd: Soft.. NTND. BS present.  Neuro:  Normal gait. PERLA. EOMi. Alert. Oriented x3  Psych: Normal affect, dress and demeanor. Normal speech. Normal thought content and  judgment.  Assessment/Plan: JARRELL ARMOND is a 71 y.o. male present for OV for  Recurrent major depressive disorder, in partial remission ([place]) - Stable on current regimen, refills have been prescribed. - continue trazodone 2-3 tabs QHS PRN, remeron 15 qhs, wellbutrin 300 QD.  - mirtazapine (REMERON SOL-TAB) 15 MG disintegrating tablet; DISSOLVE 1 TABLET ON THE TONGUE AT BEDTIME  Dispense: 90 tablet; Refill: 1 - buPROPion (WELLBUTRIN XL) 300 MG 24 hr tablet; Take 1 tablet (300 mg total) by mouth daily.  Dispense: 90 tablet; Refill: 1 - Follow-up 4 months  Gastroesophageal reflux disease, esophagitis presence not specified - Continue Protonix 40 mg twice a day, was unable to take her back secondary to recurrence of symptoms. - B 12, vitamin D and magnesium levels collected today. - pantoprazole (PROTONIX) 40 MG tablet; Take 1 tablet (40 mg total) by mouth 2 (two) times daily.  Dispense: 180 tablet; Refill: 1  Chronic bronchitis, unspecified chronic bronchitis type ([place]) - Stable today. Continue Symbicort. Can hold on Spiriva if feeling okay on current regimen. May need to add the Spiriva during colder months if that is a trigger for him  Atherosclerosis of native coronary artery of native heart without angina pectoris/Essential hypertension/HYPERCHOLESTEROLEMIA/History of MI (myocardial infarction) Stage 3 chronic kidney disease - following with cards, Dr. Johnsie Cancel. - BP stable today. - continue statin  - BMP 55, monitor q 6 mos. (01/28/2017). RF control of DM and HTN are both stable.  - low sodium diet and exercise encouraged.  - continue current regimen.    Type 2 diabetes mellitus without complication, without long-term current use of insulin (HCC) - A1c today 5.9. Continue metformin every other day. - Foot exam: 09/03/16 - eye exam: 06/18/2017 - PNA: prevnar 09/03/2016, PSV 23 --> due 08/2017 - Flu: 09/03/16, encouraged yearly - Urine Microalbumin w/creat. Ratio: 09/03/16 - F/U 4  months.  Reviewed expectations re: course of current medical issues.  Discussed self-management of symptoms.  Outlined signs and symptoms indicating need for more acute intervention.  Patient verbalized understanding and all questions were answered.  Patient received an After-Visit Summary.  electronically signed by:  Howard Pouch, DO  [place]

## 2017-07-14 ENCOUNTER — Telehealth: Payer: Self-pay | Admitting: Family Medicine

## 2017-07-14 NOTE — Telephone Encounter (Signed)
Please call Mr. Gilliand, His labs are stable.  Vit d and b12 are normal also

## 2017-07-14 NOTE — Telephone Encounter (Signed)
Detailed message left on voice mail, okay per DPR.  

## 2017-07-17 ENCOUNTER — Ambulatory Visit: Payer: Medicare HMO | Admitting: Family Medicine

## 2017-07-17 DIAGNOSIS — R350 Frequency of micturition: Secondary | ICD-10-CM | POA: Diagnosis not present

## 2017-07-17 DIAGNOSIS — N401 Enlarged prostate with lower urinary tract symptoms: Secondary | ICD-10-CM | POA: Diagnosis not present

## 2017-07-17 DIAGNOSIS — R972 Elevated prostate specific antigen [PSA]: Secondary | ICD-10-CM | POA: Diagnosis not present

## 2017-07-30 ENCOUNTER — Other Ambulatory Visit: Payer: Self-pay

## 2017-07-30 DIAGNOSIS — D500 Iron deficiency anemia secondary to blood loss (chronic): Secondary | ICD-10-CM

## 2017-07-31 ENCOUNTER — Ambulatory Visit: Payer: Medicare HMO | Admitting: Hematology

## 2017-07-31 ENCOUNTER — Other Ambulatory Visit: Payer: Medicare HMO

## 2017-08-14 DIAGNOSIS — L820 Inflamed seborrheic keratosis: Secondary | ICD-10-CM | POA: Diagnosis not present

## 2017-08-14 DIAGNOSIS — L570 Actinic keratosis: Secondary | ICD-10-CM | POA: Diagnosis not present

## 2017-08-14 DIAGNOSIS — C44619 Basal cell carcinoma of skin of left upper limb, including shoulder: Secondary | ICD-10-CM | POA: Diagnosis not present

## 2017-08-14 DIAGNOSIS — D485 Neoplasm of uncertain behavior of skin: Secondary | ICD-10-CM | POA: Diagnosis not present

## 2017-08-14 DIAGNOSIS — Z85828 Personal history of other malignant neoplasm of skin: Secondary | ICD-10-CM | POA: Diagnosis not present

## 2017-08-14 DIAGNOSIS — Z08 Encounter for follow-up examination after completed treatment for malignant neoplasm: Secondary | ICD-10-CM | POA: Diagnosis not present

## 2017-09-15 ENCOUNTER — Encounter: Payer: Self-pay | Admitting: Family Medicine

## 2017-09-15 ENCOUNTER — Ambulatory Visit (INDEPENDENT_AMBULATORY_CARE_PROVIDER_SITE_OTHER): Payer: Medicare HMO | Admitting: Family Medicine

## 2017-09-15 VITALS — BP 124/79 | HR 66 | Temp 98.3°F | Resp 16 | Wt 195.0 lb

## 2017-09-15 DIAGNOSIS — Z23 Encounter for immunization: Secondary | ICD-10-CM | POA: Diagnosis not present

## 2017-09-15 DIAGNOSIS — M25561 Pain in right knee: Secondary | ICD-10-CM | POA: Diagnosis not present

## 2017-09-15 MED ORDER — METHYLPREDNISOLONE ACETATE 40 MG/ML IJ SUSP
40.0000 mg | Freq: Once | INTRAMUSCULAR | Status: AC
  Administered 2017-09-15: 40 mg via INTRAMUSCULAR

## 2017-09-15 NOTE — Addendum Note (Signed)
Addended by: Gordy Councilman on: 09/15/2017 11:04 AM   Modules accepted: Orders

## 2017-09-15 NOTE — Patient Instructions (Signed)
Prepatellar Bursitis Prepatellar bursitis is inflammation of the prepatellar bursa. The prepatellar bursa is a fluid-filled sac that cushions the kneecap (patella). Prepatellar bursitis happens when fluid builds up in the this sac and causes it to get larger. The condition causes knee pain. What are the causes? This condition may be caused by:  Constant pressure on the knees from kneeling.  A hit to the knee.  Falling on the knee.  A bacterial infection.  Moving the knee often in a forceful way.  What increases the risk? This condition is more likely to develop in:  People who play a sport that involves a risk for falls on the knee or hard hits (blows) to the knee. These sports include: ? Football. ? Wrestling. ? Basketball. ? Soccer.  People who have to kneel for long periods of time, such as roofers, plumbers, and gardeners.  People with another inflammatory condition, such as gout or rheumatoid arthritis.  What are the signs or symptoms? The most common symptom of this condition is knee pain that gets better with rest. Other symptoms include:  Swelling on the front of the kneecap.  Warmth in the knee.  Tenderness with activity.  Redness in the knee.  Inability to bend the knee or to kneel.  How is this diagnosed? This condition may be diagnosed based on:  Your symptoms.  Your medical history.  A physical exam. During the exam, your provider will compare your knees and check for tenderness and pain when moving your knee. Your health care provider may also use a needle to remove fluid from the bursa to help diagnose an infection.  Tests, such as: ? A blood test that checks for infection. ? X-rays. These may be taken to check the structure of the patella. ? MRI or ultrasound. These may be done to check for swelling and fluid buildup in the bursa.  How is this treated? This condition may be treated by:  Resting the knee.  Applying ice to the  knee.  Medicine, such as: ? Nonsteroidal anti-inflammatory drugs (NSAIDs). These can help to reduce pain and swelling. ? Antibiotic medicines. These may be needed if you have an infection. ? Steroid medicines. These may be prescribed if other treatments are not helping.  Raising (elevating) the knee while resting.  Doing strengthening and stretching exercises (physical therapy). These may be recommended after pain and swelling improve.  Having a procedure to remove fluid from the bursa. This may be done if other treatment is not helping.  Having surgery to remove the bursa. This may be done if you have a severe infection or if the condition keeps coming back after treatment.  Follow these instructions at home: Medicines  Take over-the-counter and prescription medicines only as told by your health care provider.  If you were prescribed an antibiotic medicine, take it as told by your health care provider. Do not stop taking the antibiotic even if you start to feel better. Managing pain, stiffness, and swelling  If directed, apply ice to your knee. ? Put ice in a plastic bag. ? Place a towel between your skin and the bag. ? Leave the ice on for 20 minutes, 2-3 times a day.  Elevate your knee above the level of your heart while you are sitting or lying down. Driving  Do not drive or operate heavy machinery while taking prescription pain medicine.  Ask your health care provider when it is safe fpr you to drive. Activity  Rest your knee.    Avoid activities that cause pain.  Return to your normal activities as told by your health care provider. Ask your health care provider what activities are safe for you.  Do exercises as told by your health care provider. General instructions  Do not use the injured limb to support your body weight until your health care provider says that you can.  Do not use any tobacco products, such as cigarettes, chewing tobacco, and e-cigarettes.  Tobacco can delay bone healing. If you need help quitting, ask your health care provider.  Keep all follow-up visits as told by your health care provider. This is important. How is this prevented?  Warm up and stretch before being active.  Cool down and stretch after being active.  Give your body time to rest between periods of activity.  Make sure to use equipment that fits you.  Be safe and responsible while being active to avoid falls.  Do at least 150 minutes of moderate-intensity exercise each week, such as brisk walking or water aerobics.  Maintain physical fitness, including: ? Strength. ? Flexibility. ? Cardiovascular fitness. ? Endurance. Contact a health care provider if:  Your symptoms do not improve.  Your symptoms get worse.  Your symptoms keep coming back after treatment.  You develop a fever and have warmth, redness, and swelling over your knee. This information is not intended to replace advice given to you by your health care provider. Make sure you discuss any questions you have with your health care provider. Document Released: 11/11/2005 Document Revised: 07/16/2016 Document Reviewed: 08/11/2015 Elsevier Interactive Patient Education  2018 Elsevier Inc.  
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
## 2017-11-19 ENCOUNTER — Other Ambulatory Visit: Payer: Self-pay | Admitting: Cardiovascular Disease

## 2017-12-01 ENCOUNTER — Ambulatory Visit (INDEPENDENT_AMBULATORY_CARE_PROVIDER_SITE_OTHER): Payer: Medicare HMO | Admitting: Family Medicine

## 2017-12-01 ENCOUNTER — Encounter: Payer: Self-pay | Admitting: Family Medicine

## 2017-12-01 VITALS — BP 139/84 | HR 59 | Temp 98.6°F | Resp 20 | Wt 193.0 lb

## 2017-12-01 DIAGNOSIS — J0100 Acute maxillary sinusitis, unspecified: Secondary | ICD-10-CM | POA: Diagnosis not present

## 2017-12-01 MED ORDER — DOXYCYCLINE HYCLATE 100 MG PO TABS: 100.0000 mg | ORAL_TABLET | Freq: Two times a day (BID) | ORAL | 0 refills | Status: DC

## 2017-12-01 MED ORDER — BENZONATATE 100 MG PO CAPS: 100.0000 mg | ORAL_CAPSULE | Freq: Two times a day (BID) | ORAL | 0 refills | Status: DC | PRN

## 2017-12-01 NOTE — Patient Instructions (Signed)
Rest, hydrate.  + flonase nasal spray, mucinex (DM if cough), nettie pot or nasal saline.  doxycyline  prescribed, take until completed. Every 12 hours for 10 days.  Tessalon perles for cough.  If cough present it can last up to 6-8 weeks.  F/U 2 weeks of not improved.      Sinusitis, Adult Sinusitis is soreness and inflammation of your sinuses. Sinuses are hollow spaces in the bones around your face. They are located:  Around your eyes.  In the middle of your forehead.  Behind your nose.  In your cheekbones.  Your sinuses and nasal passages are lined with a stringy fluid (mucus). Mucus normally drains out of your sinuses. When your nasal tissues get inflamed or swollen, the mucus can get trapped or blocked so air cannot flow through your sinuses. This lets bacteria, viruses, and funguses grow, and that leads to infection. Follow these instructions at home: Medicines  Take, use, or apply over-the-counter and prescription medicines only as told by your doctor. These may include nasal sprays.  If you were prescribed an antibiotic medicine, take it as told by your doctor. Do not stop taking the antibiotic even if you start to feel better. Hydrate and Humidify  Drink enough water to keep your pee (urine) clear or pale yellow.  Use a cool mist humidifier to keep the humidity level in your home above 50%.  Breathe in steam for 10-15 minutes, 3-4 times a day or as told by your doctor. You can do this in the bathroom while a hot shower is running.  Try not to spend time in cool or dry air. Rest  Rest as much as possible.  Sleep with your head raised (elevated).  Make sure to get enough sleep each night. General instructions  Put a warm, moist washcloth on your face 3-4 times a day or as told by your doctor. This will help with discomfort.  Wash your hands often with soap and water. If there is no soap and water, use hand sanitizer.  Do not smoke. Avoid being around people  who are smoking (secondhand smoke).  Keep all follow-up visits as told by your doctor. This is important. Contact a doctor if:  You have a fever.  Your symptoms get worse.  Your symptoms do not get better within 10 days. Get help right away if:  You have a very bad headache.  You cannot stop throwing up (vomiting).  You have pain or swelling around your face or eyes.  You have trouble seeing.  You feel confused.  Your neck is stiff.  You have trouble breathing. This information is not intended to replace advice given to you by your health care provider. Make sure you discuss any questions you have with your health care provider. Document Released: 04/29/2008 Document Revised: 07/07/2016 Document Reviewed: 09/06/2015 Elsevier Interactive Patient Education  [employer].

## 2017-12-01 NOTE — Progress Notes (Signed)
[name] , 07-19-1946, 72 y.o., male MRN: [id number] Patient Care Team    Relationship Specialty Notifications Start End  [name], DO PCP - General Family Medicine  12/25/15   Brunetta Genera, MD Consulting Physician Hematology  09/03/16   Josue Hector, MD Consulting Physician Cardiology  09/03/16   Wilford Corner, MD Consulting Physician Gastroenterology  09/03/16   Melina Schools, OD  Optometry  09/03/16   Kathie Rhodes, MD Consulting Physician Urology  09/10/16     Chief Complaint  Patient presents with  . URI    congestion,facial pressure x 2 weeks     Subjective: Pt presents for an OV with complaints of cough of 2 weeks duration.  Associated symptoms include nonproductive cough, hoarseness, facial pressure, fever. He thought it was getting better, but then symptoms returned more severe. He denies wheezing or shortness of breath.  Pt has tried mucinex to ease their symptoms.   Depression screen [facility] 2/9 12/01/2017 07/10/2017 06/16/2017 03/17/2017 12/25/2015  Decreased Interest 0 0 0 0 3  Down, Depressed, Hopeless 0 1 1 0 3  PHQ - 2 Score 0 1 1 0 6  Altered sleeping - 3 - - 3  Tired, decreased energy - 0 - - 3  Change in appetite - 0 - - 3  Feeling bad or failure about yourself  - 1 - - 1  Trouble concentrating - 1 - - 1  Moving slowly or fidgety/restless - 0 - - 1  Suicidal thoughts - 0 - - 0  PHQ-9 Score - 6 - - 18  Difficult doing work/chores - - - - Somewhat difficult    Allergies  Allergen Reactions  . Altace [Ramipril] Cough  . Codeine     Makes sick  . Tape     Blisters   . Zantac [Ranitidine Hcl] Dermatitis   Social History   Tobacco Use  . Smoking status: Former Smoker    Types: Cigarettes    Last attempt to quit: 11/25/1998    Years since quitting: 19.0  . Smokeless tobacco: Never Used  Substance Use Topics  . Alcohol use: No   Past Medical History:  Diagnosis Date  . Adenomatous colon polyp 2011  . Barrett esophagus 12/2013   EGD  . CAD (coronary artery disease)    ETT/Lexiscan-Myoview (11/14):  Normal; no ischemia, EF 67%  . CKD (chronic kidney disease), stage III ([place])   . Colon polyp 09/2010   adenoma  . COPD (chronic obstructive pulmonary disease) ([place]) 2009   spirometry with "moderate COPD"  . Depression   . Diabetes mellitus without complication ([place])   . Dyspnea   . GERD (gastroesophageal reflux disease)   . GI bleed    after polypectomy/admitted  . HTN (hypertension)   . Hypercholesterolemia   . Myocardial infarction ([place])   . Obesity   . Sleep apnea   . Spontaneous pneumothorax   . Vertigo    Past Surgical History:  Procedure Laterality Date  . APPENDECTOMY    . PLEURAL SCARIFICATION  1987  . TONSILLECTOMY    . VARICOCELECTOMY     Family History  Problem Relation Age of Onset  . Heart disease Mother   . CVA Mother   . Coronary artery disease Father 67  . Heart disease Father   . Stroke Father   . Hypertension Unknown   . Hyperlipidemia Unknown   . Cancer - Other Brother        Renal  cell carcinoma  . Colon polyps Sister   . Cancer Sister    Allergies as of 12/01/2017      Reactions   Altace [ramipril] Cough   Codeine    Makes sick   Tape    Blisters   Zantac [ranitidine Hcl] Dermatitis      Medication List        Accurate as of 12/01/17  1:42 PM. Always use your most recent med list.          aspirin EC 81 MG tablet Take 81 mg by mouth daily.   atorvastatin 80 MG tablet Commonly known as:  LIPITOR TAKE 1 TABLET EVERY DAY   buPROPion 300 MG 24 hr tablet Commonly known as:  WELLBUTRIN XL Take 1 tablet (300 mg total) by mouth daily.   clopidogrel 75 MG tablet Commonly known as:  PLAVIX TAKE 1 TABLET EVERY DAY   iron polysaccharides 150 MG capsule Commonly known as:  NIFEREX Take 1 capsule (150 mg total) by mouth daily.   metFORMIN 500 MG 24 hr tablet Commonly known as:  GLUCOPHAGE-XR 500 mg every other day.   metoprolol tartrate 50 MG tablet Commonly  known as:  LOPRESSOR Take 1 tablet (50 mg total) by mouth daily.   mirtazapine 15 MG disintegrating tablet Commonly known as:  REMERON SOL-TAB DISSOLVE 1 TABLET ON THE TONGUE AT BEDTIME   multivitamin tablet Take 1 tablet by mouth daily.   nitroGLYCERIN 0.4 MG SL tablet Commonly known as:  NITROSTAT Place 0.4 mg under the tongue every 5 (five) minutes as needed for chest pain (3 doses max).   pantoprazole 40 MG tablet Commonly known as:  PROTONIX Take 1 tablet (40 mg total) by mouth 2 (two) times daily.   SPIRIVA HANDIHALER 18 MCG inhalation capsule Generic drug:  tiotropium Place 1 capsule (18 mcg total) into inhaler and inhale daily.   SYMBICORT 160-4.5 MCG/ACT inhaler Generic drug:  budesonide-formoterol Inhale 2 puffs into the lungs 2 (two) times daily.   tamsulosin 0.4 MG Caps capsule Commonly known as:  FLOMAX TAKE 1 CAPSULE BY MOUTH ONCE AT NIGHT AT BEDTIME   traZODone 50 MG tablet Commonly known as:  DESYREL Take 3 tablets (150 mg total) by mouth at bedtime.   VITAMIN B 12 PO Take by mouth.       All past medical history, surgical history, allergies, family history, immunizations andmedications were updated in the EMR today and reviewed under the history and medication portions of their EMR.     ROS: Negative, with the exception of above mentioned in HPI   Objective:  BP 139/84 (BP Location: Left Arm, Patient Position: Sitting, Cuff Size: Normal)   Pulse (!) 59   Temp 98.6 F (37 C)   Resp 20   Wt 193 lb (87.5 kg)   SpO2 97%   BMI 27.69 kg/m  Body mass index is 27.69 kg/m. Gen: Afebrile. No acute distress. Nontoxic in appearance, well developed, well nourished. Very pleasant Caucasian male.  HENT: AT. [place]. Bilateral TM visualized with erythema or bulging. MMM, no oral lesions. Bilateral nares with erythema, swelling and drainage. Throat without erythema or exudates. Hoarseness, cough, sinus pressure present.  Eyes:Pupils Equal Round Reactive to light,  Extraocular movements intact,  Conjunctiva without redness, discharge or icterus. Neck/lymp/endocrine: Supple, no  lymphadenopathy CV: RRR, no edema Chest: CTAB, no wheeze or crackles. Good air movement, normal resp effort.  Abd: Soft. NTND. BS present. Skin: no rashes, purpura or petechiae.  Neuro:  Normal  gait. PERLA. EOMi. Alert. Oriented x3   No exam data present No results found. No results found for this or any previous visit (from the past 24 hour(s)).  Assessment/Plan: LYAL HUSTED is a 72 y.o. male present for OV for  Maxillary sinusitis:  Rest, hydrate.  + flonase nasal spray, mucinex (DM if cough), nettie pot or nasal saline.  doxycyline  prescribed, take until completed. Every 12 hours for 10 days.  Tessalon perles for cough.  If cough present it can last up to 6-8 weeks.  F/U 2 weeks of not improved.   Reviewed expectations re: course of current medical issues.  Discussed self-management of symptoms.  Outlined signs and symptoms indicating need for more acute intervention.  Patient verbalized understanding and all questions were answered.  Patient received an After-Visit Summary.    No orders of the defined types were placed in this encounter.    Note is dictated utilizing voice recognition software. Although note has been proof read prior to signing, occasional typographical errors still can be missed. If any questions arise, please do not hesitate to call for verification.   electronically signed by:  Howard Pouch, DO  [place]

## 2017-12-11 ENCOUNTER — Ambulatory Visit: Payer: Medicare HMO | Admitting: Family Medicine

## 2018-01-07 ENCOUNTER — Encounter: Payer: Self-pay | Admitting: Family Medicine

## 2018-01-07 ENCOUNTER — Ambulatory Visit (INDEPENDENT_AMBULATORY_CARE_PROVIDER_SITE_OTHER): Payer: Medicare HMO | Admitting: Family Medicine

## 2018-01-07 VITALS — BP 126/74 | HR 61 | Temp 98.0°F | Ht 70.0 in | Wt 198.8 lb

## 2018-01-07 DIAGNOSIS — J42 Unspecified chronic bronchitis: Secondary | ICD-10-CM | POA: Diagnosis not present

## 2018-01-07 DIAGNOSIS — N183 Chronic kidney disease, stage 3 unspecified: Secondary | ICD-10-CM

## 2018-01-07 DIAGNOSIS — F3341 Major depressive disorder, recurrent, in partial remission: Secondary | ICD-10-CM

## 2018-01-07 DIAGNOSIS — I2510 Atherosclerotic heart disease of native coronary artery without angina pectoris: Secondary | ICD-10-CM | POA: Diagnosis not present

## 2018-01-07 DIAGNOSIS — K219 Gastro-esophageal reflux disease without esophagitis: Secondary | ICD-10-CM | POA: Diagnosis not present

## 2018-01-07 DIAGNOSIS — E119 Type 2 diabetes mellitus without complications: Secondary | ICD-10-CM

## 2018-01-07 DIAGNOSIS — E7800 Pure hypercholesterolemia, unspecified: Secondary | ICD-10-CM | POA: Diagnosis not present

## 2018-01-07 DIAGNOSIS — I10 Essential (primary) hypertension: Secondary | ICD-10-CM

## 2018-01-07 DIAGNOSIS — I252 Old myocardial infarction: Secondary | ICD-10-CM | POA: Diagnosis not present

## 2018-01-07 DIAGNOSIS — F325 Major depressive disorder, single episode, in full remission: Secondary | ICD-10-CM | POA: Diagnosis not present

## 2018-01-07 LAB — COMPREHENSIVE METABOLIC PANEL
ALBUMIN: 4.3 g/dL (ref 3.5–5.2)
ALK PHOS: 62 U/L (ref 39–117)
ALT: 31 U/L (ref 0–53)
AST: 17 U/L (ref 0–37)
BILIRUBIN TOTAL: 0.8 mg/dL (ref 0.2–1.2)
BUN: 24 mg/dL — AB (ref 6–23)
CO2: 31 mEq/L (ref 19–32)
CREATININE: 1.21 mg/dL (ref 0.40–1.50)
Calcium: 9.7 mg/dL (ref 8.4–10.5)
Chloride: 102 mEq/L (ref 96–112)
GFR: 62.97 mL/min (ref >60.00)
GLUCOSE: 145 mg/dL — AB (ref 70–99)
POTASSIUM: 4.7 meq/L (ref 3.5–5.1)
SODIUM: 139 meq/L (ref 135–145)
TOTAL PROTEIN: 7 g/dL (ref 6.0–8.3)

## 2018-01-07 LAB — CBC
HEMATOCRIT: 39.7 % (ref 39.0–52.0)
Hemoglobin: 13.5 g/dL (ref 13.0–17.0)
MCHC: 34 g/dL (ref 30.0–36.0)
MCV: 84.8 fl (ref 78.0–100.0)
Platelets: 190 10*3/uL (ref 150.0–400.0)
RBC: 4.69 Mil/uL (ref 4.22–5.81)
RDW: 15.6 % — ABNORMAL HIGH (ref 11.5–15.5)
WBC: 8.7 10*3/uL (ref 4.0–10.5)

## 2018-01-07 LAB — MICROALBUMIN / CREATININE URINE RATIO
Creatinine,U: 34.5 mg/dL
Microalb Creat Ratio: 2 mg/g (ref 0.0–30.0)
Microalb, Ur: <0.7 mg/dL (ref 0.0–1.9)

## 2018-01-07 LAB — POCT GLYCOSYLATED HEMOGLOBIN (HGB A1C): HEMOGLOBIN A1C: 6

## 2018-01-07 MED ORDER — PANTOPRAZOLE SODIUM 40 MG PO TBEC: 40.0000 mg | DELAYED_RELEASE_TABLET | Freq: Two times a day (BID) | ORAL | 1 refills | Status: DC

## 2018-01-07 MED ORDER — ATORVASTATIN CALCIUM 80 MG PO TABS: 80.0000 mg | ORAL_TABLET | Freq: Every day | ORAL | 3 refills | Status: DC

## 2018-01-07 MED ORDER — METOPROLOL TARTRATE 50 MG PO TABS: 50.0000 mg | ORAL_TABLET | Freq: Every day | ORAL | 1 refills | Status: DC

## 2018-01-07 MED ORDER — METFORMIN HCL ER 500 MG PO TB24: ORAL_TABLET | ORAL | 1 refills | Status: DC

## 2018-01-07 MED ORDER — TRAZODONE HCL 50 MG PO TABS: 150.0000 mg | ORAL_TABLET | Freq: Every day | ORAL | 1 refills | Status: DC

## 2018-01-07 MED ORDER — BUPROPION HCL ER (XL) 300 MG PO TB24: 300.0000 mg | ORAL_TABLET | Freq: Every day | ORAL | 1 refills | Status: DC

## 2018-01-07 MED ORDER — MIRTAZAPINE 30 MG PO TBDP: ORAL_TABLET | ORAL | 1 refills | Status: DC

## 2018-01-07 MED ORDER — SYMBICORT 160-4.5 MCG/ACT IN AERO: 2.0000 | INHALATION_SPRAY | Freq: Two times a day (BID) | RESPIRATORY_TRACT | 3 refills | Status: DC

## 2018-01-07 NOTE — Patient Instructions (Signed)
All medicines stay the same, except Remeron (mirtazipine) increase to 30 mg a night. Finish the medicines you have by taking 2- 15 mg tabs. Look for new bottle to be 30 mg.    Follow up in 4 months

## 2018-01-07 NOTE — Progress Notes (Signed)
ROCKWELL ZENTZ , 1946-02-12, 72 y.o., male MRN: [id number] Patient Care Team    Relationship Specialty Notifications Start End  [name], DO PCP - General Family Medicine  12/25/15   Brunetta Genera, MD Consulting Physician Hematology  09/03/16   Josue Hector, MD Consulting Physician Cardiology  09/03/16   Wilford Corner, MD Consulting Physician Gastroenterology  09/03/16   Melina Schools, OD  Optometry  09/03/16   Kathie Rhodes, MD Consulting Physician Urology  09/10/16     Chief Complaint  Patient presents with  . Follow-up    HTN/DM    Subjective:  Recurrent major depressive disorder, in partial remission [facility]) Patient feels that his depression has mildly worsened. His wife has been out of work and this has caused some strain. He does feel he could benefit from a little more coverage with his medications. Current medication regimen of trazodone, Remeron and Wellbutrin. He denies negative side effects.  Gastroesophageal reflux disease, esophagitis presence not specified  EGD 03/2016 by Dr. Michail Sermon with signs of barrett's esophagus. Patient was tried on lower dose medication hypertonic, and was unable to tolerate secondary to return of symptoms. He does not take a vitamin D or B-12 supplement, but does take a multivitamin. He does need refills on his medications today, they are working well.  COPD [facility]) Patient reports his medications are working well for him. He is using the Symbicort daily. His pneumonia series is completed.   Atherosclerosis of native coronary artery of native heart without angina pectoris/Essential hypertension/HYPERCHOLESTEROLEMIA/History of MI (myocardial infarction) Stage 3 chronic kidney disease Pt reports compliance with metoprolol 50 mg daily. Blood pressures ranges at home are not routinely checked. Patient denies chest pain, shortness of breath, dizziness or lower extremity edema. Pt takes a daily baby ASA and Plavix. Pt is  prescribed  statin and followed by Dr. Johnsie Cancel Cardiology. BMP: 01/28/2017  GFR 55 CBC: 01/28/2017, within normal limits, with normal iron panel. (h/o IDA with iron transfusions) Lipids: 07/10/2017 within normal limits. Diet: Low-sodium RF: Hypertension, hyperlipidemia, diabetes  Type 2 diabetes mellitus without complication, without long-term current use of insulin ([place]) patient reports compliance with metformin 500 mg QOD. Patient denies dizziness, hyperglycemic or hypoglycemic events. Patient denies numbness, tingling in the extremities or nonhealing wounds of feet. Pt reports BG ranges not routinely checked. - Foot exam: 01/07/2018 - eye exam: 06/18/2017 - PNA: Series completed - Flu: 09/15/2017, encouraged yearly - Urine Microalbumin w/creat. Ratio: 09/03/16--> completed today   Depression screen [facility] 2/9 01/07/2018 12/01/2017 07/10/2017 06/16/2017 03/17/2017  Decreased Interest 2 0 0 0 0  Down, Depressed, Hopeless 2 0 1 1 0  PHQ - 2 Score 4 0 1 1 0  Altered sleeping 3 - 3 - -  Tired, decreased energy 2 - 0 - -  Change in appetite 3 - 0 - -  Feeling bad or failure about yourself  1 - 1 - -  Trouble concentrating 2 - 1 - -  Moving slowly or fidgety/restless 1 - 0 - -  Suicidal thoughts 0 - 0 - -  PHQ-9 Score 16 - 6 - -  Difficult doing work/chores - - - - -    Allergies  Allergen Reactions  . Altace [Ramipril] Cough  . Codeine     Makes sick  . Tape     Blisters   . Zantac [Ranitidine Hcl] Dermatitis   Social History   Tobacco Use  . Smoking status: Former Smoker  Types: Cigarettes    Last attempt to quit: 11/25/1998    Years since quitting: 19.1  . Smokeless tobacco: Never Used  Substance Use Topics  . Alcohol use: No   Past Medical History:  Diagnosis Date  . Adenomatous colon polyp 2011  . Barrett esophagus 12/2013   EGD  . CAD (coronary artery disease)    ETT/Lexiscan-Myoview (11/14):  Normal; no ischemia, EF 67%  . CKD (chronic kidney disease), stage III ([place])   . Colon  polyp 09/2010   adenoma  . COPD (chronic obstructive pulmonary disease) ([place]) 2009   spirometry with "moderate COPD"  . Depression   . Diabetes mellitus without complication ([place])   . Dyspnea   . GERD (gastroesophageal reflux disease)   . GI bleed    after polypectomy/admitted  . HTN (hypertension)   . Hypercholesterolemia   . Myocardial infarction ([place])   . Obesity   . Sleep apnea   . Spontaneous pneumothorax   . Vertigo    Past Surgical History:  Procedure Laterality Date  . APPENDECTOMY    . PLEURAL SCARIFICATION  1987  . TONSILLECTOMY    . VARICOCELECTOMY     Family History  Problem Relation Age of Onset  . Heart disease Mother   . CVA Mother   . Coronary artery disease Father 56  . Heart disease Father   . Stroke Father   . Hypertension Unknown   . Hyperlipidemia Unknown   . Cancer - Other Brother        Renal cell carcinoma  . Colon polyps Sister   . Cancer Sister    Allergies as of 01/07/2018      Reactions   Altace [ramipril] Cough   Codeine    Makes sick   Tape    Blisters   Zantac [ranitidine Hcl] Dermatitis      Medication List        Accurate as of 01/07/18  4:23 PM. Always use your most recent med list.          aspirin EC 81 MG tablet Take 81 mg by mouth daily.   atorvastatin 80 MG tablet Commonly known as:  LIPITOR Take 1 tablet (80 mg total) by mouth daily.   buPROPion 300 MG 24 hr tablet Commonly known as:  WELLBUTRIN XL Take 1 tablet (300 mg total) by mouth daily.   clopidogrel 75 MG tablet Commonly known as:  PLAVIX TAKE 1 TABLET EVERY DAY   iron polysaccharides 150 MG capsule Commonly known as:  NIFEREX Take 1 capsule (150 mg total) by mouth daily.   metFORMIN 500 MG 24 hr tablet Commonly known as:  GLUCOPHAGE-XR 500 mg every other day.   metoprolol tartrate 50 MG tablet Commonly known as:  LOPRESSOR Take 1 tablet (50 mg total) by mouth daily.   mirtazapine 30 MG disintegrating tablet Commonly known as:  REMERON  SOL-TAB DISSOLVE 1 TABLET ON THE TONGUE AT BEDTIME   multivitamin tablet Take 1 tablet by mouth daily.   nitroGLYCERIN 0.4 MG SL tablet Commonly known as:  NITROSTAT Place 0.4 mg under the tongue every 5 (five) minutes as needed for chest pain (3 doses max).   pantoprazole 40 MG tablet Commonly known as:  PROTONIX Take 1 tablet (40 mg total) by mouth 2 (two) times daily.   SYMBICORT 160-4.5 MCG/ACT inhaler Generic drug:  budesonide-formoterol Inhale 2 puffs into the lungs 2 (two) times daily.   tamsulosin 0.4 MG Caps capsule Commonly known as:  FLOMAX TAKE 1  CAPSULE BY MOUTH ONCE AT NIGHT AT BEDTIME   traZODone 50 MG tablet Commonly known as:  DESYREL Take 3 tablets (150 mg total) by mouth at bedtime.   VITAMIN B 12 PO Take by mouth.       Results for orders placed or performed in visit on 01/07/18 (from the past 24 hour(s))  POCT HgB A1C     Status: Normal   Collection Time: 01/07/18 10:15 AM  Result Value Ref Range   Hemoglobin A1C 6.0    No results found.   ROS: Negative, with the exception of above mentioned in HPI   Objective:  BP 126/74 (BP Location: Left Arm, Patient Position: Sitting, Cuff Size: Large)   Pulse 61   Temp 98 F (36.7 C) (Oral)   Ht 5\' 10"  (1.778 m)   Wt 198 lb 12.8 oz (90.2 kg)   SpO2 96%   BMI 28.52 kg/m  Body mass index is 28.52 kg/m. Gen: Afebrile. No acute distress. Nontoxic in appearance, well-developed, well-nourished, Caucasian male. HENT: AT. [place]. Bilateral TM visualized and normal in appearance. MMM.  Eyes:Pupils Equal Round Reactive to light, Extraocular movements intact,  Conjunctiva without redness, discharge or icterus. CV: RRR no murmur, no edema, +2/4 P posterior tibialis pulses Chest: CTAB, no wheeze or crackles Abd: Soft. NTND. BS present. No Masses palpated.  Neuro:  Normal gait. PERLA. EOMi. Alert. Oriented.  Psych: Normal affect, dress and demeanor. Normal speech. Normal thought content and judgment.  Diabetic  Foot Exam - Simple   Simple Foot Form Diabetic Foot exam was performed with the following findings:  Yes 01/07/2018  4:23 PM  Visual Inspection No deformities, no ulcerations, no other skin breakdown bilaterally:  Yes Sensation Testing Intact to touch and monofilament testing bilaterally:  Yes Pulse Check Posterior Tibialis and Dorsalis pulse intact bilaterally:  Yes Comments      Assessment/Plan: ADIEL ERNEY is a 72 y.o. male present for OV for  Recurrent major depressive disorder, in partial remission ([place]) - PHQ significantly increased. Increase Remeron to 30 mg QHS.  - continue trazodone 2-3 tabs QHS PRN - Continue wellbutrin 300 QD.  - Follow-up 4 months  Gastroesophageal reflux disease, esophagitis presence not specified - Continue Protonix 40 mg twice a day, was unable to take her back secondary to recurrence of symptoms. - B 12, vitamin D and magnesium levels collected today. - pantoprazole (PROTONIX) 40 MG tablet; Take 1 tablet (40 mg total) by mouth 2 (two) times daily.  Dispense: 180 tablet; Refill: 1  Chronic bronchitis, unspecified chronic bronchitis type ([place]) - Stable. Continue Symbicort. Can hold on Spiriva if feeling okay on current regimen. May need to add the Spiriva during colder months if that is a trigger for him  Atherosclerosis of native coronary artery of native heart without angina pectoris/Essential hypertension/HYPERCHOLESTEROLEMIA/History of MI (myocardial infarction) Stage 3 chronic kidney disease - following with cards, Dr. Johnsie Cancel. - BP stable.  - continue statin  - BMP 55, monitor q 6 mos. (01/28/2017). RF control of DM and HTN are both stable.  - low sodium diet and exercise encouraged.  - continue current regimen.  - BMP today.   Type 2 diabetes mellitus without complication, without long-term current use of insulin ([facility]) - A1c today 5.8--> 6.0. Continue metformin every other day. - Foot exam: today, 01/07/2018 - eye exam: 06/18/2017 - PNA:  series completed 2018 - Flu: 09/03/17, encouraged yearly - Urine Microalbumin w/creat. Ratio: 09/03/16--> completed today - F/U 4 months.   Reviewed  expectations re: course of current medical issues.  Discussed self-management of symptoms.  Outlined signs and symptoms indicating need for more acute intervention.  Patient verbalized understanding and all questions were answered.  Patient received an After-Visit Summary.   electronically signed by:  Howard Pouch, DO  [place]

## 2018-02-24 ENCOUNTER — Other Ambulatory Visit: Payer: Self-pay | Admitting: Cardiovascular Disease

## 2018-02-24 MED ORDER — CLOPIDOGREL BISULFATE 75 MG PO TABS: 75.0000 mg | ORAL_TABLET | Freq: Every day | ORAL | 0 refills | Status: DC

## 2018-04-27 ENCOUNTER — Ambulatory Visit: Payer: Medicare HMO | Admitting: Nurse Practitioner

## 2018-04-27 ENCOUNTER — Encounter: Payer: Self-pay | Admitting: Nurse Practitioner

## 2018-04-27 VITALS — BP 130/82 | HR 62 | Ht 70.0 in | Wt 202.1 lb

## 2018-04-27 DIAGNOSIS — I10 Essential (primary) hypertension: Secondary | ICD-10-CM

## 2018-04-27 DIAGNOSIS — I259 Chronic ischemic heart disease, unspecified: Secondary | ICD-10-CM

## 2018-04-27 MED ORDER — CLOPIDOGREL BISULFATE 75 MG PO TABS: 75.0000 mg | ORAL_TABLET | Freq: Every day | ORAL | 3 refills | Status: DC

## 2018-04-27 MED ORDER — NITROGLYCERIN 0.4 MG SL SUBL: 0.4000 mg | SUBLINGUAL_TABLET | SUBLINGUAL | 3 refills | Status: DC | PRN

## 2018-04-27 MED ORDER — CLOPIDOGREL BISULFATE 75 MG PO TABS: 75.0000 mg | ORAL_TABLET | Freq: Every day | ORAL | 0 refills | Status: DC

## 2018-04-27 NOTE — Progress Notes (Signed)
CARDIOLOGY OFFICE NOTE  Date:  04/27/2018    [name] Date of Birth: Aug 14, 1946 Medical Record #[id number]  PCP:  [name], DO  Cardiologist:  Johnsie Cancel   Chief Complaint  Patient presents with  . Coronary Artery Disease    15 month check - seen for Dr. Johnsie Cancel    History of Present Illness: CHAWN SPRAGGINS is a 72 y.o. male who presents today for a 15 month check. Seen for Dr. Johnsie Cancel.   He has known CAD with prior LAD stent from 2007. He is on chronic DAPT with Plavix/aspirin.  Remote smoker. Other issues include CKD, Barrett's esophagus, COPD, DM, depression, HLD, HTN, prior GI bleed,    Last seen in March of 2018 - felt to be doing ok. GXT was recommended - he did not wish to do.   Comes in today. Here alone. He says he is doing well. He continues to gain weight - says he is trying to eat less. Remains fairly active but remains limited from his COPD. No chest pain. Breathing seems stable. Not dizzy or lightheaded. He is out of Plavix - sounds like for sometime - was not refilled by our office. Labs are typically checked by his PCP. He really has no concerns today.   Past Medical History:  Diagnosis Date  . Adenomatous colon polyp 2011  . Barrett esophagus 12/2013   EGD  . CAD (coronary artery disease)    ETT/Lexiscan-Myoview (11/14):  Normal; no ischemia, EF 67%  . CKD (chronic kidney disease), stage III ([place])   . Colon polyp 09/2010   adenoma  . COPD (chronic obstructive pulmonary disease) ([place]) 2009   spirometry with "moderate COPD"  . Depression   . Diabetes mellitus without complication ([place])   . Dyspnea   . GERD (gastroesophageal reflux disease)   . GI bleed    after polypectomy/admitted  . HTN (hypertension)   . Hypercholesterolemia   . Myocardial infarction ([place])   . Obesity   . Sleep apnea   . Spontaneous pneumothorax   . Vertigo     Past Surgical History:  Procedure Laterality Date  . APPENDECTOMY    . PLEURAL SCARIFICATION  1987  .  TONSILLECTOMY    . VARICOCELECTOMY       Medications: Current Meds  Medication Sig  . aspirin EC 81 MG tablet Take 81 mg by mouth daily.   Marland Kitchen atorvastatin (LIPITOR) 80 MG tablet Take 1 tablet (80 mg total) by mouth daily.  Marland Kitchen buPROPion (WELLBUTRIN XL) 300 MG 24 hr tablet Take 1 tablet (300 mg total) by mouth daily.  . clopidogrel (PLAVIX) 75 MG tablet Take 1 tablet (75 mg total) by mouth daily.  . Cyanocobalamin (VITAMIN B 12 PO) Take by mouth.  . iron polysaccharides (NIFEREX) 150 MG capsule Take 1 capsule (150 mg total) by mouth daily.  . metFORMIN (GLUCOPHAGE) 500 MG tablet Take 500 mg by mouth every other day. diabetes  . metoprolol tartrate (LOPRESSOR) 50 MG tablet Take 1 tablet (50 mg total) by mouth daily.  . mirtazapine (REMERON SOL-TAB) 30 MG disintegrating tablet Take 30 mg by mouth at bedtime. Under tongue  . Multiple Vitamin (MULTIVITAMIN) tablet Take 1 tablet by mouth daily.    . nitroGLYCERIN (NITROSTAT) 0.4 MG SL tablet Place 1 tablet (0.4 mg total) under the tongue every 5 (five) minutes as needed for chest pain (3 doses max).  . pantoprazole (PROTONIX) 40 MG tablet Take 1 tablet (40 mg total) by  mouth 2 (two) times daily.  . SYMBICORT 160-4.5 MCG/ACT inhaler Inhale 2 puffs into the lungs 2 (two) times daily.  . tamsulosin (FLOMAX) 0.4 MG CAPS capsule TAKE 1 CAPSULE BY MOUTH ONCE AT NIGHT AT BEDTIME  . traZODone (DESYREL) 50 MG tablet Take 3 tablets (150 mg total) by mouth at bedtime.  . [DISCONTINUED] clopidogrel (PLAVIX) 75 MG tablet Take 75 mg by mouth daily.  . [DISCONTINUED] clopidogrel (PLAVIX) 75 MG tablet Take 1 tablet (75 mg total) by mouth daily.  . [DISCONTINUED] metFORMIN (GLUCOPHAGE-XR) 500 MG 24 hr tablet 500 mg every other day. (Patient taking differently: Take 500 mg by mouth daily with breakfast. 500 mg every other day.)  . [DISCONTINUED] mirtazapine (REMERON SOL-TAB) 30 MG disintegrating tablet DISSOLVE 1 TABLET ON THE TONGUE AT BEDTIME (Patient taking  differently: Take 30 mg by mouth at bedtime. DISSOLVE 1 TABLET ON THE TONGUE AT BEDTIME)  . [DISCONTINUED] nitroGLYCERIN (NITROSTAT) 0.4 MG SL tablet Place 0.4 mg under the tongue every 5 (five) minutes as needed for chest pain (3 doses max).     Allergies: Allergies  Allergen Reactions  . Altace [Ramipril] Cough  . Codeine     Makes sick  . Tape     Blisters   . Zantac [Ranitidine Hcl] Dermatitis    Social History: The patient  reports that he quit smoking about 19 years ago. His smoking use included cigarettes. He has never used smokeless tobacco. He reports that he does not drink alcohol or use drugs.   Family History: The patient's family history includes CVA in his mother; Cancer in his sister; Cancer - Other in his brother; Colon polyps in his sister; Coronary artery disease (age of onset: 15) in his father; Heart disease in his father and mother; Hyperlipidemia in his unknown relative; Hypertension in his unknown relative; Stroke in his father.   Review of Systems: Please see the history of present illness.   Otherwise, the review of systems is positive for none.   All other systems are reviewed and negative.   Physical Exam: VS:  BP 130/82 (BP Location: Left Arm, Patient Position: Sitting, Cuff Size: Normal)   Pulse 62   Ht 5\' 10"  (1.778 m)   Wt 202 lb 1.9 oz (91.7 kg)   BMI 29.00 kg/m  .  BMI Body mass index is 29 kg/m.  Wt Readings from Last 3 Encounters:  04/27/18 202 lb 1.9 oz (91.7 kg)  01/07/18 198 lb 12.8 oz (90.2 kg)  12/01/17 193 lb (87.5 kg)    General: Pleasant. Well developed, well nourished and in no acute distress.   HEENT: Normal.  Neck: Supple, no JVD, carotid bruits, or masses noted.  Cardiac: Regular rate and rhythm. No murmurs, rubs, or gallops. No edema.  Respiratory:  Lungs are fairly clear to auscultation bilaterally with normal work of breathing.  GI: Soft and nontender.  MS: No deformity or atrophy. Gait and ROM intact.  Skin: Warm and  dry. Color is normal.  Neuro:  Strength and sensation are intact and no gross focal deficits noted.  Psych: Alert, appropriate and with normal affect.   LABORATORY DATA:  EKG:  EKG is ordered today. This demonstrates NSR.  Lab Results  Component Value Date   WBC 8.7 01/07/2018   HGB 13.5 01/07/2018   HCT 39.7 01/07/2018   PLT 190.0 01/07/2018   GLUCOSE 145 (H) 01/07/2018   CHOL 129 07/10/2017   TRIG 144.0 07/10/2017   HDL 42.10 07/10/2017   LDLCALC 58  07/10/2017   ALT 31 01/07/2018   AST 17 01/07/2018   NA 139 01/07/2018   K 4.7 01/07/2018   CL 102 01/07/2018   CREATININE 1.21 01/07/2018   BUN 24 (H) 01/07/2018   CO2 31 01/07/2018   TSH 1.60 07/10/2017   HGBA1C 6.0 01/07/2018   MICROALBUR <0.7 01/07/2018     BNP (last 3 results) No results for input(s): BNP in the last 8760 hours.  ProBNP (last 3 results) No results for input(s): PROBNP in the last 8760 hours.   Other Studies Reviewed Today:  Myoview Impression 2014 Exercise Capacity:  Lexiscan with no exercise. BP Response:  Normal blood pressure response. Clinical Symptoms:  No significant symptoms noted. ECG Impression:  No significant ST segment change suggestive of ischemia. Comparison with Prior Nuclear Study: No significant change from previous study 01/14/11  Overall Impression:  Normal stress nuclear study.  LV Ejection Fraction: 67%.  LV Wall Motion:  NL LV Function; NL Wall Motion.   Thayer Headings, Brooke Bonito., MD, [facility] 10/13/2013, 5:17 PM Office - [phone] Pager [phone]    Assessment/Plan:  1. CAD with remote LAD stent - normal Myoview from 2014 - he is doing well clinically - no symptoms. Continue with CV risk factor modification. Plavix and NTG refilled today for him. His labs are followed by PCP.   2. HLD - on statin - last lipids noted.   3. HTN - BP fine on current regimen. No changes made today.   4. DM - followed by PCP - last A1C was excellent.   5. COPD - stable shortness  of breath.    Current medicines are reviewed with the patient today.  The patient does not have concerns regarding medicines other than what has been noted above.  The following changes have been made:  See above.  Labs/ tests ordered today include:    Orders Placed This Encounter  Procedures  . EKG 12-Lead     Disposition:   FU with Dr. Johnsie Cancel in one year.   Patient is agreeable to this plan and will call if any problems develop in the interim.   SignedTruitt Merle, NP  04/27/2018 10:55 AM  [facility] [address] [place], [place]  [address] Phone: [phone] Fax: [phone]

## 2018-04-27 NOTE — Patient Instructions (Signed)
We will be checking the following labs today - NONE   Medication Instructions:    Continue with your current medicines.   I have refilled the NTG and Clopidogrel today. I sent the Clopidogrel to your local pharmacy also.      Testing/Procedures To Be Arranged:  N/A  Follow-Up:   See Dr. Johnsie Cancel in one year.     Other Special Instructions:   N/A    If you need a refill on your cardiac medications before your next appointment, please call your pharmacy.   Call the [place] office at [phone] if you have any questions, problems or concerns.

## 2018-05-18 ENCOUNTER — Encounter: Payer: Self-pay | Admitting: Family Medicine

## 2018-05-18 ENCOUNTER — Ambulatory Visit (INDEPENDENT_AMBULATORY_CARE_PROVIDER_SITE_OTHER): Payer: Medicare HMO | Admitting: Family Medicine

## 2018-05-18 VITALS — BP 134/57 | HR 63 | Temp 98.7°F | Resp 16 | Ht 70.0 in | Wt 203.2 lb

## 2018-05-18 DIAGNOSIS — R05 Cough: Secondary | ICD-10-CM | POA: Diagnosis not present

## 2018-05-18 DIAGNOSIS — J0100 Acute maxillary sinusitis, unspecified: Secondary | ICD-10-CM | POA: Diagnosis not present

## 2018-05-18 DIAGNOSIS — R0982 Postnasal drip: Secondary | ICD-10-CM

## 2018-05-18 DIAGNOSIS — R059 Cough, unspecified: Secondary | ICD-10-CM

## 2018-05-18 MED ORDER — AZITHROMYCIN 250 MG PO TABS: ORAL_TABLET | ORAL | 0 refills | Status: DC

## 2018-05-18 NOTE — Patient Instructions (Signed)
Get otc generic robitussin DM OR Mucinex DM and use as directed on the packaging for cough and congestion. Use otc generic saline nasal spray 2-3 times per day to irrigate/moisturize your nasal passages.   

## 2018-05-18 NOTE — Progress Notes (Signed)
OFFICE VISIT  05/18/2018   CC:  Chief Complaint  Patient presents with  . Sinus Infection    ?    HPI:    Patient is a 72 y.o. Caucasian male who presents for respiratory complaints. Onset 2 weeks ago, nasal/sinus congestion, PND, ears feel stopped up.  Some malaise initially but none lately.  No fever.  He is coughing.  No wheezing.  Some pain in maxillary sinus/peri-orbital regions. Mucinex and tylenol.  No nasal sprays. Former smoker: quit 2000.  Past Medical History:  Diagnosis Date  . Adenomatous colon polyp 2011  . Barrett esophagus 12/2013   EGD  . CAD (coronary artery disease)    ETT/Lexiscan-Myoview (11/14):  Normal; no ischemia, EF 67%  . CKD (chronic kidney disease), stage III ([place])   . Colon polyp 09/2010   adenoma  . COPD (chronic obstructive pulmonary disease) ([place]) 2009   spirometry with "moderate COPD"  . Depression   . Diabetes mellitus without complication ([place])   . Dyspnea   . GERD (gastroesophageal reflux disease)   . GI bleed    after polypectomy/admitted  . HTN (hypertension)   . Hypercholesterolemia   . Myocardial infarction ([place])   . Obesity   . Sleep apnea   . Spontaneous pneumothorax   . Vertigo     Past Surgical History:  Procedure Laterality Date  . APPENDECTOMY    . PLEURAL SCARIFICATION  1987  . TONSILLECTOMY    . VARICOCELECTOMY      Outpatient Medications Prior to Visit  Medication Sig Dispense Refill  . aspirin EC 81 MG tablet Take 81 mg by mouth daily.     Marland Kitchen atorvastatin (LIPITOR) 80 MG tablet Take 1 tablet (80 mg total) by mouth daily. 90 tablet 3  . buPROPion (WELLBUTRIN XL) 300 MG 24 hr tablet Take 1 tablet (300 mg total) by mouth daily. 90 tablet 1  . clopidogrel (PLAVIX) 75 MG tablet Take 1 tablet (75 mg total) by mouth daily. 15 tablet 0  . Cyanocobalamin (VITAMIN B 12 PO) Take by mouth.    . iron polysaccharides (NIFEREX) 150 MG capsule Take 1 capsule (150 mg total) by mouth daily. 30 capsule 2  . metFORMIN  (GLUCOPHAGE) 500 MG tablet Take 500 mg by mouth every other day. diabetes    . metoprolol tartrate (LOPRESSOR) 50 MG tablet Take 1 tablet (50 mg total) by mouth daily. 90 tablet 1  . mirtazapine (REMERON SOL-TAB) 30 MG disintegrating tablet Take 30 mg by mouth at bedtime. Under tongue    . Multiple Vitamin (MULTIVITAMIN) tablet Take 1 tablet by mouth daily.      . nitroGLYCERIN (NITROSTAT) 0.4 MG SL tablet Place 1 tablet (0.4 mg total) under the tongue every 5 (five) minutes as needed for chest pain (3 doses max). 25 tablet 3  . pantoprazole (PROTONIX) 40 MG tablet Take 1 tablet (40 mg total) by mouth 2 (two) times daily. 180 tablet 1  . SYMBICORT 160-4.5 MCG/ACT inhaler Inhale 2 puffs into the lungs 2 (two) times daily. 3 Inhaler 3  . tamsulosin (FLOMAX) 0.4 MG CAPS capsule TAKE 1 CAPSULE BY MOUTH ONCE AT NIGHT AT BEDTIME  11  . traZODone (DESYREL) 50 MG tablet Take 3 tablets (150 mg total) by mouth at bedtime. 270 tablet 1   No facility-administered medications prior to visit.     Allergies  Allergen Reactions  . Altace [Ramipril] Cough  . Codeine     Makes sick  . Tape  Blisters   . Zantac [Ranitidine Hcl] Dermatitis    ROS As per HPI  PE: Blood pressure (!) 134/57, pulse 63, temperature 98.7 F (37.1 C), temperature source Oral, resp. rate 16, height 5\' 10"  (1.778 m), weight 203 lb 4 oz (92.2 kg), SpO2 97 %. VS: noted--normal. Gen: alert, NAD, NONTOXIC APPEARING. HEENT: eyes without injection, drainage, or swelling.  Ears: EACs clear, TMs with normal light reflex and landmarks.  Nose: Clear rhinorrhea, with some dried, crusty exudate adherent to mildly injected mucosa.  No purulent d/c.  Mild bilat maxillary and ethmoid paranasal sinus TTP.  No facial swelling.  Throat and mouth without focal lesion.  No pharyngial swelling, erythema, or exudate.   Neck: supple, no LAD.   LUNGS: CTA bilat, nonlabored resps.   CV: RRR, no m/r/g. EXT: no c/c/e SKIN: no rash  LABS:     Chemistry      Component Value Date/Time   NA 139 01/07/2018 1041   NA 141 01/28/2017 1018   K 4.7 01/07/2018 1041   K 4.6 01/28/2017 1018   CL 102 01/07/2018 1041   CO2 31 01/07/2018 1041   CO2 26 01/28/2017 1018   BUN 24 (H) 01/07/2018 1041   BUN 20.2 01/28/2017 1018   CREATININE 1.21 01/07/2018 1041   CREATININE 1.3 01/28/2017 1018      Component Value Date/Time   CALCIUM 9.7 01/07/2018 1041   CALCIUM 9.7 01/28/2017 1018   ALKPHOS 62 01/07/2018 1041   ALKPHOS 84 01/28/2017 1018   AST 17 01/07/2018 1041   AST 18 01/28/2017 1018   ALT 31 01/07/2018 1041   ALT 33 01/28/2017 1018   BILITOT 0.8 01/07/2018 1041   BILITOT 0.79 01/28/2017 1018       IMPRESSION AND PLAN:  Acute sinusitis, with PND cough. No sign of RAD. Z-pack eRx'd. Get otc generic robitussin DM OR Mucinex DM and use as directed on the packaging for cough and congestion. Use otc generic saline nasal spray 2-3 times per day to irrigate/moisturize your nasal passages.  An After Visit Summary was printed and given to the patient.  FOLLOW UP: Return if symptoms worsen or fail to improve.  Signed:  Crissie Sickles, MD           05/18/2018

## 2018-06-19 NOTE — Progress Notes (Addendum)
Subjective:   [name] is a 72 y.o. male who presents for Medicare Annual/Subsequent preventive examination.  Review of Systems:  No ROS.  Medicare Wellness Visit. Additional risk factors are reflected in the social history.  Cardiac Risk Factors include: advanced age (>26men, >76 women);diabetes mellitus;male gender;hypertension;dyslipidemia;family history of premature cardiovascular disease   Sleep patterns: Sleeps 8 hours. Up to void several times. Takes Trazodone nightly Home Safety/Smoke Alarms: Feels safe in home. Smoke alarms in place.  Living environment; residence and Firearm Safety: Lives with wife Gilmore Laroche) in 2 story home.  Seat Belt Safety/Bike Helmet: Wears seat belt.  Male:   CCS-Colonoscopy 04/02/2016, normal. Recall 10 years.      PSA- Followed by Urology       Objective:    Vitals: BP 132/72 (BP Location: Left Arm, Patient Position: Sitting, Cuff Size: Normal)   Pulse 72   Ht 5\' 10"  (1.778 m)   Wt 201 lb 8 oz (91.4 kg)   SpO2 97%   BMI 28.91 kg/m   Body mass index is 28.91 kg/m.  Advanced Directives 06/22/2018 06/16/2017 04/05/2016 04/02/2016 02/21/2016  Does Patient Have a Medical Advance Directive? Yes Yes No No No  Type of Paramedic of [place];Living will [place];Living will - - -  Copy of [place] in Chart? No - copy requested No - copy requested - - -  Would patient like information on creating a medical advance directive? - - No - patient declined information No - patient declined information No - patient declined information    Tobacco Social History   Tobacco Use  Smoking Status Former Smoker  . Types: Cigarettes  . Last attempt to quit: 11/25/1998  . Years since quitting: 19.5  Smokeless Tobacco Never Used     Counseling given: Not Answered    Past Medical History:  Diagnosis Date  . Adenomatous colon polyp 2011  . Barrett esophagus 12/2013   EGD  . CAD (coronary  artery disease)    ETT/Lexiscan-Myoview (11/14):  Normal; no ischemia, EF 67%  . CKD (chronic kidney disease), stage III ([place])   . Colon polyp 09/2010   adenoma  . COPD (chronic obstructive pulmonary disease) ([place]) 2009   spirometry with "moderate COPD"  . Depression   . Diabetes mellitus without complication ([place])   . Dyspnea   . GERD (gastroesophageal reflux disease)   . GI bleed    after polypectomy/admitted  . HTN (hypertension)   . Hypercholesterolemia   . Myocardial infarction ([place])   . Obesity   . Sleep apnea   . Spontaneous pneumothorax   . Vertigo    Past Surgical History:  Procedure Laterality Date  . APPENDECTOMY    . PLEURAL SCARIFICATION  1987  . TONSILLECTOMY    . VARICOCELECTOMY     Family History  Problem Relation Age of Onset  . Heart disease Mother   . CVA Mother   . Coronary artery disease Father 66  . Heart disease Father   . Stroke Father   . Cancer - Other Brother   . Hypertension Unknown   . Hyperlipidemia Unknown   . Colon polyps Sister   . Cancer Sister    Social History   Socioeconomic History  . Marital status: Married    Spouse name: Not on file  . Number of children: Not on file  . Years of education: Not on file  . Highest education level: Not on file  Occupational History  .  Occupation: Furniture conservator/restorer  Social Needs  . Financial resource strain: Not on file  . Food insecurity:    Worry: Not on file    Inability: Not on file  . Transportation needs:    Medical: Not on file    Non-medical: Not on file  Tobacco Use  . Smoking status: Former Smoker    Types: Cigarettes    Last attempt to quit: 11/25/1998    Years since quitting: 19.5  . Smokeless tobacco: Never Used  Substance and Sexual Activity  . Alcohol use: No  . Drug use: No  . Sexual activity: Yes  Lifestyle  . Physical activity:    Days per week: Not on file    Minutes per session: Not on file  . Stress: Not on file  Relationships  . Social connections:    Talks on  phone: Not on file    Gets together: Not on file    Attends religious service: Not on file    Active member of club or organization: Not on file    Attends meetings of clubs or organizations: Not on file    Relationship status: Not on file  Other Topics Concern  . Not on file  Social History Narrative   Married to [place].   Retired Production designer, theatre/television/film truck.    Drinks caffeine,  Takes a daily vitamin, wears a seatbelt   Wears a bicycle helmet.    Has dentures (partial on top)   Smoke detector in the home. No firearms in the home.    Feels safe in relationships.      Outpatient Encounter Medications as of 06/22/2018  Medication Sig  . aspirin EC 81 MG tablet Take 81 mg by mouth daily.   Marland Kitchen atorvastatin (LIPITOR) 80 MG tablet Take 1 tablet (80 mg total) by mouth daily.  Marland Kitchen buPROPion (WELLBUTRIN XL) 300 MG 24 hr tablet Take 1 tablet (300 mg total) by mouth daily.  . clopidogrel (PLAVIX) 75 MG tablet Take 1 tablet (75 mg total) by mouth daily.  . Cyanocobalamin (VITAMIN B 12 PO) Take by mouth.  . iron polysaccharides (NIFEREX) 150 MG capsule Take 1 capsule (150 mg total) by mouth daily.  . metFORMIN (GLUCOPHAGE) 500 MG tablet Take 500 mg by mouth every other day. diabetes  . metoprolol tartrate (LOPRESSOR) 50 MG tablet Take 1 tablet (50 mg total) by mouth daily.  . mirtazapine (REMERON SOL-TAB) 30 MG disintegrating tablet Take 30 mg by mouth at bedtime. Under tongue  . Multiple Vitamin (MULTIVITAMIN) tablet Take 1 tablet by mouth daily.    . pantoprazole (PROTONIX) 40 MG tablet Take 1 tablet (40 mg total) by mouth 2 (two) times daily.  . SYMBICORT 160-4.5 MCG/ACT inhaler Inhale 2 puffs into the lungs 2 (two) times daily.  . traZODone (DESYREL) 50 MG tablet Take 3 tablets (150 mg total) by mouth at bedtime.  . nitroGLYCERIN (NITROSTAT) 0.4 MG SL tablet Place 1 tablet (0.4 mg total) under the tongue every 5 (five) minutes as needed for chest pain (3 doses max).  . tamsulosin (FLOMAX) 0.4 MG CAPS capsule  TAKE 1 CAPSULE BY MOUTH ONCE AT NIGHT AT BEDTIME  . [DISCONTINUED] azithromycin (ZITHROMAX) 250 MG tablet 2 tabs po qd x 1d, then 1 tab po qd x 4d   No facility-administered encounter medications on file as of 06/22/2018.     Activities of Daily Living In your present state of health, do you have any difficulty performing the following activities: 06/22/2018  Hearing? N  Vision?  N  Difficulty concentrating or making decisions? N  Walking or climbing stairs? N  Dressing or bathing? N  Doing errands, shopping? N  Preparing Food and eating ? N  Using the Toilet? N  In the past six months, have you accidently leaked urine? N  Do you have problems with loss of bowel control? N  Managing your Medications? N  Managing your Finances? N  Housekeeping or managing your Housekeeping? N  Some recent data might be hidden    Patient Care Team: [name], DO as PCP - General (Family Medicine) Brunetta Genera, MD as Consulting Physician (Hematology) Josue Hector, MD as Consulting Physician (Cardiology) Wilford Corner, MD as Consulting Physician (Gastroenterology) Melina Schools, OD (Optometry) Kathie Rhodes, MD as Consulting Physician (Urology)   Assessment:   This is a routine wellness examination for Vignesh.  Exercise Activities and Dietary recommendations Exercise limited by: None identified   Diet (meal preparation, eat out, water intake, caffeinated beverages, dairy products, fruits and vegetables): Drinks Diet soda, gatorade and water.   Breakfast: cereal; coffee Lunch: sandwich; Ensure (for diabetics) Dinner: protein and vegetables    Goals    . Patient Stated     Remain active.     . Weight (lb) < 180 lb (81.6 kg)     Lose weight by increasing activity.        Fall Risk Fall Risk  06/22/2018 06/16/2017 03/17/2017 12/25/2015  Falls in the past year? No No No No    Depression Screen PHQ 2/9 Scores 06/22/2018 01/07/2018 12/01/2017 07/10/2017  PHQ - 2 Score 2 4 0 1   PHQ- 9 Score 3 16 - 6    Cognitive Function MMSE - Mini Mental State Exam 06/22/2018 06/16/2017  Orientation to time 5 5  Orientation to Place 5 5  Registration 3 3  Attention/ Calculation 5 5  Recall 0 3  Language- name 2 objects 2 2  Language- repeat 1 1  Language- follow 3 step command 3 3  Language- read & follow direction 1 1  Write a sentence 1 1  Copy design 1 1  Total score 27 30        Immunization History  Administered Date(s) Administered  . Influenza, High Dose Seasonal PF 09/03/2016, 09/15/2017  . Pneumococcal Conjugate-13 09/03/2016  . Pneumococcal Polysaccharide-23 07/10/2017  . Tdap 01/15/2016     Screening Tests Health Maintenance  Topic Date Due  . OPHTHALMOLOGY EXAM  06/18/2018  . INFLUENZA VACCINE  06/25/2018  . HEMOGLOBIN A1C  07/07/2018  . FOOT EXAM  01/07/2019  . URINE MICROALBUMIN  01/07/2019  . COLONOSCOPY  04/02/2021  . TETANUS/TDAP  01/14/2026  . Hepatitis C Screening  Completed  . PNA vac Low Risk Adult  Completed        Plan:    Schedule eye exam.   Check on Shingles vaccine at pharmacy.   Continue doing brain stimulating activities (puzzles, reading, adult coloring books, staying active) to keep memory sharp.   Bring a copy of your living will and/or healthcare power of attorney to your next office visit.   I have personally reviewed and noted the following in the patient's chart:   . Medical and social history . Use of alcohol, tobacco or illicit drugs  . Current medications and supplements . Functional ability and status . Nutritional status . Physical activity . Advanced directives . List of other physicians . Hospitalizations, surgeries, and ER visits in previous 12 months . Vitals . Screenings  to include cognitive, depression, and falls . Referrals and appointments  In addition, I have reviewed and discussed with patient certain preventive protocols, quality metrics, and best practice recommendations. A written  personalized care plan for preventive services as well as general preventive health recommendations were provided to patient.     Gerilyn Nestle, RN  06/22/2018  PCP Notes: -Pt c/o left heel pain with swelling x 2 months.  -Pt c/o pain behind right knee "for years".  -F/U with PCP 07/01/18  Medical screening examination/treatment/procedure(s) were performed by non-physician practitioner and as supervising physician I was immediately available for consultation/collaboration.  I agree with above assessment and plan.  Electronically Signed by: Howard Pouch, DO  primary [place]

## 2018-06-22 ENCOUNTER — Ambulatory Visit (INDEPENDENT_AMBULATORY_CARE_PROVIDER_SITE_OTHER): Payer: Medicare HMO

## 2018-06-22 ENCOUNTER — Other Ambulatory Visit: Payer: Self-pay

## 2018-06-22 VITALS — BP 132/72 | HR 72 | Ht 70.0 in | Wt 201.5 lb

## 2018-06-22 DIAGNOSIS — Z0000 Encounter for general adult medical examination without abnormal findings: Secondary | ICD-10-CM | POA: Diagnosis not present

## 2018-06-22 NOTE — Patient Instructions (Addendum)
Schedule eye exam.   Check on Shingles vaccine at pharmacy.   Continue doing brain stimulating activities (puzzles, reading, adult coloring books, staying active) to keep memory sharp.   Bring a copy of your living will and/or healthcare power of attorney to your next office visit.   Health Maintenance, Male A healthy lifestyle and preventive care is important for your health and wellness. Ask your health care provider about what schedule of regular examinations is right for you. What should I know about weight and diet? Eat a Healthy Diet  Eat plenty of vegetables, fruits, whole grains, low-fat dairy products, and lean protein.  Do not eat a lot of foods high in solid fats, added sugars, or salt.  Maintain a Healthy Weight Regular exercise can help you achieve or maintain a healthy weight. You should:  Do at least 150 minutes of exercise each week. The exercise should increase your heart rate and make you sweat (moderate-intensity exercise).  Do strength-training exercises at least twice a week.  Watch Your Levels of Cholesterol and Blood Lipids  Have your blood tested for lipids and cholesterol every 5 years starting at 71 years of age. If you are at high risk for heart disease, you should start having your blood tested when you are 71 years old. You may need to have your cholesterol levels checked more often if: ? Your lipid or cholesterol levels are high. ? You are older than 71 years of age. ? You are at high risk for heart disease.  What should I know about cancer screening? Many types of cancers can be detected early and may often be prevented. Lung Cancer  You should be screened every year for lung cancer if: ? You are a current smoker who has smoked for at least 30 years. ? You are a former smoker who has quit within the past 15 years.  Talk to your health care provider about your screening options, when you should start screening, and how often you should be  screened.  Colorectal Cancer  Routine colorectal cancer screening usually begins at 71 years of age and should be repeated every 5-10 years until you are 71 years old. You may need to be screened more often if early forms of precancerous polyps or small growths are found. Your health care provider may recommend screening at an earlier age if you have risk factors for colon cancer.  Your health care provider may recommend using home test kits to check for hidden blood in the stool.  A small camera at the end of a tube can be used to examine your colon (sigmoidoscopy or colonoscopy). This checks for the earliest forms of colorectal cancer.  Prostate and Testicular Cancer  Depending on your age and overall health, your health care provider may do certain tests to screen for prostate and testicular cancer.  Talk to your health care provider about any symptoms or concerns you have about testicular or prostate cancer.  Skin Cancer  Check your skin from head to toe regularly.  Tell your health care provider about any new moles or changes in moles, especially if: ? There is a change in a mole's size, shape, or color. ? You have a mole that is larger than a pencil eraser.  Always use sunscreen. Apply sunscreen liberally and repeat throughout the day.  Protect yourself by wearing long sleeves, pants, a wide-brimmed hat, and sunglasses when outside.  What should I know about heart disease, diabetes, and high blood pressure?  If you are 74-45 years of age, have your blood pressure checked every 3-5 years. If you are 54 years of age or older, have your blood pressure checked every year. You should have your blood pressure measured twice-once when you are at a hospital or clinic, and once when you are not at a hospital or clinic. Record the average of the two measurements. To check your blood pressure when you are not at a hospital or clinic, you can use: ? An automated blood pressure machine at a  pharmacy. ? A home blood pressure monitor.  Talk to your health care provider about your target blood pressure.  If you are between 21-45 years old, ask your health care provider if you should take aspirin to prevent heart disease.  Have regular diabetes screenings by checking your fasting blood sugar level. ? If you are at a normal weight and have a low risk for diabetes, have this test once every three years after the age of 31. ? If you are overweight and have a high risk for diabetes, consider being tested at a younger age or more often.  A one-time screening for abdominal aortic aneurysm (AAA) by ultrasound is recommended for men aged 36-75 years who are current or former smokers. What should I know about preventing infection? Hepatitis B If you have a higher risk for hepatitis B, you should be screened for this virus. Talk with your health care provider to find out if you are at risk for hepatitis B infection. Hepatitis C Blood testing is recommended for:  Everyone born from [birth year] through 1964.  Anyone with known risk factors for hepatitis C.  Sexually Transmitted Diseases (STDs)  You should be screened each year for STDs including gonorrhea and chlamydia if: ? You are sexually active and are younger than 71 years of age. ? You are older than 71 years of age and your health care provider tells you that you are at risk for this type of infection. ? Your sexual activity has changed since you were last screened and you are at an increased risk for chlamydia or gonorrhea. Ask your health care provider if you are at risk.  Talk with your health care provider about whether you are at high risk of being infected with HIV. Your health care provider may recommend a prescription medicine to help prevent HIV infection.  What else can I do?  Schedule regular health, dental, and eye exams.  Stay current with your vaccines (immunizations).  Do not use any tobacco products, such as  cigarettes, chewing tobacco, and e-cigarettes. If you need help quitting, ask your health care provider.  Limit alcohol intake to no more than 2 drinks per day. One drink equals 12 ounces of beer, 5 ounces of wine, or 1 ounces of hard liquor.  Do not use street drugs.  Do not share needles.  Ask your health care provider for help if you need support or information about quitting drugs.  Tell your health care provider if you often feel depressed.  Tell your health care provider if you have ever been abused or do not feel safe at home. This information is not intended to replace advice given to you by your health care provider. Make sure you discuss any questions you have with your health care provider. Document Released: 05/09/2008 Document Revised: 07/10/2016 Document Reviewed: 08/15/2015 Elsevier Interactive Patient Education  [employer].

## 2018-07-01 ENCOUNTER — Ambulatory Visit (INDEPENDENT_AMBULATORY_CARE_PROVIDER_SITE_OTHER): Payer: Medicare HMO | Admitting: Family Medicine

## 2018-07-01 ENCOUNTER — Encounter: Payer: Self-pay | Admitting: Family Medicine

## 2018-07-01 VITALS — BP 133/84 | HR 62 | Temp 98.3°F | Resp 20 | Ht 70.0 in | Wt 202.0 lb

## 2018-07-01 DIAGNOSIS — G8929 Other chronic pain: Secondary | ICD-10-CM | POA: Diagnosis not present

## 2018-07-01 DIAGNOSIS — M7752 Other enthesopathy of left foot: Secondary | ICD-10-CM | POA: Diagnosis not present

## 2018-07-01 DIAGNOSIS — M25561 Pain in right knee: Secondary | ICD-10-CM | POA: Diagnosis not present

## 2018-07-01 NOTE — Patient Instructions (Addendum)
Make sure to schedule your appt for chronic conditions if you hae not done so already. You are overdue for diabetes/depression/hypertension etc follow up.   - short term solution: ice after use (15 min) never on direct skin (in towel). Purchase achilles tendon support ans wear with activity.   Wear the knee brace you have with activity.   NSAIDS are not ideal for you since you are on aspirin and plavix. Therefore we have referred you to sports med in [place] for other options. They will call you with appt.     Bursitis Bursitis is when the fluid-filled sac (bursa) that covers and protects a joint is swollen (inflamed). Bursitis is most common near joints, especially the knees, elbows, hips, and shoulders. Follow these instructions at home:  Take medicines only as told by your doctor.  If you were prescribed an antibiotic medicine, finish it all even if you start to feel better.  Rest the affected area as told by your doctor. ? Keep the area raised up. ? Avoid doing things that make the pain worse.  Apply ice to the injured area: ? Place ice in a plastic bag. ? Place a towel between your skin and the bag. ? Leave the ice on for 20 minutes, 2-3 times a day.  Use splints, braces, pads, or walking aids as told by your doctor.  Keep all follow-up visits as told by your doctor. This is important. Contact a doctor if:  You have more pain with home care.  You have a fever.  You have chills. This information is not intended to replace advice given to you by your health care provider. Make sure you discuss any questions you have with your health care provider. Document Released: 05/01/2010 Document Revised: 04/18/2016 Document Reviewed: 01/31/2014 Elsevier Interactive Patient Education  2018 [employer].

## 2018-07-01 NOTE — Progress Notes (Signed)
[name] , 09/09/1946, 72 y.o., male MRN: [id number] Patient Care Team    Relationship Specialty Notifications Start End  [name], DO PCP - General Family Medicine  12/25/15   Brunetta Genera, MD Consulting Physician Hematology  09/03/16   Josue Hector, MD Consulting Physician Cardiology  09/03/16   Wilford Corner, MD Consulting Physician Gastroenterology  09/03/16   Melina Schools, OD  Optometry  09/03/16   Kathie Rhodes, MD Consulting Physician Urology  09/10/16     Chief Complaint  Patient presents with  . Foot Pain    swelling left      Subjective: Pt presents for an OV with complaints of left heel pain  of 2 months duration as well as right knee pain that has been chronic for years.   Associated symptoms include pain increases after activity (working in the yard).  Pt has tried different shoes to ease their symptoms. He denies fever, chills associated with pain. He reports the knee pain is without associated redness or swelling. The heel pain he does notice swelling, but not redness. He is on asa and plavix.   Depression screen [facility] 2/9 06/22/2018 01/07/2018 12/01/2017 07/10/2017 06/16/2017  Decreased Interest 1 2 0 0 0  Down, Depressed, Hopeless 1 2 0 1 1  PHQ - 2 Score 2 4 0 1 1  Altered sleeping 0 3 - 3 -  Tired, decreased energy 0 2 - 0 -  Change in appetite 0 3 - 0 -  Feeling bad or failure about yourself  1 1 - 1 -  Trouble concentrating 0 2 - 1 -  Moving slowly or fidgety/restless 0 1 - 0 -  Suicidal thoughts 0 0 - 0 -  PHQ-9 Score 3 16 - 6 -  Difficult doing work/chores Somewhat difficult - - - -    Allergies  Allergen Reactions  . Altace [Ramipril] Cough  . Codeine     Makes sick  . Tape     Blisters   . Zantac [Ranitidine Hcl] Dermatitis   Social History   Tobacco Use  . Smoking status: Former Smoker    Types: Cigarettes    Last attempt to quit: 11/25/1998    Years since quitting: 19.6  . Smokeless tobacco: Never Used  Substance  Use Topics  . Alcohol use: No   Past Medical History:  Diagnosis Date  . Adenomatous colon polyp 2011  . Barrett esophagus 12/2013   EGD  . CAD (coronary artery disease)    ETT/Lexiscan-Myoview (11/14):  Normal; no ischemia, EF 67%  . CKD (chronic kidney disease), stage III ([place])   . Colon polyp 09/2010   adenoma  . COPD (chronic obstructive pulmonary disease) ([place]) 2009   spirometry with "moderate COPD"  . Depression   . Diabetes mellitus without complication ([place])   . Dyspnea   . GERD (gastroesophageal reflux disease)   . GI bleed    after polypectomy/admitted  . HTN (hypertension)   . Hypercholesterolemia   . Myocardial infarction ([place])   . Obesity   . Sleep apnea   . Spontaneous pneumothorax   . Vertigo    Past Surgical History:  Procedure Laterality Date  . APPENDECTOMY    . PLEURAL SCARIFICATION  1987  . TONSILLECTOMY    . VARICOCELECTOMY     Family History  Problem Relation Age of Onset  . Heart disease Mother   . CVA Mother   . Coronary artery disease Father 66  .  Heart disease Father   . Stroke Father   . Cancer - Other Brother   . Hypertension Unknown   . Hyperlipidemia Unknown   . Colon polyps Sister   . Cancer Sister    Allergies as of 07/01/2018      Reactions   Altace [ramipril] Cough   Codeine    Makes sick   Tape    Blisters   Zantac [ranitidine Hcl] Dermatitis      Medication List        Accurate as of 07/01/18 10:16 AM. Always use your most recent med list.          aspirin EC 81 MG tablet Take 81 mg by mouth daily.   atorvastatin 80 MG tablet Commonly known as:  LIPITOR Take 1 tablet (80 mg total) by mouth daily.   buPROPion 300 MG 24 hr tablet Commonly known as:  WELLBUTRIN XL Take 1 tablet (300 mg total) by mouth daily.   clopidogrel 75 MG tablet Commonly known as:  PLAVIX Take 1 tablet (75 mg total) by mouth daily.   iron polysaccharides 150 MG capsule Commonly known as:  NIFEREX Take 1 capsule (150 mg total) by  mouth daily.   metFORMIN 500 MG tablet Commonly known as:  GLUCOPHAGE Take 500 mg by mouth every other day. diabetes   metoprolol tartrate 50 MG tablet Commonly known as:  LOPRESSOR Take 1 tablet (50 mg total) by mouth daily.   mirtazapine 30 MG disintegrating tablet Commonly known as:  REMERON SOL-TAB Take 30 mg by mouth at bedtime. Under tongue   multivitamin tablet Take 1 tablet by mouth daily.   nitroGLYCERIN 0.4 MG SL tablet Commonly known as:  NITROSTAT Place 1 tablet (0.4 mg total) under the tongue every 5 (five) minutes as needed for chest pain (3 doses max).   pantoprazole 40 MG tablet Commonly known as:  PROTONIX Take 1 tablet (40 mg total) by mouth 2 (two) times daily.   SYMBICORT 160-4.5 MCG/ACT inhaler Generic drug:  budesonide-formoterol Inhale 2 puffs into the lungs 2 (two) times daily.   tamsulosin 0.4 MG Caps capsule Commonly known as:  FLOMAX TAKE 1 CAPSULE BY MOUTH ONCE AT NIGHT AT BEDTIME   traZODone 50 MG tablet Commonly known as:  DESYREL Take 3 tablets (150 mg total) by mouth at bedtime.   VITAMIN B 12 PO Take by mouth.       All past medical history, surgical history, allergies, family history, immunizations andmedications were updated in the EMR today and reviewed under the history and medication portions of their EMR.     ROS: Negative, with the exception of above mentioned in HPI   Objective:  BP 133/84 (BP Location: Left Arm, Patient Position: Sitting, Cuff Size: Large)   Pulse 62   Temp 98.3 F (36.8 C)   Resp 20   Ht 5\' 10"  (1.778 m)   Wt 202 lb (91.6 kg)   SpO2 96%   BMI 28.98 kg/m  Body mass index is 28.98 kg/m. Gen: Afebrile. No acute distress. Nontoxic in appearance, well developed, well nourished.  HENT: AT. [place].  MMM Eyes:Pupils Equal Round Reactive to light, Extraocular movements intact,  Conjunctiva without redness, discharge or icterus. MSK:     Right knee: no erythema or swelling. NROM. TTP posterior medial knee.  NV intact distally.     Left Heel: no erythema. Swelling over left retrocalcaneous bursa. Mild TTP. FROM of foot/heel. NV intact distally.  Neuro: Normal gait. PERLA. EOMi. Alert. Oriented  x3   No exam data present No results found. No results found for this or any previous visit (from the past 24 hour(s)).  Assessment/Plan: PAULO KEIMIG is a 72 y.o. male present for OV for  Postcalcaneal bursitis of left foot - discussed supportive/conservative therapy with icing, achilles support strap, good arch support etc.  - he is not a candidate for NSAIDS with ASA and plavix use. Discussed options with sports med for further eval, recs and maybe injection if appropriate. He is agreeable to this today and with start supportive conservative therapy in the meantime  - Ambulatory referral to Sports Medicine  Chronic pain of right knee - TTP over distal hamstring medially posterior knee. Suspect chronic tear/injury as possible cause, appears muscular in nature.  - continue knee support with activity.  - Ambulatory referral to Sports Medicine   Reviewed expectations re: course of current medical issues.  Discussed self-management of symptoms.  Outlined signs and symptoms indicating need for more acute intervention.  Patient verbalized understanding and all questions were answered.  Patient received an After-Visit Summary.    No orders of the defined types were placed in this encounter.    Note is dictated utilizing voice recognition software. Although note has been proof read prior to signing, occasional typographical errors still can be missed. If any questions arise, please do not hesitate to call for verification.   electronically signed by:  Howard Pouch, DO  [place]

## 2018-07-04 ENCOUNTER — Other Ambulatory Visit: Payer: Self-pay | Admitting: Family Medicine

## 2018-07-07 ENCOUNTER — Telehealth: Payer: Self-pay | Admitting: Family Medicine

## 2018-07-07 NOTE — Telephone Encounter (Signed)
Copied from [place] [id number]. Topic: Quick Communication - See Telephone Encounter >> Jul 07, 2018  3:33 PM Neva Seat wrote: Pt needing to discuss which mediciations he is needing refilled.   Pt is not sure which ones he needs to have refills on. Please call pt back to discuss.

## 2018-07-08 NOTE — Telephone Encounter (Signed)
Left message for patient he needs an appointment to follow up on chronic conditions to get medications refilled.

## 2018-07-09 ENCOUNTER — Other Ambulatory Visit: Payer: Self-pay | Admitting: Family Medicine

## 2018-07-14 ENCOUNTER — Encounter: Payer: Medicare HMO | Admitting: Family Medicine

## 2018-07-20 ENCOUNTER — Ambulatory Visit (INDEPENDENT_AMBULATORY_CARE_PROVIDER_SITE_OTHER): Payer: Medicare HMO | Admitting: Family Medicine

## 2018-07-20 ENCOUNTER — Encounter: Payer: Self-pay | Admitting: Family Medicine

## 2018-07-20 VITALS — BP 142/68 | HR 59 | Ht 70.0 in | Wt 202.0 lb

## 2018-07-20 DIAGNOSIS — M7662 Achilles tendinitis, left leg: Secondary | ICD-10-CM | POA: Insufficient documentation

## 2018-07-20 DIAGNOSIS — M7121 Synovial cyst of popliteal space [Baker], right knee: Secondary | ICD-10-CM

## 2018-07-20 DIAGNOSIS — M9260 Juvenile osteochondrosis of tarsus, unspecified ankle: Secondary | ICD-10-CM

## 2018-07-20 HISTORY — DX: Synovial cyst of popliteal space (Baker), right knee: M71.21

## 2018-07-20 HISTORY — DX: Achilles tendinitis, left leg: M76.62

## 2018-07-20 MED ORDER — NITROGLYCERIN 0.2 MG/HR TD PT24: MEDICATED_PATCH | TRANSDERMAL | 1 refills | Status: DC

## 2018-07-20 NOTE — Progress Notes (Addendum)
[name] is a 72 y.o. male who presents to [facility] today for heel and knee pain.   Patient was referred to this clinic by his primary care provider Dr. Raoul Pitch  Left heel: he has had pain in his heel at the achilles tendon insertion for about 3 months. He says the pain is constant but it is the worst when he is up on his feet all day. The heel gets swollen and painful. He has tried tylenol and says that it provides some relief. He thinks this originated when he was working outside in an old pair of shoes, but is not sure exactly how it started.  He denies any injury.  He denies any significant radiating pain or numbness.  Pain is worse with climbing stairs and going up a hill.  Right knee: He has a long history of knee pain and has received multiple injections. He says for about 1 year he has had a pain in the back on his knee that is different then the pain he was being treated for before. He says he feel a small bump in the back of his knee that is where the pain is localized. Tylenol does not help with this pain. He describes the pain as pulsing and says it is better than it was a year ago.  Patient had a steroid injection about a month and a half ago that has significantly improved his knee pain and swelling.    ROS:  No headache, visual changes, nausea, vomiting, diarrhea, constipation, dizziness, abdominal pain, skin rash, fevers, chills, night sweats, weight loss, swollen lymph nodes, body aches, joint swelling, muscle aches, chest pain, shortness of breath, mood changes, visual or auditory hallucinations.    Exam:  BP (!) 142/68   Pulse (!) 59   Ht 5\' 10"  (1.778 m)   Wt 202 lb (91.6 kg)   BMI 28.98 kg/m  General: Well Developed, well nourished, and in no acute distress.  Neuro/Psych: Alert and oriented x3, extra-ocular muscles intact, able to move all 4 extremities, sensation grossly intact. Skin: Warm and dry, no rashes noted.    Respiratory: Not using accessory muscles, speaking in full sentences, trachea midline.  Cardiovascular: Pulses palpable, no extremity edema. Abdomen: Does not appear distended. Left heel: moderate Haglund deformity. No erythema.  Mildly tender to palpation over the area of the deformity.  Mild pain with ankle flexion/extension.  2+ DP pulse  Normal strength and gait. Right knee: normal appearing with no obvious deformities.  Non tender to palpation over posteriomedial knee. No palpable nodules ROM and strength intact  2+ DP pulse and cap refill intact    Lab and Radiology Results Limited musculoskeletal ultrasound left posterior calcaneus: Normal-appearing Achilles tendon until the insertion onto the calcaneus which shows significant hypoechoic change within the distal tendon consistent appearance with calcific Achilles tendinopathy.  No significant retrocalcaneal bursitis present.  No Achilles tendon disruption present. Impression: Chronic insertional calcific Achilles tendinitis.  Limited musculoskeletal ultrasound right knee reveals no significant effusion.  Minuscule Baker's cyst present measuring 0.5 cm.    Assessment and Plan: 72 y.o. male with left heel pain and right knee pain.   Heel Pain: his heel pain is likely a haglund deformity due to chronic insertional Achilles tendinopathy with calcific changes. . Plan for eccentric exercises and nitroglycerin patch protocol.  Recheck in 6 weeks.  Return sooner if needed.  Knee pain: His pain in the posteromedial knee was suspicious for a  Bakers cyst.  Patient had significant clinical improvement prior to presentation following steroid injection.  Ultrasound shows tiny residual Baker's cyst which likely was the cause of his significant pain previously one was larger.  Plan for watchful waiting and recheck in 6 weeks.    No orders of the defined types were placed in this encounter.  Meds ordered this encounter  Medications  .  nitroGLYCERIN (NITRODUR - DOSED IN MG/24 HR) 0.2 mg/hr patch    Sig: 1/4 patch to heel daily for tendonitis    Dispense:  30 patch    Refill:  1    Historical information moved to improve visibility of documentation.  Past Medical History:  Diagnosis Date  . Adenomatous colon polyp 2011  . Barrett esophagus 12/2013   EGD  . CAD (coronary artery disease)    ETT/Lexiscan-Myoview (11/14):  Normal; no ischemia, EF 67%  . CKD (chronic kidney disease), stage III ([place])   . Colon polyp 09/2010   adenoma  . COPD (chronic obstructive pulmonary disease) ([place]) 2009   spirometry with "moderate COPD"  . Depression   . Diabetes mellitus without complication ([place])   . Dyspnea   . GERD (gastroesophageal reflux disease)   . GI bleed    after polypectomy/admitted  . HTN (hypertension)   . Hypercholesterolemia   . Myocardial infarction ([place])   . Obesity   . Sleep apnea   . Spontaneous pneumothorax   . Vertigo    Past Surgical History:  Procedure Laterality Date  . APPENDECTOMY    . PLEURAL SCARIFICATION  1987  . TONSILLECTOMY    . VARICOCELECTOMY     Social History   Tobacco Use  . Smoking status: Former Smoker    Types: Cigarettes    Last attempt to quit: 11/25/1998    Years since quitting: 19.6  . Smokeless tobacco: Never Used  Substance Use Topics  . Alcohol use: No   family history includes CVA in his mother; Cancer in his sister; Cancer - Other in his brother; Colon polyps in his sister; Coronary artery disease (age of onset: 58) in his father; Heart disease in his father and mother; Hyperlipidemia in his unknown relative; Hypertension in his unknown relative; Stroke in his father.  Medications: Current Outpatient Medications  Medication Sig Dispense Refill  . aspirin EC 81 MG tablet Take 81 mg by mouth daily.     Marland Kitchen atorvastatin (LIPITOR) 80 MG tablet Take 1 tablet (80 mg total) by mouth daily. 90 tablet 3  . buPROPion (WELLBUTRIN XL) 300 MG 24 hr tablet Take 1 tablet (300 mg  total) by mouth daily. 90 tablet 1  . clopidogrel (PLAVIX) 75 MG tablet Take 1 tablet (75 mg total) by mouth daily. 15 tablet 0  . Cyanocobalamin (VITAMIN B 12 PO) Take by mouth.    . iron polysaccharides (NIFEREX) 150 MG capsule Take 1 capsule (150 mg total) by mouth daily. 30 capsule 2  . metFORMIN (GLUCOPHAGE) 500 MG tablet Take 500 mg by mouth every other day. diabetes    . metoprolol tartrate (LOPRESSOR) 50 MG tablet Take 1 tablet (50 mg total) by mouth daily. 90 tablet 1  . mirtazapine (REMERON SOL-TAB) 30 MG disintegrating tablet Take 30 mg by mouth at bedtime. Under tongue    . Multiple Vitamin (MULTIVITAMIN) tablet Take 1 tablet by mouth daily.      . nitroGLYCERIN (NITROSTAT) 0.4 MG SL tablet Place 1 tablet (0.4 mg total) under the tongue every 5 (five) minutes as needed for  chest pain (3 doses max). 25 tablet 3  . pantoprazole (PROTONIX) 40 MG tablet Take 1 tablet (40 mg total) by mouth 2 (two) times daily. 180 tablet 1  . SYMBICORT 160-4.5 MCG/ACT inhaler Inhale 2 puffs into the lungs 2 (two) times daily. 3 Inhaler 3  . tamsulosin (FLOMAX) 0.4 MG CAPS capsule TAKE 1 CAPSULE BY MOUTH ONCE AT NIGHT AT BEDTIME  11  . traZODone (DESYREL) 50 MG tablet Take 3 tablets (150 mg total) by mouth at bedtime. 270 tablet 1  . nitroGLYCERIN (NITRODUR - DOSED IN MG/24 HR) 0.2 mg/hr patch 1/4 patch to heel daily for tendonitis 30 patch 1   No current facility-administered medications for this visit.    Allergies  Allergen Reactions  . Altace [Ramipril] Cough  . Codeine     Makes sick  . Tape     Blisters   . Zantac [Ranitidine Hcl] Dermatitis      Discussed warning signs or symptoms. Please see discharge instructions. Patient expresses understanding.  I personally was present and performed or re-performed the history, physical exam and medical decision-making activities of this service and have verified that the service and findings are accurately documented in the student's  note. ___________________________________________ Lynne Leader M.D., ABFM., CAQSM. Primary Care and Sports Medicine Adjunct Instructor of [employer]

## 2018-07-20 NOTE — Patient Instructions (Signed)
Thank you for coming in today. Use the patch.  Do the heel exercises going from up to down slowly.  Do about 30 2-3x daily.   Nitroglycerin Protocol   Apply 1/4 nitroglycerin patch to affected area daily.  Change position of patch within the affected area every 24 hours.  You may experience a headache during the first 1-2 weeks of using the patch, these should subside.  If you experience headaches after beginning nitroglycerin patch treatment, you may take your preferred over the counter pain reliever.  Another side effect of the nitroglycerin patch is skin irritation or rash related to patch adhesive.  Please notify our office if you develop more severe headaches or rash, and stop the patch.  Tendon healing with nitroglycerin patch may require 12 to 24 weeks depending on the extent of injury.  Men should not use if taking Viagra, Cialis, or Levitra.   Do not use if you have migraines or rosacea.     Achilles Tendinitis Achilles tendinitis is inflammation of the tough, cord-like band that attaches the lower leg muscles to the heel bone (Achilles tendon). This is usually caused by overusing the tendon and the ankle joint. Achilles tendinitis usually gets better over time with treatment and caring for yourself at home. It can take weeks or months to heal completely. What are the causes? This condition may be caused by:  A sudden increase in exercise or activity, such as running.  Doing the same exercises or activities (such as jumping) over and over.  Not warming up calf muscles before exercising.  Exercising in shoes that are worn out or not made for exercise.  Having arthritis or a bone growth (spur) on the back of the heel bone. This can rub against the tendon and hurt it.  Age-related wear and tear. Tendons become less flexible with age and more likely to be injured.  What are the signs or symptoms? Common symptoms of this condition include:  Pain in the Achilles  tendon or in the back of the leg, just above the heel. The pain usually gets worse with exercise.  Stiffness or soreness in the back of the leg, especially in the morning.  Swelling of the skin over the Achilles tendon.  Thickening of the tendon.  Bone spurs at the bottom of the Achilles tendon, near the heel.  Trouble standing on tiptoe.  How is this diagnosed? This condition is diagnosed based on your symptoms and a physical exam. You may have tests, including:  X-rays.  MRI.  How is this treated? The goal of treatment is to relieve symptoms and help your injury heal. Treatment may include:  Decreasing or stopping activities that caused the tendinitis. This may mean switching to low-impact exercises like biking or swimming.  Icing the injured area.  Doing physical therapy, including strengthening and stretching exercises.  NSAIDs to help relieve pain and swelling.  Using supportive shoes, wraps, heel lifts, or a walking boot (air cast).  Surgery. This may be done if your symptoms do not improve after 6 months.  Using high-energy shock wave impulses to stimulate the healing process (extracorporeal shock wave therapy). This is rare.  Injection of medicines to help relieve inflammation (corticosteroids). This is rare.  Follow these instructions at home: If you have an air cast:  Wear the cast as told by your health care provider. Remove it only as told by your health care provider.  Loosen the cast if your toes tingle, become numb, or turn cold  and blue. Activity  Gradually return to your normal activities once your health care provider approves. Do not do activities that cause pain. ? Consider doing low-impact exercises, like cycling or swimming.  If you have an air cast, ask your health care provider when it is safe for you to drive.  If physical therapy was prescribed, do exercises as told by your health care provider or physical therapist. Managing pain,  stiffness, and swelling  Raise (elevate) your foot above the level of your heart while you are sitting or lying down.  Move your toes often to avoid stiffness and to lessen swelling.  If directed, put ice on the injured area: ? Put ice in a plastic bag. ? Place a towel between your skin and the bag. ? Leave the ice on for 20 minutes, 2-3 times a day General instructions  If directed, wrap your foot with an elastic bandage or other wrap. This can help keep your tendon from moving too much while it heals. Your health care provider will show you how to wrap your foot correctly.  Wear supportive shoes or heel lifts only as told by your health care provider.  Take over-the-counter and prescription medicines only as told by your health care provider.  Keep all follow-up visits as told by your health care provider. This is important. Contact a health care provider if:  You have symptoms that gets worse.  You have pain that does not get better with medicine.  You develop new, unexplained symptoms.  You develop warmth and swelling in your foot.  You have a fever. Get help right away if:  You have a sudden popping sound or sensation in your Achilles tendon followed by severe pain.  You cannot move your toes or foot.  You cannot put any weight on your foot. Summary  Achilles tendinitis is inflammation of the tough, cord-like band that attaches the lower leg muscles to the heel bone (Achilles tendon).  This condition is usually caused by overusing the tendon and the ankle joint. It can also be caused by arthritis or normal aging.  The most common symptoms of this condition include pain, swelling, or stiffness in the Achilles tendon or in the back of the leg.  This condition is usually treated with rest, NSAIDs, and physical therapy. This information is not intended to replace advice given to you by your health care provider. Make sure you discuss any questions you have with your  health care provider. Document Released: 08/21/2005 Document Revised: 09/30/2016 Document Reviewed: 09/30/2016 Elsevier Interactive Patient Education  2017 [employer].

## 2018-08-07 ENCOUNTER — Encounter: Payer: Self-pay | Admitting: Family Medicine

## 2018-08-07 ENCOUNTER — Ambulatory Visit (INDEPENDENT_AMBULATORY_CARE_PROVIDER_SITE_OTHER): Payer: Medicare HMO | Admitting: Family Medicine

## 2018-08-07 VITALS — BP 113/76 | HR 66 | Temp 98.4°F | Resp 20 | Ht 70.0 in | Wt 200.2 lb

## 2018-08-07 DIAGNOSIS — F3341 Major depressive disorder, recurrent, in partial remission: Secondary | ICD-10-CM

## 2018-08-07 DIAGNOSIS — L659 Nonscarring hair loss, unspecified: Secondary | ICD-10-CM

## 2018-08-07 DIAGNOSIS — I10 Essential (primary) hypertension: Secondary | ICD-10-CM | POA: Diagnosis not present

## 2018-08-07 DIAGNOSIS — K219 Gastro-esophageal reflux disease without esophagitis: Secondary | ICD-10-CM

## 2018-08-07 DIAGNOSIS — E7800 Pure hypercholesterolemia, unspecified: Secondary | ICD-10-CM | POA: Diagnosis not present

## 2018-08-07 DIAGNOSIS — D500 Iron deficiency anemia secondary to blood loss (chronic): Secondary | ICD-10-CM

## 2018-08-07 DIAGNOSIS — Z23 Encounter for immunization: Secondary | ICD-10-CM

## 2018-08-07 DIAGNOSIS — E119 Type 2 diabetes mellitus without complications: Secondary | ICD-10-CM | POA: Diagnosis not present

## 2018-08-07 DIAGNOSIS — F325 Major depressive disorder, single episode, in full remission: Secondary | ICD-10-CM

## 2018-08-07 DIAGNOSIS — R233 Spontaneous ecchymoses: Secondary | ICD-10-CM | POA: Insufficient documentation

## 2018-08-07 DIAGNOSIS — R972 Elevated prostate specific antigen [PSA]: Secondary | ICD-10-CM

## 2018-08-07 DIAGNOSIS — E663 Overweight: Secondary | ICD-10-CM

## 2018-08-07 HISTORY — DX: Nonscarring hair loss, unspecified: L65.9

## 2018-08-07 LAB — LIPID PANEL
CHOLESTEROL: 124 mg/dL (ref 0–200)
HDL: 41.9 mg/dL (ref >39.00)
LDL CALC: 50 mg/dL (ref 0–99)
NonHDL: 82.43
Total CHOL/HDL Ratio: 3
Triglycerides: 162 mg/dL — ABNORMAL HIGH (ref 0.0–149.0)
VLDL: 32.4 mg/dL (ref 0.0–40.0)

## 2018-08-07 LAB — POCT GLYCOSYLATED HEMOGLOBIN (HGB A1C): HBA1C, POC (CONTROLLED DIABETIC RANGE): 6.1 % (ref 0.0–7.0)

## 2018-08-07 LAB — CBC WITH DIFFERENTIAL/PLATELET
BASOS ABS: 0 10*3/uL (ref 0.0–0.1)
BASOS PCT: 0.6 % (ref 0.0–3.0)
EOS ABS: 0.3 10*3/uL (ref 0.0–0.7)
Eosinophils Relative: 4 % (ref 0.0–5.0)
HEMATOCRIT: 37.9 % — AB (ref 39.0–52.0)
Hemoglobin: 12.8 g/dL — ABNORMAL LOW (ref 13.0–17.0)
LYMPHS ABS: 1.4 10*3/uL (ref 0.7–4.0)
LYMPHS PCT: 20.5 % (ref 12.0–46.0)
MCHC: 33.9 g/dL (ref 30.0–36.0)
MCV: 83.3 fl (ref 78.0–100.0)
MONO ABS: 0.4 10*3/uL (ref 0.1–1.0)
Monocytes Relative: 5.2 % (ref 3.0–12.0)
NEUTROS ABS: 4.8 10*3/uL (ref 1.4–7.7)
NEUTROS PCT: 69.7 % (ref 43.0–77.0)
PLATELETS: 201 10*3/uL (ref 150.0–400.0)
RBC: 4.54 Mil/uL (ref 4.22–5.81)
RDW: 15.3 % (ref 11.5–15.5)
WBC: 6.9 10*3/uL (ref 4.0–10.5)

## 2018-08-07 LAB — COMPREHENSIVE METABOLIC PANEL
ALK PHOS: 75 U/L (ref 39–117)
ALT: 28 U/L (ref 0–53)
AST: 16 U/L (ref 0–37)
Albumin: 4.3 g/dL (ref 3.5–5.2)
BUN: 18 mg/dL (ref 6–23)
CALCIUM: 9.5 mg/dL (ref 8.4–10.5)
CHLORIDE: 105 meq/L (ref 96–112)
CO2: 26 mEq/L (ref 19–32)
CREATININE: 1.25 mg/dL (ref 0.40–1.50)
GFR: 60.55 mL/min (ref >60.00)
Glucose, Bld: 144 mg/dL — ABNORMAL HIGH (ref 70–99)
POTASSIUM: 4.4 meq/L (ref 3.5–5.1)
Sodium: 142 mEq/L (ref 135–145)
TOTAL PROTEIN: 6.9 g/dL (ref 6.0–8.3)
Total Bilirubin: 0.7 mg/dL (ref 0.2–1.2)

## 2018-08-07 LAB — TSH: TSH: 1.38 u[IU]/mL (ref 0.35–4.50)

## 2018-08-07 LAB — PROTIME-INR
INR: 1 ratio (ref 0.8–1.0)
Prothrombin Time: 11.7 s (ref 9.6–13.1)

## 2018-08-07 LAB — VITAMIN D 25 HYDROXY (VIT D DEFICIENCY, FRACTURES): VITD: 55.84 ng/mL (ref 30.00–100.00)

## 2018-08-07 LAB — PSA, MEDICARE: PSA: 10.18 ng/ml — ABNORMAL HIGH (ref 0.10–4.00)

## 2018-08-07 MED ORDER — BUPROPION HCL ER (XL) 300 MG PO TB24: 300.0000 mg | ORAL_TABLET | Freq: Every day | ORAL | 1 refills | Status: DC

## 2018-08-07 MED ORDER — TRAZODONE HCL 50 MG PO TABS: 150.0000 mg | ORAL_TABLET | Freq: Every day | ORAL | 1 refills | Status: DC

## 2018-08-07 MED ORDER — MIRTAZAPINE 30 MG PO TBDP: 30.0000 mg | ORAL_TABLET | Freq: Every day | ORAL | 1 refills | Status: DC

## 2018-08-07 MED ORDER — PANTOPRAZOLE SODIUM 40 MG PO TBEC: 40.0000 mg | DELAYED_RELEASE_TABLET | Freq: Two times a day (BID) | ORAL | 3 refills | Status: DC

## 2018-08-07 MED ORDER — METFORMIN HCL 500 MG PO TABS: 500.0000 mg | ORAL_TABLET | ORAL | 5 refills | Status: DC

## 2018-08-07 MED ORDER — ATORVASTATIN CALCIUM 80 MG PO TABS: 80.0000 mg | ORAL_TABLET | Freq: Every day | ORAL | 3 refills | Status: DC

## 2018-08-07 MED ORDER — SYMBICORT 160-4.5 MCG/ACT IN AERO: 2.0000 | INHALATION_SPRAY | Freq: Two times a day (BID) | RESPIRATORY_TRACT | 3 refills | Status: DC

## 2018-08-07 MED ORDER — METOPROLOL TARTRATE 50 MG PO TABS: 50.0000 mg | ORAL_TABLET | Freq: Every day | ORAL | 1 refills | Status: DC

## 2018-08-07 NOTE — Progress Notes (Signed)
[name] , Apr 21, 1946, 72 y.o., male MRN: [id number] Patient Care Team    Relationship Specialty Notifications Start End  [name], DO PCP - General Family Medicine  12/25/15   Brunetta Genera, MD Consulting Physician Hematology  09/03/16   Josue Hector, MD Consulting Physician Cardiology  09/03/16   Wilford Corner, MD Consulting Physician Gastroenterology  09/03/16   Melina Schools, OD  Optometry  09/03/16   Kathie Rhodes, MD Consulting Physician Urology  09/10/16     Chief Complaint  Patient presents with  . Diabetes  . Depression    Subjective:  Recurrent major depressive disorder, in partial remission [facility]) Patient feels that his depression  Is well controlled. Current medication regimen of trazodone, Remeron and Wellbutrin. He denies negative side effects.  Gastroesophageal reflux disease, esophagitis presence not specified  EGD 03/2016 by Dr. Michail Sermon with signs of barrett's esophagus. Patient was tried on lower dose medication, and was unable to tolerate secondary to return of symptoms.  Refills are needed today, medication is working well.   Hair loss/rash:  - he complains of diffuse thinning hair over the last few months. He feels this is abrupt. He admits it is majority top part of head, but has had thinning all over. He has sig h/o IDA, long term use of PPI. He has also noticed a "rash" the last week on his right shin. The rash is not itchy. Initially he said it was red and slightly raised. Did not appear to be blisters. It has mildly improved, but not by much. He has not tried anything. He denies any injury or exposure to insects etc.   COPD ([place]) He uses Symbicort daily, Spiriva needed during certain times of the year only. His pneumonia series is completed.   Atherosclerosis of native coronary artery of native heart without angina pectoris/Essential hypertension/HYPERCHOLESTEROLEMIA/History of MI (myocardial infarction) Pt reports compliance with  metoprolol 50 mg daily. Blood pressures ranges at home are not routinely checked.Patient denies chest pain, shortness of breath, dizziness or lower extremity edema. Pt takes a daily baby ASA and Plavix. Pt is  prescribed statin and followed by Dr. Johnsie Cancel Cardiology. BMP: 01/07/2018 GFR 62- (baseline  55-65) CBC: 01/28/2017, within normal limits, with normal iron panel. (h/o IDA with iron transfusions) Lipids: 07/10/2017 within normal limits.--> collected today Diet: Low-sodium RF: Hypertension, hyperlipidemia, diabetes  Type 2 diabetes mellitus without complication, without long-term current use of insulin ([place]) patient reports compliance with metformin 500 mg QOD. Patient denies dizziness, hyperglycemic or hypoglycemic events. Patient denies numbness, tingling in the extremities or nonhealing wounds of feet.   Pt reports BG ranges not routinely checked. - Foot exam: 01/07/2018 - eye exam: 06/18/2017--> he is looking for new ophth - PNA: Series completed - Flu: 08/07/18 completed, encouraged yearly - Urine Microalbumin w/creat. Ratio: 01/07/2018   Depression screen [facility] 2/9 08/07/2018 06/22/2018 01/07/2018 12/01/2017 07/10/2017  Decreased Interest 0 1 2 0 0  Down, Depressed, Hopeless 0 1 2 0 1  PHQ - 2 Score 0 2 4 0 1  Altered sleeping 1 0 3 - 3  Tired, decreased energy 0 0 2 - 0  Change in appetite 0 0 3 - 0  Feeling bad or failure about yourself  0 1 1 - 1  Trouble concentrating 1 0 2 - 1  Moving slowly or fidgety/restless 0 0 1 - 0  Suicidal thoughts 0 0 0 - 0  PHQ-9 Score 2 3 16  - 6  Difficult  doing work/chores Not difficult at all Somewhat difficult - - -    Allergies  Allergen Reactions  . Altace [Ramipril] Cough  . Codeine     Makes sick  . Tape     Blisters   . Zantac [Ranitidine Hcl] Dermatitis   Social History   Tobacco Use  . Smoking status: Former Smoker    Types: Cigarettes    Last attempt to quit: 11/25/1998    Years since quitting: 19.7  . Smokeless tobacco: Never  Used  Substance Use Topics  . Alcohol use: No   Past Medical History:  Diagnosis Date  . Adenomatous colon polyp 2011  . Barrett esophagus 12/2013   EGD  . CAD (coronary artery disease)    ETT/Lexiscan-Myoview (11/14):  Normal; no ischemia, EF 67%  . CKD (chronic kidney disease), stage III ([place])   . Colon polyp 09/2010   adenoma  . COPD (chronic obstructive pulmonary disease) ([place]) 2009   spirometry with "moderate COPD"  . Depression   . Diabetes mellitus without complication ([place])   . Dyspnea   . GERD (gastroesophageal reflux disease)   . GI bleed    after polypectomy/admitted  . HTN (hypertension)   . Hypercholesterolemia   . Myocardial infarction ([place])   . Obesity   . Sleep apnea   . Spontaneous pneumothorax   . Vertigo    Past Surgical History:  Procedure Laterality Date  . APPENDECTOMY    . PLEURAL SCARIFICATION  1987  . TONSILLECTOMY    . VARICOCELECTOMY     Family History  Problem Relation Age of Onset  . Heart disease Mother   . CVA Mother   . Coronary artery disease Father 58  . Heart disease Father   . Stroke Father   . Cancer - Other Brother   . Hypertension Unknown   . Hyperlipidemia Unknown   . Colon polyps Sister   . Cancer Sister    Allergies as of 08/07/2018      Reactions   Altace [ramipril] Cough   Codeine    Makes sick   Tape    Blisters   Zantac [ranitidine Hcl] Dermatitis      Medication List        Accurate as of 08/07/18 10:43 AM. Always use your most recent med list.          aspirin EC 81 MG tablet Take 81 mg by mouth daily.   atorvastatin 80 MG tablet Commonly known as:  LIPITOR Take 1 tablet (80 mg total) by mouth daily.   buPROPion 300 MG 24 hr tablet Commonly known as:  WELLBUTRIN XL Take 1 tablet (300 mg total) by mouth daily.   clopidogrel 75 MG tablet Commonly known as:  PLAVIX Take 1 tablet (75 mg total) by mouth daily.   iron polysaccharides 150 MG capsule Commonly known as:  NIFEREX Take 1 capsule  (150 mg total) by mouth daily.   metFORMIN 500 MG tablet Commonly known as:  GLUCOPHAGE Take 500 mg by mouth every other day. diabetes   metoprolol tartrate 50 MG tablet Commonly known as:  LOPRESSOR Take 1 tablet (50 mg total) by mouth daily.   mirtazapine 30 MG disintegrating tablet Commonly known as:  REMERON SOL-TAB Take 30 mg by mouth at bedtime. Under tongue   multivitamin tablet Take 1 tablet by mouth daily.   nitroGLYCERIN 0.4 MG SL tablet Commonly known as:  NITROSTAT Place 1 tablet (0.4 mg total) under the tongue every 5 (five) minutes as  needed for chest pain (3 doses max).   nitroGLYCERIN 0.2 mg/hr patch Commonly known as:  NITRODUR - Dosed in mg/24 hr 1/4 patch to heel daily for tendonitis   pantoprazole 40 MG tablet Commonly known as:  PROTONIX Take 1 tablet (40 mg total) by mouth 2 (two) times daily.   SYMBICORT 160-4.5 MCG/ACT inhaler Generic drug:  budesonide-formoterol Inhale 2 puffs into the lungs 2 (two) times daily.   tamsulosin 0.4 MG Caps capsule Commonly known as:  FLOMAX TAKE 1 CAPSULE BY MOUTH ONCE AT NIGHT AT BEDTIME   traZODone 50 MG tablet Commonly known as:  DESYREL Take 3 tablets (150 mg total) by mouth at bedtime.   VITAMIN B 12 PO Take by mouth.       No results found for this or any previous visit (from the past 24 hour(s)). No results found.   ROS: Negative, with the exception of above mentioned in HPI   Objective:  BP 113/76 (BP Location: Left Arm, Patient Position: Sitting, Cuff Size: Large)   Pulse 66   Temp 98.4 F (36.9 C)   Resp 20   Ht 5\' 10"  (1.778 m)   Wt 200 lb 4 oz (90.8 kg)   SpO2 97%   BMI 28.73 kg/m  Body mass index is 28.73 kg/m. Gen: Afebrile. No acute distress. Overweight, pleasant, caucasian male.  HENT: AT. [place]. MMM.  Eyes:Pupils Equal Round Reactive to light, Extraocular movements intact,  Conjunctiva without redness, discharge or icterus. Neck/lymp/endocrine: Supple,no lymphadenopathy, no  thyromegaly CV: RRR no murmur, no edema, +2/4 P posterior tibialis pulses Chest: CTAB, no wheeze or crackles Abd: Soft. NTND. BS present. no Masses palpated.  Skin: red, non-blanching red rash right shin, purpura or petechiae.  Neuro: Normal gait. PERLA. EOMi. Alert. Oriented x3  Psych: Normal affect, dress and demeanor. Normal speech. Normal thought content and judgment.   Results for orders placed or performed in visit on 08/07/18 (from the past 48 hour(s))  POCT glycosylated hemoglobin (Hb A1C)     Status: Abnormal   Collection Time: 08/07/18 10:47 AM  Result Value Ref Range   Hemoglobin A1C     HbA1c POC (<> result, manual entry)     HbA1c, POC (prediabetic range)     HbA1c, POC (controlled diabetic range) 6.1 0.0 - 7.0 %    Assessment/Plan: MICHOEL KUNIN is a 72 y.o. male present for OV for  Recurrent major depressive disorder, in partial remission ([place]) - Stable. Continue current regimen, refills provided today - Continue  Remeron to 30 mg QHS.  - continue trazodone 2-3 tabs QHS PRN - Continue wellbutrin 300 QD.  - Follow-up 4-5 months  Gastroesophageal reflux disease, esophagitis presence not specified - stable. Continue  Protonix 40 mg twice a day, was unable to take back secondary to recurrence of symptoms. Refills provided today - B 12, vitamin D and magnesium levels collected at least every other year. - pantoprazole (PROTONIX) 40 MG tablet; Take 1 tablet (40 mg total) by mouth 2 (two) times daily.  Dispense: 180 tablet; Refill: 1  Chronic bronchitis, unspecified chronic bronchitis type ([place]) - Stable. Continue Symbicort. Can hold on Spiriva if feeling okay on current regimen. May need to add the Spiriva during colder months if that is a trigger for him  Atherosclerosis of native coronary artery of native heart without angina pectoris/Essential hypertension/HYPERCHOLESTEROLEMIA/History of MI (myocardial infarction) Stage 3 chronic kidney disease - following with cards,  Dr. Johnsie Cancel. - BP stable continue current regimen.  - Continue/refilled  statin, metoprolol - ASA and plavix managed by cardio - RF control of DM and HTN are both stable.  - low sodium diet and exercise encouraged.   Type 2 diabetes mellitus without complication, without long-term current use of insulin ([facility]) - A1c today 5.8--> 6.0-->6.1 today. Continue metformin every other day. Refilled.  - Foot exam: 01/07/2018 - eye exam: 06/18/2017--> looking for new ophth - PNA: series completed 2018 - Flu: 08/07/18 completed, encouraged yearly - Urine Microalbumin w/creat. Ratio: 01/07/2018 - F/U 4- 12months.  Immunization due - Flu vaccine HIGH DOSE PF (Fluzone High dose)  Iron deficiency anemia due to chronic blood loss/Hair loss/Petechial rash Hair loss may be age related- thinning is mostly in the male pattern baldness, but he also does have diffuse thinning as well. He is on chronic high dose PPI and has had IDA in the past.  - rash appears petechial It is possible it is from an unknown  Injury. The rash is no located on other extremity or any other location. No other systemic red flags (other than may be associated with hair thinning).  - consider vasculitis as cause, but would expect to be in more than one small location. Wait for results of cbc, cmp, pt/inr and consider further work on focused on vasculitis if appropriate - Vitamin D (25 hydroxy) - CBC w/Diff, CMP - INR/PT  Elevated prostate specific antigen (PSA) - does not sound like he has followed with Uro since 2017. He reports elevation was from post-surgical inability to void cause severe bladder distention.  - will collect to day to ensure normal. If abnl he should return to uro.  - PSA, Medicare    Reviewed expectations re: course of current medical issues.  Discussed self-management of symptoms.  Outlined signs and symptoms indicating need for more acute intervention.  Patient verbalized understanding and all questions were  answered.  Patient received an After-Visit Summary.   electronically signed by:  Howard Pouch, DO  [place]

## 2018-08-07 NOTE — Patient Instructions (Signed)
I am glad you are doing well.  I have refilled all your medications.  Followup in 4-5 months all chronic medical conditions.   Please help [place] help you:  We are honored you have chosen [facility] for your Primary Care home. Below you will find basic instructions that you may need to access in the future. Please help [place] help you by reading the instructions, which cover many of the frequent questions we experience.   Prescription refills and request:  -In order to allow more efficient response time, please call your pharmacy for all refills. They will forward the request electronically to [place]. This allows for the quickest possible response. Request left on a nurse line can take longer to refill, since these are checked as time allows between office patients and other phone calls.  - refill request can take up to 3-5 working days to complete.  - If request is sent electronically and request is appropiate, it is usually completed in 1-2 business days.  - all patients will need to be seen routinely for all chronic medical conditions requiring prescription medications (see follow-up below). If you are overdue for follow up on your condition, you will be asked to make an appointment and we will call in enough medication to cover you until your appointment (up to 30 days).  - all controlled substances will require a face to face visit to request/refill.  - if you desire your prescriptions to go through a new pharmacy, and have an active script at original pharmacy, you will need to call your pharmacy and have scripts transferred to new pharmacy. This is completed between the pharmacy locations and not by your provider.    Results: If any images or labs were ordered, it can take up to 1 week to get results depending on the test ordered and the lab/facility running and resulting the test. - Normal or stable results, which do not need further discussion, may be released to your mychart immediately with  attached note to you. A call may not be generated for normal results. Please make certain to sign up for mychart. If you have questions on how to activate your mychart you can call the front office.  - If your results need further discussion, our office will attempt to contact you via phone, and if unable to reach you after 2 attempts, we will release your abnormal result to your mychart with instructions.  - All results will be automatically released in mychart after 1 week.  - Your provider will provide you with explanation and instruction on all relevant material in your results. Please keep in mind, results and labs may appear confusing or abnormal to the untrained eye, but it does not mean they are actually abnormal for you personally. If you have any questions about your results that are not covered, or you desire more detailed explanation than what was provided, you should make an appointment with your provider to do so.   Our office handles many outgoing and incoming calls daily. If we have not contacted you within 1 week about your results, please check your mychart to see if there is a message first and if not, then contact our office.  In helping with this matter, you help decrease call volume, and therefore allow [place] to be able to respond to patients needs more efficiently.   Acute office visits (sick visit):  An acute visit is intended for a new problem and are scheduled in shorter time slots  to allow schedule openings for patients with new problems. This is the appropriate visit to discuss a new problem. Problems will not be addressed by phone call or Echart message. Appointment is needed if requesting treatment. In order to provide you with excellent quality medical care with proper time for you to explain your problem, have an exam and receive treatment with instructions, these appointments should be limited to one new problem per visit. If you experience a new problem, in which you desire to  be addressed, please make an acute office visit, we save openings on the schedule to accommodate you. Please do not save your new problem for any other type of visit, let us take care of it properly and quickly for you.   Follow up visits:  Depending on your condition(s) your provider will need to see you routinely in order to provide you with quality care and prescribe medication(s). Most chronic conditions (Example: hypertension, Diabetes, depression/anxiety... etc), require visits a couple times a year. Your provider will instruct you on proper follow up for your personal medical conditions and history. Please make certain to make follow up appointments for your condition as instructed. Failing to do so could result in lapse in your medication treatment/refills. If you request a refill, and are overdue to be seen on a condition, we will always provide you with a 30 day script (once) to allow you time to schedule.    Medicare wellness (well visit): - we have a wonderful Nurse Maudie Mercury), that will meet with you and provide you will yearly medicare wellness visits. These visits should occur yearly (can not be scheduled less than 1 calendar year apart) and cover preventive health, immunizations, advance directives and screenings you are entitled to yearly through your medicare benefits. Do not miss out on your entitled benefits, this is when medicare will pay for these benefits to be ordered for you.  These are strongly encouraged by your provider and is the appropriate type of visit to make certain you are up to date with all preventive health benefits. If you have not had your medicare wellness exam in the last 12 months, please make certain to schedule one by calling the office and schedule your medicare wellness with Maudie Mercury as soon as possible.   Yearly physical (well visit):  - Adults are recommended to be seen yearly for physicals. Check with your insurance and date of your last physical, most insurances  require one calendar year between physicals. Physicals include all preventive health topics, screenings, medical exam and labs that are appropriate for gender/age and history. You may have fasting labs needed at this visit. This is a well visit (not a sick visit), new problems should not be covered during this visit (see acute visit).  - Pediatric patients are seen more frequently when they are younger. Your provider will advise you on well child visit timing that is appropriate for your their age. - This is not a medicare wellness visit. Medicare wellness exams do not have an exam portion to the visit. Some medicare companies allow for a physical, some do not allow a yearly physical. If your medicare allows a yearly physical you can schedule the medicare wellness with our nurse Maudie Mercury and have your physical with your provider after, on the same day. Please check with insurance for your full benefits.   Late Policy/No Shows:  - all new patients should arrive 15-30 minutes earlier than appointment to allow [place] time  to  obtain all personal demographics,  insurance information and for you to complete office paperwork. - All established patients should arrive 10-15 minutes earlier than appointment time to update all information and be checked in .  - In our best efforts to run on time, if you are late for your appointment you will be asked to either reschedule or if able, we will work you back into the schedule. There will be a wait time to work you back in the schedule,  depending on availability.  - If you are unable to make it to your appointment as scheduled, please call 24 hours ahead of time to allow [place] to fill the time slot with someone else who needs to be seen. If you do not cancel your appointment ahead of time, you may be charged a no show fee.

## 2018-08-08 LAB — IRON,TIBC AND FERRITIN PANEL
%SAT: 23 % (ref 20–48)
FERRITIN: 162 ng/mL (ref 24–380)
Iron: 64 ug/dL (ref 50–180)
TIBC: 273 mcg/dL (calc) (ref 250–425)

## 2018-08-10 ENCOUNTER — Telehealth: Payer: Self-pay | Admitting: Family Medicine

## 2018-08-10 DIAGNOSIS — R972 Elevated prostate specific antigen [PSA]: Secondary | ICD-10-CM

## 2018-08-10 NOTE — Telephone Encounter (Signed)
Pt retuning call and given results and recommendations per notes of Dr. Raoul Pitch. Pt verbalized understanding.

## 2018-08-10 NOTE — Telephone Encounter (Signed)
Please inform patient the following information: His labs are stable, with the exception of elevated PSA >10 (higher than in the past). I have referred him back to his urologist for evaluation.  He is mildly anemic again as well. He should make sure to continue the iron supplement.   For rash: if not resolved in 2 weeks, follow up for further evaluation and work up. Can use OTC hydrocortisone cream until then.

## 2018-08-10 NOTE — Telephone Encounter (Signed)
LMOM for pt to CB to discuss.  Okay for PEC to release results.

## 2018-08-11 ENCOUNTER — Other Ambulatory Visit: Payer: Self-pay | Admitting: *Deleted

## 2018-08-11 MED ORDER — MIRTAZAPINE 30 MG PO TBDP: 30.0000 mg | ORAL_TABLET | Freq: Every day | ORAL | 1 refills | Status: DC

## 2018-08-17 DIAGNOSIS — R972 Elevated prostate specific antigen [PSA]: Secondary | ICD-10-CM | POA: Diagnosis not present

## 2018-08-17 DIAGNOSIS — R350 Frequency of micturition: Secondary | ICD-10-CM | POA: Diagnosis not present

## 2018-08-17 DIAGNOSIS — N401 Enlarged prostate with lower urinary tract symptoms: Secondary | ICD-10-CM | POA: Diagnosis not present

## 2018-08-19 ENCOUNTER — Telehealth: Payer: Self-pay

## 2018-08-19 NOTE — Telephone Encounter (Signed)
   [facility] Pre-operative Risk Assessment    Request for surgical clearance:  1. What type of surgery is being performed?  Prostate Biopsy   2. When is this surgery scheduled?  09/08/18   3. What type of clearance is required (medical clearance vs. Pharmacy clearance to hold med vs. Both)?  both  4. Are there any medications that need to be held prior to surgery and how long? Plavix and Aspirin 5 days   5. Practice name and name of physician performing surgery?  [facility]/ Dr Karsten Ro   6. What is your office phone number [phone]    7.   What is your office fax number [phone]  8.   Anesthesia type (None, local, MAC, general) ?     Legrand Como  Layton Tappan 08/19/2018, 4:08 PM  _________________________________________________________________   (provider comments below)

## 2018-08-20 NOTE — Telephone Encounter (Signed)
Ok to hold plavix and asa

## 2018-08-20 NOTE — Telephone Encounter (Signed)
See attached for surgical clearance.

## 2018-08-20 NOTE — Telephone Encounter (Signed)
   Primary Cardiologist: Jenkins Rouge, MD  Chart reviewed as part of pre-operative protocol coverage. Patient was contacted 08/20/2018 in reference to pre-operative risk assessment for pending surgery as outlined below.  [name] was last seen on 04/27/18 by Truitt Merle, NP for CAD and prior stent to LAD.Marland Kitchen  Since that day, [name] has done well without chest pain or SOB.  Can meet 4 METS.   Therefore, based on ACC/AHA guidelines, the patient would be at acceptable risk for the planned procedure without further cardiovascular testing.   Will check with Dr. Johnsie Cancel if OK to hold Asprin and plavix.    I will route this recommendation to the requesting party via Epic fax function and remove from pre-op pool.  Please call with questions.  Cecilie Kicks, NP 08/20/2018, 2:47 PM

## 2018-08-31 ENCOUNTER — Ambulatory Visit (INDEPENDENT_AMBULATORY_CARE_PROVIDER_SITE_OTHER): Payer: Medicare HMO | Admitting: Family Medicine

## 2018-08-31 VITALS — BP 147/73 | HR 60 | Ht 70.0 in | Wt 201.0 lb

## 2018-08-31 DIAGNOSIS — M1731 Unilateral post-traumatic osteoarthritis, right knee: Secondary | ICD-10-CM | POA: Diagnosis not present

## 2018-08-31 DIAGNOSIS — M25561 Pain in right knee: Secondary | ICD-10-CM | POA: Diagnosis not present

## 2018-08-31 DIAGNOSIS — M7662 Achilles tendinitis, left leg: Secondary | ICD-10-CM | POA: Diagnosis not present

## 2018-08-31 DIAGNOSIS — M9260 Juvenile osteochondrosis of tarsus, unspecified ankle: Secondary | ICD-10-CM | POA: Diagnosis not present

## 2018-08-31 MED ORDER — DICLOFENAC SODIUM 1 % TD GEL: 4.0000 g | Freq: Four times a day (QID) | TRANSDERMAL | 11 refills | Status: DC

## 2018-08-31 NOTE — Progress Notes (Signed)
[name] is a 72 y.o. male who presents to [facility] today for follow-up left heel pain.  [name] was seen about 6 weeks ago for left insertional Achilles tendinopathy with Haglund deformity.  He had a trial of home exercise program and nitroglycerin patch protocol.  He notes near complete resolution of symptoms.  He feels well and is no longer using nitroglycerin.  He continues his home exercise program a bit.  He does note some continued right knee pain.  He has a history of right knee DJD with Baker's cyst.  He notes a little bit of right medial knee pain but denies significant knee swelling.  He notes pain is worse with activity.  He has not had a trial of diclofenac gel.    ROS:  As above  Exam:  BP (!) 147/73   Pulse 60   Ht 5\' 10"  (1.778 m)   Wt 201 lb (91.2 kg)   BMI 28.84 kg/m  General: Well Developed, well nourished, and in no acute distress.  Neuro/Psych: Alert and oriented x3, extra-ocular muscles intact, able to move all 4 extremities, sensation grossly intact. Skin: Warm and dry, no rashes noted.  Respiratory: Not using accessory muscles, speaking in full sentences, trachea midline.  Cardiovascular: Pulses palpable, no extremity edema. Abdomen: Does not appear distended. MSK:  Right knee: Mature scar at the medial knee with mild medial bossing.  No significant effusion otherwise normal-appearing. Range of motion 0-120 degrees with retropatellar crepitations. Mildly tender to palpation medial joint line. Stable ligamentous exam.  Left calcaneus: Slightly swollen at the posterior aspect of the calcaneus without significant erythema or tenderness.  Normal ankle motion.  Strength is intact.      Assessment and Plan: 72 y.o. male with  Right knee pain: DJD.  Plan for diclofenac gel and home exercise program.  Recheck as needed.  Left calcaneus pain: Insertional Achilles tendinopathy with Haglund deformity.  Continue  home exercise program.  Watchful waiting return as needed.    No orders of the defined types were placed in this encounter.  Meds ordered this encounter  Medications  . diclofenac sodium (VOLTAREN) 1 % GEL    Sig: Apply 4 g topically 4 (four) times daily. To affected joint.    Dispense:  100 g    Refill:  11    Tried and failed ibuprofen and aleve. Knee OA    Historical information moved to improve visibility of documentation.  Past Medical History:  Diagnosis Date  . Adenomatous colon polyp 2011  . Barrett esophagus 12/2013   EGD  . CAD (coronary artery disease)    ETT/Lexiscan-Myoview (11/14):  Normal; no ischemia, EF 67%  . CKD (chronic kidney disease), stage III ([place])   . Colon polyp 09/2010   adenoma  . COPD (chronic obstructive pulmonary disease) ([place]) 2009   spirometry with "moderate COPD"  . Depression   . Diabetes mellitus without complication ([place])   . Dyspnea   . GERD (gastroesophageal reflux disease)   . GI bleed    after polypectomy/admitted  . HTN (hypertension)   . Hypercholesterolemia   . Myocardial infarction ([place])   . Obesity   . Sleep apnea   . Spontaneous pneumothorax   . Vertigo    Past Surgical History:  Procedure Laterality Date  . APPENDECTOMY    . PLEURAL SCARIFICATION  1987  . TONSILLECTOMY    . VARICOCELECTOMY     Social History   Tobacco Use  .  Smoking status: Former Smoker    Types: Cigarettes    Last attempt to quit: 11/25/1998    Years since quitting: 19.7  . Smokeless tobacco: Never Used  Substance Use Topics  . Alcohol use: No   family history includes CVA in his mother; Cancer in his sister; Cancer - Other in his brother; Colon polyps in his sister; Coronary artery disease (age of onset: 38) in his father; Heart disease in his father and mother; Hyperlipidemia in his unknown relative; Hypertension in his unknown relative; Stroke in his father.  Medications: Current Outpatient Medications  Medication Sig Dispense Refill  .  aspirin EC 81 MG tablet Take 81 mg by mouth daily.     Marland Kitchen atorvastatin (LIPITOR) 80 MG tablet Take 1 tablet (80 mg total) by mouth daily. 90 tablet 3  . buPROPion (WELLBUTRIN XL) 300 MG 24 hr tablet Take 1 tablet (300 mg total) by mouth daily. 90 tablet 1  . clopidogrel (PLAVIX) 75 MG tablet Take 1 tablet (75 mg total) by mouth daily. 15 tablet 0  . Cyanocobalamin (VITAMIN B 12 PO) Take by mouth.    . iron polysaccharides (NIFEREX) 150 MG capsule Take 1 capsule (150 mg total) by mouth daily. 30 capsule 2  . metFORMIN (GLUCOPHAGE) 500 MG tablet Take 1 tablet (500 mg total) by mouth every other day. diabetes 45 tablet 5  . metoprolol tartrate (LOPRESSOR) 50 MG tablet Take 1 tablet (50 mg total) by mouth daily. 90 tablet 1  . mirtazapine (REMERON SOL-TAB) 30 MG disintegrating tablet Take 1 tablet (30 mg total) by mouth at bedtime. On tongue 90 tablet 1  . Multiple Vitamin (MULTIVITAMIN) tablet Take 1 tablet by mouth daily.      . nitroGLYCERIN (NITRODUR - DOSED IN MG/24 HR) 0.2 mg/hr patch 1/4 patch to heel daily for tendonitis 30 patch 1  . nitroGLYCERIN (NITROSTAT) 0.4 MG SL tablet Place 1 tablet (0.4 mg total) under the tongue every 5 (five) minutes as needed for chest pain (3 doses max). 25 tablet 3  . pantoprazole (PROTONIX) 40 MG tablet Take 1 tablet (40 mg total) by mouth 2 (two) times daily. 180 tablet 3  . SYMBICORT 160-4.5 MCG/ACT inhaler Inhale 2 puffs into the lungs 2 (two) times daily. 3 Inhaler 3  . tamsulosin (FLOMAX) 0.4 MG CAPS capsule TAKE 1 CAPSULE BY MOUTH ONCE AT NIGHT AT BEDTIME  11  . traZODone (DESYREL) 50 MG tablet Take 3 tablets (150 mg total) by mouth at bedtime. 270 tablet 1  . diclofenac sodium (VOLTAREN) 1 % GEL Apply 4 g topically 4 (four) times daily. To affected joint. 100 g 11   No current facility-administered medications for this visit.    Allergies  Allergen Reactions  . Altace [Ramipril] Cough  . Codeine     Makes sick  . Tape     Blisters   . Zantac  [Ranitidine Hcl] Dermatitis      Discussed warning signs or symptoms. Please see discharge instructions. Patient expresses understanding.

## 2018-08-31 NOTE — Patient Instructions (Addendum)
Thank you for coming in today. Continue the home exercises for the heel.  Apply the diclofenac gel to the knee up to 4x daily for pain as needed.  Recheck with me as needed.   You could also use aspercream on the knee.

## 2018-09-11 DIAGNOSIS — R972 Elevated prostate specific antigen [PSA]: Secondary | ICD-10-CM | POA: Diagnosis not present

## 2018-09-17 DIAGNOSIS — N401 Enlarged prostate with lower urinary tract symptoms: Secondary | ICD-10-CM | POA: Diagnosis not present

## 2018-09-17 DIAGNOSIS — R972 Elevated prostate specific antigen [PSA]: Secondary | ICD-10-CM | POA: Diagnosis not present

## 2018-09-17 DIAGNOSIS — R3915 Urgency of urination: Secondary | ICD-10-CM | POA: Diagnosis not present

## 2018-09-18 ENCOUNTER — Encounter: Payer: Self-pay | Admitting: *Deleted

## 2018-12-21 ENCOUNTER — Ambulatory Visit: Payer: Medicare HMO | Admitting: Family Medicine

## 2018-12-21 DIAGNOSIS — Z0289 Encounter for other administrative examinations: Secondary | ICD-10-CM

## 2019-01-27 ENCOUNTER — Other Ambulatory Visit: Payer: Self-pay | Admitting: Family Medicine

## 2019-01-27 NOTE — Telephone Encounter (Signed)
Received request for pt metoprolol and Remeron. He is overdue for follow up. Please schedule him for Chronic medical conditions follow up and we can extend him a 30 day script if needed to give him time to get in.

## 2019-01-28 NOTE — Telephone Encounter (Signed)
LMTRC to schedule for follow up appt and to let us know if he is needing a 30 day supply until seen in office.

## 2019-02-01 NOTE — Telephone Encounter (Signed)
Pt advised and voiced understanding.    Apt made for 02/12/19 at 2:15pm.

## 2019-02-12 ENCOUNTER — Other Ambulatory Visit: Payer: Self-pay

## 2019-02-12 ENCOUNTER — Encounter: Payer: Self-pay | Admitting: Family Medicine

## 2019-02-12 ENCOUNTER — Ambulatory Visit (INDEPENDENT_AMBULATORY_CARE_PROVIDER_SITE_OTHER): Payer: Medicare HMO | Admitting: Family Medicine

## 2019-02-12 VITALS — BP 132/88 | HR 68 | Temp 98.4°F | Resp 16 | Ht 70.0 in | Wt 209.4 lb

## 2019-02-12 DIAGNOSIS — I2510 Atherosclerotic heart disease of native coronary artery without angina pectoris: Secondary | ICD-10-CM | POA: Diagnosis not present

## 2019-02-12 DIAGNOSIS — F3341 Major depressive disorder, recurrent, in partial remission: Secondary | ICD-10-CM | POA: Diagnosis not present

## 2019-02-12 DIAGNOSIS — E663 Overweight: Secondary | ICD-10-CM | POA: Diagnosis not present

## 2019-02-12 DIAGNOSIS — I10 Essential (primary) hypertension: Secondary | ICD-10-CM

## 2019-02-12 DIAGNOSIS — E7800 Pure hypercholesterolemia, unspecified: Secondary | ICD-10-CM | POA: Diagnosis not present

## 2019-02-12 DIAGNOSIS — K219 Gastro-esophageal reflux disease without esophagitis: Secondary | ICD-10-CM

## 2019-02-12 DIAGNOSIS — E119 Type 2 diabetes mellitus without complications: Secondary | ICD-10-CM

## 2019-02-12 DIAGNOSIS — F325 Major depressive disorder, single episode, in full remission: Secondary | ICD-10-CM | POA: Diagnosis not present

## 2019-02-12 LAB — POCT GLYCOSYLATED HEMOGLOBIN (HGB A1C)
HBA1C, POC (CONTROLLED DIABETIC RANGE): 6.7 % (ref 0.0–7.0)
HbA1c POC (<> result, manual entry): 6.7 % (ref 4.0–5.6)
HbA1c, POC (prediabetic range): 6.7 % — AB (ref 5.7–6.4)
Hemoglobin A1C: 6.7 % — AB (ref 4.0–5.6)

## 2019-02-12 MED ORDER — METFORMIN HCL 500 MG PO TABS: 500.0000 mg | ORAL_TABLET | Freq: Every day | ORAL | 1 refills | Status: DC

## 2019-02-12 MED ORDER — PANTOPRAZOLE SODIUM 40 MG PO TBEC: 40.0000 mg | DELAYED_RELEASE_TABLET | Freq: Two times a day (BID) | ORAL | 3 refills | Status: DC

## 2019-02-12 MED ORDER — BUPROPION HCL ER (XL) 300 MG PO TB24: 300.0000 mg | ORAL_TABLET | Freq: Every day | ORAL | 1 refills | Status: DC

## 2019-02-12 MED ORDER — MIRTAZAPINE 30 MG PO TBDP: 30.0000 mg | ORAL_TABLET | Freq: Every day | ORAL | 1 refills | Status: DC

## 2019-02-12 MED ORDER — ATORVASTATIN CALCIUM 80 MG PO TABS: 80.0000 mg | ORAL_TABLET | Freq: Every day | ORAL | 3 refills | Status: DC

## 2019-02-12 MED ORDER — TRAZODONE HCL 50 MG PO TABS: 150.0000 mg | ORAL_TABLET | Freq: Every day | ORAL | 1 refills | Status: DC

## 2019-02-12 MED ORDER — METOPROLOL TARTRATE 50 MG PO TABS: 50.0000 mg | ORAL_TABLET | Freq: Every day | ORAL | 1 refills | Status: DC

## 2019-02-12 MED ORDER — SYMBICORT 160-4.5 MCG/ACT IN AERO: 2.0000 | INHALATION_SPRAY | Freq: Two times a day (BID) | RESPIRATORY_TRACT | 3 refills | Status: DC

## 2019-02-12 NOTE — Patient Instructions (Addendum)
Metformin (diabetes) every day- once a day now.  All other meds same   Follow up in 5-6 months [facility]- fasting abs will be due at that visit.   Please help [place] help you:  We are honored you have chosen [facility] for your Primary Care home. Below you will find basic instructions that you may need to access in the future. Please help [place] help you by reading the instructions, which cover many of the frequent questions we experience.   Prescription refills and request:  -In order to allow more efficient response time, please call your pharmacy for all refills. They will forward the request electronically to [place]. This allows for the quickest possible response. Request left on a nurse line can take longer to refill, since these are checked as time allows between office patients and other phone calls.  - refill request can take up to 3-5 working days to complete.  - If request is sent electronically and request is appropiate, it is usually completed in 1-2 business days.  - all patients will need to be seen routinely for all chronic medical conditions requiring prescription medications (see follow-up below). If you are overdue for follow up on your condition, you will be asked to make an appointment and we will call in enough medication to cover you until your appointment (up to 30 days).  - all controlled substances will require a face to face visit to request/refill.  - if you desire your prescriptions to go through a new pharmacy, and have an active script at original pharmacy, you will need to call your pharmacy and have scripts transferred to new pharmacy. This is completed between the pharmacy locations and not by your provider.    Results: If any images or labs were ordered, it can take up to 1 week to get results depending on the test ordered and the lab/facility running and resulting the test. - Normal or stable results, which do not need further discussion, may be released to your mychart  immediately with attached note to you. A call may not be generated for normal results. Please make certain to sign up for mychart. If you have questions on how to activate your mychart you can call the front office.  - If your results need further discussion, our office will attempt to contact you via phone, and if unable to reach you after 2 attempts, we will release your abnormal result to your mychart with instructions.  - All results will be automatically released in mychart after 1 week.  - Your provider will provide you with explanation and instruction on all relevant material in your results. Please keep in mind, results and labs may appear confusing or abnormal to the untrained eye, but it does not mean they are actually abnormal for you personally. If you have any questions about your results that are not covered, or you desire more detailed explanation than what was provided, you should make an appointment with your provider to do so.   Our office handles many outgoing and incoming calls daily. If we have not contacted you within 1 week about your results, please check your mychart to see if there is a message first and if not, then contact our office.  In helping with this matter, you help decrease call volume, and therefore allow [place] to be able to respond to patients needs more efficiently.   Acute office visits (sick visit):  An acute visit is intended for a new problem and  are scheduled in shorter time slots to allow schedule openings for patients with new problems. This is the appropriate visit to discuss a new problem. Problems will not be addressed by phone call or Echart message. Appointment is needed if requesting treatment. In order to provide you with excellent quality medical care with proper time for you to explain your problem, have an exam and receive treatment with instructions, these appointments should be limited to one new problem per visit. If you experience a new problem, in  which you desire to be addressed, please make an acute office visit, we save openings on the schedule to accommodate you. Please do not save your new problem for any other type of visit, let us take care of it properly and quickly for you.   Follow up visits:  Depending on your condition(s) your provider will need to see you routinely in order to provide you with quality care and prescribe medication(s). Most chronic conditions (Example: hypertension, Diabetes, depression/anxiety... etc), require visits a couple times a year. Your provider will instruct you on proper follow up for your personal medical conditions and history. Please make certain to make follow up appointments for your condition as instructed. Failing to do so could result in lapse in your medication treatment/refills. If you request a refill, and are overdue to be seen on a condition, we will always provide you with a 30 day script (once) to allow you time to schedule.    Medicare wellness (well visit): - we have a wonderful Nurse Maudie Mercury), that will meet with you and provide you will yearly medicare wellness visits. These visits should occur yearly (can not be scheduled less than 1 calendar year apart) and cover preventive health, immunizations, advance directives and screenings you are entitled to yearly through your medicare benefits. Do not miss out on your entitled benefits, this is when medicare will pay for these benefits to be ordered for you.  These are strongly encouraged by your provider and is the appropriate type of visit to make certain you are up to date with all preventive health benefits. If you have not had your medicare wellness exam in the last 12 months, please make certain to schedule one by calling the office and schedule your medicare wellness with Maudie Mercury as soon as possible.   Yearly physical (well visit):  - Adults are recommended to be seen yearly for physicals. Check with your insurance and date of your last physical,  most insurances require one calendar year between physicals. Physicals include all preventive health topics, screenings, medical exam and labs that are appropriate for gender/age and history. You may have fasting labs needed at this visit. This is a well visit (not a sick visit), new problems should not be covered during this visit (see acute visit).  - Pediatric patients are seen more frequently when they are younger. Your provider will advise you on well child visit timing that is appropriate for your their age. - This is not a medicare wellness visit. Medicare wellness exams do not have an exam portion to the visit. Some medicare companies allow for a physical, some do not allow a yearly physical. If your medicare allows a yearly physical you can schedule the medicare wellness with our nurse Maudie Mercury and have your physical with your provider after, on the same day. Please check with insurance for your full benefits.   Late Policy/No Shows:  - all new patients should arrive 15-30 minutes earlier than appointment to allow [place] time  to  obtain all personal demographics,  insurance information and for you to complete office paperwork. - All established patients should arrive 10-15 minutes earlier than appointment time to update all information and be checked in .  - In our best efforts to run on time, if you are late for your appointment you will be asked to either reschedule or if able, we will work you back into the schedule. There will be a wait time to work you back in the schedule,  depending on availability.  - If you are unable to make it to your appointment as scheduled, please call 24 hours ahead of time to allow [place] to fill the time slot with someone else who needs to be seen. If you do not cancel your appointment ahead of time, you may be charged a no show fee.

## 2019-02-12 NOTE — Progress Notes (Signed)
[name] , 02/01/1946, 73 y.o., male MRN: [id number] Patient Care Team    Relationship Specialty Notifications Start End  [name], DO PCP - General Family Medicine  12/25/15   Josue Hector, MD PCP - Cardiology Cardiology Admissions 08/20/18   Brunetta Genera, MD Consulting Physician Hematology  09/03/16   Josue Hector, MD Consulting Physician Cardiology  09/03/16   Wilford Corner, MD Consulting Physician Gastroenterology  09/03/16   Melina Schools, OD  Optometry  09/03/16   Kathie Rhodes, MD Consulting Physician Urology  09/10/16     Chief Complaint  Patient presents with  . Diabetes    Not fasting. Pt checks sugars at home and they have been running "average" per patient. Pt has not had eye exam but will schedule   . Hypertension    Subjective:  Recurrent major depressive disorder, in partial remission [facility]) Patient feels that his depression is not as well controlled. Current medication regimen of trazodone, Remeron and Wellbutrin. He denies negative side effects.  He feels he just has some increased anxiety secondary to its going on on and the road currently.  He feels his medications are fine and this will pass.  Gastroesophageal reflux disease, esophagitis presence not specified  EGD 03/2016 by Dr. Michail Sermon with signs of barrett's esophagus. Patient was tried on lower dose medication, and was unable to tolerate secondary to return of symptoms.  Refills are needed today.  COPD ([place]) He uses Symbicort daily, Spiriva needed during certain times of the year only. His pneumonia series is completed.  COPD is well controlled, no complaints.  Atherosclerosis of native coronary artery of native heart without angina pectoris/Essential hypertension/HYPERCHOLESTEROLEMIA/History of MI (myocardial infarction)/IDA Pt reports compliance with metoprolol 50 mg daily. Blood pressures ranges at home are not routinely checked.Patient denies chest pain, shortness of breath,  dizziness or lower extremity edema.   Pt takes a daily baby ASA and Plavix. Pt is  prescribed statin and followed by Dr. Johnsie Cancel Cardiology. BMP: 08/07/2018 within normal limits CBC: 08/07/2018 hemoglobin 12.8, hematocrit 37.9, MCV 83.3, iron 64, TIBC 273, percent saturation 23, ferritin 162--> he is taking oral iron supplement Lipids: 08/07/2018, cholesterol 124, HDL 41.9, LDL 50 triglycerides 162 Diet: Low-sodium RF: Hypertension, hyperlipidemia, diabetes  Type 2 diabetes mellitus without complication, without long-term current use of insulin ([place]) patient reports compliance with metformin 500 mg QOD. Marland Kitchenrkdiad  Pt reports BG ranges not routinely checked. - Foot exam: 01/07/2018 - eye exam: 06/18/2017--> he is looking for new ophth--> Referral placed today. - PNA: Series completed - Flu: UTD  07/2018 completed, encouraged yearly - Urine Microalbumin w/creat. Ratio: completed 02/12/2019   Depression screen [facility] 2/9 02/12/2019 08/07/2018 06/22/2018 01/07/2018 12/01/2017  Decreased Interest 2 0 1 2 0  Down, Depressed, Hopeless 2 0 1 2 0  PHQ - 2 Score 4 0 2 4 0  Altered sleeping 1 1 0 3 -  Tired, decreased energy 2 0 0 2 -  Change in appetite 3 0 0 3 -  Feeling bad or failure about yourself  1 0 1 1 -  Trouble concentrating 1 1 0 2 -  Moving slowly or fidgety/restless 0 0 0 1 -  Suicidal thoughts 0 0 0 0 -  PHQ-9 Score 12 2 3 16  -  Difficult doing work/chores Somewhat difficult Not difficult at all Somewhat difficult - -   GAD 7 : Generalized Anxiety Score 02/12/2019 12/25/2015  Nervous, Anxious, on Edge 0 2  Control/stop  worrying 3 2  Worry too much - different things 3 3  Trouble relaxing 3 2  Restless 3 2  Easily annoyed or irritable 3 2  Afraid - awful might happen 0 0  Total GAD 7 Score 15 13  Anxiety Difficulty Somewhat difficult Somewhat difficult    Allergies  Allergen Reactions  . Altace [Ramipril] Cough  . Codeine     Makes sick  . Tape     Blisters   . Zantac [Ranitidine  Hcl] Dermatitis   Social History   Tobacco Use  . Smoking status: Former Smoker    Types: Cigarettes    Last attempt to quit: 11/25/1998    Years since quitting: 20.2  . Smokeless tobacco: Never Used  Substance Use Topics  . Alcohol use: No   Past Medical History:  Diagnosis Date  . Adenomatous colon polyp 2011  . Barrett esophagus 12/2013   EGD  . CAD (coronary artery disease)    ETT/Lexiscan-Myoview (11/14):  Normal; no ischemia, EF 67%  . CKD (chronic kidney disease), stage III ([place])   . Colon polyp 09/2010   adenoma  . COPD (chronic obstructive pulmonary disease) ([place]) 2009   spirometry with "moderate COPD"  . Depression   . Diabetes mellitus without complication ([place])   . Dyspnea   . GERD (gastroesophageal reflux disease)   . GI bleed    after polypectomy/admitted  . HTN (hypertension)   . Hypercholesterolemia   . Myocardial infarction ([place])   . Obesity   . Sleep apnea   . Spontaneous pneumothorax   . Vertigo    Past Surgical History:  Procedure Laterality Date  . APPENDECTOMY    . PLEURAL SCARIFICATION  1987  . TONSILLECTOMY    . VARICOCELECTOMY     Family History  Problem Relation Age of Onset  . Heart disease Mother   . CVA Mother   . Coronary artery disease Father 51  . Heart disease Father   . Stroke Father   . Cancer - Other Brother   . Hypertension Unknown   . Hyperlipidemia Unknown   . Colon polyps Sister   . Cancer Sister    Allergies as of 02/12/2019      Reactions   Altace [ramipril] Cough   Codeine    Makes sick   Tape    Blisters   Zantac [ranitidine Hcl] Dermatitis      Medication List       Accurate as of February 12, 2019 11:59 PM. Always use your most recent med list.        aspirin EC 81 MG tablet Take 81 mg by mouth daily.   atorvastatin 80 MG tablet Commonly known as:  LIPITOR Take 1 tablet (80 mg total) by mouth daily.   buPROPion 300 MG 24 hr tablet Commonly known as:  WELLBUTRIN XL Take 1 tablet (300 mg total)  by mouth daily.   clopidogrel 75 MG tablet Commonly known as:  PLAVIX Take 1 tablet (75 mg total) by mouth daily.   diclofenac sodium 1 % Gel Commonly known as:  VOLTAREN Apply 4 g topically 4 (four) times daily. To affected joint.   iron polysaccharides 150 MG capsule Commonly known as:  NIFEREX Take 1 capsule (150 mg total) by mouth daily.   metFORMIN 500 MG tablet Commonly known as:  GLUCOPHAGE Take 1 tablet (500 mg total) by mouth daily with breakfast. diabetes   metoprolol tartrate 50 MG tablet Commonly known as:  LOPRESSOR Take 1 tablet (  50 mg total) by mouth daily.   mirtazapine 30 MG disintegrating tablet Commonly known as:  REMERON SOL-TAB Take 1 tablet (30 mg total) by mouth at bedtime. On tongue   multivitamin tablet Take 1 tablet by mouth daily.   nitroGLYCERIN 0.4 MG SL tablet Commonly known as:  NITROSTAT Place 1 tablet (0.4 mg total) under the tongue every 5 (five) minutes as needed for chest pain (3 doses max).   nitroGLYCERIN 0.2 mg/hr patch Commonly known as:  NITRODUR - Dosed in mg/24 hr 1/4 patch to heel daily for tendonitis   pantoprazole 40 MG tablet Commonly known as:  PROTONIX Take 1 tablet (40 mg total) by mouth 2 (two) times daily.   Symbicort 160-4.5 MCG/ACT inhaler Generic drug:  budesonide-formoterol Inhale 2 puffs into the lungs 2 (two) times daily.   tamsulosin 0.4 MG Caps capsule Commonly known as:  FLOMAX TAKE 1 CAPSULE BY MOUTH ONCE AT NIGHT AT BEDTIME   traZODone 50 MG tablet Commonly known as:  DESYREL Take 3 tablets (150 mg total) by mouth at bedtime.   VITAMIN B 12 PO Take by mouth.       No results found for this or any previous visit (from the past 24 hour(s)). No results found.   ROS: Negative, with the exception of above mentioned in HPI   Objective:  BP 132/88 (BP Location: Right Arm, Patient Position: Sitting, Cuff Size: Normal)   Pulse 68   Temp 98.4 F (36.9 C) (Oral)   Resp 16   Ht 5\' 10"  (1.778 m)    Wt 209 lb 6 oz (95 kg)   SpO2 96%   BMI 30.04 kg/m  Body mass index is 30.04 kg/m. Gen: Afebrile. No acute distress.  Nontoxic in presentation, pleasant, Caucasian obese male. HENT: AT. [place].  MMM.  Eyes:Pupils Equal Round Reactive to light, Extraocular movements intact,  Conjunctiva without redness, discharge or icterus. Neck/lymp/endocrine: Supple, no lymphadenopathy CV: RRR no murmur, no edema, +2/4 P posterior tibialis pulses Chest: CTAB, no wheeze or crackles Abd: Soft. NTND. BS present.  No masses palpated.  Skin: No rashes, multiple bruises present over forearms. Neuro:  Normal gait. PERLA. EOMi. Alert. Oriented x3  Psych: Normal affect, dress and demeanor. Normal speech. Normal thought content and judgment.   No results found for this or any previous visit (from the past 48 hour(s)).  Assessment/Plan: SIRUS LABRIE is a 73 y.o. male present for OV for  Recurrent major depressive disorder, in partial remission ([place]) -Overall stable.  He has some mild anxiety increased secondary to what is going on in the world with coronavirus.  -  Continue current regimen, refills provided today - Continue  Remeron to 30 mg QHS.  - continue trazodone 2-3 tabs QHS PRN - Continue wellbutrin 300 QD.  - Follow-up 4-5 months  Gastroesophageal reflux disease, esophagitis presence not specified -Stable.  Continue  Protonix 40 mg twice a day, was unable to take back secondary to recurrence of symptoms. Refills provided today - B 12, vitamin D and magnesium levels collected at least every other year.  Due 07/2020 - pantoprazole (PROTONIX) 40 MG tablet; Take 1 tablet (40 mg total) by mouth 2 (two) times daily.  Dispense: 180 tablet; Refill: 1  Chronic bronchitis, unspecified chronic bronchitis type ([place]) -Stable.  Continue Symbicort. Can hold on Spiriva if feeling okay on current regimen. May need to add the Spiriva during colder months if that is a trigger for him  Atherosclerosis of native  coronary artery  of native heart without angina pectoris/Essential hypertension/HYPERCHOLESTEROLEMIA/History of MI (myocardial infarction) Stage 3 chronic kidney disease/ iron deficiency anemia - following with cards, Dr. Johnsie Cancel. - BP stable- but borderline today- he will  Monitor. May be 2/2 to anxiety - continue current regimen.  - Continue/refilled statin, metoprolol - ASA and plavix managed by cardio - RF control of DM and HTN are both stable.  - low sodium diet and exercise encouraged.   Type 2 diabetes mellitus without complication, without long-term current use of insulin (HCC) - A1c today 5.8--> 6.0-->6.1>>6.7today.  - increase metformin from every other day to daily.  - Foot exam: 02/12/2019 - eye exam: 06/18/2017--> referred 02/12/2019 - PNA: series completed 2018 - Flu: 07/2018 UTD completed, encouraged yearly - Urine Microalbumin w/creat. Ratio: completed today - F/U 4- 22months.   Reviewed expectations re: course of current medical issues.  Discussed self-management of symptoms.  Outlined signs and symptoms indicating need for more acute intervention.  Patient verbalized understanding and all questions were answered.  Patient received an After-Visit Summary.   electronically signed by:  Howard Pouch, DO  [facility]

## 2019-02-13 LAB — MICROALBUMIN / CREATININE URINE RATIO
Creatinine, Urine: 46 mg/dL (ref 20–320)
Microalb Creat Ratio: 4 mcg/mg creat (ref <30)
Microalb, Ur: 0.2 mg/dL

## 2019-02-16 ENCOUNTER — Encounter: Payer: Self-pay | Admitting: Family Medicine

## 2019-03-30 DIAGNOSIS — Z7984 Long term (current) use of oral hypoglycemic drugs: Secondary | ICD-10-CM | POA: Diagnosis not present

## 2019-03-30 DIAGNOSIS — E119 Type 2 diabetes mellitus without complications: Secondary | ICD-10-CM | POA: Diagnosis not present

## 2019-03-30 DIAGNOSIS — H2513 Age-related nuclear cataract, bilateral: Secondary | ICD-10-CM | POA: Diagnosis not present

## 2019-03-30 LAB — HM DIABETES EYE EXAM

## 2019-04-14 ENCOUNTER — Telehealth: Payer: Self-pay | Admitting: Cardiovascular Disease

## 2019-04-14 NOTE — Telephone Encounter (Signed)
New message     Called to convert 04-27-19 ov to virtual video visit.  Patient gave consent for video visit.   YOUR CARDIOLOGY TEAM HAS ARRANGED FOR AN E-VISIT FOR YOUR APPOINTMENT - PLEASE REVIEW IMPORTANT INFORMATION BELOW SEVERAL DAYS PRIOR TO YOUR APPOINTMENT  Due to the recent COVID-19 pandemic, we are transitioning in-person office visits to tele-medicine visits in an effort to decrease unnecessary exposure to our patients, their families, and staff. These visits are billed to your insurance just like a normal visit is. We also encourage you to sign up for MyChart if you have not already done so. You will need a smartphone if possible. For patients that do not have this, we can still complete the visit using a regular telephone but do prefer a smartphone to enable video when possible. You may have a family member that lives with you that can help. If possible, we also ask that you have a blood pressure cuff and scale at home to measure your blood pressure, heart rate and weight prior to your scheduled appointment. Patients with clinical needs that need an in-person evaluation and testing will still be able to come to the office if absolutely necessary. If you have any questions, feel free to call our office.     YOUR PROVIDER WILL BE USING THE FOLLOWING PLATFORM TO COMPLETE YOUR VISIT: Doxy Me.   IF USING MYCHART - How to Download the MyChart App to Your SmartPhone   - If Apple, go to [employer] and type in MyChart in the search bar and download the app. If Android, ask patient to go to [employer] and type in [place] in the search bar and download the app. The app is free but as with any other app downloads, your phone may require you to verify saved payment information or Apple/Android password.  - You will need to then log into the app with your MyChart username and password, and select [facility] as your healthcare provider to link the account.  - When it is time for your visit,  go to the MyChart app, find appointments, and click Begin Video Visit. Be sure to Select Allow for your device to access the Microphone and Camera for your visit. You will then be connected, and your provider will be with you shortly.  **If you have any issues connecting or need assistance, please contact MyChart service desk [phone])**  **If using a computer, in order to ensure the best quality for your visit, you will need to use either of the following Internet Browsers: Insurance underwriter or Microsoft Edge**   IF USING DOXIMITY or [email or web address] - The staff will give you instructions on receiving your link to join the meeting the day of your visit.      2-3 DAYS BEFORE YOUR APPOINTMENT  You will receive a telephone call from one of our [facility] team members - your caller ID may say "Unknown caller." If this is a video visit, we will walk you through how to get the video launched on your phone. We will remind you check your blood pressure, heart rate and weight prior to your scheduled appointment. If you have an Apple Watch or Kardia, please upload any pertinent ECG strips the day before or morning of your appointment to [place]. Our staff will also make sure you have reviewed the consent and agree to move forward with your scheduled tele-health visit.     THE DAY OF YOUR APPOINTMENT  Approximately 15 minutes  prior to your scheduled appointment, you will receive a telephone call from one of [facility] team - your caller ID may say "Unknown caller."  Our staff will confirm medications, vital signs for the day and any symptoms you may be experiencing. Please have this information available prior to the time of visit start. It may also be helpful for you to have a pad of paper and pen handy for any instructions given during your visit. They will also walk you through joining the smartphone meeting if this is a video visit.    CONSENT FOR TELE-HEALTH VISIT - PLEASE REVIEW  I hereby  voluntarily request, consent and authorize [facility] and its employed or contracted physicians, physician assistants, nurse practitioners or other licensed health care professionals (the Practitioner), to provide me with telemedicine health care services (the Services") as deemed necessary by the treating Practitioner. I acknowledge and consent to receive the Services by the Practitioner via telemedicine. I understand that the telemedicine visit will involve communicating with the Practitioner through live audiovisual communication technology and the disclosure of certain medical information by electronic transmission. I acknowledge that I have been given the opportunity to request an in-person assessment or other available alternative prior to the telemedicine visit and am voluntarily participating in the telemedicine visit.  I understand that I have the right to withhold or withdraw my consent to the use of telemedicine in the course of my care at any time, without affecting my right to future care or treatment, and that the Practitioner or I may terminate the telemedicine visit at any time. I understand that I have the right to inspect all information obtained and/or recorded in the course of the telemedicine visit and may receive copies of available information for a reasonable fee.  I understand that some of the potential risks of receiving the Services via telemedicine include:   Delay or interruption in medical evaluation due to technological equipment failure or disruption;  Information transmitted may not be sufficient (e.g. poor resolution of images) to allow for appropriate medical decision making by the Practitioner; and/or   In rare instances, security protocols could fail, causing a breach of personal health information.  Furthermore, I acknowledge that it is my responsibility to provide information about my medical history, conditions and care that is complete and accurate to the best  of my ability. I acknowledge that Practitioner's advice, recommendations, and/or decision may be based on factors not within their control, such as incomplete or inaccurate data provided by me or distortions of diagnostic images or specimens that may result from electronic transmissions. I understand that the practice of medicine is not an exact science and that Practitioner makes no warranties or guarantees regarding treatment outcomes. I acknowledge that I will receive a copy of this consent concurrently upon execution via email to the email address I last provided but may also request a printed copy by calling the office of [facility].    I understand that my insurance will be billed for this visit.   I have read or had this consent read to me.  I understand the contents of this consent, which adequately explains the benefits and risks of the Services being provided via telemedicine.   I have been provided ample opportunity to ask questions regarding this consent and the Services and have had my questions answered to my satisfaction.  I give my informed consent for the services to be provided through the use of telemedicine in my medical care  By  participating in this telemedicine visit I agree to the above.

## 2019-04-17 ENCOUNTER — Other Ambulatory Visit: Payer: Self-pay | Admitting: Nurse Practitioner

## 2019-04-22 NOTE — Progress Notes (Deleted)
Virtual Visit via Video Note   This visit type was conducted due to national recommendations for restrictions regarding the COVID-19 Pandemic (e.g. social distancing) in an effort to limit this patient's exposure and mitigate transmission in our community.  Due to his co-morbid illnesses, this patient is at least at moderate risk for complications without adequate follow up.  This format is felt to be most appropriate for this patient at this time.  All issues noted in this document were discussed and addressed.  A limited physical exam was performed with this format.  Please refer to the patient's chart for his consent to telehealth for [facility].   Date:  04/22/2019   ID:  [name], DOB 1946/06/25, MRN [id number]  Patient Location: Home Provider Location: Office  PCP:  [name], DO  Cardiologist:   Johnsie Cancel Electrophysiologist:  None   Evaluation Performed:  Follow-Up Visit  Chief Complaint:  CAD  History of Present Illness:    73 y.o. with CAD and stent to LAD in 2007. Remote smoker with COPD, DM, HTN and major depression. Activity limited by COPD Weight is up. No angina compliant with meds. Nitro refilled June 2019   ***  The patient  does not have symptoms concerning for COVID-19 infection (fever, chills, cough, or new shortness of breath).    Past Medical History:  Diagnosis Date  . Adenomatous colon polyp 2011  . Barrett esophagus 12/2013   EGD  . CAD (coronary artery disease)    ETT/Lexiscan-Myoview (11/14):  Normal; no ischemia, EF 67%  . CKD (chronic kidney disease), stage III ([place])   . Colon polyp 09/2010   adenoma  . COPD (chronic obstructive pulmonary disease) ([place]) 2009   spirometry with "moderate COPD"  . Depression   . Diabetes mellitus without complication ([place])   . Dyspnea   . GERD (gastroesophageal reflux disease)   . GI bleed    after polypectomy/admitted  . HTN (hypertension)   . Hypercholesterolemia   . Myocardial infarction ([place])    . Obesity   . Sleep apnea   . Spontaneous pneumothorax   . Vertigo    Past Surgical History:  Procedure Laterality Date  . APPENDECTOMY    . PLEURAL SCARIFICATION  1987  . TONSILLECTOMY    . VARICOCELECTOMY       No outpatient medications have been marked as taking for the 04/27/19 encounter (Appointment) with Josue Hector, MD.     Allergies:   Altace [ramipril]; Codeine; Tape; and Zantac [ranitidine hcl]   Social History   Tobacco Use  . Smoking status: Former Smoker    Types: Cigarettes    Last attempt to quit: 11/25/1998    Years since quitting: 20.4  . Smokeless tobacco: Never Used  Substance Use Topics  . Alcohol use: No  . Drug use: No     Family Hx: The patient's family history includes CVA in his mother; Cancer in his sister; Cancer - Other in his brother; Colon polyps in his sister; Coronary artery disease (age of onset: 22) in his father; Heart disease in his father and mother; Hyperlipidemia in his unknown relative; Hypertension in his unknown relative; Stroke in his father.  ROS:   Please see the history of present illness.     All other systems reviewed and are negative.   Prior CV studies:   The following studies were reviewed today:  Myoview Impression 2014 Exercise Capacity: [place] with no exercise. BP Response: Normal blood pressure response. Clinical  Symptoms: No significant symptoms noted. ECG Impression: No significant ST segment change suggestive of ischemia. Comparison with Prior Nuclear Study: No significant change from previous study 01/14/11  Overall Impression: Normal stress nuclear study.  LV Ejection Fraction: 67%. LV Wall Motion: NL LV Function; NL Wall Motion.   [name], Todd Bonito., MD, [facility] 10/13/2013, 5:17 PM Office - [phone] Pager [phone]  Labs/Other Tests and Data Reviewed:    EKG:   SR rate 62 normal   Recent Labs: 08/07/2018: ALT 28; BUN 18; Creatinine, Ser 1.25; Hemoglobin 12.8; Platelets 201.0;  Potassium 4.4; Sodium 142; TSH 1.38   Recent Lipid Panel Lab Results  Component Value Date/Time   CHOL 124 08/07/2018 11:08 AM   TRIG 162.0 (H) 08/07/2018 11:08 AM   HDL 41.90 08/07/2018 11:08 AM   CHOLHDL 3 08/07/2018 11:08 AM   LDLCALC 50 08/07/2018 11:08 AM    Wt Readings from Last 3 Encounters:  02/12/19 95 kg  08/31/18 91.2 kg  08/07/18 90.8 kg     Objective:    Vital Signs:  There were no vitals taken for this visit.   Skin warm and dry No distress No tachypnea No JVP elevation  Neuro appears non focal No edema  Telephone visit no exam   ASSESSMENT & PLAN:    1. CAD with remote LAD stent - normal Myoview from 2014 - he is doing well clinically - no symptoms. Continue with CV risk factor modification. Plavix and NTG refilled today for him. His labs are followed by PCP.   2. HLD - on statin - last lipids noted.   3. HTN - BP fine on current regimen. No changes made today.   4. DM - followed by PCP - last A1C was excellent.   5. COPD - stable shortness of breath.   6. Depression:  Currently on trazodone, and welbutrin F/u with Dr Raoul Pitch  COVID-19 Education: The signs and symptoms of COVID-19 were discussed with the patient and how to seek care for testing (follow up with PCP or arrange E-visit).  The importance of social distancing was discussed today.  Time:   Today, I have spent 30 minutes with the patient with telehealth technology discussing the above problems.     Medication Adjustments/Labs and Tests Ordered: Current medicines are reviewed at length with the patient today.  Concerns regarding medicines are outlined above.   Tests Ordered:  ***  Medication Changes:  None   Disposition:  Follow up in a year  Signed, Jenkins Rouge, MD  04/22/2019 1:29 PM    [place]

## 2019-04-27 ENCOUNTER — Telehealth: Payer: Medicare HMO | Admitting: Cardiovascular Disease

## 2019-04-29 DIAGNOSIS — Z85828 Personal history of other malignant neoplasm of skin: Secondary | ICD-10-CM | POA: Diagnosis not present

## 2019-04-29 DIAGNOSIS — L570 Actinic keratosis: Secondary | ICD-10-CM | POA: Diagnosis not present

## 2019-04-29 DIAGNOSIS — Z08 Encounter for follow-up examination after completed treatment for malignant neoplasm: Secondary | ICD-10-CM | POA: Diagnosis not present

## 2019-04-29 DIAGNOSIS — L820 Inflamed seborrheic keratosis: Secondary | ICD-10-CM | POA: Diagnosis not present

## 2019-05-18 DIAGNOSIS — C4441 Basal cell carcinoma of skin of scalp and neck: Secondary | ICD-10-CM | POA: Diagnosis not present

## 2019-06-06 NOTE — Progress Notes (Signed)
Virtual Visit via Video Note   This visit type was conducted due to national recommendations for restrictions regarding the COVID-19 Pandemic (e.g. social distancing) in an effort to limit this patient's exposure and mitigate transmission in our community.  Due to his co-morbid illnesses, this patient is at least at moderate risk for complications without adequate follow up.  This format is felt to be most appropriate for this patient at this time.  All issues noted in this document were discussed and addressed.  A limited physical exam was performed with this format.  Please refer to the patient's chart for his consent to telehealth for [facility].   Date:  06/10/2019   ID:  [name], DOB [birth year], MRN [id number]  Patient Location: Home Provider Location: Office  PCP:  [name], DO  Cardiologist:   Johnsie Cancel Electrophysiologist:  None   Evaluation Performed:  Follow-Up Visit  Chief Complaint:  CAD  History of Present Illness:    [name] is a 73 y.o. . male who presents today for f/u  He has known CAD with prior LAD stent from 2007. He is on chronic DAPT with Plavix/aspirin.  Remote smoker. Other issues include CKD, Barrett's esophagus, COPD, DM, depression, HLD, HTN, prior GI bleed,   He continues to gain weight - says he is trying to eat less. Remains fairly active but remains limited from his COPD. No chest pain. Breathing seems stable.  Labs are typically checked by his PCP. No angina  Significant depression on Welbutrin, Remeron and Trazodone   Needs new nitro   The patient  does not have symptoms concerning for COVID-19 infection (fever, chills, cough, or new shortness of breath).    Past Medical History:  Diagnosis Date  . Adenomatous colon polyp 2011  . Barrett esophagus 12/2013   EGD  . CAD (coronary artery disease)    ETT/Lexiscan-Myoview (11/14):  Normal; no ischemia, EF 67%  . CKD (chronic kidney disease), stage III ([place])   . Colon polyp  09/2010   adenoma  . COPD (chronic obstructive pulmonary disease) ([place]) 2009   spirometry with "moderate COPD"  . Depression   . Diabetes mellitus without complication ([place])   . Dyspnea   . GERD (gastroesophageal reflux disease)   . GI bleed    after polypectomy/admitted  . HTN (hypertension)   . Hypercholesterolemia   . Myocardial infarction ([place])   . Obesity   . Sleep apnea   . Spontaneous pneumothorax   . Vertigo    Past Surgical History:  Procedure Laterality Date  . APPENDECTOMY    . PLEURAL SCARIFICATION  1987  . TONSILLECTOMY    . VARICOCELECTOMY       Current Meds  Medication Sig  . aspirin EC 81 MG tablet Take 81 mg by mouth daily.   Marland Kitchen atorvastatin (LIPITOR) 80 MG tablet Take 1 tablet (80 mg total) by mouth daily.  Marland Kitchen buPROPion (WELLBUTRIN XL) 300 MG 24 hr tablet Take 1 tablet (300 mg total) by mouth daily.  . clopidogrel (PLAVIX) 75 MG tablet TAKE 1 TABLET EVERY DAY  . Cyanocobalamin (VITAMIN B 12 PO) Take by mouth.  . diclofenac sodium (VOLTAREN) 1 % GEL Apply 4 g topically 4 (four) times daily. To affected joint.  . iron polysaccharides (NIFEREX) 150 MG capsule Take 1 capsule (150 mg total) by mouth daily.  . metFORMIN (GLUCOPHAGE) 500 MG tablet Take 1 tablet (500 mg total) by mouth daily with breakfast. diabetes  . metoprolol tartrate (  LOPRESSOR) 50 MG tablet Take 1 tablet (50 mg total) by mouth daily.  . mirtazapine (REMERON SOL-TAB) 30 MG disintegrating tablet Take 1 tablet (30 mg total) by mouth at bedtime. On tongue  . Multiple Vitamin (MULTIVITAMIN) tablet Take 1 tablet by mouth daily.    . nitroGLYCERIN (NITRODUR - DOSED IN MG/24 HR) 0.2 mg/hr patch 1/4 patch to heel daily for tendonitis  . nitroGLYCERIN (NITROSTAT) 0.4 MG SL tablet Place 1 tablet (0.4 mg total) under the tongue every 5 (five) minutes as needed for chest pain (3 doses max).  . pantoprazole (PROTONIX) 40 MG tablet Take 1 tablet (40 mg total) by mouth 2 (two) times daily.  . SYMBICORT 160-4.5  MCG/ACT inhaler Inhale 2 puffs into the lungs 2 (two) times daily.  . tamsulosin (FLOMAX) 0.4 MG CAPS capsule TAKE 1 CAPSULE BY MOUTH ONCE AT NIGHT AT BEDTIME  . traZODone (DESYREL) 50 MG tablet Take 3 tablets (150 mg total) by mouth at bedtime.     Allergies:   Altace [ramipril], Codeine, Tape, and Zantac [ranitidine hcl]   Social History   Tobacco Use  . Smoking status: Former Smoker    Types: Cigarettes    Quit date: 11/25/1998    Years since quitting: 20.5  . Smokeless tobacco: Never Used  Substance Use Topics  . Alcohol use: No  . Drug use: No     Family Hx: The patient's family history includes CVA in his mother; Cancer in his sister; Cancer - Other in his brother; Colon polyps in his sister; Coronary artery disease (age of onset: 79) in his father; Heart disease in his father and mother; Hyperlipidemia in his unknown relative; Hypertension in his unknown relative; Stroke in his father.  ROS:   Please see the history of present illness.     All other systems reviewed and are negative.   Prior CV studies:   The following studies were reviewed today:  Myovue 2014   Labs/Other Tests and Data Reviewed:    EKG:  SR rate 58 normal   Recent Labs: 08/07/2018: ALT 28; BUN 18; Creatinine, Ser 1.25; Hemoglobin 12.8; Platelets 201.0; Potassium 4.4; Sodium 142; TSH 1.38   Recent Lipid Panel Lab Results  Component Value Date/Time   CHOL 124 08/07/2018 11:08 AM   TRIG 162.0 (H) 08/07/2018 11:08 AM   HDL 41.90 08/07/2018 11:08 AM   CHOLHDL 3 08/07/2018 11:08 AM   LDLCALC 50 08/07/2018 11:08 AM    Wt Readings from Last 3 Encounters:  06/10/19 204 lb (92.5 kg)  02/12/19 209 lb 6 oz (95 kg)  08/31/18 201 lb (91.2 kg)     Objective:    Vital Signs:  BP (!) 142/88   Pulse (!) 58   Ht 5\' 10"  (1.778 m)   Wt 204 lb (92.5 kg)   SpO2 99%   BMI 29.27 kg/m    Affect appropriate Healthy:  appears stated age 5: normal Neck supple with no adenopathy JVP normal no bruits  no thyromegaly Lungs clear with no wheezing and good diaphragmatic motion Heart:  S1/S2 no murmur, no rub, gallop or click PMI normal Abdomen: benighn, BS positve, no tenderness, no AAA no bruit.  No HSM or HJR Distal pulses intact with no bruits No edema Neuro non-focal Skin warm and dry No muscular weakness   ASSESSMENT & PLAN:    1. CAD with remote LAD stent - normal Myoview from 2014 - he is doing well clinically - no symptoms. Continue with CV risk factor modification.  Plavix and NTG refilled today for him. His labs are followed by PCP.   2. HLD - on statin - last lipids noted.   3. HTN - BP fine on current regimen. No changes made today.   4. DM - followed by PCP - last A1C was excellent.   5. COPD - stable shortness of breath. Continue Symbicort  6. Depression: significant on multiple meds f/u primary   COVID-19 Education: The signs and symptoms of COVID-19 were discussed with the patient and how to seek care for testing (follow up with PCP or arrange E-visit).  The importance of social distancing was discussed today.  Time:   Today, I have spent 30 minutes with the patient with telehealth technology discussing the above problems.     Medication Adjustments/Labs and Tests Ordered: Current medicines are reviewed at length with the patient today.  Concerns regarding medicines are outlined above.   Tests Ordered:  None   Medication Changes:  None   Disposition:  Follow up in a year  Signed, Jenkins Rouge, MD  06/10/2019 9:27 AM    [place]

## 2019-06-07 ENCOUNTER — Telehealth: Payer: Self-pay

## 2019-06-07 NOTE — Telephone Encounter (Signed)
Spoke with pt who states that he would like to change his virtual appointment with Dr. Johnsie Cancel to office visit.      COVID-19 Pre-Screening Questions:  . In the past 7 to 10 days have you had a cough,  shortness of breath, headache, congestion, fever (100 or greater) body aches, chills, sore throat, or sudden loss of taste or sense of smell? No . Have you been around anyone with known Covid 19. No . Have you been around anyone who is awaiting Covid 19 test results in the past 7 to 10 days? No . Have you been around anyone who has been exposed to Covid 19, or has mentioned symptoms of Covid 19 within the past 7 to 10 days? No  If you have any concerns/questions about symptoms patients report during screening (either on the phone or at threshold). Contact the provider seeing the patient or DOD for further guidance.  If neither are available contact a member of the leadership team.

## 2019-06-10 ENCOUNTER — Encounter: Payer: Self-pay | Admitting: Cardiovascular Disease

## 2019-06-10 ENCOUNTER — Other Ambulatory Visit: Payer: Self-pay

## 2019-06-10 ENCOUNTER — Ambulatory Visit (INDEPENDENT_AMBULATORY_CARE_PROVIDER_SITE_OTHER): Payer: Medicare HMO | Admitting: Cardiovascular Disease

## 2019-06-10 ENCOUNTER — Encounter (INDEPENDENT_AMBULATORY_CARE_PROVIDER_SITE_OTHER): Payer: Self-pay

## 2019-06-10 VITALS — BP 142/88 | HR 58 | Ht 70.0 in | Wt 204.0 lb

## 2019-06-10 DIAGNOSIS — F329 Major depressive disorder, single episode, unspecified: Secondary | ICD-10-CM | POA: Diagnosis not present

## 2019-06-10 DIAGNOSIS — E1122 Type 2 diabetes mellitus with diabetic chronic kidney disease: Secondary | ICD-10-CM | POA: Diagnosis not present

## 2019-06-10 DIAGNOSIS — I2510 Atherosclerotic heart disease of native coronary artery without angina pectoris: Secondary | ICD-10-CM

## 2019-06-10 DIAGNOSIS — J449 Chronic obstructive pulmonary disease, unspecified: Secondary | ICD-10-CM | POA: Diagnosis not present

## 2019-06-10 DIAGNOSIS — I10 Essential (primary) hypertension: Secondary | ICD-10-CM | POA: Diagnosis not present

## 2019-06-10 DIAGNOSIS — N183 Chronic kidney disease, stage 3 (moderate): Secondary | ICD-10-CM | POA: Diagnosis not present

## 2019-06-10 MED ORDER — NITROGLYCERIN 0.4 MG SL SUBL: 0.4000 mg | SUBLINGUAL_TABLET | SUBLINGUAL | 3 refills | Status: AC | PRN

## 2019-06-10 MED ORDER — CLOPIDOGREL BISULFATE 75 MG PO TABS: 75.0000 mg | ORAL_TABLET | Freq: Every day | ORAL | 3 refills | Status: DC

## 2019-06-10 NOTE — Patient Instructions (Signed)
Your physician recommends that you continue on your current medications as directed. Please refer to the Current Medication list given to you today.  Your physician wants you to follow-up in: YEAR WITH DR NISHAN You will receive a reminder letter in the mail two months in advance. If you don't receive a letter, please call our office to schedule the follow-up appointment.  

## 2019-06-28 NOTE — Progress Notes (Addendum)
Subjective:   [name] is a 73 y.o. male who presents for Medicare Annual/Subsequent preventive examination.  Review of Systems:  No ROS.  Medicare Wellness Virtual Visit.  Visual/audio telehealth visit, UTA vital signs.   See social history for additional risk factors.   Cardiac Risk Factors include: advanced age (>49men, >62 women);dyslipidemia;diabetes mellitus;hypertension;male gender;family history of premature cardiovascular disease;obesity (BMI >30kg/m2)   Sleep patterns: Sleeps well, takes Trazodone nightly.  Home Safety/Smoke Alarms: Feels safe in home. Smoke alarms in place.  Living environment; residence and Firearm Safety: Lives with wife Gilmore Laroche) in 1 story home with basement. Rail at steps.  Seat Belt Safety/Bike Helmet: Wears seat belt.    Male:   CCS-Colonoscopy 04/02/2016, normal.Recall 10 years.     PSA-  Lab Results  Component Value Date   PSA 10.18 (H) 08/07/2018       Objective:    Vitals: There were no vitals taken for this visit.  There is no height or weight on file to calculate BMI.  Advanced Directives 06/29/2019 06/22/2018 06/16/2017 04/05/2016 04/02/2016 02/21/2016  Does Patient Have a Medical Advance Directive? Yes Yes Yes No No No  Type of Paramedic of [place];Living will [place];Living will [place];Living will - - -  Copy of [place] in Chart? No - copy requested No - copy requested No - copy requested - - -  Would patient like information on creating a medical advance directive? - - - No - patient declined information No - patient declined information No - patient declined information    Tobacco Social History   Tobacco Use  Smoking Status Former Smoker  . Types: Cigarettes  . Quit date: 11/25/1998  . Years since quitting: 20.6  Smokeless Tobacco Never Used     Counseling given: Not Answered    Past Medical History:  Diagnosis Date  .  Adenomatous colon polyp 2011  . Barrett esophagus 12/2013   EGD  . CAD (coronary artery disease)    ETT/Lexiscan-Myoview (11/14):  Normal; no ischemia, EF 67%  . CKD (chronic kidney disease), stage III ([place])   . Colon polyp 09/2010   adenoma  . COPD (chronic obstructive pulmonary disease) ([place]) 2009   spirometry with "moderate COPD"  . Depression   . Diabetes mellitus without complication ([place])   . Dyspnea   . GERD (gastroesophageal reflux disease)   . GI bleed    after polypectomy/admitted  . HTN (hypertension)   . Hypercholesterolemia   . Myocardial infarction ([place])   . Obesity   . Sleep apnea   . Spontaneous pneumothorax   . Vertigo    Past Surgical History:  Procedure Laterality Date  . APPENDECTOMY    . PLEURAL SCARIFICATION  1987  . TONSILLECTOMY    . VARICOCELECTOMY     Family History  Problem Relation Age of Onset  . Heart disease Mother   . CVA Mother   . Coronary artery disease Father 58  . Heart disease Father   . Stroke Father   . Cancer - Other Brother   . Hypertension Other   . Hyperlipidemia Other   . Colon polyps Sister   . Cancer Sister    Social History   Socioeconomic History  . Marital status: Married    Spouse name: Not on file  . Number of children: Not on file  . Years of education: Not on file  . Highest education level: Not on file  Occupational History  . Occupation: Furniture conservator/restorer  Social Needs  . Financial resource strain: Not on file  . Food insecurity    Worry: Not on file    Inability: Not on file  . Transportation needs    Medical: Not on file    Non-medical: Not on file  Tobacco Use  . Smoking status: Former Smoker    Types: Cigarettes    Quit date: 11/25/1998    Years since quitting: 20.6  . Smokeless tobacco: Never Used  Substance and Sexual Activity  . Alcohol use: No  . Drug use: No  . Sexual activity: Yes  Lifestyle  . Physical activity    Days per week: Not on file    Minutes per session: Not on file  . Stress:  Not on file  Relationships  . Social Herbalist on phone: Not on file    Gets together: Not on file    Attends religious service: Not on file    Active member of club or organization: Not on file    Attends meetings of clubs or organizations: Not on file    Relationship status: Not on file  Other Topics Concern  . Not on file  Social History Narrative   Married to [place].   Retired Production designer, theatre/television/film truck.    Drinks caffeine,  Takes a daily vitamin, wears a seatbelt   Wears a bicycle helmet.    Has dentures (partial on top)   Smoke detector in the home. No firearms in the home.    Feels safe in relationships.      Outpatient Encounter Medications as of 06/29/2019  Medication Sig  . aspirin EC 81 MG tablet Take 81 mg by mouth daily.   Marland Kitchen atorvastatin (LIPITOR) 80 MG tablet Take 1 tablet (80 mg total) by mouth daily.  Marland Kitchen buPROPion (WELLBUTRIN XL) 300 MG 24 hr tablet Take 1 tablet (300 mg total) by mouth daily.  . clopidogrel (PLAVIX) 75 MG tablet Take 1 tablet (75 mg total) by mouth daily.  . Cyanocobalamin (VITAMIN B 12 PO) Take by mouth.  . diclofenac sodium (VOLTAREN) 1 % GEL Apply 4 g topically 4 (four) times daily. To affected joint.  . iron polysaccharides (NIFEREX) 150 MG capsule Take 1 capsule (150 mg total) by mouth daily.  . metFORMIN (GLUCOPHAGE) 500 MG tablet Take 1 tablet (500 mg total) by mouth daily with breakfast. diabetes  . metoprolol tartrate (LOPRESSOR) 50 MG tablet Take 1 tablet (50 mg total) by mouth daily.  . mirtazapine (REMERON SOL-TAB) 30 MG disintegrating tablet Take 1 tablet (30 mg total) by mouth at bedtime. On tongue  . Multiple Vitamin (MULTIVITAMIN) tablet Take 1 tablet by mouth daily.    . nitroGLYCERIN (NITROSTAT) 0.4 MG SL tablet Place 1 tablet (0.4 mg total) under the tongue every 5 (five) minutes as needed for chest pain (3 doses max).  . pantoprazole (PROTONIX) 40 MG tablet Take 1 tablet (40 mg total) by mouth 2 (two) times daily.  . SYMBICORT 160-4.5  MCG/ACT inhaler Inhale 2 puffs into the lungs 2 (two) times daily.  . traZODone (DESYREL) 50 MG tablet Take 3 tablets (150 mg total) by mouth at bedtime.  . nitroGLYCERIN (NITRODUR - DOSED IN MG/24 HR) 0.2 mg/hr patch 1/4 patch to heel daily for tendonitis (Patient not taking: Reported on 06/29/2019)  . tamsulosin (FLOMAX) 0.4 MG CAPS capsule TAKE 1 CAPSULE BY MOUTH ONCE AT NIGHT AT BEDTIME   No facility-administered encounter medications on file as  of 06/29/2019.     Activities of Daily Living In your present state of health, do you have any difficulty performing the following activities: 06/29/2019  Hearing? N  Vision? N  Difficulty concentrating or making decisions? N  Walking or climbing stairs? N  Dressing or bathing? N  Doing errands, shopping? N  Preparing Food and eating ? N  Using the Toilet? N  In the past six months, have you accidently leaked urine? N  Do you have problems with loss of bowel control? N  Managing your Medications? N  Managing your Finances? N  Housekeeping or managing your Housekeeping? N  Some recent data might be hidden    Patient Care Team: [name], DO as PCP - General (Family Medicine) Josue Hector, MD as PCP - Cardiology (Cardiology) Brunetta Genera, MD as Consulting Physician (Hematology) Josue Hector, MD as Consulting Physician (Cardiology) Wilford Corner, MD as Consulting Physician (Gastroenterology) Melina Schools, OD (Optometry) Kathie Rhodes, MD as Consulting Physician (Urology) Hollar, Katharine Look, MD as Referring Physician (Dermatology)   Assessment:   This is a routine wellness examination for [place].  Exercise Activities and Dietary recommendations Current Exercise Habits: The patient does not participate in regular exercise at present(stays busy with yard work), Exercise limited by: None identified   Diet (meal preparation, eat out, water intake, caffeinated beverages, dairy products, fruits and vegetables): Drinks  water, gatorade and diet pepsi.   Breakfast: cereal; coffee Lunch: sandwich Dinner: protein and veggies  Goals    . Patient Stated     Remain active.     . Patient Stated     Remain active.     . Weight (lb) < 180 lb (81.6 kg)     Lose weight by increasing activity.        Fall Risk Fall Risk  06/29/2019 08/07/2018 06/22/2018 06/16/2017 03/17/2017  Falls in the past year? 0 Yes No No No  Number falls in past yr: 0 1 - - -  Injury with Fall? 0 No - - -  Follow up Falls prevention discussed Education provided;Falls prevention discussed - - -    Depression Screen PHQ 2/9 Scores 06/29/2019 02/12/2019 08/07/2018 06/22/2018  PHQ - 2 Score 0 4 0 2  PHQ- 9 Score - 12 2 3     Cognitive Function MMSE - Mini Mental State Exam 06/29/2019 06/22/2018 06/16/2017  Orientation to time 5 5 5   Orientation to Place 5 5 5   Registration 3 3 3   Attention/ Calculation 5 5 5   Recall 2 0 3  Language- name 2 objects 2 2 2   Language- repeat 1 1 1   Language- follow 3 step command 3 3 3   Language- read & follow direction 1 1 1   Write a sentence 1 1 1   Copy design 1 1 1   Total score 29 27 30         Immunization History  Administered Date(s) Administered  . Influenza, High Dose Seasonal PF 09/03/2016, 09/15/2017, 08/07/2018  . Pneumococcal Conjugate-13 09/03/2016  . Pneumococcal Polysaccharide-23 07/10/2017  . Tdap 01/15/2016     Screening Tests Health Maintenance  Topic Date Due  . INFLUENZA VACCINE  06/26/2019  . HEMOGLOBIN A1C  08/15/2019  . FOOT EXAM  02/12/2020  . URINE MICROALBUMIN  02/12/2020  . OPHTHALMOLOGY EXAM  03/29/2020  . COLONOSCOPY  04/02/2021  . TETANUS/TDAP  01/14/2026  . Hepatitis C Screening  Completed  . PNA vac Low Risk Adult  Completed  Plan:     Shingles vaccine at pharmacy.   Continue doing brain stimulating activities (puzzles, reading, adult coloring books, staying active) to keep memory sharp.   Bring a copy of your living will and/or healthcare power  of attorney to your next office visit.  I have personally reviewed and noted the following in the patient's chart:   . Medical and social history . Use of alcohol, tobacco or illicit drugs  . Current medications and supplements . Functional ability and status . Nutritional status . Physical activity . Advanced directives . List of other physicians . Hospitalizations, surgeries, and ER visits in previous 12 months . Vitals . Screenings to include cognitive, depression, and falls . Referrals and appointments  In addition, I have reviewed and discussed with patient certain preventive protocols, quality metrics, and best practice recommendations. A written personalized care plan for preventive services as well as general preventive health recommendations were provided to patient.     Gerilyn Nestle, RN  06/29/2019  F/U with PCP 07/2019  Medical screening examination/treatment/procedure(s) were performed by non-physician practitioner and as supervising physician I was immediately available for consultation/collaboration.  I agree with above assessment and plan.  Electronically Signed by: Howard Pouch, DO [facility] [place]

## 2019-06-29 ENCOUNTER — Other Ambulatory Visit: Payer: Self-pay

## 2019-06-29 ENCOUNTER — Ambulatory Visit (INDEPENDENT_AMBULATORY_CARE_PROVIDER_SITE_OTHER): Payer: Medicare HMO

## 2019-06-29 DIAGNOSIS — Z0000 Encounter for general adult medical examination without abnormal findings: Secondary | ICD-10-CM | POA: Diagnosis not present

## 2019-06-29 DIAGNOSIS — Z23 Encounter for immunization: Secondary | ICD-10-CM | POA: Diagnosis not present

## 2019-06-29 DIAGNOSIS — E663 Overweight: Secondary | ICD-10-CM | POA: Diagnosis not present

## 2019-06-29 MED ORDER — SHINGRIX 50 MCG/0.5ML IM SUSR: 0.5000 mL | Freq: Once | INTRAMUSCULAR | 1 refills | Status: AC

## 2019-06-29 NOTE — Progress Notes (Signed)
  I connected with patient 06/29/19 at 10:00 AM EDT by a video/audio enabled telemedicine application and verified that I am speaking with the correct person using two identifiers. Patient stated full name and DOB. Patient gave permission to continue with virtual visit. Patient's location was at home and Nurse's location was at [place] office.

## 2019-06-29 NOTE — Patient Instructions (Addendum)
Shingles vaccine at pharmacy.   Continue doing brain stimulating activities (puzzles, reading, adult coloring books, staying active) to keep memory sharp.   Bring a copy of your living will and/or healthcare power of attorney to your next office visit.   Fall Prevention in the Home, Adult Falls can cause injuries. They can happen to people of all ages. There are many things you can do to make your home safe and to help prevent falls. Ask for help when making these changes, if needed. What actions can I take to prevent falls? General Instructions  Use good lighting in all rooms. Replace any light bulbs that burn out.  Turn on the lights when you go into a dark area. Use night-lights.  Keep items that you use often in easy-to-reach places. Lower the shelves around your home if necessary.  Set up your furniture so you have a clear path. Avoid moving your furniture around.  Do not have throw rugs and other things on the floor that can make you trip.  Avoid walking on wet floors.  If any of your floors are uneven, fix them.  Add color or contrast paint or tape to clearly mark and help you see: ? Any grab bars or handrails. ? First and last steps of stairways. ? Where the edge of each step is.  If you use a stepladder: ? Make sure that it is fully opened. Do not climb a closed stepladder. ? Make sure that both sides of the stepladder are locked into place. ? Ask someone to hold the stepladder for you while you use it.  If there are any pets around you, be aware of where they are. What can I do in the bathroom?      Keep the floor dry. Clean up any water that spills onto the floor as soon as it happens.  Remove soap buildup in the tub or shower regularly.  Use non-skid mats or decals on the floor of the tub or shower.  Attach bath mats securely with double-sided, non-slip rug tape.  If you need to sit down in the shower, use a plastic, non-slip stool.  Install grab bars  by the toilet and in the tub and shower. Do not use towel bars as grab bars. What can I do in the bedroom?  Make sure that you have a light by your bed that is easy to reach.  Do not use any sheets or blankets that are too big for your bed. They should not hang down onto the floor.  Have a firm chair that has side arms. You can use this for support while you get dressed. What can I do in the kitchen?  Clean up any spills right away.  If you need to reach something above you, use a strong step stool that has a grab bar.  Keep electrical cords out of the way.  Do not use floor polish or wax that makes floors slippery. If you must use wax, use non-skid floor wax. What can I do with my stairs?  Do not leave any items on the stairs.  Make sure that you have a light switch at the top of the stairs and the bottom of the stairs. If you do not have them, ask someone to add them for you.  Make sure that there are handrails on both sides of the stairs, and use them. Fix handrails that are broken or loose. Make sure that handrails are as long as the stairways.  Install non-slip stair treads on all stairs in your home.  Avoid having throw rugs at the top or bottom of the stairs. If you do have throw rugs, attach them to the floor with carpet tape.  Choose a carpet that does not hide the edge of the steps on the stairway.  Check any carpeting to make sure that it is firmly attached to the stairs. Fix any carpet that is loose or worn. What can I do on the outside of my home?  Use bright outdoor lighting.  Regularly fix the edges of walkways and driveways and fix any cracks.  Remove anything that might make you trip as you walk through a door, such as a raised step or threshold.  Trim any bushes or trees on the path to your home.  Regularly check to see if handrails are loose or broken. Make sure that both sides of any steps have handrails.  Install guardrails along the edges of any  raised decks and porches.  Clear walking paths of anything that might make someone trip, such as tools or rocks.  Have any leaves, snow, or ice cleared regularly.  Use sand or salt on walking paths during winter.  Clean up any spills in your garage right away. This includes grease or oil spills. What other actions can I take?  Wear shoes that: ? Have a low heel. Do not wear high heels. ? Have rubber bottoms. ? Are comfortable and fit you well. ? Are closed at the toe. Do not wear open-toe sandals.  Use tools that help you move around (mobility aids) if they are needed. These include: ? Canes. ? Walkers. ? Scooters. ? Crutches.  Review your medicines with your doctor. Some medicines can make you feel dizzy. This can increase your chance of falling. Ask your doctor what other things you can do to help prevent falls. Where to find more information  Centers for Disease Control and Prevention, STEADI: https://garcia.biz/  [employer] on Aging: BrainJudge.co.uk Contact a doctor if:  You are afraid of falling at home.  You feel weak, drowsy, or dizzy at home.  You fall at home. Summary  There are many simple things that you can do to make your home safe and to help prevent falls.  Ways to make your home safe include removing tripping hazards and installing grab bars in the bathroom.  Ask for help when making these changes in your home. This information is not intended to replace advice given to you by your health care provider. Make sure you discuss any questions you have with your health care provider. Document Released: 09/07/2009 Document Revised: 03/04/2019 Document Reviewed: 06/26/2017 Elsevier Patient Education  2020 [place] Maintenance, Male Adopting a healthy lifestyle and getting preventive care are important in promoting health and wellness. Ask your health care provider about:  The right schedule for you to have regular tests and  exams.  Things you can do on your own to prevent diseases and keep yourself healthy. What should I know about diet, weight, and exercise? Eat a healthy diet   Eat a diet that includes plenty of vegetables, fruits, low-fat dairy products, and lean protein.  Do not eat a lot of foods that are high in solid fats, added sugars, or sodium. Maintain a healthy weight Body mass index (BMI) is a measurement that can be used to identify possible weight problems. It estimates body fat based on height and weight. Your health care provider can help  determine your BMI and help you achieve or maintain a healthy weight. Get regular exercise Get regular exercise. This is one of the most important things you can do for your health. Most adults should:  Exercise for at least 150 minutes each week. The exercise should increase your heart rate and make you sweat (moderate-intensity exercise).  Do strengthening exercises at least twice a week. This is in addition to the moderate-intensity exercise.  Spend less time sitting. Even light physical activity can be beneficial. Watch cholesterol and blood lipids Have your blood tested for lipids and cholesterol at 72 years of age, then have this test every 5 years. You may need to have your cholesterol levels checked more often if:  Your lipid or cholesterol levels are high.  You are older than 72 years of age.  You are at high risk for heart disease. What should I know about cancer screening? Many types of cancers can be detected early and may often be prevented. Depending on your health history and family history, you may need to have cancer screening at various ages. This may include screening for:  Colorectal cancer.  Prostate cancer.  Skin cancer.  Lung cancer. What should I know about heart disease, diabetes, and high blood pressure? Blood pressure and heart disease  High blood pressure causes heart disease and increases the risk of stroke. This  is more likely to develop in people who have high blood pressure readings, are of African descent, or are overweight.  Talk with your health care provider about your target blood pressure readings.  Have your blood pressure checked: ? Every 3-5 years if you are 57-28 years of age. ? Every year if you are 46 years old or older.  If you are between the ages of 73 and 3 and are a current or former smoker, ask your health care provider if you should have a one-time screening for abdominal aortic aneurysm (AAA). Diabetes Have regular diabetes screenings. This checks your fasting blood sugar level. Have the screening done:  Once every three years after age 79 if you are at a normal weight and have a low risk for diabetes.  More often and at a younger age if you are overweight or have a high risk for diabetes. What should I know about preventing infection? Hepatitis B If you have a higher risk for hepatitis B, you should be screened for this virus. Talk with your health care provider to find out if you are at risk for hepatitis B infection. Hepatitis C Blood testing is recommended for:  Everyone born from [birth year] through 1964.  Anyone with known risk factors for hepatitis C. Sexually transmitted infections (STIs)  You should be screened each year for STIs, including gonorrhea and chlamydia, if: ? You are sexually active and are younger than 72 years of age. ? You are older than 72 years of age and your health care provider tells you that you are at risk for this type of infection. ? Your sexual activity has changed since you were last screened, and you are at increased risk for chlamydia or gonorrhea. Ask your health care provider if you are at risk.  Ask your health care provider about whether you are at high risk for HIV. Your health care provider may recommend a prescription medicine to help prevent HIV infection. If you choose to take medicine to prevent HIV, you should first get tested for  HIV. You should then be tested every 3 months for as long  as you are taking the medicine. Follow these instructions at home: Lifestyle  Do not use any products that contain nicotine or tobacco, such as cigarettes, e-cigarettes, and chewing tobacco. If you need help quitting, ask your health care provider.  Do not use street drugs.  Do not share needles.  Ask your health care provider for help if you need support or information about quitting drugs. Alcohol use  Do not drink alcohol if your health care provider tells you not to drink.  If you drink alcohol: ? Limit how much you have to 0-2 drinks a day. ? Be aware of how much alcohol is in your drink. In the [place] one drink equals one 12 oz bottle of beer (355 mL), one 5 oz glass of wine (148 mL), or one 1 oz glass of hard liquor (44 mL). General instructions  Schedule regular health, dental, and eye exams.  Stay current with your vaccines.  Tell your health care provider if: ? You often feel depressed. ? You have ever been abused or do not feel safe at home. Summary  Adopting a healthy lifestyle and getting preventive care are important in promoting health and wellness.  Follow your health care provider's instructions about healthy diet, exercising, and getting tested or screened for diseases.  Follow your health care provider's instructions on monitoring your cholesterol and blood pressure. This information is not intended to replace advice given to you by your health care provider. Make sure you discuss any questions you have with your health care provider. Document Released: 05/09/2008 Document Revised: 11/04/2018 Document Reviewed: 11/04/2018 Elsevier Patient Education  2020 [employer].

## 2019-08-11 ENCOUNTER — Other Ambulatory Visit: Payer: Self-pay | Admitting: Family Medicine

## 2019-08-11 DIAGNOSIS — F325 Major depressive disorder, single episode, in full remission: Secondary | ICD-10-CM

## 2019-08-11 DIAGNOSIS — F3341 Major depressive disorder, recurrent, in partial remission: Secondary | ICD-10-CM

## 2019-08-11 DIAGNOSIS — E119 Type 2 diabetes mellitus without complications: Secondary | ICD-10-CM

## 2019-08-11 NOTE — Telephone Encounter (Signed)
Pt has an appt next week- please make sure he has enough meds to last. Otherwise wait on refills until his appt/

## 2019-08-11 NOTE — Telephone Encounter (Signed)
Called patient and lvm to call me back if he needs refills until his appointment next week

## 2019-08-13 ENCOUNTER — Ambulatory Visit: Payer: Medicare HMO | Admitting: Family Medicine

## 2019-08-13 ENCOUNTER — Other Ambulatory Visit: Payer: Self-pay

## 2019-08-13 NOTE — Telephone Encounter (Signed)
Pt was called and said that he could wait for refills until F/U appt next week

## 2019-08-18 ENCOUNTER — Ambulatory Visit (INDEPENDENT_AMBULATORY_CARE_PROVIDER_SITE_OTHER): Payer: Medicare HMO | Admitting: Family Medicine

## 2019-08-18 ENCOUNTER — Other Ambulatory Visit: Payer: Self-pay

## 2019-08-18 ENCOUNTER — Encounter: Payer: Self-pay | Admitting: Family Medicine

## 2019-08-18 VITALS — BP 148/74 | HR 68 | Temp 98.0°F | Resp 18 | Ht 70.0 in | Wt 206.0 lb

## 2019-08-18 DIAGNOSIS — K219 Gastro-esophageal reflux disease without esophagitis: Secondary | ICD-10-CM

## 2019-08-18 DIAGNOSIS — F3341 Major depressive disorder, recurrent, in partial remission: Secondary | ICD-10-CM

## 2019-08-18 DIAGNOSIS — E119 Type 2 diabetes mellitus without complications: Secondary | ICD-10-CM | POA: Diagnosis not present

## 2019-08-18 DIAGNOSIS — F325 Major depressive disorder, single episode, in full remission: Secondary | ICD-10-CM | POA: Diagnosis not present

## 2019-08-18 DIAGNOSIS — E663 Overweight: Secondary | ICD-10-CM

## 2019-08-18 DIAGNOSIS — I2510 Atherosclerotic heart disease of native coronary artery without angina pectoris: Secondary | ICD-10-CM

## 2019-08-18 DIAGNOSIS — D500 Iron deficiency anemia secondary to blood loss (chronic): Secondary | ICD-10-CM

## 2019-08-18 DIAGNOSIS — I252 Old myocardial infarction: Secondary | ICD-10-CM

## 2019-08-18 DIAGNOSIS — J449 Chronic obstructive pulmonary disease, unspecified: Secondary | ICD-10-CM | POA: Diagnosis not present

## 2019-08-18 DIAGNOSIS — I10 Essential (primary) hypertension: Secondary | ICD-10-CM

## 2019-08-18 DIAGNOSIS — E7800 Pure hypercholesterolemia, unspecified: Secondary | ICD-10-CM

## 2019-08-18 DIAGNOSIS — Z23 Encounter for immunization: Secondary | ICD-10-CM | POA: Diagnosis not present

## 2019-08-18 LAB — CBC
HCT: 36.2 % — ABNORMAL LOW (ref 39.0–52.0)
Hemoglobin: 12.3 g/dL — ABNORMAL LOW (ref 13.0–17.0)
MCHC: 33.9 g/dL (ref 30.0–36.0)
MCV: 84.1 fl (ref 78.0–100.0)
Platelets: 183 10*3/uL (ref 150.0–400.0)
RBC: 4.3 Mil/uL (ref 4.22–5.81)
RDW: 15.4 % (ref 11.5–15.5)
WBC: 7 10*3/uL (ref 4.0–10.5)

## 2019-08-18 LAB — COMPREHENSIVE METABOLIC PANEL
ALT: 28 U/L (ref 0–53)
AST: 17 U/L (ref 0–37)
Albumin: 4.1 g/dL (ref 3.5–5.2)
Alkaline Phosphatase: 67 U/L (ref 39–117)
BUN: 18 mg/dL (ref 6–23)
CO2: 27 mEq/L (ref 19–32)
Calcium: 9.7 mg/dL (ref 8.4–10.5)
Chloride: 100 mEq/L (ref 96–112)
Creatinine, Ser: 1.16 mg/dL (ref 0.40–1.50)
GFR: 61.92 mL/min (ref >60.00)
Glucose, Bld: 154 mg/dL — ABNORMAL HIGH (ref 70–99)
Potassium: 4.4 mEq/L (ref 3.5–5.1)
Sodium: 139 mEq/L (ref 135–145)
Total Bilirubin: 0.7 mg/dL (ref 0.2–1.2)
Total Protein: 6.7 g/dL (ref 6.0–8.3)

## 2019-08-18 LAB — LIPID PANEL
Cholesterol: 136 mg/dL (ref 0–200)
HDL: 43.5 mg/dL (ref >39.00)
NonHDL: 92.91
Total CHOL/HDL Ratio: 3
Triglycerides: 262 mg/dL — ABNORMAL HIGH (ref 0.0–149.0)
VLDL: 52.4 mg/dL — ABNORMAL HIGH (ref 0.0–40.0)

## 2019-08-18 LAB — TSH: TSH: 1.76 u[IU]/mL (ref 0.35–4.50)

## 2019-08-18 LAB — LDL CHOLESTEROL, DIRECT: Direct LDL: 59 mg/dL

## 2019-08-18 LAB — POCT GLYCOSYLATED HEMOGLOBIN (HGB A1C)
HbA1c POC (<> result, manual entry): 6.5 % (ref 4.0–5.6)
HbA1c, POC (controlled diabetic range): 6.5 % (ref 0.0–7.0)
HbA1c, POC (prediabetic range): 6.5 % — AB (ref 5.7–6.4)
Hemoglobin A1C: 6.5 % — AB (ref 4.0–5.6)

## 2019-08-18 MED ORDER — PANTOPRAZOLE SODIUM 40 MG PO TBEC: 40.0000 mg | DELAYED_RELEASE_TABLET | Freq: Two times a day (BID) | ORAL | 3 refills | Status: DC

## 2019-08-18 MED ORDER — TRAZODONE HCL 50 MG PO TABS: 150.0000 mg | ORAL_TABLET | Freq: Every day | ORAL | 1 refills | Status: DC

## 2019-08-18 MED ORDER — ATORVASTATIN CALCIUM 80 MG PO TABS: 80.0000 mg | ORAL_TABLET | Freq: Every day | ORAL | 3 refills | Status: DC

## 2019-08-18 MED ORDER — BUPROPION HCL ER (XL) 300 MG PO TB24: 300.0000 mg | ORAL_TABLET | Freq: Every day | ORAL | 1 refills | Status: DC

## 2019-08-18 MED ORDER — SYMBICORT 160-4.5 MCG/ACT IN AERO: 2.0000 | INHALATION_SPRAY | Freq: Two times a day (BID) | RESPIRATORY_TRACT | 3 refills | Status: DC

## 2019-08-18 MED ORDER — MIRTAZAPINE 30 MG PO TBDP: 30.0000 mg | ORAL_TABLET | Freq: Every day | ORAL | 1 refills | Status: DC

## 2019-08-18 MED ORDER — METOPROLOL TARTRATE 50 MG PO TABS: 50.0000 mg | ORAL_TABLET | Freq: Every day | ORAL | 1 refills | Status: DC

## 2019-08-18 MED ORDER — METFORMIN HCL 500 MG PO TABS: 500.0000 mg | ORAL_TABLET | Freq: Every day | ORAL | 1 refills | Status: DC

## 2019-08-18 NOTE — Patient Instructions (Signed)
Great to see you today.  Your a1c was 6.5- that is great.  Continue medications as prescribed. They were refilled today.    Follow up in 5 mos. The front desk will call you to schedule.

## 2019-08-18 NOTE — Progress Notes (Signed)
[name] , November 15, 1946, 73 y.o., male MRN: [id number] Patient Care Team    Relationship Specialty Notifications Start End  [name], DO PCP - General Family Medicine  12/25/15   Josue Hector, MD PCP - Cardiology Cardiology Admissions 08/20/18   Brunetta Genera, MD Consulting Physician Hematology  09/03/16   Josue Hector, MD Consulting Physician Cardiology  09/03/16   Wilford Corner, MD Consulting Physician Gastroenterology  09/03/16   Melina Schools, OD  Optometry  09/03/16   Kathie Rhodes, MD Consulting Physician Urology  09/10/16   Hollar, Katharine Look, MD Referring Physician Dermatology  06/29/19     Chief Complaint  Patient presents with  . Diabetes    Not fasting. Pt has not had BP medications this AM.     Subjective:  Recurrent major depressive disorder, in partial remission [facility]) Patient feels that his depression is controlled. "doing good.". Current medication regimen of trazodone, Remeron and Wellbutrin. He denies negative side effects.    Gastroesophageal reflux disease, esophagitis presence not specified  EGD 03/2016 by Dr. Michail Sermon with signs of barrett's esophagus. Patient was tried on lower dose medication, and was unable to tolerate secondary to return of symptoms.  His symptoms are controlled protonix 40 BID.   COPD ([place]) He uses Symbicort daily, Spiriva needed during certain times of the year only. His pneumonia series is completed.  COPD is well controlled.   Atherosclerosis of native coronary artery of native heart without angina pectoris/Essential hypertension/HYPERCHOLESTEROLEMIA/History of MI (myocardial infarction)/IDA Pt reports compliance with metoprolol 50 mg daily. Blood pressures ranges at home are not routinely checked.Patient denies chest pain, shortness of breath, dizziness or lower extremity edema.   Pt takes a daily baby ASA and Plavix. Pt is  prescribed statin and followed by Dr. Johnsie Cancel Cardiology. BMP: 08/07/2018 within  normal limits CBC: 08/07/2018 hemoglobin 12.8, hematocrit 37.9, MCV 83.3, iron 64, TIBC 273, percent saturation 23, ferritin 162--> he is taking oral iron supplement Lipids: 08/07/2018, cholesterol 124, HDL 41.9, LDL 50 triglycerides 162 Diet: Low-sodium RF: Hypertension, hyperlipidemia, diabetes  Type 2 diabetes mellitus without complication, without long-term current use of insulin ([place]) patient reports compliance with metformin 500 mg QD  Pt reports BG ranges not routinely checked. - Foot exam: 02/12/2019 - eye exam: 75/03/2019 - PNA: Series completed - Flu: UTD completed today , encouraged yearly - Urine Microalbumin w/creat. Ratio: completed 02/12/2019   Depression screen [facility] 2/9 08/18/2019 06/29/2019 02/12/2019 08/07/2018 06/22/2018  Decreased Interest 3 0 2 0 1  Down, Depressed, Hopeless 1 0 2 0 1  PHQ - 2 Score 4 0 4 0 2  Altered sleeping 0 0 1 1 0  Tired, decreased energy 1 0 2 0 0  Change in appetite 1 0 3 0 0  Feeling bad or failure about yourself  0 0 1 0 1  Trouble concentrating 0 0 1 1 0  Moving slowly or fidgety/restless 0 0 0 0 0  Suicidal thoughts 0 0 0 0 0  PHQ-9 Score 6 0 12 2 3   Difficult doing work/chores - Not difficult at all Somewhat difficult Not difficult at all Somewhat difficult  Some recent data might be hidden   GAD 7 : Generalized Anxiety Score 02/12/2019 12/25/2015  Nervous, Anxious, on Edge 0 2  Control/stop worrying 3 2  Worry too much - different things 3 3  Trouble relaxing 3 2  Restless 3 2  Easily annoyed or irritable 3 2  Afraid - awful  might happen 0 0  Total GAD 7 Score 15 13  Anxiety Difficulty Somewhat difficult Somewhat difficult    Allergies  Allergen Reactions  . Altace [Ramipril] Cough  . Codeine     Makes sick  . Tape     Blisters   . Zantac [Ranitidine Hcl] Dermatitis   Social History   Tobacco Use  . Smoking status: Former Smoker    Types: Cigarettes    Quit date: 11/25/1998    Years since quitting: 20.7  . Smokeless  tobacco: Never Used  Substance Use Topics  . Alcohol use: No   Past Medical History:  Diagnosis Date  . Adenomatous colon polyp 2011  . Barrett esophagus 12/2013   EGD  . CAD (coronary artery disease)    ETT/Lexiscan-Myoview (11/14):  Normal; no ischemia, EF 67%  . CKD (chronic kidney disease), stage III ([place])   . Colon polyp 09/2010   adenoma  . COPD (chronic obstructive pulmonary disease) ([place]) 2009   spirometry with "moderate COPD"  . Depression   . Diabetes mellitus without complication ([place])   . Dyspnea   . GERD (gastroesophageal reflux disease)   . GI bleed    after polypectomy/admitted  . HTN (hypertension)   . Hypercholesterolemia   . Myocardial infarction ([place])   . Obesity   . Sleep apnea   . Spontaneous pneumothorax   . Vertigo    Past Surgical History:  Procedure Laterality Date  . APPENDECTOMY    . PLEURAL SCARIFICATION  1987  . TONSILLECTOMY    . VARICOCELECTOMY     Family History  Problem Relation Age of Onset  . Heart disease Mother   . CVA Mother   . Coronary artery disease Father 21  . Heart disease Father   . Stroke Father   . Cancer - Other Brother   . Hypertension Other   . Hyperlipidemia Other   . Colon polyps Sister   . Cancer Sister    Allergies as of 08/18/2019      Reactions   Altace [ramipril] Cough   Codeine    Makes sick   Tape    Blisters   Zantac [ranitidine Hcl] Dermatitis      Medication List       Accurate as of August 18, 2019 10:00 AM. If you have any questions, ask your nurse or doctor.        aspirin EC 81 MG tablet Take 81 mg by mouth daily.   atorvastatin 80 MG tablet Commonly known as: LIPITOR Take 1 tablet (80 mg total) by mouth daily.   buPROPion 300 MG 24 hr tablet Commonly known as: WELLBUTRIN XL Take 1 tablet (300 mg total) by mouth daily.   clopidogrel 75 MG tablet Commonly known as: PLAVIX Take 1 tablet (75 mg total) by mouth daily.   diclofenac sodium 1 % Gel Commonly known as: VOLTAREN  Apply 4 g topically 4 (four) times daily. To affected joint.   iron polysaccharides 150 MG capsule Commonly known as: NIFEREX Take 1 capsule (150 mg total) by mouth daily.   metFORMIN 500 MG tablet Commonly known as: GLUCOPHAGE Take 1 tablet (500 mg total) by mouth daily with breakfast. diabetes   metoprolol tartrate 50 MG tablet Commonly known as: LOPRESSOR Take 1 tablet (50 mg total) by mouth daily.   mirtazapine 30 MG disintegrating tablet Commonly known as: REMERON SOL-TAB Take 1 tablet (30 mg total) by mouth at bedtime. On tongue   multivitamin tablet Take 1 tablet by  mouth daily.   nitroGLYCERIN 0.2 mg/hr patch Commonly known as: NITRODUR - Dosed in mg/24 hr 1/4 patch to heel daily for tendonitis   nitroGLYCERIN 0.4 MG SL tablet Commonly known as: NITROSTAT Place 1 tablet (0.4 mg total) under the tongue every 5 (five) minutes as needed for chest pain (3 doses max).   pantoprazole 40 MG tablet Commonly known as: PROTONIX Take 1 tablet (40 mg total) by mouth 2 (two) times daily.   Symbicort 160-4.5 MCG/ACT inhaler Generic drug: budesonide-formoterol Inhale 2 puffs into the lungs 2 (two) times daily.   tamsulosin 0.4 MG Caps capsule Commonly known as: FLOMAX TAKE 1 CAPSULE BY MOUTH ONCE AT NIGHT AT BEDTIME   traZODone 50 MG tablet Commonly known as: DESYREL Take 3 tablets (150 mg total) by mouth at bedtime.   VITAMIN B 12 PO Take by mouth.       Results for orders placed or performed in visit on 08/18/19 (from the past 24 hour(s))  POCT glycosylated hemoglobin (Hb A1C)     Status: Abnormal   Collection Time: 08/18/19  9:56 AM  Result Value Ref Range   Hemoglobin A1C 6.5 (A) 4.0 - 5.6 %   HbA1c POC (<> result, manual entry) 6.5 4.0 - 5.6 %   HbA1c, POC (prediabetic range) 6.5 (A) 5.7 - 6.4 %   HbA1c, POC (controlled diabetic range) 6.5 0.0 - 7.0 %   No results found.   ROS: Negative, with the exception of above mentioned in HPI   Objective:  BP  (!) 148/74 (BP Location: Left Arm, Patient Position: Sitting, Cuff Size: Normal)   Pulse 68   Temp 98 F (36.7 C) (Temporal)   Resp 18   Ht 5\' 10"  (1.778 m)   Wt 206 lb (93.4 kg)   SpO2 98%   BMI 29.56 kg/m  Body mass index is 29.56 kg/m. Gen: Afebrile. No acute distress. Pleasant caucasian male.  HENT: AT. [place]. Bilateral TM visualized and normal in appearance. MMM. Eyes:Pupils Equal Round Reactive to light, Extraocular movements intact,  Conjunctiva without redness, discharge or icterus. Neck/lymp/endocrine: Supple,no lymphadenopathy, no thyromegaly CV: RRR no murmur, no edema, +2/4 P posterior tibialis pulses Chest: CTAB, no wheeze or crackles Abd: Soft. NTND. BS present. no Masses palpated.   Neuro:  Normal gait. PERLA. EOMi. Alert. Oriented x3 Psych: Normal affect, dress and demeanor. Normal speech. Normal thought content and judgment.  Results for orders placed or performed in visit on 08/18/19 (from the past 48 hour(s))  POCT glycosylated hemoglobin (Hb A1C)     Status: Abnormal   Collection Time: 08/18/19  9:56 AM  Result Value Ref Range   Hemoglobin A1C 6.5 (A) 4.0 - 5.6 %   HbA1c POC (<> result, manual entry) 6.5 4.0 - 5.6 %   HbA1c, POC (prediabetic range) 6.5 (A) 5.7 - 6.4 %   HbA1c, POC (controlled diabetic range) 6.5 0.0 - 7.0 %    Assessment/Plan: JERMALE LABRUM is a 73 y.o. male present for OV for  Recurrent major depressive disorder, in partial remission ([place]) - stable   -  Continue current regimen, refills provided today - Continue  Remeron 30 mg QHS.  - continue trazodone 2-3 tabs QHS PRN - Continue wellbutrin 300 QD.  - Follow-up 5 months  Gastroesophageal reflux disease, esophagitis presence not specified -stable.  Continue  Protonix 40 mg twice a day, was unable to take back secondary to recurrence of symptoms. Refills provided today - B 12, vitamin D and magnesium levels  collected at least every other year.  Due 07/2020 - pantoprazole (PROTONIX) 40 MG  tablet; Take 1 tablet (40 mg total) by mouth 2 (two) times daily.  Dispense: 180 tablet; Refill: 1  Chronic bronchitis, unspecified chronic bronchitis type (HCC) -stable  Continue Symbicort. Can hold on Spiriva if feeling okay on current regimen. May need to add the Spiriva during colder months if that is a trigger for him - refills provided today  Atherosclerosis of native coronary artery of native heart without angina pectoris/Essential hypertension/HYPERCHOLESTEROLEMIA/History of MI (myocardial infarction) Stage 3 chronic kidney disease/ iron deficiency anemia - following with cards, Dr. Johnsie Cancel. - Pt has not taken his BP meds today. - Continue/refilled statin, metoprolol - ASA and plavix managed by cardio - RF control of DM and HTN are both stable.  - low sodium diet and exercise encouraged.   Type 2 diabetes mellitus without complication, without long-term current use of insulin (HCC) - A1c today 5.8--> 6.0-->6.1>>6.7> 6.5 today.  -  metformin QD continued - Foot exam: 02/12/2019 - eye exam: 75/03/2019 - PNA: Series completed - Flu: UTD completed today , encouraged yearly - Urine Microalbumin w/creat. Ratio: completed 02/12/2019 F/u 5 mos  Influenza vaccine completed today   Reviewed expectations re: course of current medical issues.  Discussed self-management of symptoms.  Outlined signs and symptoms indicating need for more acute intervention.  Patient verbalized understanding and all questions were answered.  Patient received an After-Visit Summary.   electronically signed by:  Howard Pouch, DO  [place]

## 2019-08-19 LAB — IRON,TIBC AND FERRITIN PANEL
%SAT: 26 % (calc) (ref 20–48)
Ferritin: 119 ng/mL (ref 24–380)
Iron: 69 ug/dL (ref 50–180)
TIBC: 270 mcg/dL (calc) (ref 250–425)

## 2019-09-16 DIAGNOSIS — N3000 Acute cystitis without hematuria: Secondary | ICD-10-CM | POA: Diagnosis not present

## 2019-09-16 DIAGNOSIS — R972 Elevated prostate specific antigen [PSA]: Secondary | ICD-10-CM | POA: Diagnosis not present

## 2019-09-16 DIAGNOSIS — R8279 Other abnormal findings on microbiological examination of urine: Secondary | ICD-10-CM | POA: Diagnosis not present

## 2019-09-21 DIAGNOSIS — Z08 Encounter for follow-up examination after completed treatment for malignant neoplasm: Secondary | ICD-10-CM | POA: Diagnosis not present

## 2019-09-21 DIAGNOSIS — L720 Epidermal cyst: Secondary | ICD-10-CM | POA: Diagnosis not present

## 2019-09-21 DIAGNOSIS — D692 Other nonthrombocytopenic purpura: Secondary | ICD-10-CM | POA: Diagnosis not present

## 2019-09-21 DIAGNOSIS — L821 Other seborrheic keratosis: Secondary | ICD-10-CM | POA: Diagnosis not present

## 2019-09-21 DIAGNOSIS — Z85828 Personal history of other malignant neoplasm of skin: Secondary | ICD-10-CM | POA: Diagnosis not present

## 2019-10-12 DIAGNOSIS — R972 Elevated prostate specific antigen [PSA]: Secondary | ICD-10-CM | POA: Diagnosis not present

## 2019-10-13 LAB — PSA: PSA: 10.9

## 2019-11-04 DIAGNOSIS — N411 Chronic prostatitis: Secondary | ICD-10-CM | POA: Diagnosis not present

## 2019-11-04 DIAGNOSIS — R350 Frequency of micturition: Secondary | ICD-10-CM | POA: Diagnosis not present

## 2020-01-11 DIAGNOSIS — R3915 Urgency of urination: Secondary | ICD-10-CM | POA: Diagnosis not present

## 2020-01-18 DIAGNOSIS — R350 Frequency of micturition: Secondary | ICD-10-CM | POA: Diagnosis not present

## 2020-01-20 ENCOUNTER — Ambulatory Visit: Payer: Medicare HMO | Attending: Internal Medicine

## 2020-01-20 DIAGNOSIS — Z23 Encounter for immunization: Secondary | ICD-10-CM

## 2020-01-20 NOTE — Progress Notes (Signed)
   Covid-19 Vaccination Clinic  Name:  [name]    MRN: [id number] DOB: 1946/02/18  01/20/2020  [name] was observed post Covid-19 immunization for 15 minutes without incidence. He was provided with Vaccine Information Sheet and instruction to access the V-Safe system.   [name] was instructed to call 911 with any severe reactions post vaccine: Marland Kitchen Difficulty breathing  . Swelling of your face and throat  . A fast heartbeat  . A bad rash all over your body  . Dizziness and weakness    Immunizations Administered    Name Date Dose VIS Date Route   Pfizer COVID-19 Vaccine 01/20/2020 12:16 PM 0.3 mL 11/05/2019 Intramuscular   Manufacturer: [place]   Lot: [id number]   [place]: [id number]

## 2020-01-25 DIAGNOSIS — R350 Frequency of micturition: Secondary | ICD-10-CM | POA: Diagnosis not present

## 2020-02-01 DIAGNOSIS — R350 Frequency of micturition: Secondary | ICD-10-CM | POA: Diagnosis not present

## 2020-02-08 DIAGNOSIS — R350 Frequency of micturition: Secondary | ICD-10-CM | POA: Diagnosis not present

## 2020-02-09 ENCOUNTER — Ambulatory Visit: Payer: Medicare HMO | Attending: Internal Medicine

## 2020-02-09 DIAGNOSIS — Z23 Encounter for immunization: Secondary | ICD-10-CM

## 2020-02-09 NOTE — Progress Notes (Signed)
   Covid-19 Vaccination Clinic  Name:  SELDON ILLES    MRN: [id number] DOB: 1946-02-14  02/09/2020  Mr. Torre was observed post Covid-19 immunization for 15 minutes without incident. He was provided with Vaccine Information Sheet and instruction to access the V-Safe system.   Mr. Ciaburri was instructed to call 911 with any severe reactions post vaccine: Marland Kitchen Difficulty breathing  . Swelling of face and throat  . A fast heartbeat  . A bad rash all over body  . Dizziness and weakness   Immunizations Administered    Name Date Dose VIS Date Route   Pfizer COVID-19 Vaccine 02/09/2020 12:52 PM 0.3 mL 11/05/2019 Intramuscular   Manufacturer: [place]   Lot: [id number]   [place]: [id number]

## 2020-02-15 DIAGNOSIS — R3915 Urgency of urination: Secondary | ICD-10-CM | POA: Diagnosis not present

## 2020-02-15 DIAGNOSIS — R350 Frequency of micturition: Secondary | ICD-10-CM | POA: Diagnosis not present

## 2020-02-21 ENCOUNTER — Other Ambulatory Visit: Payer: Self-pay | Admitting: Family Medicine

## 2020-02-21 DIAGNOSIS — F325 Major depressive disorder, single episode, in full remission: Secondary | ICD-10-CM

## 2020-02-21 DIAGNOSIS — E119 Type 2 diabetes mellitus without complications: Secondary | ICD-10-CM

## 2020-02-22 DIAGNOSIS — R3915 Urgency of urination: Secondary | ICD-10-CM | POA: Diagnosis not present

## 2020-02-22 DIAGNOSIS — R350 Frequency of micturition: Secondary | ICD-10-CM | POA: Diagnosis not present

## 2020-02-29 DIAGNOSIS — R350 Frequency of micturition: Secondary | ICD-10-CM | POA: Diagnosis not present

## 2020-03-07 DIAGNOSIS — R350 Frequency of micturition: Secondary | ICD-10-CM | POA: Diagnosis not present

## 2020-03-07 DIAGNOSIS — R3915 Urgency of urination: Secondary | ICD-10-CM | POA: Diagnosis not present

## 2020-03-08 ENCOUNTER — Ambulatory Visit (INDEPENDENT_AMBULATORY_CARE_PROVIDER_SITE_OTHER): Payer: Medicare HMO | Admitting: Family Medicine

## 2020-03-08 ENCOUNTER — Encounter: Payer: Self-pay | Admitting: Family Medicine

## 2020-03-08 ENCOUNTER — Other Ambulatory Visit: Payer: Self-pay

## 2020-03-08 ENCOUNTER — Telehealth: Payer: Self-pay

## 2020-03-08 VITALS — BP 138/72 | HR 61 | Temp 98.0°F | Resp 17 | Ht 70.0 in | Wt 211.5 lb

## 2020-03-08 DIAGNOSIS — L255 Unspecified contact dermatitis due to plants, except food: Secondary | ICD-10-CM

## 2020-03-08 MED ORDER — TRIAMCINOLONE ACETONIDE 0.1 % EX CREA: 1.0000 "application " | TOPICAL_CREAM | Freq: Two times a day (BID) | CUTANEOUS | 0 refills | Status: DC

## 2020-03-08 MED ORDER — PREDNISONE 20 MG PO TABS: ORAL_TABLET | ORAL | 0 refills | Status: DC

## 2020-03-08 MED ORDER — METHYLPREDNISOLONE ACETATE 80 MG/ML IJ SUSP
80.0000 mg | Freq: Once | INTRAMUSCULAR | Status: AC
  Administered 2020-03-08: 16:00:00 80 mg via INTRAMUSCULAR

## 2020-03-08 NOTE — Telephone Encounter (Signed)
Pt was called and [facility] appt was made for 03/10/2020.

## 2020-03-08 NOTE — Telephone Encounter (Signed)
Correction--Pt has appt Acute appt for today for poison ivy- Pt needs to schedule [facility] visit with Dr Raoul Pitch for refills on medications

## 2020-03-08 NOTE — Progress Notes (Signed)
This visit occurred during the SARS-CoV-2 public health emergency.  Safety protocols were in place, including screening questions prior to the visit, additional usage of staff PPE, and extensive cleaning of exam room while observing appropriate contact time as indicated for disinfecting solutions.    [name] , 1946/01/06, 74 y.o., male MRN: [id number] Patient Care Team    Relationship Specialty Notifications Start End  Todd Hillock, DO PCP - General Family Medicine  12/25/15   Todd Hector, MD PCP - Cardiology Cardiology Admissions 08/20/18   Todd Genera, MD Consulting Physician Hematology  09/03/16   Todd Hector, MD Consulting Physician Cardiology  09/03/16   Todd Corner, MD Consulting Physician Gastroenterology  09/03/16   [name], OD  Optometry  09/03/16   Todd Rhodes, MD Consulting Physician Urology  09/10/16   Hollar, Katharine Look, MD Referring Physician Dermatology  06/29/19     Chief Complaint  Patient presents with  . Poison Ivy    Pt believes he has [place] that started about 1.5 weeks ago on bilateral forearms. Started about 1.5 weeks ago. Itches and burns.      Subjective: Pt presents for an OV with complaints of rash.  Patient reports he was working out in his yard about a week and half ago in which he believes he was exposed to [place].  He reports development of rash on his bilateral forearms that is spreading of his arms and now onto his abdomen.  He states he tried everything over-the-counter possible has not been as effective.  He states the rash itches and burns.  He did have poison sumac many years ago and states this is a similar reaction.  Depression screen [facility] 2/9 08/18/2019 06/29/2019 02/12/2019 08/07/2018 06/22/2018  Decreased Interest 3 0 2 0 1  Down, Depressed, Hopeless 1 0 2 0 1  PHQ - 2 Score 4 0 4 0 2  Altered sleeping 0 0 1 1 0  Tired, decreased energy 1 0 2 0 0  Change in appetite 1 0 3 0 0  Feeling bad or  failure about yourself  0 0 1 0 1  Trouble concentrating 0 0 1 1 0  Moving slowly or fidgety/restless 0 0 0 0 0  Suicidal thoughts 0 0 0 0 0  PHQ-9 Score 6 0 12 2 3   Difficult doing work/chores - Not difficult at all Somewhat difficult Not difficult at all Somewhat difficult  Some recent data might be hidden    Allergies  Allergen Reactions  . Altace [Ramipril] Cough  . Codeine     Makes sick  . Tape     Blisters   . Zantac [Ranitidine Hcl] Dermatitis   Social History   Social History Narrative   Married to [place].   Retired Production designer, theatre/television/film truck.    Drinks caffeine,  Takes a daily vitamin, wears a seatbelt   Wears a bicycle helmet.    Has dentures (partial on top)   Smoke detector in the home. No firearms in the home.    Feels safe in relationships.     Past Medical History:  Diagnosis Date  . Adenomatous colon polyp 2011  . Barrett esophagus 12/2013   EGD  . CAD (coronary artery disease)    ETT/Lexiscan-Myoview (11/14):  Normal; no ischemia, EF 67%  . CKD (chronic kidney disease), stage III   . Colon polyp 09/2010   adenoma  . COPD (chronic obstructive pulmonary disease) ([place]) 2009  spirometry with "moderate COPD"  . Depression   . Diabetes mellitus without complication ([place])   . Dyspnea   . GERD (gastroesophageal reflux disease)   . GI bleed    after polypectomy/admitted  . HTN (hypertension)   . Hypercholesterolemia   . Myocardial infarction ([place])   . Obesity   . Sleep apnea   . Spontaneous pneumothorax   . Vertigo    Past Surgical History:  Procedure Laterality Date  . APPENDECTOMY    . PLEURAL SCARIFICATION  1987  . TONSILLECTOMY    . VARICOCELECTOMY     Family History  Problem Relation Age of Onset  . Heart disease Mother   . CVA Mother   . Coronary artery disease Father 36  . Heart disease Father   . Stroke Father   . Cancer - Other Brother   . Hypertension Other   . Hyperlipidemia Other   . Colon polyps Sister   . Cancer Sister    Allergies as  of 03/08/2020      Reactions   Altace [ramipril] Cough   Codeine    Makes sick   Tape    Blisters   Zantac [ranitidine Hcl] Dermatitis      Medication List       Accurate as of March 08, 2020  5:03 PM. If you have any questions, ask your nurse or doctor.        aspirin EC 81 MG tablet Take 81 mg by mouth daily.   atorvastatin 80 MG tablet Commonly known as: LIPITOR Take 1 tablet (80 mg total) by mouth daily.   buPROPion 300 MG 24 hr tablet Commonly known as: WELLBUTRIN XL Take 1 tablet (300 mg total) by mouth daily.   clopidogrel 75 MG tablet Commonly known as: PLAVIX Take 1 tablet (75 mg total) by mouth daily.   diclofenac sodium 1 % Gel Commonly known as: VOLTAREN Apply 4 g topically 4 (four) times daily. To affected joint.   iron polysaccharides 150 MG capsule Commonly known as: NIFEREX Take 1 capsule (150 mg total) by mouth daily.   metFORMIN 500 MG tablet Commonly known as: GLUCOPHAGE Take 1 tablet (500 mg total) by mouth daily with breakfast. diabetes   metoprolol tartrate 50 MG tablet Commonly known as: LOPRESSOR Take 1 tablet (50 mg total) by mouth daily.   mirtazapine 30 MG disintegrating tablet Commonly known as: REMERON SOL-TAB Take 1 tablet (30 mg total) by mouth at bedtime. On tongue   multivitamin tablet Take 1 tablet by mouth daily.   nitroGLYCERIN 0.4 MG SL tablet Commonly known as: NITROSTAT Place 1 tablet (0.4 mg total) under the tongue every 5 (five) minutes as needed for chest pain (3 doses max).   pantoprazole 40 MG tablet Commonly known as: PROTONIX Take 1 tablet (40 mg total) by mouth 2 (two) times daily.   predniSONE 20 MG tablet Commonly known as: DELTASONE 60 mg x2d, 40 mg x3d, 20 mg x2d, 10 mg x2d Started by: Howard Pouch, DO   Symbicort 160-4.5 MCG/ACT inhaler Generic drug: budesonide-formoterol Inhale 2 puffs into the lungs 2 (two) times daily.   tamsulosin 0.4 MG Caps capsule Commonly known as: FLOMAX TAKE 1  CAPSULE BY MOUTH ONCE AT NIGHT AT BEDTIME   traZODone 50 MG tablet Commonly known as: DESYREL Take 3 tablets (150 mg total) by mouth at bedtime.   triamcinolone cream 0.1 % Commonly known as: KENALOG Apply 1 application topically 2 (two) times daily. Started by: Howard Pouch, DO  VITAMIN B 12 PO Take by mouth.       All past medical history, surgical history, allergies, family history, immunizations andmedications were updated in the EMR today and reviewed under the history and medication portions of their EMR.     ROS: Negative, with the exception of above mentioned in HPI   Objective:  BP 138/72 (BP Location: Right Arm, Patient Position: Sitting, Cuff Size: Normal)   Pulse 61   Temp 98 F (36.7 C) (Temporal)   Resp 17   Ht 5\' 10"  (1.778 m)   Wt 211 lb 8 oz (95.9 kg)   SpO2 95%   BMI 30.35 kg/m  Body mass index is 30.35 kg/m. Gen: Afebrile. No acute distress. Nontoxic in appearance, well developed, well nourished.  HENT: AT. [place].  No facial or oral lesions present Eyes:Pupils Equal Round Reactive to light, Extraocular movements intact,  Conjunctiva without redness, discharge or icterus. Chest: Good air movement, normal resp effort.  Skin: Swelling and erythema bilateral forearms, tricep area and abdomen.  Occasional vesicles remain. Neuro: Normal gait. PERLA. EOMi. Alert. Oriented x3 . Psych: Normal affect, dress and demeanor. Normal speech. Normal thought content and judgment.  No exam data present No results found. No results found for this or any previous visit (from the past 24 hour(s)).  Assessment/Plan: [name] is a 74 y.o. male present for OV for  Plant dermatitis -Does appear consistent with plant dermatitis from sumac.  IM Depo-Medrol injection provided today. -Steroid taper with instructions to start tomorrow -Kenalog cream twice daily as needed -Patient was encouraged to purchase over-the-counter Benadryl gel to help control itching and  burning. - methylPREDNISolone acetate (DEPO-MEDROL) injection 80 mg Follow-up as needed, sooner if worsening.   Reviewed expectations re: course of current medical issues.  Discussed self-management of symptoms.  Outlined signs and symptoms indicating need for more acute intervention.  Patient verbalized understanding and all questions were answered.  Patient received an After-Visit Summary.    No orders of the defined types were placed in this encounter.  Meds ordered this encounter  Medications  . triamcinolone cream (KENALOG) 0.1 %    Sig: Apply 1 application topically 2 (two) times daily.    Dispense:  30 g    Refill:  0  . predniSONE (DELTASONE) 20 MG tablet    Sig: 60 mg x2d, 40 mg x3d, 20 mg x2d, 10 mg x2d    Dispense:  15 tablet    Refill:  0  . methylPREDNISolone acetate (DEPO-MEDROL) injection 80 mg   Referral Orders  No referral(s) requested today     Note is dictated utilizing voice recognition software. Although note has been proof read prior to signing, occasional typographical errors still can be missed. If any questions arise, please do not hesitate to call for verification.   electronically signed by:  Howard Pouch, DO  [place]

## 2020-03-08 NOTE — Telephone Encounter (Signed)
Pt was called and told he would get refills after he had appt today with Dr Raoul Pitch at 3:15, he verbalized understanding

## 2020-03-08 NOTE — Telephone Encounter (Signed)
mirtazapine (REMERON SOL-TAB) 30 MG disintegrating tablet   pantoprazole (PROTONIX) 40 MG tablet  metoprolol tartrate (LOPRESSOR) 50 MG tablet  metFORMIN (GLUCOPHAGE) 500 MG tablet   SYMBICORT 160-4.5 MCG/ACT inhaler  Needing refill on these medications !!

## 2020-03-08 NOTE — Patient Instructions (Signed)
Steroid shot given today.  Start the steroid pill tomorrow > follow instructions on tapering.  Steroid cream prescribed> you can use 2-3 times a days. Buy over the counter Benadryl gel to help with burning/itching.    Poison [place]/Ivy/[place] Dermatitis Poison ivy dermatitis is redness and soreness of the skin caused by chemicals in the leaves of the poison ivy plant. You may have very bad itching, swelling, a rash, and blisters. What are the causes?  Touching a poison ivy plant.  Touching something that has the chemical on it. This may include animals or objects that have come in contact with the plant. What increases the risk?  Going outdoors often in wooded or [place] areas.  Going outdoors without wearing protective clothing, such as closed shoes, long pants, and a long-sleeved shirt. What are the signs or symptoms?   Skin redness.  Very bad itching.  A rash that often includes bumps and blisters. ? The rash usually appears 48 hours after exposure, if you have been exposed before. ? If this is the first time you have been exposed, the rash may not appear until a week after exposure.  Swelling. This may occur if the reaction is very bad. Symptoms usually last for 1-2 weeks. The first time you develop this condition, symptoms may last 3-4 weeks. How is this treated? This condition may be treated with:  Hydrocortisone cream or calamine lotion to relieve itching.  Oatmeal baths to soothe the skin.  Medicines, such as over-the-counter antihistamine tablets.  Oral steroid medicine for more severe reactions. Follow these instructions at home: Medicines  Take or apply over-the-counter and prescription medicines only as told by your doctor.  Use hydrocortisone cream or calamine lotion as needed to help with itching. General instructions  Do not scratch or rub your skin.  Put a cold, wet cloth (cold compress) on the affected areas or take baths in cool water. This will help  with itching.  Avoid hot baths and showers.  Take oatmeal baths as needed. Use colloidal oatmeal. You can get this at a pharmacy or grocery store. Follow the instructions on the package.  While you have the rash, wash your clothes right after you wear them.  Keep all follow-up visits as told by your health care provider. This is important. How is this prevented?   Know what poison ivy looks like, so you can avoid it. ? This plant has three leaves with flowering branches on a single stem. ? The leaves are glossy. ? The leaves have uneven edges that come to a point at the front.  If you touch poison ivy, wash your skin with soap and water right away. Be sure to wash under your fingernails.  When hiking or camping, wear long pants, a long-sleeved shirt, tall socks, and hiking boots. You can also use a lotion on your skin that helps to prevent contact with poison ivy.  If you think that your clothes or outdoor gear came in contact with poison ivy, rinse them off with a garden hose before you bring them inside your house.  When doing yard work or gardening, wear gloves, long sleeves, long pants, and boots. Wash your garden tools and gloves if they come in contact with poison ivy.  If you think that your pet has come into contact with poison ivy, wash him or her with pet shampoo and water. Make sure to wear gloves while washing your pet. Contact a doctor if:  You have open sores in the rash  area.  You have more redness, swelling, or pain in the rash area.  You have redness that spreads beyond the rash area.  You have fluid, blood, or pus coming from the rash area.  You have a fever.  You have a rash over a large area of your body.  You have a rash on your eyes, mouth, or genitals.  Your rash does not get better after a few weeks. Get help right away if:  Your face swells or your eyes swell shut.  You have trouble breathing.  You have trouble swallowing. These symptoms may  be an emergency. Do not wait to see if the symptoms will go away. Get medical help right away. Call your local emergency services (911 in the [place] Do not drive yourself to the hospital. Summary  Poison ivy dermatitis is redness and soreness of the skin caused by chemicals in the leaves of the poison ivy plant.  You may have skin redness, very bad itching, swelling, and a rash.  Do not scratch or rub your skin.  Take or apply over-the-counter and prescription medicines only as told by your doctor. This information is not intended to replace advice given to you by your health care provider. Make sure you discuss any questions you have with your health care provider. Document Revised: 03/05/2019 Document Reviewed: 11/06/2018 Elsevier Patient Education  2020 [employer].

## 2020-03-10 ENCOUNTER — Other Ambulatory Visit: Payer: Self-pay

## 2020-03-10 ENCOUNTER — Ambulatory Visit (INDEPENDENT_AMBULATORY_CARE_PROVIDER_SITE_OTHER): Payer: Medicare HMO | Admitting: Family Medicine

## 2020-03-10 ENCOUNTER — Encounter: Payer: Self-pay | Admitting: Family Medicine

## 2020-03-10 VITALS — BP 160/90 | HR 65 | Temp 98.2°F | Ht 71.0 in | Wt 211.4 lb

## 2020-03-10 DIAGNOSIS — Z79899 Other long term (current) drug therapy: Secondary | ICD-10-CM

## 2020-03-10 DIAGNOSIS — I2510 Atherosclerotic heart disease of native coronary artery without angina pectoris: Secondary | ICD-10-CM

## 2020-03-10 DIAGNOSIS — Z5181 Encounter for therapeutic drug level monitoring: Secondary | ICD-10-CM | POA: Diagnosis not present

## 2020-03-10 DIAGNOSIS — I252 Old myocardial infarction: Secondary | ICD-10-CM

## 2020-03-10 DIAGNOSIS — I10 Essential (primary) hypertension: Secondary | ICD-10-CM

## 2020-03-10 DIAGNOSIS — R413 Other amnesia: Secondary | ICD-10-CM | POA: Diagnosis not present

## 2020-03-10 DIAGNOSIS — F3341 Major depressive disorder, recurrent, in partial remission: Secondary | ICD-10-CM

## 2020-03-10 DIAGNOSIS — K219 Gastro-esophageal reflux disease without esophagitis: Secondary | ICD-10-CM

## 2020-03-10 DIAGNOSIS — E119 Type 2 diabetes mellitus without complications: Secondary | ICD-10-CM | POA: Diagnosis not present

## 2020-03-10 DIAGNOSIS — J449 Chronic obstructive pulmonary disease, unspecified: Secondary | ICD-10-CM | POA: Diagnosis not present

## 2020-03-10 DIAGNOSIS — E611 Iron deficiency: Secondary | ICD-10-CM

## 2020-03-10 DIAGNOSIS — F325 Major depressive disorder, single episode, in full remission: Secondary | ICD-10-CM | POA: Diagnosis not present

## 2020-03-10 DIAGNOSIS — J42 Unspecified chronic bronchitis: Secondary | ICD-10-CM | POA: Diagnosis not present

## 2020-03-10 LAB — POCT GLYCOSYLATED HEMOGLOBIN (HGB A1C)
HbA1c POC (<> result, manual entry): 7.1 % (ref 4.0–5.6)
HbA1c, POC (controlled diabetic range): 7.1 % — AB (ref 0.0–7.0)
HbA1c, POC (prediabetic range): 7.1 % — AB (ref 5.7–6.4)
Hemoglobin A1C: 7.1 % — AB (ref 4.0–5.6)

## 2020-03-10 LAB — TSH: TSH: 0.54 u[IU]/mL (ref 0.35–4.50)

## 2020-03-10 LAB — B12 AND FOLATE PANEL
Folate: 14 ng/mL (ref >5.9)
Vitamin B-12: 664 pg/mL (ref 211–911)

## 2020-03-10 LAB — MAGNESIUM: Magnesium: 1.6 mg/dL (ref 1.5–2.5)

## 2020-03-10 LAB — T4, FREE: Free T4: 0.7 ng/dL (ref 0.60–1.60)

## 2020-03-10 LAB — MICROALBUMIN / CREATININE URINE RATIO
Creatinine,U: 33.9 mg/dL
Microalb Creat Ratio: 2.1 mg/g (ref 0.0–30.0)
Microalb, Ur: <0.7 mg/dL (ref 0.0–1.9)

## 2020-03-10 LAB — VITAMIN D 25 HYDROXY (VIT D DEFICIENCY, FRACTURES): VITD: 40.64 ng/mL (ref 30.00–100.00)

## 2020-03-10 MED ORDER — METFORMIN HCL 1000 MG PO TABS: 1000.0000 mg | ORAL_TABLET | Freq: Every day | ORAL | 1 refills | Status: DC

## 2020-03-10 MED ORDER — PANTOPRAZOLE SODIUM 40 MG PO TBEC: 40.0000 mg | DELAYED_RELEASE_TABLET | Freq: Two times a day (BID) | ORAL | 3 refills | Status: DC

## 2020-03-10 MED ORDER — METOPROLOL TARTRATE 50 MG PO TABS: 50.0000 mg | ORAL_TABLET | Freq: Every day | ORAL | 1 refills | Status: DC

## 2020-03-10 MED ORDER — MIRTAZAPINE 30 MG PO TBDP: 30.0000 mg | ORAL_TABLET | Freq: Every day | ORAL | 1 refills | Status: DC

## 2020-03-10 MED ORDER — FAMOTIDINE 20 MG PO TABS: 20.0000 mg | ORAL_TABLET | Freq: Two times a day (BID) | ORAL | 3 refills | Status: DC

## 2020-03-10 MED ORDER — TRAZODONE HCL 50 MG PO TABS: 150.0000 mg | ORAL_TABLET | Freq: Every day | ORAL | 1 refills | Status: DC

## 2020-03-10 MED ORDER — BUPROPION HCL ER (XL) 300 MG PO TB24: 300.0000 mg | ORAL_TABLET | Freq: Every day | ORAL | 1 refills | Status: DC

## 2020-03-10 NOTE — Patient Instructions (Addendum)
Medications the same except increase metformin to 1000 mg a day (new tab will be 1000 mg ea) and added pepcid (twice a day) for better reflux/stomach control.   I will call you with your lab results and discuss your memory loss in more detail after labs.   Follow up on chronic conditions in 4 months> we will collect all your labs that day.

## 2020-03-10 NOTE — Progress Notes (Signed)
[name] , [birth year], 74 y.o., male MRN: [id number] Patient Care Team    Relationship Specialty Notifications Start End  [name], DO PCP - General Family Medicine  12/25/15   Josue Hector, MD PCP - Cardiology Cardiology Admissions 08/20/18   Brunetta Genera, MD Consulting Physician Hematology  09/03/16   Josue Hector, MD Consulting Physician Cardiology  09/03/16   Wilford Corner, MD Consulting Physician Gastroenterology  09/03/16   Melina Schools, OD  Optometry  09/03/16   Kathie Rhodes, MD Consulting Physician Urology  09/10/16   Hollar, Katharine Look, MD Referring Physician Dermatology  06/29/19     Chief Complaint  Patient presents with  . Diabetes    Needs refills.   . Depression    Subjective: [name] is a 74 y.o. male present for [facility] Recurrent major depressive disorder, in partial remission (HCC)/memory changes Patient feels that his depression is doing well.  Current medication regimen of trazodone, Remeron and Wellbutrin. He denies negative side effects occasions.  He and his wife both have noticed he is having some memory changes which have him concerned.  He reports both his mother and his sisters had dementia.  He feels he is becoming more forgetful.  His wife has told him he will talk about the same things that they had already talked about prior.  Gastroesophageal reflux disease, esophagitis presence not specified  EGD 03/2016 by Dr. Michail Sermon with signs of barrett's esophagus. Patient was tried on lower dose medication, and was unable to tolerate secondary to return of symptoms.  His symptoms are controlled protonix 40 BID.  Patient doing well with his reflux on medications.  COPD ([place]) He uses Symbicort daily, Spiriva needed during certain times of the year only. His pneumonia series is completed.  Symptoms are well controlled on Symbicort only.  Atherosclerosis of native coronary artery of native heart without angina pectoris/Essential  hypertension/HYPERCHOLESTEROLEMIA/History of MI (myocardial infarction)/IDA Pt reports compliance with metoprolol 50 mg daily. Blood pressures ranges at home are not routinely checked. Patient denies chest pain, shortness of breath, dizziness or lower extremity edema.   Pt takes a daily baby ASA and Plavix. Pt is  prescribed statin and followed by Dr. Johnsie Cancel Cardiology. Labs up-to-date 08/18/2019 Diet: Low-sodium RF: Hypertension, hyperlipidemia, diabetes  Type 2 diabetes mellitus without complication, without long-term current use of insulin ([place]) patient reports compliance with metformin 500 mg QD  Pt reports BG ranges not routinely checked. - Foot exam: 03/10/2020 - eye exam: 75/03/2019 - PNA: Series completed - Flu: UTD 2020 , encouraged yearly - Urine Microalbumin w/creat. Ratio: completed 03/10/2020 Last A1c 6.5> 7.1 today   Depression screen [facility] 2/9 03/10/2020 08/18/2019 06/29/2019 02/12/2019 08/07/2018  Decreased Interest 0 3 0 2 0  Down, Depressed, Hopeless 0 1 0 2 0  PHQ - 2 Score 0 4 0 4 0  Altered sleeping 0 0 0 1 1  Tired, decreased energy 3 1 0 2 0  Change in appetite 0 1 0 3 0  Feeling bad or failure about yourself  0 0 0 1 0  Trouble concentrating 1 0 0 1 1  Moving slowly or fidgety/restless 0 0 0 0 0  Suicidal thoughts 0 0 0 0 0  PHQ-9 Score 4 6 0 12 2  Difficult doing work/chores Not difficult at all - Not difficult at all Somewhat difficult Not difficult at all  Some recent data might be hidden   GAD 7 : Generalized Anxiety  Score 02/12/2019 12/25/2015  Nervous, Anxious, on Edge 0 2  Control/stop worrying 3 2  Worry too much - different things 3 3  Trouble relaxing 3 2  Restless 3 2  Easily annoyed or irritable 3 2  Afraid - awful might happen 0 0  Total GAD 7 Score 15 13  Anxiety Difficulty Somewhat difficult Somewhat difficult    Allergies  Allergen Reactions  . Altace [Ramipril] Cough  . Codeine     Makes sick  . Tape     Blisters   . Zantac [Ranitidine  Hcl] Dermatitis   Social History   Tobacco Use  . Smoking status: Former Smoker    Types: Cigarettes    Quit date: 11/25/1998    Years since quitting: 21.3  . Smokeless tobacco: Never Used  Substance Use Topics  . Alcohol use: No   Past Medical History:  Diagnosis Date  . Achilles tendinitis of left lower extremity 07/20/2018  . Adenomatous colon polyp 2011  . Barrett esophagus 12/2013   EGD  . CAD (coronary artery disease)    ETT/Lexiscan-Myoview (11/14):  Normal; no ischemia, EF 67%  . CKD (chronic kidney disease), stage III   . Colon polyp 09/2010   adenoma  . COPD (chronic obstructive pulmonary disease) ([place]) 2009   spirometry with "moderate COPD"  . Depression   . Diabetes mellitus without complication ([place])   . Dyspnea   . GERD (gastroesophageal reflux disease)   . GI bleed    after polypectomy/admitted  . Hair loss 08/07/2018  . HTN (hypertension)   . Hypercholesterolemia   . Myocardial infarction ([place])   . Obesity   . Sleep apnea   . Spontaneous pneumothorax   . Synovial cyst of right popliteal space 07/20/2018  . Vertigo    Past Surgical History:  Procedure Laterality Date  . APPENDECTOMY    . PLEURAL SCARIFICATION  1987  . TONSILLECTOMY    . VARICOCELECTOMY     Family History  Problem Relation Age of Onset  . Heart disease Mother   . CVA Mother   . Dementia Mother   . Coronary artery disease Father 68  . Heart disease Father   . Stroke Father   . Dementia Sister   . Cancer - Other Brother   . Hypertension Other   . Hyperlipidemia Other   . Colon polyps Sister   . Cancer Sister    Allergies as of 03/10/2020      Reactions   Altace [ramipril] Cough   Codeine    Makes sick   Tape    Blisters   Zantac [ranitidine Hcl] Dermatitis      Medication List       Accurate as of March 10, 2020 11:59 PM. If you have any questions, ask your nurse or doctor.        STOP taking these medications   predniSONE 20 MG tablet Commonly known as:  DELTASONE Stopped by: Howard Pouch, DO   triamcinolone cream 0.1 % Commonly known as: KENALOG Stopped by: Howard Pouch, DO     TAKE these medications   aspirin EC 81 MG tablet Take 81 mg by mouth daily.   atorvastatin 80 MG tablet Commonly known as: LIPITOR Take 1 tablet (80 mg total) by mouth daily.   buPROPion 300 MG 24 hr tablet Commonly known as: WELLBUTRIN XL Take 1 tablet (300 mg total) by mouth daily.   clopidogrel 75 MG tablet Commonly known as: PLAVIX Take 1 tablet (75 mg total)  by mouth daily.   diclofenac sodium 1 % Gel Commonly known as: VOLTAREN Apply 4 g topically 4 (four) times daily. To affected joint.   famotidine 20 MG tablet Commonly known as: Pepcid Take 1 tablet (20 mg total) by mouth 2 (two) times daily. Started by: Howard Pouch, DO   iron polysaccharides 150 MG capsule Commonly known as: NIFEREX Take 1 capsule (150 mg total) by mouth daily.   metFORMIN 1000 MG tablet Commonly known as: GLUCOPHAGE Take 1 tablet (1,000 mg total) by mouth daily with breakfast. diabetes What changed:   medication strength  how much to take Changed by: Howard Pouch, DO   metoprolol tartrate 50 MG tablet Commonly known as: LOPRESSOR Take 1 tablet (50 mg total) by mouth daily.   mirtazapine 30 MG disintegrating tablet Commonly known as: REMERON SOL-TAB Take 1 tablet (30 mg total) by mouth at bedtime. On tongue   multivitamin tablet Take 1 tablet by mouth daily.   nitroGLYCERIN 0.4 MG SL tablet Commonly known as: NITROSTAT Place 1 tablet (0.4 mg total) under the tongue every 5 (five) minutes as needed for chest pain (3 doses max).   pantoprazole 40 MG tablet Commonly known as: PROTONIX Take 1 tablet (40 mg total) by mouth 2 (two) times daily.   Symbicort 160-4.5 MCG/ACT inhaler Generic drug: budesonide-formoterol Inhale 2 puffs into the lungs 2 (two) times daily.   tamsulosin 0.4 MG Caps capsule Commonly known as: FLOMAX TAKE 1 CAPSULE BY MOUTH  ONCE AT NIGHT AT BEDTIME   traZODone 50 MG tablet Commonly known as: DESYREL Take 3 tablets (150 mg total) by mouth at bedtime.   VITAMIN B 12 PO Take by mouth.       No results found for this or any previous visit (from the past 24 hour(s)). No results found.   ROS: Negative, with the exception of above mentioned in HPI   Objective:  BP (!) 160/90 (BP Location: Right Arm, Patient Position: Sitting, Cuff Size: Normal)   Pulse 65   Temp 98.2 F (36.8 C) (Temporal)   Ht 5\' 11"  (1.803 m)   Wt 211 lb 6 oz (95.9 kg)   SpO2 95%   BMI 29.48 kg/m  Body mass index is 29.48 kg/m. Gen: Afebrile. No acute distress.  Nontoxic in appearance.  Pleasant male. HENT: AT.  AFB.  Eyes:Pupils Equal Round Reactive to light, Extraocular movements intact,  Conjunctiva without redness, discharge or icterus. Neck/lymp/endocrine: Supple, no lymphadenopathy, no thyromegaly CV: RRR no murmur, no edema, +2/4 P posterior tibialis pulses Chest: CTAB, no wheeze or crackles Abd: Soft. NTND. BS present.  Neuro:  Normal gait. PERLA. EOMi. Alert. Oriented x3  Psych: Normal affect, dress and demeanor. Normal speech. Normal thought content and judgment.    No results found for this or any previous visit (from the past 48 hour(s)).  Assessment/Plan: [name] is a 74 y.o. male present for OV for  Recurrent major depressive disorder, in partial remission ([place]) -Stable. -Continue Remeron 30 mg QHS.  -Continue trazodone 2-3 tabs QHS PRN -Continue wellbutrin 300 QD.  - Follow-up 5 months  Gastroesophageal reflux disease, esophagitis presence not specified -Mild increase in symptoms reported -Continue Protonix 40 mg twice a day, was unable to take back secondary to recurrence of symptoms. Refills provided today -Added Pepcid twice daily today - B 12, vitamin D and magnesium levels collected today.   Chronic bronchitis, unspecified chronic bronchitis type ([place]) -Stable.  -  Continue Symbicort. Can  hold on Spiriva if feeling  okay on current regimen. May need to add the Spiriva during colder months if that is a trigger for him  Atherosclerosis of native coronary artery of native heart without angina pectoris/Essential hypertension/HYPERCHOLESTEROLEMIA/History of MI (myocardial infarction) Stage 3 chronic kidney disease/ iron deficiency anemia - following with cards, Dr. Johnsie Cancel. - Pt has not taken his BP meds today. -Continue statin -Continue metoprolol - ASA and plavix managed by cardio - low sodium diet and exercise encouraged.   Type 2 diabetes mellitus without complication, without long-term current use of insulin (HCC) - A1c today 5.8--> 6.0-->6.1>>6.7> 6.5> 7.1 today.  -Increased metformin to 1000 QD  - Foot exam: 03/10/2020 - eye exam: 03/30/2019 - PNA: Series completed - Flu: UTD 2020, encouraged yearly - Urine Microalbumin w/creat. Ratio: completed 03/10/2020> collected today F/u 5 mos Iron deficiency anemia: Iron panel collected today Currently on supplementation  Memory deficits: Patient noting memory deficit.  Family members are also noticing a deficit.  Family history of of unknown type in his mother and sister. B12,, vitamin D, TSH, t4 collected today -Patient had elevated PSA with prostate biopsy October 2019 with urology.  Plan was at that time to repeat PSA in 3 and 12 months by urology, do not see evidence in the EMR this has been completed.  We will need to check with patient and if not completed we will need to initiate testing or follow-up. -Patient will be called with laboratory results if no correctable causes identified will initiate treatment plan.  If no identifiable cause, will consider neurology/neuropsych referral and consider MRI.   Reviewed expectations re: course of current medical issues.  Discussed self-management of symptoms.  Outlined signs and symptoms indicating need for more acute intervention.  Patient verbalized understanding and all  questions were answered.  Patient received an After-Visit Summary.   Greater than 45 minutes spent with patient treating multiple chronic conditions, addressing new memory deficits and reviewing laboratory results and office visit notes by other providers.  Orders Placed This Encounter  Procedures  . Urine Microalbumin w/creat. ratio  . Magnesium  . B12 and Folate Panel  . Vitamin D (25 hydroxy)  . TSH  . T4, free  . Iron, TIBC and Ferritin Panel  . POCT glycosylated hemoglobin (Hb A1C)   Meds ordered this encounter  Medications  . metFORMIN (GLUCOPHAGE) 1000 MG tablet    Sig: Take 1 tablet (1,000 mg total) by mouth daily with breakfast. diabetes    Dispense:  90 tablet    Refill:  1  . metoprolol tartrate (LOPRESSOR) 50 MG tablet    Sig: Take 1 tablet (50 mg total) by mouth daily.    Dispense:  90 tablet    Refill:  1  . mirtazapine (REMERON SOL-TAB) 30 MG disintegrating tablet    Sig: Take 1 tablet (30 mg total) by mouth at bedtime. On tongue    Dispense:  90 tablet    Refill:  1  . pantoprazole (PROTONIX) 40 MG tablet    Sig: Take 1 tablet (40 mg total) by mouth 2 (two) times daily.    Dispense:  180 tablet    Refill:  3  . traZODone (DESYREL) 50 MG tablet    Sig: Take 3 tablets (150 mg total) by mouth at bedtime.    Dispense:  270 tablet    Refill:  1  . buPROPion (WELLBUTRIN XL) 300 MG 24 hr tablet    Sig: Take 1 tablet (300 mg total) by mouth daily.    Dispense:  90 tablet    Refill:  1  . famotidine (PEPCID) 20 MG tablet    Sig: Take 1 tablet (20 mg total) by mouth 2 (two) times daily.    Dispense:  180 tablet    Refill:  3     electronically signed by:  Howard Pouch, DO  [place]

## 2020-03-11 LAB — IRON,TIBC AND FERRITIN PANEL
%SAT: 23 % (calc) (ref 20–48)
Ferritin: 81 ng/mL (ref 24–380)
Iron: 66 ug/dL (ref 50–180)
TIBC: 281 mcg/dL (calc) (ref 250–425)

## 2020-03-14 ENCOUNTER — Telehealth: Payer: Self-pay | Admitting: Family Medicine

## 2020-03-14 ENCOUNTER — Encounter: Payer: Self-pay | Admitting: Family Medicine

## 2020-03-14 DIAGNOSIS — R350 Frequency of micturition: Secondary | ICD-10-CM | POA: Diagnosis not present

## 2020-03-14 NOTE — Telephone Encounter (Signed)
Please inform patient the following information: - His magnesium levels are at the very low end of normal at 1.6.  I would encourage him to start a daily magnesium supplementation of 250 mg a day.  If he notices loose stools after starting supplementation would cut back to every other day. -Thyroid studies are normal. -B12, vitamin D and folate levels are normal -Iron levels are normal -Urine studies are normal Please ask patient if he has seen his urologist to have his PSA tested within the last 12 months.  Last visit sent to [place] was from October 2019 shortly after he had his biopsy.  If he has not seen his urologist since then he needs to make an appointment to see them as soon as possible for follow-up.  He was to have repeat PSAs at 3 months and 12 months after his biopsies and I do not see this has occurred.   Please have him follow-up in 2 - 4 weeks to discuss his memory deficits in more detail, allow [place] to perform more of a mental status exam and consider next step of evaluation including possible MRI of the brain since there was no obvious laboratory result that would account for his memory deficits.  We also need to recheck his blood pressure since it was mildly elevated at his last appointment.  Please remind him to take his blood pressure medication at least 2 hours before his follow-up.

## 2020-03-15 NOTE — Telephone Encounter (Signed)
Spoke with patient. Pt verbalized understanding. Pt states he can not recall the last time he had labs done with urology. Pt states he has been there recently and he will have the information sent over. Pt made a follow up appointment and was given the reminder to take medication 2 hours before appointment.

## 2020-03-20 DIAGNOSIS — L02215 Cutaneous abscess of perineum: Secondary | ICD-10-CM | POA: Diagnosis not present

## 2020-03-21 DIAGNOSIS — L02215 Cutaneous abscess of perineum: Secondary | ICD-10-CM | POA: Diagnosis not present

## 2020-03-22 DIAGNOSIS — L02215 Cutaneous abscess of perineum: Secondary | ICD-10-CM | POA: Diagnosis not present

## 2020-03-22 DIAGNOSIS — Z4800 Encounter for change or removal of nonsurgical wound dressing: Secondary | ICD-10-CM | POA: Diagnosis not present

## 2020-03-23 DIAGNOSIS — Z4800 Encounter for change or removal of nonsurgical wound dressing: Secondary | ICD-10-CM | POA: Diagnosis not present

## 2020-03-24 DIAGNOSIS — Z4800 Encounter for change or removal of nonsurgical wound dressing: Secondary | ICD-10-CM | POA: Diagnosis not present

## 2020-03-27 DIAGNOSIS — Z4800 Encounter for change or removal of nonsurgical wound dressing: Secondary | ICD-10-CM | POA: Diagnosis not present

## 2020-03-28 DIAGNOSIS — R350 Frequency of micturition: Secondary | ICD-10-CM | POA: Diagnosis not present

## 2020-03-29 DIAGNOSIS — Z4800 Encounter for change or removal of nonsurgical wound dressing: Secondary | ICD-10-CM | POA: Diagnosis not present

## 2020-03-31 DIAGNOSIS — L02215 Cutaneous abscess of perineum: Secondary | ICD-10-CM | POA: Diagnosis not present

## 2020-04-04 DIAGNOSIS — R350 Frequency of micturition: Secondary | ICD-10-CM | POA: Diagnosis not present

## 2020-04-04 DIAGNOSIS — R3915 Urgency of urination: Secondary | ICD-10-CM | POA: Diagnosis not present

## 2020-04-12 ENCOUNTER — Encounter: Payer: Self-pay | Admitting: Family Medicine

## 2020-04-12 ENCOUNTER — Other Ambulatory Visit: Payer: Self-pay

## 2020-04-12 ENCOUNTER — Ambulatory Visit (INDEPENDENT_AMBULATORY_CARE_PROVIDER_SITE_OTHER): Payer: Medicare HMO | Admitting: Family Medicine

## 2020-04-12 VITALS — BP 135/78 | HR 67 | Temp 97.8°F | Resp 16 | Ht 70.0 in | Wt 205.5 lb

## 2020-04-12 DIAGNOSIS — G309 Alzheimer's disease, unspecified: Secondary | ICD-10-CM | POA: Diagnosis not present

## 2020-04-12 DIAGNOSIS — I10 Essential (primary) hypertension: Secondary | ICD-10-CM

## 2020-04-12 DIAGNOSIS — R4189 Other symptoms and signs involving cognitive functions and awareness: Secondary | ICD-10-CM | POA: Diagnosis not present

## 2020-04-12 DIAGNOSIS — Z818 Family history of other mental and behavioral disorders: Secondary | ICD-10-CM | POA: Diagnosis not present

## 2020-04-12 LAB — BASIC METABOLIC PANEL
BUN: 21 mg/dL (ref 6–23)
CO2: 26 mEq/L (ref 19–32)
Calcium: 9.7 mg/dL (ref 8.4–10.5)
Chloride: 102 mEq/L (ref 96–112)
Creatinine, Ser: 1.26 mg/dL (ref 0.40–1.50)
GFR: 56.18 mL/min — ABNORMAL LOW (ref >60.00)
Glucose, Bld: 160 mg/dL — ABNORMAL HIGH (ref 70–99)
Potassium: 4.8 mEq/L (ref 3.5–5.1)
Sodium: 138 mEq/L (ref 135–145)

## 2020-04-12 NOTE — Progress Notes (Signed)
[name] , [birth year], 74 y.o., male MRN: [id number] Patient Care Team    Relationship Specialty Notifications Start End  [name], DO PCP - General Family Medicine  12/25/15   Josue Hector, MD PCP - Cardiology Cardiology Admissions 08/20/18   Brunetta Genera, MD Consulting Physician Hematology  09/03/16   Josue Hector, MD Consulting Physician Cardiology  09/03/16   Wilford Corner, MD Consulting Physician Gastroenterology  09/03/16   Melina Schools, OD  Optometry  09/03/16   Kathie Rhodes, MD Consulting Physician Urology  09/10/16   Hollar, Katharine Look, MD Referring Physician Dermatology  06/29/19     Chief Complaint  Patient presents with  . Hypertension  . Memory Loss    Subjective: [name] is a 74 y.o. male present to discuss the memory changes he is experiencing.  Present to his chronic medical condition appointment 4 weeks ago and with concerns of memory changes at the end of his visit.  We briefly discussed his memory changes at that time and added labs to further evaluate memory and then brought him back today to discuss in more detail.   Patient's iron panel, vitamin D and B12 levels were normal as well as his thyroid function 4 weeks ago. Patient has a history of depression/anxiety, hypertension, coronary artery disease, reflux, diabetes, prior history of MI, iron deficiency and elevated PSA. He is on multiple medications for depression anxiety which do have a sedating side effect.  He denies sedation and states he still wakes up in the middle the night and sometimes has difficulty falling back asleep.  He does endorse feeling like he has had increased anxiety over the last few months, despite treatment.  He is on high-dose atorvastatin 80 mg and Plavix. He reports there is a family history of dementia in his mother and sister.  He is uncertain what type of dementia either of them had.  He reports he noticed his mother's dementia more prominent  after her stroke when she was in her 60s and his sister dementia also occurred later in life. [name] reports he had noticed his memory decline "a couple years ago.  " Over the last year his wife has also brought his memory declined to his attention.  He reports his wife first noticed he was having trouble finding the words he was trying to say.  He states he also noticed he would have difficulty thinking of acquaintances names, or people on the television, despite knowing who they were.  He denies having any difficulty with driving or getting lost.  He denies any difficulty with IADLs/ADLs.  He does forget conversations.  His wife has told him that he will repeat conversations or questions that they already had.  He actually forgot that he brought up his memory decline last appointment with this provider or that this appointment was made for his memory decline.  Cognitive assessment: Word recall: 2/3 Clock draw: Patient drew around clock.  Numbers were in the correct order with mildly abnormal spacing (9 is in the 10 location, 10 is an 11 location, 11 in the new location and 12 was not present).  Depression screen [facility] 2/9 03/10/2020 08/18/2019 06/29/2019 02/12/2019 08/07/2018  Decreased Interest 0 3 0 2 0  Down, Depressed, Hopeless 0 1 0 2 0  PHQ - 2 Score 0 4 0 4 0  Altered sleeping 0 0 0 1 1  Tired, decreased energy 3 1 0 2 0  Change  in appetite 0 1 0 3 0  Feeling bad or failure about yourself  0 0 0 1 0  Trouble concentrating 1 0 0 1 1  Moving slowly or fidgety/restless 0 0 0 0 0  Suicidal thoughts 0 0 0 0 0  PHQ-9 Score 4 6 0 12 2  Difficult doing work/chores Not difficult at all - Not difficult at all Somewhat difficult Not difficult at all  Some recent data might be hidden   GAD 7 : Generalized Anxiety Score 02/12/2019 12/25/2015  Nervous, Anxious, on Edge 0 2  Control/stop worrying 3 2  Worry too much - different things 3 3  Trouble relaxing 3 2  Restless 3 2  Easily annoyed or irritable  3 2  Afraid - awful might happen 0 0  Total GAD 7 Score 15 13  Anxiety Difficulty Somewhat difficult Somewhat difficult    Allergies  Allergen Reactions  . Altace [Ramipril] Cough  . Codeine     Makes sick  . Tape     Blisters   . Zantac [Ranitidine Hcl] Dermatitis   Social History   Tobacco Use  . Smoking status: Former Smoker    Types: Cigarettes    Quit date: 11/25/1998    Years since quitting: 21.3  . Smokeless tobacco: Never Used  Substance Use Topics  . Alcohol use: No   Past Medical History:  Diagnosis Date  . Achilles tendinitis of left lower extremity 07/20/2018  . Adenomatous colon polyp 2011  . Barrett esophagus 12/2013   EGD  . CAD (coronary artery disease)    ETT/Lexiscan-Myoview (11/14):  Normal; no ischemia, EF 67%  . CKD (chronic kidney disease), stage III   . Colon polyp 09/2010   adenoma  . COPD (chronic obstructive pulmonary disease) ([place]) 2009   spirometry with "moderate COPD"  . Depression   . Diabetes mellitus without complication ([place])   . Dyspnea   . GERD (gastroesophageal reflux disease)   . GI bleed    after polypectomy/admitted  . Hair loss 08/07/2018  . HTN (hypertension)   . Hypercholesterolemia   . Myocardial infarction ([place])   . Obesity   . Sleep apnea   . Spontaneous pneumothorax   . Synovial cyst of right popliteal space 07/20/2018  . Vertigo    Past Surgical History:  Procedure Laterality Date  . APPENDECTOMY    . PLEURAL SCARIFICATION  1987  . TONSILLECTOMY    . VARICOCELECTOMY     Family History  Problem Relation Age of Onset  . Heart disease Mother   . CVA Mother   . Dementia Mother   . Coronary artery disease Father 21  . Heart disease Father   . Stroke Father   . Dementia Sister   . Cancer - Other Brother   . Hypertension Other   . Hyperlipidemia Other   . Colon polyps Sister   . Cancer Sister    Allergies as of 04/12/2020      Reactions   Altace [ramipril] Cough   Codeine    Makes sick   Tape     Blisters   Zantac [ranitidine Hcl] Dermatitis      Medication List       Accurate as of Apr 12, 2020 11:59 PM. If you have any questions, ask your nurse or doctor.        aspirin EC 81 MG tablet Take 81 mg by mouth daily.   atorvastatin 80 MG tablet Commonly known as: LIPITOR Take 1 tablet (  80 mg total) by mouth daily.   buPROPion 300 MG 24 hr tablet Commonly known as: WELLBUTRIN XL Take 1 tablet (300 mg total) by mouth daily.   clopidogrel 75 MG tablet Commonly known as: PLAVIX Take 1 tablet (75 mg total) by mouth daily.   diclofenac sodium 1 % Gel Commonly known as: VOLTAREN Apply 4 g topically 4 (four) times daily. To affected joint.   famotidine 20 MG tablet Commonly known as: Pepcid Take 1 tablet (20 mg total) by mouth 2 (two) times daily.   iron polysaccharides 150 MG capsule Commonly known as: NIFEREX Take 1 capsule (150 mg total) by mouth daily.   metFORMIN 1000 MG tablet Commonly known as: GLUCOPHAGE Take 1 tablet (1,000 mg total) by mouth daily with breakfast. diabetes   metoprolol tartrate 50 MG tablet Commonly known as: LOPRESSOR Take 1 tablet (50 mg total) by mouth daily.   mirtazapine 30 MG disintegrating tablet Commonly known as: REMERON SOL-TAB Take 1 tablet (30 mg total) by mouth at bedtime. On tongue   multivitamin tablet Take 1 tablet by mouth daily.   nitroGLYCERIN 0.4 MG SL tablet Commonly known as: NITROSTAT Place 1 tablet (0.4 mg total) under the tongue every 5 (five) minutes as needed for chest pain (3 doses max).   pantoprazole 40 MG tablet Commonly known as: PROTONIX Take 1 tablet (40 mg total) by mouth 2 (two) times daily.   Symbicort 160-4.5 MCG/ACT inhaler Generic drug: budesonide-formoterol Inhale 2 puffs into the lungs 2 (two) times daily.   tamsulosin 0.4 MG Caps capsule Commonly known as: FLOMAX TAKE 1 CAPSULE BY MOUTH ONCE AT NIGHT AT BEDTIME   traZODone 50 MG tablet Commonly known as: DESYREL Take 3 tablets (150  mg total) by mouth at bedtime.   VITAMIN B 12 PO Take by mouth.       No results found for this or any previous visit (from the past 24 hour(s)). No results found.   ROS: Negative, with the exception of above mentioned in HPI   Objective:  BP 135/78 (BP Location: Right Arm, Patient Position: Sitting, Cuff Size: Normal)   Pulse 67   Temp 97.8 F (36.6 C) (Temporal)   Resp 16   Ht 5\' 10"  (1.778 m)   Wt 205 lb 8 oz (93.2 kg)   SpO2 97%   BMI 29.49 kg/m  Body mass index is 29.49 kg/m. Gen: Afebrile. No acute distress.  Nontoxic in presentation.  Very pleasant male. HENT: AT. [place].  Eyes:Pupils Equal Round Reactive to light, Extraocular movements intact,  Conjunctiva without redness, discharge or icterus. Neuro:  Normal gait. PERLA. EOMi. Alert. Oriented x3  Psych: Normal affect, dress and demeanor. Normal speech. Normal thought content and judgment.   Results for orders placed or performed in visit on 04/12/20 (from the past 48 hour(s))  Basic Metabolic Panel (BMET)     Status: Abnormal   Collection Time: 04/12/20 11:36 AM  Result Value Ref Range   Sodium 138 135 - 145 mEq/L   Potassium 4.8 3.5 - 5.1 mEq/L   Chloride 102 96 - 112 mEq/L   CO2 26 19 - 32 mEq/L   Glucose, Bld 160 (H) 70 - 99 mg/dL   BUN 21 6 - 23 mg/dL   Creatinine, Ser 1.26 0.40 - 1.50 mg/dL   GFR 56.18 (L) >60.00 mL/min   Calcium 9.7 8.4 - 10.5 mg/dL    Assessment/Plan: [name] is a 74 y.o. male present for OV for  Memory deficits/cognitive decline:  Patient noting memory deficit.  Family members are also noticing a deficit.  Family history of of unknown type of dementia in his mother and sister. B12, vitamin D, TSH, t4 > normal. -Discussed his multiple sedating medications.  Although he declines sedation, may be contributing to his declining cognitive function.  If MRI does not show obvious cause of cognitive decline we will discuss medicine alterations. -Patient had elevated PSA with prostate  biopsy October 2019 with urology> he continues to follow with them closely> biopsies have been benign.   - MRI brain ordered to rule out tumor, stroke or signs of cortical atrophy as cause of the changes in patient's cognitive function. -We briefly discussed medications that can help slow down the process of cognitive decline.  He understands these medications do not reverse cognitive decline.  Could consider start of Aricept depending upon the MRI results. -He was provided with AVS information on memory compensation strategies. -We will need to bring back in to review MRI results and discuss further plan   Reviewed expectations re: course of current medical issues.  Discussed self-management of symptoms.  Outlined signs and symptoms indicating need for more acute intervention.  Patient verbalized understanding and all questions were answered.  Patient received an After-Visit Summary.     Orders Placed This Encounter  Procedures  . MR Brain W Wo Contrast  . Basic Metabolic Panel (BMET)   No orders of the defined types were placed in this encounter.    electronically signed by:  Howard Pouch, DO  [facility]

## 2020-04-12 NOTE — Patient Instructions (Addendum)
We will order an MRI of your Brain. They will call you to schedule. I will also dig deeper on your depression/anxiety medications and see if we can consider any changes there to help with your memory.   Depending on MRI results we will decide on referrals or medications as next step.    Memory Compensation Strategies  1. Use "WARM" strategy.  W= write it down  A= associate it  R= repeat it  M= make a mental note  2.   You can keep a Social worker.  Use a 3-ring notebook with sections for the following: calendar, important names and phone numbers,  medications, doctors' names/phone numbers, lists/reminders, and a section to journal what you did  each day.   3.    Use a calendar to write appointments down.  4.    Write yourself a schedule for the day.  This can be placed on the calendar or in a separate section of the Memory Notebook.  Keeping a  regular schedule can help memory.  5.    Use medication organizer with sections for each day or morning/evening pills.  You may need help loading it  6.    Keep a basket, or pegboard by the door.  Place items that you need to take out with you in the basket or on the pegboard.  You may also want to  include a message board for reminders.  7.    Use sticky notes.  Place sticky notes with reminders in a place where the task is performed.  For example: " turn off the  stove" placed by the stove, "lock the door" placed on the door at eye level, " take your medications" on  the bathroom mirror or by the place where you normally take your medications.  8.    Use alarms/timers.  Use while cooking to remind yourself to check on food or as a reminder to take your medicine, or as a  reminder to make a call, or as a reminder to perform another task, etc.

## 2020-04-13 ENCOUNTER — Other Ambulatory Visit: Payer: Self-pay | Admitting: Nurse Practitioner

## 2020-04-13 ENCOUNTER — Other Ambulatory Visit: Payer: Self-pay | Admitting: Family Medicine

## 2020-04-13 ENCOUNTER — Encounter: Payer: Self-pay | Admitting: Family Medicine

## 2020-04-13 DIAGNOSIS — Z818 Family history of other mental and behavioral disorders: Secondary | ICD-10-CM | POA: Insufficient documentation

## 2020-04-13 DIAGNOSIS — R4189 Other symptoms and signs involving cognitive functions and awareness: Secondary | ICD-10-CM | POA: Insufficient documentation

## 2020-04-13 DIAGNOSIS — F0393 Unspecified dementia, unspecified severity, with mood disturbance: Secondary | ICD-10-CM | POA: Insufficient documentation

## 2020-04-13 DIAGNOSIS — Z77018 Contact with and (suspected) exposure to other hazardous metals: Secondary | ICD-10-CM

## 2020-04-25 DIAGNOSIS — R3915 Urgency of urination: Secondary | ICD-10-CM | POA: Diagnosis not present

## 2020-04-25 DIAGNOSIS — R350 Frequency of micturition: Secondary | ICD-10-CM | POA: Diagnosis not present

## 2020-05-22 ENCOUNTER — Other Ambulatory Visit: Payer: Self-pay

## 2020-05-22 ENCOUNTER — Ambulatory Visit
Admission: RE | Admit: 2020-05-22 | Discharge: 2020-05-22 | Disposition: A | Discharge status: Home / Self Care | Payer: Medicare HMO | Source: Ambulatory Visit | Attending: Family Medicine | Admitting: Family Medicine

## 2020-05-22 DIAGNOSIS — Z77018 Contact with and (suspected) exposure to other hazardous metals: Secondary | ICD-10-CM

## 2020-05-22 DIAGNOSIS — Z135 Encounter for screening for eye and ear disorders: Secondary | ICD-10-CM | POA: Diagnosis not present

## 2020-05-22 DIAGNOSIS — G309 Alzheimer's disease, unspecified: Secondary | ICD-10-CM

## 2020-05-22 DIAGNOSIS — R4189 Other symptoms and signs involving cognitive functions and awareness: Secondary | ICD-10-CM

## 2020-05-22 DIAGNOSIS — F0390 Unspecified dementia without behavioral disturbance: Secondary | ICD-10-CM | POA: Diagnosis not present

## 2020-05-22 MED ORDER — GADOBENATE DIMEGLUMINE 529 MG/ML IV SOLN
15.0000 mL | Freq: Once | INTRAVENOUS | Status: AC | PRN
  Administered 2020-05-22: 15 mL via INTRAVENOUS

## 2020-05-23 ENCOUNTER — Telehealth: Payer: Self-pay | Admitting: Family Medicine

## 2020-05-23 NOTE — Telephone Encounter (Signed)
Please call patient and schedule him to discuss his MRI results and medications. Briefly, his MRI did show moderate atrophy which could be indication dementia.  There were no masses or tumors suggestion of a malignant cause.  He was asked to make an appointment to discuss the results of his MRI, options for treatment etc., I do not see that appointment is made. Thanks

## 2020-05-24 NOTE — Telephone Encounter (Signed)
Pt was called and message was left to return call  

## 2020-05-25 ENCOUNTER — Other Ambulatory Visit: Payer: Self-pay | Admitting: Family Medicine

## 2020-05-25 DIAGNOSIS — L255 Unspecified contact dermatitis due to plants, except food: Secondary | ICD-10-CM | POA: Diagnosis not present

## 2020-05-25 DIAGNOSIS — F325 Major depressive disorder, single episode, in full remission: Secondary | ICD-10-CM

## 2020-05-25 NOTE — Telephone Encounter (Signed)
Pt returned call and was given information, he was scheduled for appt

## 2020-06-06 ENCOUNTER — Encounter: Payer: Self-pay | Admitting: Physician Assistant

## 2020-06-06 ENCOUNTER — Ambulatory Visit (INDEPENDENT_AMBULATORY_CARE_PROVIDER_SITE_OTHER): Payer: Medicare HMO | Admitting: Physician Assistant

## 2020-06-06 ENCOUNTER — Other Ambulatory Visit: Payer: Self-pay

## 2020-06-06 VITALS — BP 130/82 | HR 63 | Temp 98.5°F | Resp 16 | Ht 70.0 in | Wt 202.0 lb

## 2020-06-06 DIAGNOSIS — L247 Irritant contact dermatitis due to plants, except food: Secondary | ICD-10-CM

## 2020-06-06 DIAGNOSIS — L739 Follicular disorder, unspecified: Secondary | ICD-10-CM

## 2020-06-06 MED ORDER — PREDNISONE 10 MG PO TABS: ORAL_TABLET | ORAL | 0 refills | Status: DC

## 2020-06-06 NOTE — Patient Instructions (Signed)
Please continue your prescription cream twice daily to the arm.  Take the prednisone taper as directed, minimizing carbs and keeping a close eye on glucose (sugar) levels. If > 280, please let us or Dr. Raoul Pitch know.   You can apply OTC Sarna lotion or Witch hazel to the area as well.  Let us know if things do not continue to resolve.   For the back of the scalp there is some area of mild follcular inflammation but also more of the contact dermatitis. Keep the area clean and dry. Avoid wearing a tight fitting or damp hat. Apply the witch hazel to the area 1-2 x daily. If not clearing up along with everything else or if anything worsens in this area or pain develops, please let me know.   Hang in there!

## 2020-06-06 NOTE — Progress Notes (Signed)
Patient presents to clinic today c/o recurrence of poison sumac rash.  States he has sumac in his yard.  Was due when yard work a few weeks ago when he came into contact with the plant.  Developed a papulovesicular erythematous rash over his arms and upper torso.  Notes he was seen at an urgent care diagnosed with contact dermatitis.  Notes he was given a steroid shot and 4 days of a steroid tablet to take.  Notes completing entire course of medicine with improvement of symptoms but still having residual pruritic rash of upper right arm.  Also notes similar area of posterior scalp.  Notes itching is quite intense.  Denies any drainage or tenderness around the area.  Denies fever, chills, malaise or fatigue.   Past Medical History:  Diagnosis Date  . Achilles tendinitis of left lower extremity 07/20/2018  . Adenomatous colon polyp 2011  . Barrett esophagus 12/2013   EGD  . CAD (coronary artery disease)    ETT/Lexiscan-Myoview (11/14):  Normal; no ischemia, EF 67%  . CKD (chronic kidney disease), stage III   . Colon polyp 09/2010   adenoma  . COPD (chronic obstructive pulmonary disease) ([place]) 2009   spirometry with "moderate COPD"  . Depression   . Diabetes mellitus without complication ([place])   . Dyspnea   . GERD (gastroesophageal reflux disease)   . GI bleed    after polypectomy/admitted  . Hair loss 08/07/2018  . HTN (hypertension)   . Hypercholesterolemia   . Myocardial infarction ([place])   . Obesity   . Sleep apnea   . Spontaneous pneumothorax   . Synovial cyst of right popliteal space 07/20/2018  . Vertigo     Current Outpatient Medications on File Prior to Visit  Medication Sig Dispense Refill  . aspirin EC 81 MG tablet Take 81 mg by mouth daily.     Marland Kitchen atorvastatin (LIPITOR) 80 MG tablet Take 1 tablet (80 mg total) by mouth daily. 90 tablet 3  . buPROPion (WELLBUTRIN XL) 300 MG 24 hr tablet Take 1 tablet (300 mg total) by mouth daily. 90 tablet 1  . clopidogrel (PLAVIX) 75 MG  tablet Take 1 tablet (75 mg total) by mouth daily. Please schedule yearly follow up visit for future refills [phone] 90 tablet 0  . Cyanocobalamin (VITAMIN B 12 PO) Take by mouth.    . diclofenac sodium (VOLTAREN) 1 % GEL Apply 4 g topically 4 (four) times daily. To affected joint. 100 g 11  . famotidine (PEPCID) 20 MG tablet Take 1 tablet (20 mg total) by mouth 2 (two) times daily. 180 tablet 3  . iron polysaccharides (NIFEREX) 150 MG capsule Take 1 capsule (150 mg total) by mouth daily. 30 capsule 2  . metFORMIN (GLUCOPHAGE) 1000 MG tablet Take 1 tablet (1,000 mg total) by mouth daily with breakfast. diabetes 90 tablet 1  . metoprolol tartrate (LOPRESSOR) 50 MG tablet Take 1 tablet (50 mg total) by mouth daily. 90 tablet 1  . mirtazapine (REMERON SOL-TAB) 30 MG disintegrating tablet Take 1 tablet (30 mg total) by mouth at bedtime. On tongue 90 tablet 1  . Multiple Vitamin (MULTIVITAMIN) tablet Take 1 tablet by mouth daily.      . nitroGLYCERIN (NITROSTAT) 0.4 MG SL tablet Place 1 tablet (0.4 mg total) under the tongue every 5 (five) minutes as needed for chest pain (3 doses max). 25 tablet 3  . pantoprazole (PROTONIX) 40 MG tablet Take 1 tablet (40 mg total) by mouth 2 (two)  times daily. 180 tablet 3  . SYMBICORT 160-4.5 MCG/ACT inhaler Inhale 2 puffs into the lungs 2 (two) times daily. 3 Inhaler 3  . tamsulosin (FLOMAX) 0.4 MG CAPS capsule TAKE 1 CAPSULE BY MOUTH ONCE AT NIGHT AT BEDTIME  11  . traZODone (DESYREL) 50 MG tablet Take 3 tablets (150 mg total) by mouth at bedtime. 270 tablet 1   No current facility-administered medications on file prior to visit.    Allergies  Allergen Reactions  . Altace [Ramipril] Cough  . Codeine     Makes sick  . Tape     Blisters   . Zantac [Ranitidine Hcl] Dermatitis    Family History  Problem Relation Age of Onset  . Heart disease Mother   . CVA Mother   . Dementia Mother   . Coronary artery disease Father 6  . Heart disease Father   .  Stroke Father   . Dementia Sister   . Cancer - Other Brother   . Hypertension Other   . Hyperlipidemia Other   . Colon polyps Sister   . Cancer Sister     Social History   Socioeconomic History  . Marital status: Married    Spouse name: Not on file  . Number of children: Not on file  . Years of education: Not on file  . Highest education level: Not on file  Occupational History  . Occupation: Furniture conservator/restorer  Tobacco Use  . Smoking status: Former Smoker    Types: Cigarettes    Quit date: 11/25/1998    Years since quitting: 21.5  . Smokeless tobacco: Never Used  Vaping Use  . Vaping Use: Never used  Substance and Sexual Activity  . Alcohol use: No  . Drug use: No  . Sexual activity: Yes  Other Topics Concern  . Not on file  Social History Narrative   Married to [place].   Retired Production designer, theatre/television/film truck.    Drinks caffeine,  Takes a daily vitamin, wears a seatbelt   Wears a bicycle helmet.    Has dentures (partial on top)   Smoke detector in the home. No firearms in the home.    Feels safe in relationships.     Social Determinants of Health   Financial Resource Strain:   . Difficulty of Paying Living Expenses:   Food Insecurity:   . Worried About Charity fundraiser in the Last Year:   . Arboriculturist in the Last Year:   Transportation Needs:   . Film/video editor (Medical):   Marland Kitchen Lack of Transportation (Non-Medical):   Physical Activity:   . Days of Exercise per Week:   . Minutes of Exercise per Session:   Stress:   . Feeling of Stress :   Social Connections:   . Frequency of Communication with Friends and Family:   . Frequency of Social Gatherings with Friends and Family:   . Attends Religious Services:   . Active Member of Clubs or Organizations:   . Attends Archivist Meetings:   Marland Kitchen Marital Status:    Review of Systems - See HPI.  All other ROS are negative.  BP 130/82   Pulse 63   Temp 98.5 F (36.9 C) (Temporal)   Resp 16   Ht 5\' 10"  (1.778 m)    Wt 202 lb (91.6 kg)   SpO2 98%   BMI 28.98 kg/m   Physical Exam Vitals reviewed.  Constitutional:      Appearance: Normal appearance.  Cardiovascular:  Rate and Rhythm: Normal rate and regular rhythm.     Heart sounds: Normal heart sounds.  Pulmonary:     Effort: Pulmonary effort is normal.     Breath sounds: Normal breath sounds.  Musculoskeletal:     Cervical back: Neck supple.  Skin:      Neurological:     Mental Status: He is alert.     Recent Results (from the past 2160 hour(s))  POCT glycosylated hemoglobin (Hb A1C)     Status: Abnormal   Collection Time: 03/10/20 10:02 AM  Result Value Ref Range   Hemoglobin A1C 7.1 (A) 4.0 - 5.6 %   HbA1c POC (<> result, manual entry) 7.1 4.0 - 5.6 %   HbA1c, POC (prediabetic range) 7.1 (A) 5.7 - 6.4 %   HbA1c, POC (controlled diabetic range) 7.1 (A) 0.0 - 7.0 %  Urine Microalbumin w/creat. ratio     Status: None   Collection Time: 03/10/20 10:47 AM  Result Value Ref Range   Microalb, Ur <0.7 0.0 - 1.9 mg/dL   Creatinine,U 33.9 mg/dL   Microalb Creat Ratio 2.1 0.0 - 30.0 mg/g  Magnesium     Status: None   Collection Time: 03/10/20 10:47 AM  Result Value Ref Range   Magnesium 1.6 1.5 - 2.5 mg/dL  B12 and Folate Panel     Status: None   Collection Time: 03/10/20 10:47 AM  Result Value Ref Range   Vitamin B-12 664 211 - 911 pg/mL   Folate 14.0 >5.9 ng/mL  Vitamin D (25 hydroxy)     Status: None   Collection Time: 03/10/20 10:47 AM  Result Value Ref Range   VITD 40.64 30.00 - 100.00 ng/mL  TSH     Status: None   Collection Time: 03/10/20 10:47 AM  Result Value Ref Range   TSH 0.54 0.35 - 4.50 uIU/mL  T4, free     Status: None   Collection Time: 03/10/20 10:47 AM  Result Value Ref Range   Free T4 0.70 0.60 - 1.60 ng/dL    Comment: Specimens from patients who are undergoing biotin therapy and /or ingesting biotin supplements may contain high levels of biotin.  The higher biotin concentration in these specimens  interferes with this Free T4 assay.  Specimens that contain high levels  of biotin may cause false high results for this Free T4 assay.  Please interpret results in light of the total clinical presentation of the patient.    Iron, TIBC and Ferritin Panel     Status: None   Collection Time: 03/10/20 10:47 AM  Result Value Ref Range   Iron 66 50 - 180 mcg/dL   TIBC 281 250 - 425 mcg/dL (calc)   %SAT 23 20 - 48 % (calc)   Ferritin 81 24 - 380 ng/mL  Basic Metabolic Panel (BMET)     Status: Abnormal   Collection Time: 04/12/20 11:36 AM  Result Value Ref Range   Sodium 138 135 - 145 mEq/L   Potassium 4.8 3.5 - 5.1 mEq/L   Chloride 102 96 - 112 mEq/L   CO2 26 19 - 32 mEq/L   Glucose, Bld 160 (H) 70 - 99 mg/dL   BUN 21 6 - 23 mg/dL   Creatinine, Ser 1.26 0.40 - 1.50 mg/dL   GFR 56.18 (L) >60.00 mL/min   Calcium 9.7 8.4 - 10.5 mg/dL    Assessment/Plan: 1. Irritant contact dermatitis due to plants, except food Ongoing due to inadequate course of steroid for contact dermatitis.  Suspect mild rebound dermatitis of scalp as well.  Rx prednisone taper starting at 40 mg daily x3 days then decreasing by 10 mg every 3 days until completion.  Okay to continue steroid cream.  Recommend witch hazel astringent to the area.  Supportive measures reviewed.  Follow-up if symptoms or not resolving. - predniSONE (DELTASONE) 10 MG tablet; Take 4 tablets (40 mg total) by mouth daily with breakfast for 3 days, THEN 3 tablets (30 mg total) daily with breakfast for 3 days, THEN 2 tablets (20 mg total) daily with breakfast for 3 days, THEN 1 tablet (10 mg total) daily with breakfast for 3 days.  Dispense: 30 tablet; Refill: 0  2. Folliculitis Very mild.  Suspect secondary to scratching the area contact dermatitis.  Supportive measures reviewed.  Start witch hazel astringent.  Since there is no tenderness or evidence of cellulitis in the area will hold off on oral antibiotic at present.  He is to monitor symptoms  closely and let me know if things are not resolving.  This visit occurred during the SARS-CoV-2 public health emergency.  Safety protocols were in place, including screening questions prior to the visit, additional usage of staff PPE, and extensive cleaning of exam room while observing appropriate contact time as indicated for disinfecting solutions.     Leeanne Rio, PA-C

## 2020-06-08 ENCOUNTER — Ambulatory Visit (INDEPENDENT_AMBULATORY_CARE_PROVIDER_SITE_OTHER): Payer: Medicare HMO | Admitting: Family Medicine

## 2020-06-08 ENCOUNTER — Encounter: Payer: Self-pay | Admitting: Family Medicine

## 2020-06-08 ENCOUNTER — Other Ambulatory Visit: Payer: Self-pay

## 2020-06-08 VITALS — BP 137/81 | HR 70 | Temp 98.3°F | Resp 16 | Ht 70.0 in | Wt 203.0 lb

## 2020-06-08 DIAGNOSIS — G319 Degenerative disease of nervous system, unspecified: Secondary | ICD-10-CM | POA: Diagnosis not present

## 2020-06-08 DIAGNOSIS — Z818 Family history of other mental and behavioral disorders: Secondary | ICD-10-CM

## 2020-06-08 DIAGNOSIS — I679 Cerebrovascular disease, unspecified: Secondary | ICD-10-CM | POA: Diagnosis not present

## 2020-06-08 DIAGNOSIS — R4189 Other symptoms and signs involving cognitive functions and awareness: Secondary | ICD-10-CM

## 2020-06-08 DIAGNOSIS — I6782 Cerebral ischemia: Secondary | ICD-10-CM | POA: Insufficient documentation

## 2020-06-08 MED ORDER — PAROXETINE HCL 20 MG PO TABS: 20.0000 mg | ORAL_TABLET | Freq: Every day | ORAL | 1 refills | Status: DC

## 2020-06-08 NOTE — Progress Notes (Signed)
[name] , February 22, 1946, 74 y.o., male MRN: [id number] Patient Care Team    Relationship Specialty Notifications Start End  [name], DO PCP - General Family Medicine  12/25/15   Josue Hector, MD PCP - Cardiology Cardiology Admissions 08/20/18   Brunetta Genera, MD Consulting Physician Hematology  09/03/16   Josue Hector, MD Consulting Physician Cardiology  09/03/16   Wilford Corner, MD Consulting Physician Gastroenterology  09/03/16   Melina Schools, OD  Optometry  09/03/16   Kathie Rhodes, MD Consulting Physician Urology  09/10/16   Hollar, Katharine Look, MD Referring Physician Dermatology  06/29/19     Chief Complaint  Patient presents with  . Follow-up    Pt here to review MRI     Subjective: [name] is a 74 y.o. male present to discuss cognitive decline and MRI results.      Prior note 519/2021: We briefly discussed his memory changes at that time and added labs to further evaluate memory and then brought him back today to discuss in more detail.   Patient's iron panel, vitamin D and B12 levels were normal as well as his thyroid function 4 weeks ago. Patient has a history of depression/anxiety, hypertension, coronary artery disease, reflux, diabetes, prior history of MI, iron deficiency and elevated PSA. He is on multiple medications for depression anxiety which do have a sedating side effect.  He denies sedation and states he still wakes up in the middle the night and sometimes has difficulty falling back asleep.  He does endorse feeling like he has had increased anxiety over the last few months, despite treatment.  He is on high-dose atorvastatin 80 mg and Plavix. He reports there is a family history of dementia in his mother and sister.  He is uncertain what type of dementia either of them had.  He reports he noticed his mother's dementia more prominent after her stroke when she was in her 1s and his sister dementia also occurred later in  life. [name] reports he had noticed his memory decline "a couple years ago.  " Over the last year his wife has also brought his memory declined to his attention.  He reports his wife first noticed he was having trouble finding the words he was trying to say.  He states he also noticed he would have difficulty thinking of acquaintances names, or people on the television, despite knowing who they were.  He denies having any difficulty with driving or getting lost.  He denies any difficulty with IADLs/ADLs.  He does forget conversations.  His wife has told him that he will repeat conversations or questions that they already had.  He actually forgot that he brought up his memory decline last appointment with this provider or that this appointment was made for his memory decline.  Cognitive assessment: Word recall: 2/3 Clock draw: Patient drew a round clock.  Numbers were in the correct order with mildly abnormal spacing (9 is in the 10 location, 10 is an 11 location, 11 in the noon location and 12 was not present).   Depression screen [facility] 2/9 03/10/2020 08/18/2019 06/29/2019 02/12/2019 08/07/2018  Decreased Interest 0 3 0 2 0  Down, Depressed, Hopeless 0 1 0 2 0  PHQ - 2 Score 0 4 0 4 0  Altered sleeping 0 0 0 1 1  Tired, decreased energy 3 1 0 2 0  Change in appetite 0 1 0 3 0  Feeling bad  or failure about yourself  0 0 0 1 0  Trouble concentrating 1 0 0 1 1  Moving slowly or fidgety/restless 0 0 0 0 0  Suicidal thoughts 0 0 0 0 0  PHQ-9 Score 4 6 0 12 2  Difficult doing work/chores Not difficult at all - Not difficult at all Somewhat difficult Not difficult at all  Some recent data might be hidden   GAD 7 : Generalized Anxiety Score 02/12/2019 12/25/2015  Nervous, Anxious, on Edge 0 2  Control/stop worrying 3 2  Worry too much - different things 3 3  Trouble relaxing 3 2  Restless 3 2  Easily annoyed or irritable 3 2  Afraid - awful might happen 0 0  Total GAD 7 Score 15 13  Anxiety  Difficulty Somewhat difficult Somewhat difficult    Allergies  Allergen Reactions  . Altace [Ramipril] Cough  . Codeine     Makes sick  . Tape     Blisters   . Zantac [Ranitidine Hcl] Dermatitis   Social History   Tobacco Use  . Smoking status: Former Smoker    Types: Cigarettes    Quit date: 11/25/1998    Years since quitting: 21.5  . Smokeless tobacco: Never Used  Substance Use Topics  . Alcohol use: No   Past Medical History:  Diagnosis Date  . Achilles tendinitis of left lower extremity 07/20/2018  . Adenomatous colon polyp 2011  . Barrett esophagus 12/2013   EGD  . CAD (coronary artery disease)    ETT/Lexiscan-Myoview (11/14):  Normal; no ischemia, EF 67%  . CKD (chronic kidney disease), stage III   . Colon polyp 09/2010   adenoma  . COPD (chronic obstructive pulmonary disease) ([place]) 2009   spirometry with "moderate COPD"  . Depression   . Diabetes mellitus without complication ([place])   . Dyspnea   . GERD (gastroesophageal reflux disease)   . GI bleed    after polypectomy/admitted  . Hair loss 08/07/2018  . HTN (hypertension)   . Hypercholesterolemia   . Myocardial infarction ([place])   . Obesity   . Sleep apnea   . Spontaneous pneumothorax   . Synovial cyst of right popliteal space 07/20/2018  . Vertigo    Past Surgical History:  Procedure Laterality Date  . APPENDECTOMY    . PLEURAL SCARIFICATION  1987  . TONSILLECTOMY    . VARICOCELECTOMY     Family History  Problem Relation Age of Onset  . Heart disease Mother   . CVA Mother   . Dementia Mother   . Coronary artery disease Father 21  . Heart disease Father   . Stroke Father   . Dementia Sister   . Cancer - Other Brother   . Hypertension Other   . Hyperlipidemia Other   . Colon polyps Sister   . Cancer Sister    Allergies as of 06/08/2020      Reactions   Altace [ramipril] Cough   Codeine    Makes sick   Tape    Blisters   Zantac [ranitidine Hcl] Dermatitis      Medication List        Accurate as of June 08, 2020  2:57 PM. If you have any questions, ask your nurse or doctor.        STOP taking these medications   mirtazapine 30 MG disintegrating tablet Commonly known as: REMERON SOL-TAB Stopped by: Howard Pouch, DO   predniSONE 10 MG tablet Commonly known as: DELTASONE Stopped by:  Howard Pouch, DO     TAKE these medications   aspirin EC 81 MG tablet Take 81 mg by mouth daily.   atorvastatin 80 MG tablet Commonly known as: LIPITOR Take 1 tablet (80 mg total) by mouth daily.   buPROPion 300 MG 24 hr tablet Commonly known as: WELLBUTRIN XL Take 1 tablet (300 mg total) by mouth daily.   clopidogrel 75 MG tablet Commonly known as: PLAVIX Take 1 tablet (75 mg total) by mouth daily. Please schedule yearly follow up visit for future refills [phone]   diclofenac sodium 1 % Gel Commonly known as: VOLTAREN Apply 4 g topically 4 (four) times daily. To affected joint.   famotidine 20 MG tablet Commonly known as: Pepcid Take 1 tablet (20 mg total) by mouth 2 (two) times daily.   iron polysaccharides 150 MG capsule Commonly known as: NIFEREX Take 1 capsule (150 mg total) by mouth daily.   metFORMIN 1000 MG tablet Commonly known as: GLUCOPHAGE Take 1 tablet (1,000 mg total) by mouth daily with breakfast. diabetes   metoprolol tartrate 50 MG tablet Commonly known as: LOPRESSOR Take 1 tablet (50 mg total) by mouth daily.   multivitamin tablet Take 1 tablet by mouth daily.   nitroGLYCERIN 0.4 MG SL tablet Commonly known as: NITROSTAT Place 1 tablet (0.4 mg total) under the tongue every 5 (five) minutes as needed for chest pain (3 doses max).   pantoprazole 40 MG tablet Commonly known as: PROTONIX Take 1 tablet (40 mg total) by mouth 2 (two) times daily.   PARoxetine 20 MG tablet Commonly known as: PAXIL Take 1 tablet (20 mg total) by mouth daily. Started by: Howard Pouch, DO   Symbicort 160-4.5 MCG/ACT inhaler Generic drug:  budesonide-formoterol Inhale 2 puffs into the lungs 2 (two) times daily.   tamsulosin 0.4 MG Caps capsule Commonly known as: FLOMAX TAKE 1 CAPSULE BY MOUTH ONCE AT NIGHT AT BEDTIME   traZODone 50 MG tablet Commonly known as: DESYREL Take 3 tablets (150 mg total) by mouth at bedtime.   VITAMIN B 12 PO Take by mouth.       No results found for this or any previous visit (from the past 24 hour(s)). No results found.   ROS: Negative, with the exception of above mentioned in HPI   Objective:  BP 137/81 (BP Location: Left Arm, Patient Position: Sitting, Cuff Size: Normal)   Pulse 70   Temp 98.3 F (36.8 C) (Temporal)   Resp 16   Ht 5\' 10"  (1.778 m)   Wt 203 lb (92.1 kg)   SpO2 98%   BMI 29.13 kg/m  Body mass index is 29.13 kg/m. Gen: Afebrile. No acute distress.  HENT: AT. [place].  Eyes:Pupils Equal Round Reactive to light, Extraocular movements intact,  Conjunctiva without redness, discharge or icterus. CV: RRR Chest: CTAB, no wheeze or crackles Neuro:  Normal gait. PERLA. EOMi. Alert. Oriented x3  Psych: Normal affect, dress and demeanor. Normal speech. Normal thought content and judgment.  No results found for this or any previous visit (from the past 48 hour(s)).  Assessment/Plan: [name] is a 74 y.o. male present for OV for  cognitive decline/cerebral atrophy/family history of dementia: Patient noting memory deficit.  Family members are also noticing a deficit.  Family history of of unknown type of dementia in his mother and sister. B12, vitamin D, TSH, t4 > normal. -Discussed his multiple sedating medications.  Although he declines sedation, may be contributing to his declining cognitive function> Remeron was  last added around the time he is reporting his cognitive decline started to be more noticeable, therefore will taper off Remeron today.  Patient was instructed to take a half a tab nightly next 2 weeks and then can discontinue use of Remeron. -Continue  Wellbutrin 300 mg daily -Added Paxil 20 mg daily for added depression coverage. -We will continue trazodone 150 nightly, at least for now. -Patient had elevated PSA with prostate biopsy October 2019 with urology> he continues to follow with them closely> biopsies have been benign.   - MRI brain 05/22/2020> moderate generalized atrophy.  Mild microvascular ischemic changes-chronic, otherwise normal. -Discussed neurology referral for evaluation and treatment of cognitive decline and changes appreciated on MRI.  He is agreeable to this plan today. -He was provided with AVS information on memory compensation strategies.   Reviewed expectations re: course of current medical issues.  Discussed self-management of symptoms.  Outlined signs and symptoms indicating need for more acute intervention.  Patient verbalized understanding and all questions were answered.  Patient received an After-Visit Summary.     Orders Placed This Encounter  Procedures  . Ambulatory referral to Neurology   Meds ordered this encounter  Medications  . PARoxetine (PAXIL) 20 MG tablet    Sig: Take 1 tablet (20 mg total) by mouth daily.    Dispense:  90 tablet    Refill:  1     electronically signed by:  Howard Pouch, DO  [place]

## 2020-06-08 NOTE — Patient Instructions (Addendum)
Cut Remeron tab in half for 2 weeks. Then discontinue.   Start paxil daily- called into mail-in pharmacy for you.   I have referred you to neurology to get their opinion> they will call you to schedule.     You have your chronic conditions appt mid August already scheduled. Keep that appt.

## 2020-07-10 ENCOUNTER — Other Ambulatory Visit: Payer: Self-pay

## 2020-07-10 ENCOUNTER — Encounter: Payer: Self-pay | Admitting: Family Medicine

## 2020-07-10 ENCOUNTER — Ambulatory Visit (INDEPENDENT_AMBULATORY_CARE_PROVIDER_SITE_OTHER): Payer: Medicare HMO | Admitting: Family Medicine

## 2020-07-10 VITALS — BP 131/81 | HR 65 | Temp 98.0°F | Resp 20 | Ht 70.0 in | Wt 208.0 lb

## 2020-07-10 DIAGNOSIS — E7800 Pure hypercholesterolemia, unspecified: Secondary | ICD-10-CM

## 2020-07-10 DIAGNOSIS — Z79899 Other long term (current) drug therapy: Secondary | ICD-10-CM

## 2020-07-10 DIAGNOSIS — R413 Other amnesia: Secondary | ICD-10-CM | POA: Diagnosis not present

## 2020-07-10 DIAGNOSIS — Z5181 Encounter for therapeutic drug level monitoring: Secondary | ICD-10-CM

## 2020-07-10 DIAGNOSIS — H00014 Hordeolum externum left upper eyelid: Secondary | ICD-10-CM

## 2020-07-10 DIAGNOSIS — F3341 Major depressive disorder, recurrent, in partial remission: Secondary | ICD-10-CM

## 2020-07-10 DIAGNOSIS — E119 Type 2 diabetes mellitus without complications: Secondary | ICD-10-CM

## 2020-07-10 DIAGNOSIS — I10 Essential (primary) hypertension: Secondary | ICD-10-CM | POA: Diagnosis not present

## 2020-07-10 DIAGNOSIS — I2510 Atherosclerotic heart disease of native coronary artery without angina pectoris: Secondary | ICD-10-CM | POA: Diagnosis not present

## 2020-07-10 DIAGNOSIS — R4189 Other symptoms and signs involving cognitive functions and awareness: Secondary | ICD-10-CM

## 2020-07-10 DIAGNOSIS — K219 Gastro-esophageal reflux disease without esophagitis: Secondary | ICD-10-CM | POA: Diagnosis not present

## 2020-07-10 DIAGNOSIS — I252 Old myocardial infarction: Secondary | ICD-10-CM

## 2020-07-10 DIAGNOSIS — I679 Cerebrovascular disease, unspecified: Secondary | ICD-10-CM

## 2020-07-10 DIAGNOSIS — F325 Major depressive disorder, single episode, in full remission: Secondary | ICD-10-CM

## 2020-07-10 DIAGNOSIS — D500 Iron deficiency anemia secondary to blood loss (chronic): Secondary | ICD-10-CM

## 2020-07-10 DIAGNOSIS — Z818 Family history of other mental and behavioral disorders: Secondary | ICD-10-CM

## 2020-07-10 DIAGNOSIS — G319 Degenerative disease of nervous system, unspecified: Secondary | ICD-10-CM

## 2020-07-10 DIAGNOSIS — N401 Enlarged prostate with lower urinary tract symptoms: Secondary | ICD-10-CM | POA: Insufficient documentation

## 2020-07-10 LAB — POCT GLYCOSYLATED HEMOGLOBIN (HGB A1C): Hemoglobin A1C: 7.1 % — AB (ref 4.0–5.6)

## 2020-07-10 MED ORDER — METOPROLOL TARTRATE 50 MG PO TABS: 50.0000 mg | ORAL_TABLET | Freq: Every day | ORAL | 1 refills | Status: DC

## 2020-07-10 MED ORDER — BUPROPION HCL ER (XL) 300 MG PO TB24: 300.0000 mg | ORAL_TABLET | Freq: Every day | ORAL | 1 refills | Status: DC

## 2020-07-10 MED ORDER — ERYTHROMYCIN 5 MG/GM OP OINT: 1.0000 "application " | TOPICAL_OINTMENT | Freq: Every day | OPHTHALMIC | 0 refills | Status: AC

## 2020-07-10 MED ORDER — PAROXETINE HCL 40 MG PO TABS: 40.0000 mg | ORAL_TABLET | Freq: Every day | ORAL | 1 refills | Status: DC

## 2020-07-10 MED ORDER — CLOPIDOGREL BISULFATE 75 MG PO TABS: 75.0000 mg | ORAL_TABLET | Freq: Every day | ORAL | 3 refills | Status: DC

## 2020-07-10 MED ORDER — SYMBICORT 160-4.5 MCG/ACT IN AERO: 2.0000 | INHALATION_SPRAY | Freq: Two times a day (BID) | RESPIRATORY_TRACT | 4 refills | Status: DC

## 2020-07-10 MED ORDER — ATORVASTATIN CALCIUM 80 MG PO TABS: 80.0000 mg | ORAL_TABLET | Freq: Every day | ORAL | 3 refills | Status: DC

## 2020-07-10 MED ORDER — TRAZODONE HCL 50 MG PO TABS: 150.0000 mg | ORAL_TABLET | Freq: Every day | ORAL | 1 refills | Status: DC

## 2020-07-10 MED ORDER — METFORMIN HCL 1000 MG PO TABS: 1000.0000 mg | ORAL_TABLET | Freq: Two times a day (BID) | ORAL | 1 refills | Status: DC

## 2020-07-10 NOTE — Patient Instructions (Addendum)
  Metformin increase to twice a day  (breakfast and dinner).   Paxil increase dose to 40 mg a day. (you can take 2 tabs of 20 mg tabs you currently have- NEW pill will be 40 mg each> only take one once you pick up new pill).   Placed referral back to urology for you. They should call you to schedule.     Follow up on 4 months on chronic conditions.

## 2020-07-10 NOTE — Progress Notes (Signed)
[name] , 05/12/1946, 74 y.o., male MRN: [id number] Patient Care Team    Relationship Specialty Notifications Start End  [name], DO PCP - General Family Medicine  12/25/15   Josue Hector, MD PCP - Cardiology Cardiology Admissions 08/20/18   Brunetta Genera, MD Consulting Physician Hematology  09/03/16   Josue Hector, MD Consulting Physician Cardiology  09/03/16   Wilford Corner, MD Consulting Physician Gastroenterology  09/03/16   Melina Schools, OD  Optometry  09/03/16   Kathie Rhodes, MD Consulting Physician Urology  09/10/16   Hollar, Katharine Look, MD Referring Physician Dermatology  06/29/19     Chief Complaint  Patient presents with  . Follow-up    4 month follow up   . Eye lid bump    left eye lid    Subjective: [name] is a 74 y.o. male present for [facility] Recurrent major depressive disorder, in partial remission (HCC)/memory changes Patient feels that his depression is doing ok. He was having some increase in symptoms, but also experiencing mild cognitive changes. .  Current medication regimen of trazodone, paxil and Wellbutrin. He denies negative side effects occasions.  He and his wife both have noticed he is having some memory changes which have him concerned- which has mildly improved since stopping Remeron and starting low dose paxil.  He reports both his mother and his sisters had dementia.  He feels he is becoming more forgetful.  His wife has told him he will talk about the same things that they had already talked about prior.  Gastroesophageal reflux disease, esophagitis presence not specified  EGD 03/2016 by Dr. Michail Sermon with signs of barrett's esophagus. Patient was tried on lower dose medication, and was unable to tolerate secondary to return of symptoms.  His symptoms are controlled protonix 40 BID and pepcid BID.   COPD ([place]) He uses Symbicort daily, Spiriva needed during certain times of the year only. His pneumonia series is  completed.  Symptoms are well controlled on Symbicort only  Atherosclerosis of native coronary artery of native heart without angina pectoris/Essential hypertension/HYPERCHOLESTEROLEMIA/History of MI (myocardial infarction)/IDA Pt reports compliance with metoprolol 50 mg daily. Blood pressures ranges at home are not routinely checked. Patient denies chest pain, shortness of breath, dizziness or lower extremity edema.   Pt takes a daily baby ASA and Plavix. Pt is  prescribed statin and followed by Dr. Johnsie Cancel Cardiology. Labs up-to-date 08/18/2019 Diet: Low-sodium RF: Hypertension, hyperlipidemia, diabetes  Type 2 diabetes mellitus without complication, without long-term current use of insulin ([place]) patient reports compliance with metformin 1000 mg QD  Pt reports BG ranges not routinely checked. - Foot exam: 03/10/2020 - eye exam: 03/30/2019 - PNA: Series completed - Flu: UTD 2020 , encouraged yearly - Urine Microalbumin w/creat. Ratio: completed 03/10/2020 Last A1c 6.5> 7.1>7.1 today   Depression screen [facility] 2/9 07/10/2020 03/10/2020 08/18/2019 06/29/2019 02/12/2019  Decreased Interest 0 0 3 0 2  Down, Depressed, Hopeless 0 0 1 0 2  PHQ - 2 Score 0 0 4 0 4  Altered sleeping 0 0 0 0 1  Tired, decreased energy 0 3 1 0 2  Change in appetite 0 0 1 0 3  Feeling bad or failure about yourself  0 0 0 0 1  Trouble concentrating 0 1 0 0 1  Moving slowly or fidgety/restless 0 0 0 0 0  Suicidal thoughts 0 0 0 0 0  PHQ-9 Score 0 4 6 0 12  Difficult  doing work/chores - Not difficult at all - Not difficult at all Somewhat difficult  Some recent data might be hidden   GAD 7 : Generalized Anxiety Score 02/12/2019 12/25/2015  Nervous, Anxious, on Edge 0 2  Control/stop worrying 3 2  Worry too much - different things 3 3  Trouble relaxing 3 2  Restless 3 2  Easily annoyed or irritable 3 2  Afraid - awful might happen 0 0  Total GAD 7 Score 15 13  Anxiety Difficulty Somewhat difficult Somewhat difficult     Allergies  Allergen Reactions  . Altace [Ramipril] Cough  . Codeine     Makes sick  . Tape     Blisters   . Zantac [Ranitidine Hcl] Dermatitis   Social History   Tobacco Use  . Smoking status: Former Smoker    Types: Cigarettes    Quit date: 11/25/1998    Years since quitting: 21.6  . Smokeless tobacco: Never Used  Substance Use Topics  . Alcohol use: No   Past Medical History:  Diagnosis Date  . Achilles tendinitis of left lower extremity 07/20/2018  . Adenomatous colon polyp 2011  . Barrett esophagus 12/2013   EGD  . CAD (coronary artery disease)    ETT/Lexiscan-Myoview (11/14):  Normal; no ischemia, EF 67%  . CKD (chronic kidney disease), stage III   . Colon polyp 09/2010   adenoma  . COPD (chronic obstructive pulmonary disease) ([place]) 2009   spirometry with "moderate COPD"  . Depression   . Diabetes mellitus without complication ([place])   . Dyspnea   . GERD (gastroesophageal reflux disease)   . GI bleed    after polypectomy/admitted  . Hair loss 08/07/2018  . HTN (hypertension)   . Hypercholesterolemia   . Myocardial infarction ([place])   . Obesity   . Sleep apnea   . Spontaneous pneumothorax   . Synovial cyst of right popliteal space 07/20/2018  . Vertigo    Past Surgical History:  Procedure Laterality Date  . APPENDECTOMY    . PLEURAL SCARIFICATION  1987  . TONSILLECTOMY    . VARICOCELECTOMY     Family History  Problem Relation Age of Onset  . Heart disease Mother   . CVA Mother   . Dementia Mother   . Coronary artery disease Father 67  . Heart disease Father   . Stroke Father   . Dementia Sister   . Cancer - Other Brother   . Hypertension Other   . Hyperlipidemia Other   . Colon polyps Sister   . Cancer Sister    Allergies as of 07/10/2020      Reactions   Altace [ramipril] Cough   Codeine    Makes sick   Tape    Blisters   Zantac [ranitidine Hcl] Dermatitis      Medication List       Accurate as of July 10, 2020 11:41 AM. If you  have any questions, ask your nurse or doctor.        STOP taking these medications   tamsulosin 0.4 MG Caps capsule Commonly known as: FLOMAX Stopped by: Howard Pouch, DO     TAKE these medications   aspirin EC 81 MG tablet Take 81 mg by mouth daily.   atorvastatin 80 MG tablet Commonly known as: LIPITOR Take 1 tablet (80 mg total) by mouth daily.   buPROPion 300 MG 24 hr tablet Commonly known as: WELLBUTRIN XL Take 1 tablet (300 mg total) by mouth daily.  clopidogrel 75 MG tablet Commonly known as: PLAVIX Take 1 tablet (75 mg total) by mouth daily. Please schedule yearly follow up visit for future refills [phone]   diclofenac sodium 1 % Gel Commonly known as: VOLTAREN Apply 4 g topically 4 (four) times daily. To affected joint.   erythromycin ophthalmic ointment Place 1 application into the left eye at bedtime for 5 days. Started by: Howard Pouch, DO   famotidine 20 MG tablet Commonly known as: Pepcid Take 1 tablet (20 mg total) by mouth 2 (two) times daily.   iron polysaccharides 150 MG capsule Commonly known as: NIFEREX Take 1 capsule (150 mg total) by mouth daily.   metFORMIN 1000 MG tablet Commonly known as: GLUCOPHAGE Take 1 tablet (1,000 mg total) by mouth 2 (two) times daily with a meal. diabetes What changed: when to take this Changed by: Howard Pouch, DO   metoprolol tartrate 50 MG tablet Commonly known as: LOPRESSOR Take 1 tablet (50 mg total) by mouth daily.   multivitamin tablet Take 1 tablet by mouth daily.   nitroGLYCERIN 0.4 MG SL tablet Commonly known as: NITROSTAT Place 1 tablet (0.4 mg total) under the tongue every 5 (five) minutes as needed for chest pain (3 doses max).   pantoprazole 40 MG tablet Commonly known as: PROTONIX Take 1 tablet (40 mg total) by mouth 2 (two) times daily.   PARoxetine 40 MG tablet Commonly known as: PAXIL Take 1 tablet (40 mg total) by mouth daily. What changed:   medication strength  how much  to take Changed by: Howard Pouch, DO   Symbicort 160-4.5 MCG/ACT inhaler Generic drug: budesonide-formoterol Inhale 2 puffs into the lungs 2 (two) times daily.   traZODone 50 MG tablet Commonly known as: DESYREL Take 3 tablets (150 mg total) by mouth at bedtime.   VITAMIN B 12 PO Take by mouth.       Results for orders placed or performed in visit on 07/10/20 (from the past 24 hour(s))  POCT HgB A1C     Status: Abnormal   Collection Time: 07/10/20 11:03 AM  Result Value Ref Range   Hemoglobin A1C 7.1 (A) 4.0 - 5.6 %   HbA1c POC (<> result, manual entry)     HbA1c, POC (prediabetic range)     HbA1c, POC (controlled diabetic range)     No results found.   ROS: Negative, with the exception of above mentioned in HPI   Objective:  BP 131/81 Comment: No taken bp meds today  Pulse 65   Temp 98 F (36.7 C)   Resp 20   Ht 5\' 10"  (1.778 m)   Wt 208 lb (94.3 kg)   SpO2 96%   BMI 29.84 kg/m  Body mass index is 29.84 kg/m. Gen: Afebrile. No acute distress. Pleasant male.  HENT: AT. [place]. No cough or shortness of breath.  Eyes: mild redness and swelling left upper mid eyelid. Pupils Equal Round Reactive to light, Extraocular movements intact,  Conjunctiva without redness, discharge or icterus. Neck/lymp/endocrine: Supple,n lymphadenopathy, no thyromegaly CV: RRR no murmur, no edema, +2/4 P posterior tibialis pulses Chest: CTAB, no wheeze or crackles Abd: Soft. NTND. BS present. no Masses palpated.  Skin: no rashes, purpura or petechiae.  Neuro: Normal gait. PERLA. EOMi. Alert. Oriented x3  Psych: Normal affect, dress and demeanor. Normal speech. Normal thought content and judgment.    Results for orders placed or performed in visit on 07/10/20 (from the past 48 hour(s))  POCT HgB A1C  Status: Abnormal   Collection Time: 07/10/20 11:03 AM  Result Value Ref Range   Hemoglobin A1C 7.1 (A) 4.0 - 5.6 %   HbA1c POC (<> result, manual entry)     HbA1c, POC (prediabetic  range)     HbA1c, POC (controlled diabetic range)      Assessment/Plan: [name] is a 74 y.o. male present for OV for  Recurrent major depressive disorder, in partial remission ([place]) - stable.  - increase paxil to 40 mg- tolerating> could use additional coverage.  - DC'd  Remeron 30 mg QHS last visit secondary to reports of memory decline- which seemed to potentially start around remeron.  - continue  trazodone 2-3 tabs QHS PRN - continue wellbutrin 300 QD.  - Follow-up 5 months  Gastroesophageal reflux disease, esophagitis presence not specified -stable.  -continue  Protonix 40 mg twice a day, was unable to take back secondary to recurrence of symptoms. -continue  Pepcid twice daily today - B 12, vitamin D and magnesium levels collected today.   Chronic bronchitis, unspecified chronic bronchitis type ([place]) -stable -  Continue Symbicort.   Atherosclerosis of native coronary artery of native heart without angina pectoris/Essential hypertension/HYPERCHOLESTEROLEMIA/History of MI (myocardial infarction) Stage 3 chronic kidney disease/ iron deficiency anemia - stable.  - following with cards, Dr. Johnsie Cancel. -Continue statin -Continue metoprolol - ASA and plavix managed by cardio - low sodium diet and exercise encouraged.   Type 2 diabetes mellitus without complication, without long-term current use of insulin (HCC) - A1c today 5.8--> 6.0-->6.1>>6.7> 6.5> 7.1 >7.1 today.  -Increased metformin to 1000 BID - Foot exam: 03/10/2020 - eye exam: 03/30/2019 - PNA: Series completed - Flu: UTD 2020 , encouraged yearly - Urine Microalbumin w/creat. Ratio: completed 03/10/2020 Last A1c 6.5> 7.1>7.1 today  Iron deficiency anemia: Iron panel UTD Currently on supplementation  Memory deficits: Still present- mildly improved since DC remeron. He has been referred to neuro.   Stye: Improving since last week.  Warm compresses.  Erythromycin OP QHS.   F/u [facility] 4 mos.    Reviewed  expectations re: course of current medical issues.  Discussed self-management of symptoms.  Outlined signs and symptoms indicating need for more acute intervention.  Patient verbalized understanding and all questions were answered.  Patient received an After-Visit Summary.     Orders Placed This Encounter  Procedures  . Ambulatory referral to Urology  . POCT HgB A1C   Meds ordered this encounter  Medications  . metoprolol tartrate (LOPRESSOR) 50 MG tablet    Sig: Take 1 tablet (50 mg total) by mouth daily.    Dispense:  90 tablet    Refill:  1  . clopidogrel (PLAVIX) 75 MG tablet    Sig: Take 1 tablet (75 mg total) by mouth daily. Please schedule yearly follow up visit for future refills [phone]    Dispense:  90 tablet    Refill:  3  . buPROPion (WELLBUTRIN XL) 300 MG 24 hr tablet    Sig: Take 1 tablet (300 mg total) by mouth daily.    Dispense:  90 tablet    Refill:  1  . atorvastatin (LIPITOR) 80 MG tablet    Sig: Take 1 tablet (80 mg total) by mouth daily.    Dispense:  90 tablet    Refill:  3  . traZODone (DESYREL) 50 MG tablet    Sig: Take 3 tablets (150 mg total) by mouth at bedtime.    Dispense:  270 tablet  Refill:  1  . PARoxetine (PAXIL) 40 MG tablet    Sig: Take 1 tablet (40 mg total) by mouth daily.    Dispense:  90 tablet    Refill:  1    DC prior doses. Remind him to only take one pill now. Thanks.  . metFORMIN (GLUCOPHAGE) 1000 MG tablet    Sig: Take 1 tablet (1,000 mg total) by mouth 2 (two) times daily with a meal. diabetes    Dispense:  180 tablet    Refill:  1    Change in dose  . erythromycin ophthalmic ointment    Sig: Place 1 application into the left eye at bedtime for 5 days.    Dispense:  3.5 g    Refill:  0  . SYMBICORT 160-4.5 MCG/ACT inhaler    Sig: Inhale 2 puffs into the lungs 2 (two) times daily.    Dispense:  3 each    Refill:  4     electronically signed by:  Howard Pouch, DO  [place]

## 2020-07-12 ENCOUNTER — Telehealth: Payer: Self-pay | Admitting: Family Medicine

## 2020-07-12 NOTE — Telephone Encounter (Signed)
Spoke with patient spouse she will give him a message to call back

## 2020-07-13 DIAGNOSIS — L570 Actinic keratosis: Secondary | ICD-10-CM | POA: Diagnosis not present

## 2020-07-13 DIAGNOSIS — Z85828 Personal history of other malignant neoplasm of skin: Secondary | ICD-10-CM | POA: Diagnosis not present

## 2020-07-13 DIAGNOSIS — L821 Other seborrheic keratosis: Secondary | ICD-10-CM | POA: Diagnosis not present

## 2020-08-16 ENCOUNTER — Encounter: Payer: Self-pay | Admitting: Neurology

## 2020-08-16 ENCOUNTER — Ambulatory Visit: Payer: Medicare HMO | Admitting: Neurology

## 2020-08-16 VITALS — BP 139/80 | HR 62 | Ht 70.0 in | Wt 199.5 lb

## 2020-08-16 DIAGNOSIS — J449 Chronic obstructive pulmonary disease, unspecified: Secondary | ICD-10-CM | POA: Diagnosis not present

## 2020-08-16 DIAGNOSIS — R4189 Other symptoms and signs involving cognitive functions and awareness: Secondary | ICD-10-CM | POA: Insufficient documentation

## 2020-08-16 DIAGNOSIS — G3184 Mild cognitive impairment, so stated: Secondary | ICD-10-CM

## 2020-08-16 DIAGNOSIS — E1142 Type 2 diabetes mellitus with diabetic polyneuropathy: Secondary | ICD-10-CM | POA: Insufficient documentation

## 2020-08-16 DIAGNOSIS — R42 Dizziness and giddiness: Secondary | ICD-10-CM | POA: Diagnosis not present

## 2020-08-16 NOTE — Progress Notes (Signed)
Chief Complaint  Patient presents with  . New Patient (Initial Visit)    MMSE 29/30 - 10 animals. He is here to discuss his progressive memory decline. He would like to review the results of his recent MRI brain.   [name] PCP    [name], DO    HISTORICAL  [name] is a 74 year old male, seen in request by his primary care doctor Howard Pouch A, for evaluation of memory loss, initial evaluation was on August 16, 2020  I reviewed and summarized the referring note.  Past medical history Hyperlipidemia Hypertension Diabetes Depression anxiety, chronic insomnia, polypharmacy treatment, trazodone 150 mg at bedtime, Paxil 40 mg daily, Wellbutrin XL 300 mg daily, reported suboptimal control of his depression, complains of marital stress, frequent awakening at nighttime due to his bladder issues, for 5 times each night to use bathroom  He is a retired Furniture conservator/restorer at age 50, noticed gradual onset of memory loss since 2020, tends to repeat himself, forget conversations, work on his yard some, spends a lot of time sitting still, he drinks few cups of coffee, diet Pepsi, use bathroom frequently, he also complains of intermittent dizziness, especially with sudden positional change, such as getting up quickly after prolonged sitting, he also has bilateral foot numbness, denies fingertips paresthesia  Since 2018, he has given up his driving, wife drove him to clinic today, but was not with him during today's interview, he mentioned that his wife seems to be frustrated by his gradual memory loss, he tends to forget the conversation, he denies a family history of memory loss  Personally reviewed MRI of the brain in June 2021, moderate generalized atrophy, mild supratentorium small vessel disease.  Laboratory evaluation in 2021 showed normal TSH, Z61, folic acid, CBC, CMP, W9U was 7.1  REVIEW OF SYSTEMS: Full 14 system review of systems performed and notable only for above All other review of  systems were negative.  ALLERGIES: Allergies  Allergen Reactions  . Altace [Ramipril] Cough  . Codeine     Makes sick  . Tape     Blisters   . Zantac [Ranitidine Hcl] Dermatitis    HOME MEDICATIONS: Current Outpatient Medications  Medication Sig Dispense Refill  . aspirin EC 81 MG tablet Take 81 mg by mouth daily.     [name] atorvastatin (LIPITOR) 80 MG tablet Take 1 tablet (80 mg total) by mouth daily. 90 tablet 3  . buPROPion (WELLBUTRIN XL) 300 MG 24 hr tablet Take 1 tablet (300 mg total) by mouth daily. 90 tablet 1  . clopidogrel (PLAVIX) 75 MG tablet Take 1 tablet (75 mg total) by mouth daily. Please schedule yearly follow up visit for future refills [phone] 90 tablet 3  . Cyanocobalamin (VITAMIN B 12 PO) Take by mouth.    . diclofenac sodium (VOLTAREN) 1 % GEL Apply 4 g topically 4 (four) times daily. To affected joint. 100 g 11  . famotidine (PEPCID) 20 MG tablet Take 1 tablet (20 mg total) by mouth 2 (two) times daily. 180 tablet 3  . iron polysaccharides (NIFEREX) 150 MG capsule Take 1 capsule (150 mg total) by mouth daily. 30 capsule 2  . metFORMIN (GLUCOPHAGE) 1000 MG tablet Take 1 tablet (1,000 mg total) by mouth 2 (two) times daily with a meal. diabetes 180 tablet 1  . metoprolol tartrate (LOPRESSOR) 50 MG tablet Take 1 tablet (50 mg total) by mouth daily. 90 tablet 1  . Multiple Vitamin (MULTIVITAMIN) tablet Take 1 tablet by mouth  daily.      . nitroGLYCERIN (NITROSTAT) 0.4 MG SL tablet Place 1 tablet (0.4 mg total) under the tongue every 5 (five) minutes as needed for chest pain (3 doses max). 25 tablet 3  . pantoprazole (PROTONIX) 40 MG tablet Take 1 tablet (40 mg total) by mouth 2 (two) times daily. 180 tablet 3  . PARoxetine (PAXIL) 40 MG tablet Take 1 tablet (40 mg total) by mouth daily. 90 tablet 1  . SYMBICORT 160-4.5 MCG/ACT inhaler Inhale 2 puffs into the lungs 2 (two) times daily. 3 each 4  . traZODone (DESYREL) 50 MG tablet Take 3 tablets (150 mg total) by  mouth at bedtime. 270 tablet 1   No current facility-administered medications for this visit.    PAST MEDICAL HISTORY: Past Medical History:  Diagnosis Date  . Achilles tendinitis of left lower extremity 07/20/2018  . Adenomatous colon polyp 2011  . Barrett esophagus 12/2013   EGD  . CAD (coronary artery disease)    ETT/Lexiscan-Myoview (11/14):  Normal; no ischemia, EF 67%  . CKD (chronic kidney disease), stage III   . Colon polyp 09/2010   adenoma  . COPD (chronic obstructive pulmonary disease) ([place]) 2009   spirometry with "moderate COPD"  . Depression   . Diabetes mellitus without complication ([place])   . Dyspnea   . GERD (gastroesophageal reflux disease)   . GI bleed    after polypectomy/admitted  . Hair loss 08/07/2018  . HTN (hypertension)   . Hypercholesterolemia   . Memory loss   . Myocardial infarction ([place])   . Obesity   . Sleep apnea   . Spontaneous pneumothorax   . Synovial cyst of right popliteal space 07/20/2018  . Vertigo     PAST SURGICAL HISTORY: Past Surgical History:  Procedure Laterality Date  . APPENDECTOMY    . PLEURAL SCARIFICATION  1987  . TONSILLECTOMY    . VARICOCELECTOMY      FAMILY HISTORY: Family History  Problem Relation Age of Onset  . Heart disease Mother   . CVA Mother   . Dementia Mother   . Coronary artery disease Father 11  . Heart disease Father   . Stroke Father   . Dementia Sister   . Cancer - Other Brother   . Hypertension Other   . Hyperlipidemia Other   . Colon polyps Sister   . Cancer Sister     SOCIAL HISTORY: Social History   Socioeconomic History  . Marital status: Married    Spouse name: Not on file  . Number of children: 2  . Years of education: come college  . Highest education level: Not on file  Occupational History  . Occupation: retired Furniture conservator/restorer  Tobacco Use  . Smoking status: Former Smoker    Types: Cigarettes    Quit date: 11/25/1998    Years since quitting: 21.7  . Smokeless tobacco: Never  Used  Vaping Use  . Vaping Use: Never used  Substance and Sexual Activity  . Alcohol use: No  . Drug use: No  . Sexual activity: Not Currently  Other Topics Concern  . Not on file  Social History Narrative   Lives at home with wife. Married to [name].   Retired Production designer, theatre/television/film truck.    Drinks caffeine (3 cups coffee, 2 sodas per day),  Takes a daily vitamin, wears a seatbelt   Wears a bicycle helmet.    Has dentures (partial on top)   Smoke detector in the home. No firearms in the home.  Feels safe in relationships.     Right-handed.   Social Determinants of Health   Financial Resource Strain:   . Difficulty of Paying Living Expenses: Not on file  Food Insecurity:   . Worried About Charity fundraiser in the Last Year: Not on file  . Ran Out of Food in the Last Year: Not on file  Transportation Needs:   . Lack of Transportation (Medical): Not on file  . Lack of Transportation (Non-Medical): Not on file  Physical Activity:   . Days of Exercise per Week: Not on file  . Minutes of Exercise per Session: Not on file  Stress:   . Feeling of Stress : Not on file  Social Connections:   . Frequency of Communication with Friends and Family: Not on file  . Frequency of Social Gatherings with Friends and Family: Not on file  . Attends Religious Services: Not on file  . Active Member of Clubs or Organizations: Not on file  . Attends Archivist Meetings: Not on file  . Marital Status: Not on file  Intimate Partner Violence:   . Fear of Current or Ex-Partner: Not on file  . Emotionally Abused: Not on file  . Physically Abused: Not on file  . Sexually Abused: Not on file     PHYSICAL EXAM   Vitals:   08/16/20 1114  BP: 139/80  Pulse: 62  Weight: 199 lb 8 oz (90.5 kg)  Height: 5\' 10"  (1.778 m)   Not recorded    Sitting down blood pressure 135/76, heart rate of 55, standing up 135/79, heart rate of 64, he reported mild dizziness, but quickly disappeared   Body mass  index is 28.63 kg/m.  PHYSICAL EXAMNIATION:  Gen: NAD, conversant, well nourised, well groomed                     Cardiovascular: Regular rate rhythm, no peripheral edema, warm, nontender. Eyes: Conjunctivae clear without exudates or hemorrhage Neck: Supple, no carotid bruits. Pulmonary: Clear to auscultation bilaterally   NEUROLOGICAL EXAM:  MENTAL STATUS: MMSE - Mini Mental State Exam 08/16/2020 06/29/2019 06/22/2018  Orientation to time 5 5 5   Orientation to Place 5 5 5   Registration 3 3 3   Attention/ Calculation 5 5 5   Recall 2 2 0  Language- name 2 objects 2 2 2   Language- repeat 1 1 1   Language- follow 3 step command 3 3 3   Language- read & follow direction 1 1 1   Write a sentence 1 1 1   Copy design 1 1 1   Total score 29 29 27   Animal naming 10   CRANIAL NERVES: CN II: Visual fields are full to confrontation. Pupils are round equal and briskly reactive to light. CN III, IV, VI: extraocular movement are normal. No ptosis. CN V: Facial sensation is intact to light touch CN VII: Face is symmetric with normal eye closure  CN VIII: Hearing is normal to causal conversation. CN IX, X: Phonation is normal. CN XI: Head turning and shoulder shrug are intact  MOTOR: There is no pronator drift of out-stretched arms. Muscle bulk and tone are normal. Muscle strength is normal.  REFLEXES: Reflexes are 2+ and symmetric at the biceps, triceps, knees, and trace at ankles. Plantar responses are flexor.  SENSORY: Mildly length dependent decreased to light touch, pinprick and vibratory sensation to mid shin level  COORDINATION: There is no trunk or limb dysmetria noted.  GAIT/STANCE: He can get up from seated  position, arms crossed, steady Romberg is absent.   DIAGNOSTIC DATA (LABS, IMAGING, TESTING) - I reviewed patient records, labs, notes, testing and imaging myself where available.   ASSESSMENT AND PLAN  KOREY PRASHAD is a 74 y.o. male   Mild cognitive  impairment  Mini-Mental Status Examination 29/30,  MRI of the brain showed moderate generalized atrophy, mild supratentorium small vessel disease  Most likely central nervous system degenerative disorder, versus aging process, worsened by his chronic insomnia, polypharmacy treatment, suboptimal control of his depression  Emphasized importance of moderate exercise,   Peripheral neuropathy, orthostatic dizziness,  Likely related to the orthostatic blood pressure changes, he does have long history of diabetes,  Length dependent sensory changes on examination  EMG nerve conduction study  Laboratory evaluation to rule out other etiology for peripheral neuropathy  Emphasized importance of water intake, decreased caffeine-containing beverage  Ultrasound of  carotid artery   Marcial Pacas, M.D. Ph.D.  [facility] [address] [place], [place] [address] Ph: [phone] Fax: [phone]  CC:  [name], DO [address] [place],  [place] [address]

## 2020-08-23 LAB — MICROSCOPIC EXAMINATION
Bacteria, UA: NONE SEEN
Casts: NONE SEEN /lpf
Epithelial Cells (non renal): NONE SEEN /hpf (ref 0–10)
RBC, Urine: NONE SEEN /hpf (ref 0–2)
WBC, UA: NONE SEEN /hpf (ref 0–5)

## 2020-08-23 LAB — MULTIPLE MYELOMA PANEL, SERUM
Albumin SerPl Elph-Mcnc: 3.9 g/dL (ref 2.9–4.4)
Albumin/Glob SerPl: 1.4 (ref 0.7–1.7)
Alpha 1: 0.2 g/dL (ref 0.0–0.4)
Alpha2 Glob SerPl Elph-Mcnc: 0.9 g/dL (ref 0.4–1.0)
B-Globulin SerPl Elph-Mcnc: 1 g/dL (ref 0.7–1.3)
Gamma Glob SerPl Elph-Mcnc: 0.8 g/dL (ref 0.4–1.8)
Globulin, Total: 2.9 g/dL (ref 2.2–3.9)
IgA/Immunoglobulin A, Serum: 203 mg/dL (ref 61–437)
IgG (Immunoglobin G), Serum: 785 mg/dL (ref 603–1613)
IgM (Immunoglobulin M), Srm: 96 mg/dL (ref 15–143)
Total Protein: 6.8 g/dL (ref 6.0–8.5)

## 2020-08-23 LAB — FERRITIN: Ferritin: 111 ng/mL (ref 30–400)

## 2020-08-23 LAB — URINALYSIS, ROUTINE W REFLEX MICROSCOPIC
Bilirubin, UA: NEGATIVE
Glucose, UA: NEGATIVE
Ketones, UA: NEGATIVE
Nitrite, UA: NEGATIVE
Protein,UA: NEGATIVE
RBC, UA: NEGATIVE
Specific Gravity, UA: 1.014 (ref 1.005–1.030)
Urobilinogen, Ur: 0.2 mg/dL (ref 0.2–1.0)
pH, UA: 6 (ref 5.0–7.5)

## 2020-08-23 LAB — CK: Total CK: 53 U/L (ref 41–331)

## 2020-08-23 LAB — SEDIMENTATION RATE: Sed Rate: 8 mm/hr (ref 0–30)

## 2020-08-23 LAB — RPR: RPR Ser Ql: NONREACTIVE

## 2020-08-23 LAB — COPPER, SERUM: Copper: 117 ug/dL (ref 69–132)

## 2020-08-23 LAB — C-REACTIVE PROTEIN: CRP: 2 mg/L (ref 0–10)

## 2020-08-24 DIAGNOSIS — N401 Enlarged prostate with lower urinary tract symptoms: Secondary | ICD-10-CM | POA: Diagnosis not present

## 2020-08-24 DIAGNOSIS — R3915 Urgency of urination: Secondary | ICD-10-CM | POA: Diagnosis not present

## 2020-08-24 DIAGNOSIS — R350 Frequency of micturition: Secondary | ICD-10-CM | POA: Diagnosis not present

## 2020-08-30 ENCOUNTER — Other Ambulatory Visit: Payer: Self-pay | Admitting: Neurology

## 2020-08-30 DIAGNOSIS — R42 Dizziness and giddiness: Secondary | ICD-10-CM

## 2020-08-30 NOTE — Progress Notes (Signed)
Orders Placed This Encounter  Procedures  . VAS US CAROTID

## 2020-09-12 ENCOUNTER — Telehealth: Payer: Self-pay | Admitting: Neurology

## 2020-09-12 ENCOUNTER — Ambulatory Visit (HOSPITAL_COMMUNITY)
Admission: RE | Admit: 2020-09-12 | Discharge: 2020-09-12 | Disposition: A | Discharge status: Home / Self Care | Payer: Medicare HMO | Source: Ambulatory Visit | Attending: Neurology | Admitting: Neurology

## 2020-09-12 ENCOUNTER — Other Ambulatory Visit: Payer: Self-pay

## 2020-09-12 DIAGNOSIS — R42 Dizziness and giddiness: Secondary | ICD-10-CM | POA: Insufficient documentation

## 2020-09-12 NOTE — Telephone Encounter (Signed)
I spoke to the patient and provided him with his test results. He verbalized understanding and will continue taking aspirin 81mg  daily.

## 2020-09-12 NOTE — Telephone Encounter (Signed)
Summary: Right Carotid: Velocities in the right ICA are consistent with a 1-39% stenosis.  Left Carotid: Velocities in the left ICA are consistent with a 1-39% stenosis.  Vertebrals: Bilateral vertebral arteries demonstrate antegrade flow   Please call patient, ultrasound of carotid artery showed less than 39% stenosis, he should continue aspirin 81 mg daily

## 2020-09-12 NOTE — Progress Notes (Signed)
Carotid duplex has been completed.   Preliminary results in CV Proc.   Abram Sander 09/12/2020 1:07 PM

## 2020-09-28 DIAGNOSIS — R350 Frequency of micturition: Secondary | ICD-10-CM | POA: Diagnosis not present

## 2020-09-28 DIAGNOSIS — N401 Enlarged prostate with lower urinary tract symptoms: Secondary | ICD-10-CM | POA: Diagnosis not present

## 2020-09-28 DIAGNOSIS — R972 Elevated prostate specific antigen [PSA]: Secondary | ICD-10-CM | POA: Diagnosis not present

## 2020-10-02 ENCOUNTER — Ambulatory Visit (INDEPENDENT_AMBULATORY_CARE_PROVIDER_SITE_OTHER): Payer: Medicare HMO | Admitting: Neurology

## 2020-10-02 ENCOUNTER — Telehealth: Payer: Self-pay | Admitting: *Deleted

## 2020-10-02 DIAGNOSIS — R42 Dizziness and giddiness: Secondary | ICD-10-CM | POA: Diagnosis not present

## 2020-10-02 DIAGNOSIS — G3184 Mild cognitive impairment, so stated: Secondary | ICD-10-CM

## 2020-10-02 DIAGNOSIS — R208 Other disturbances of skin sensation: Secondary | ICD-10-CM

## 2020-10-02 DIAGNOSIS — E1142 Type 2 diabetes mellitus with diabetic polyneuropathy: Secondary | ICD-10-CM

## 2020-10-02 NOTE — Telephone Encounter (Addendum)
Pt arrived late to his appointment and was able to be seen.

## 2020-10-02 NOTE — Progress Notes (Signed)
No chief complaint on file.   HISTORICAL  Todd [name] is [name] 74 year old male, seen in request by his primary care doctor Todd [name], for evaluation of memory loss, initial evaluation was on August 16, 2020  I reviewed and summarized the referring note.  Past medical history Hyperlipidemia Hypertension Diabetes Depression anxiety, chronic insomnia, polypharmacy treatment, trazodone 150 mg at bedtime, Paxil 40 mg daily, Wellbutrin XL 300 mg daily, reported suboptimal control of his depression, complains of marital stress, frequent awakening at nighttime due to his bladder issues, for 5 times each night to use bathroom  He is [name] retired Furniture conservator/restorer at age 25, noticed gradual onset of memory loss since 2020, tends to repeat himself, forget conversations, work on his yard some, spends [name] lot of time sitting still, he drinks few cups of coffee, diet Pepsi, use bathroom frequently, he also complains of intermittent dizziness, especially with sudden positional change, such as getting up quickly after prolonged sitting, he also has bilateral foot numbness, denies fingertips paresthesia  Since 2018, he has given up his driving, wife drove him to clinic today, but was not with him during today's interview, he mentioned that his wife seems to be frustrated by his gradual memory loss, he tends to forget the conversation, he denies [name] family history of memory loss  Personally reviewed MRI of the brain in June 2021, moderate generalized atrophy, mild supratentorium small vessel disease.  Laboratory evaluation in 2021 showed normal TSH, N27, folic acid, CBC, CMP, P8E was 7.1  Update October 02, 2020: We again personally reviewed MRI of the brain without contrast, generalized atrophy mild to moderate supratentorium small vessel disease  EMG nerve conduction study today October 02, 2020 showed mild length dependent sensorimotor polyneuropathy,  Reviewed extensive laboratory evaluations: Normal or  negative protein electrophoresis, ESR, C-reactive protein, ferritin, copper, CPK, RPR, A1c was elevated at 7.1, glucose was 160 on BMP  REVIEW OF SYSTEMS: Full 14 system review of systems performed and notable only for above All other review of systems were negative.  ALLERGIES: Allergies  Allergen Reactions  . Altace [Ramipril] Cough  . Codeine     Makes sick  . Tape     Blisters   . Zantac [Ranitidine Hcl] Dermatitis    HOME MEDICATIONS: Current Outpatient Medications  Medication Sig Dispense Refill  . aspirin EC 81 MG tablet Take 81 mg by mouth daily.     Marland Kitchen atorvastatin (LIPITOR) 80 MG tablet Take 1 tablet (80 mg total) by mouth daily. 90 tablet 3  . buPROPion (WELLBUTRIN XL) 300 MG 24 hr tablet Take 1 tablet (300 mg total) by mouth daily. 90 tablet 1  . clopidogrel (PLAVIX) 75 MG tablet Take 1 tablet (75 mg total) by mouth daily. Please schedule yearly follow up visit for future refills [phone] 90 tablet 3  . Cyanocobalamin (VITAMIN B 12 PO) Take by mouth.    . diclofenac sodium (VOLTAREN) 1 % GEL Apply 4 g topically 4 (four) times daily. To affected joint. 100 g 11  . famotidine (PEPCID) 20 MG tablet Take 1 tablet (20 mg total) by mouth 2 (two) times daily. 180 tablet 3  . iron polysaccharides (NIFEREX) 150 MG capsule Take 1 capsule (150 mg total) by mouth daily. 30 capsule 2  . metFORMIN (GLUCOPHAGE) 1000 MG tablet Take 1 tablet (1,000 mg total) by mouth 2 (two) times daily with [name] meal. diabetes 180 tablet 1  . metoprolol tartrate (LOPRESSOR) 50 MG tablet Take 1 tablet (50 mg  total) by mouth daily. 90 tablet 1  . Multiple Vitamin (MULTIVITAMIN) tablet Take 1 tablet by mouth daily.      . nitroGLYCERIN (NITROSTAT) 0.4 MG SL tablet Place 1 tablet (0.4 mg total) under the tongue every 5 (five) minutes as needed for chest pain (3 doses max). 25 tablet 3  . pantoprazole (PROTONIX) 40 MG tablet Take 1 tablet (40 mg total) by mouth 2 (two) times daily. 180 tablet 3  . PARoxetine  (PAXIL) 40 MG tablet Take 1 tablet (40 mg total) by mouth daily. 90 tablet 1  . SYMBICORT 160-4.5 MCG/ACT inhaler Inhale 2 puffs into the lungs 2 (two) times daily. 3 each 4  . traZODone (DESYREL) 50 MG tablet Take 3 tablets (150 mg total) by mouth at bedtime. 270 tablet 1   No current facility-administered medications for this visit.    PAST MEDICAL HISTORY: Past Medical History:  Diagnosis Date  . Achilles tendinitis of left lower extremity 07/20/2018  . Adenomatous colon polyp 2011  . Barrett esophagus 12/2013   EGD  . CAD (coronary artery disease)    ETT/Lexiscan-Myoview (11/14):  Normal; no ischemia, EF 67%  . CKD (chronic kidney disease), stage III   . Colon polyp 09/2010   adenoma  . COPD (chronic obstructive pulmonary disease) ([place]) 2009   spirometry with "moderate COPD"  . Depression   . Diabetes mellitus without complication ([place])   . Dyspnea   . GERD (gastroesophageal reflux disease)   . GI bleed    after polypectomy/admitted  . Hair loss 08/07/2018  . HTN (hypertension)   . Hypercholesterolemia   . Memory loss   . Myocardial infarction ([place])   . Obesity   . Sleep apnea   . Spontaneous pneumothorax   . Synovial cyst of right popliteal space 07/20/2018  . Vertigo     PAST SURGICAL HISTORY: Past Surgical History:  Procedure Laterality Date  . APPENDECTOMY    . PLEURAL SCARIFICATION  1987  . TONSILLECTOMY    . VARICOCELECTOMY      FAMILY HISTORY: Family History  Problem Relation Age of Onset  . Heart disease Mother   . CVA Mother   . Dementia Mother   . Coronary artery disease Father 67  . Heart disease Father   . Stroke Father   . Dementia Sister   . Cancer - Other Brother   . Hypertension Other   . Hyperlipidemia Other   . Colon polyps Sister   . Cancer Sister     SOCIAL HISTORY: Social History   Socioeconomic History  . Marital status: Married    Spouse name: Not on file  . Number of children: 2  . Years of education: come college  .  Highest education level: Not on file  Occupational History  . Occupation: retired Furniture conservator/restorer  Tobacco Use  . Smoking status: Former Smoker    Types: Cigarettes    Quit date: 11/25/1998    Years since quitting: 21.8  . Smokeless tobacco: Never Used  Vaping Use  . Vaping Use: Never used  Substance and Sexual Activity  . Alcohol use: No  . Drug use: No  . Sexual activity: Not Currently  Other Topics Concern  . Not on file  Social History Narrative   Lives at home with wife. Married to [name].   Retired Production designer, theatre/television/film truck.    Drinks caffeine (3 cups coffee, 2 sodas per day),  Takes [name] daily vitamin, wears [name] seatbelt   Wears [name] bicycle helmet.  Has dentures (partial on top)   Smoke detector in the home. No firearms in the home.    Feels safe in relationships.     Right-handed.   Social Determinants of Health   Financial Resource Strain:   . Difficulty of Paying Living Expenses: Not on file  Food Insecurity:   . Worried About Charity fundraiser in the Last Year: Not on file  . Ran Out of Food in the Last Year: Not on file  Transportation Needs:   . Lack of Transportation (Medical): Not on file  . Lack of Transportation (Non-Medical): Not on file  Physical Activity:   . Days of Exercise per Week: Not on file  . Minutes of Exercise per Session: Not on file  Stress:   . Feeling of Stress : Not on file  Social Connections:   . Frequency of Communication with Friends and Family: Not on file  . Frequency of Social Gatherings with Friends and Family: Not on file  . Attends Religious Services: Not on file  . Active Member of Clubs or Organizations: Not on file  . Attends Archivist Meetings: Not on file  . Marital Status: Not on file  Intimate Partner Violence:   . Fear of Current or Ex-Partner: Not on file  . Emotionally Abused: Not on file  . Physically Abused: Not on file  . Sexually Abused: Not on file     PHYSICAL EXAM   There were no vitals filed for this  visit. Not recorded    Sitting down blood pressure 135/76, heart rate of 55, standing up 135/79, heart rate of 64, he reported mild dizziness, but quickly disappeared   There is no height or weight on file to calculate BMI.  PHYSICAL EXAMNIATION:  Gen: NAD, conversant, well nourised, well groomed                   NEUROLOGICAL EXAM:  MENTAL STATUS: MMSE - Mini Mental State Exam 08/16/2020 06/29/2019 06/22/2018  Orientation to time 5 5 5   Orientation to Place 5 5 5   Registration 3 3 3   Attention/ Calculation 5 5 5   Recall 2 2 0  Language- name 2 objects 2 2 2   Language- repeat 1 1 1   Language- follow 3 step command 3 3 3   Language- read & follow direction 1 1 1   Write [name] sentence 1 1 1   Copy design 1 1 1   Total score 29 29 27   Animal naming 10   CRANIAL NERVES: CN II: Visual fields are full to confrontation. Pupils are round equal and briskly reactive to light. CN III, IV, VI: extraocular movement are normal. No ptosis. CN V: Facial sensation is intact to light touch CN VII: Face is symmetric with normal eye closure  CN VIII: Hearing is normal to causal conversation. CN IX, X: Phonation is normal. CN XI: Head turning and shoulder shrug are intact  MOTOR: There is no pronator drift of out-stretched arms. Muscle bulk and tone are normal. Muscle strength is normal.  REFLEXES: Reflexes are 2+ and symmetric at the biceps, triceps, knees, and trace at ankles. Plantar responses are flexor.  SENSORY: Mildly length dependent decreased to light touch, pinprick and vibratory sensation to mid shin level  COORDINATION: There is no trunk or limb dysmetria noted.  GAIT/STANCE: He can get up from seated position, arms crossed, steady Romberg is absent.   DIAGNOSTIC DATA (LABS, IMAGING, TESTING) - I reviewed patient records, labs, notes, testing and imaging  myself where available.   ASSESSMENT AND PLAN  ALPHONSA BRICKLE is [name] 74 y.o. male   Mild cognitive impairment  Mini-Mental  Status Examination 29/30,  MRI of the brain showed moderate generalized atrophy, mild supratentorium small vessel disease  Normal aging process versus early stage of mild central nervous system degenerative disorder, there is also complicated by his chronic insomnia, polypharmacy treatment, suboptimal control of his depression  Emphasized importance of moderate exercise,   He should start aspirin 81 mg daily  Diabetic peripheral neuropathy, orthostatic dizziness,  EMG nerve conduction study confirmed mild axonal sensorimotor polyneuropathy  A1c was 7.1  Laboratory evaluation rule out other treatable cause of peripheral neuropathy  Emphasized importance of tight control of his diabetes, increase water intake, contracted maneuver to avoid orthostatic hypotension  Ultrasound of carotid artery in October 2021 showed less than 39% stenosis bilateral internal carotid artery anterograde flow of vertebral artery  Length dependent sensory changes on examination  EMG nerve conduction study  Laboratory evaluation to rule out other etiology for peripheral neuropathy  Emphasized importance of water intake, decreased caffeine-containing beverage  Ultrasound of  carotid artery   Marcial Pacas, M.D. Ph.D.  [facility] [address] [place], [place] [address] Ph: [phone] Fax: [phone]  CC:  [name], DO [name] [place],  [place] [address]

## 2020-10-02 NOTE — Progress Notes (Signed)
No chief complaint on file.   HISTORICAL  Todd [name] is [name] 74 year old male, seen in request by his primary care doctor Todd [name], for evaluation of memory loss, initial evaluation was on August 16, 2020  I reviewed and summarized the referring note.  Past medical history Hyperlipidemia Hypertension Diabetes Depression anxiety, chronic insomnia, polypharmacy treatment, trazodone 150 mg at bedtime, Paxil 40 mg daily, Wellbutrin XL 300 mg daily, reported suboptimal control of his depression, complains of marital stress, frequent awakening at nighttime due to his bladder issues, for 5 times each night to use bathroom  He is [name] retired Furniture conservator/restorer at age 50, noticed gradual onset of memory loss since 2020, tends to repeat himself, forget conversations, work on his yard some, spends [name] lot of time sitting still, he drinks few cups of coffee, diet Pepsi, use bathroom frequently, he also complains of intermittent dizziness, especially with sudden positional change, such as getting up quickly after prolonged sitting, he also has bilateral foot numbness, denies fingertips paresthesia  Since 2018, he has given up his driving, wife drove him to clinic today, but was not with him during today's interview, he mentioned that his wife seems to be frustrated by his gradual memory loss, he tends to forget the conversation, he denies [name] family history of memory loss  Personally reviewed MRI of the brain in June 2021, moderate generalized atrophy, mild supratentorium small vessel disease.  Laboratory evaluation in 2021 showed normal TSH, G26, folic acid, CBC, CMP, R4W was 7.1  UPDATE Oct 02 2020: Ultrasound of carotid artery showed less than 39% stenosis of bilateral internal carotid artery  Laboratory evaluations in September 2021 showed normal or negative protein electrophoresis, ESR, C-reactive protein, ferritin, copper, CPK, RPR, A1c was mildly elevated 7.1, BMP showed mild elevated glucose  160,   REVIEW OF SYSTEMS: Full 14 system review of systems performed and notable only for above All other review of systems were negative.  ALLERGIES: Allergies  Allergen Reactions  . Altace [Ramipril] Cough  . Codeine     Makes sick  . Tape     Blisters   . Zantac [Ranitidine Hcl] Dermatitis    HOME MEDICATIONS: Current Outpatient Medications  Medication Sig Dispense Refill  . aspirin EC 81 MG tablet Take 81 mg by mouth daily.     Marland Kitchen atorvastatin (LIPITOR) 80 MG tablet Take 1 tablet (80 mg total) by mouth daily. 90 tablet 3  . buPROPion (WELLBUTRIN XL) 300 MG 24 hr tablet Take 1 tablet (300 mg total) by mouth daily. 90 tablet 1  . clopidogrel (PLAVIX) 75 MG tablet Take 1 tablet (75 mg total) by mouth daily. Please schedule yearly follow up visit for future refills [phone] 90 tablet 3  . Cyanocobalamin (VITAMIN B 12 PO) Take by mouth.    . diclofenac sodium (VOLTAREN) 1 % GEL Apply 4 g topically 4 (four) times daily. To affected joint. 100 g 11  . famotidine (PEPCID) 20 MG tablet Take 1 tablet (20 mg total) by mouth 2 (two) times daily. 180 tablet 3  . iron polysaccharides (NIFEREX) 150 MG capsule Take 1 capsule (150 mg total) by mouth daily. 30 capsule 2  . metFORMIN (GLUCOPHAGE) 1000 MG tablet Take 1 tablet (1,000 mg total) by mouth 2 (two) times daily with [name] meal. diabetes 180 tablet 1  . metoprolol tartrate (LOPRESSOR) 50 MG tablet Take 1 tablet (50 mg total) by mouth daily. 90 tablet 1  . Multiple Vitamin (MULTIVITAMIN) tablet Take 1 tablet  by mouth daily.      . nitroGLYCERIN (NITROSTAT) 0.4 MG SL tablet Place 1 tablet (0.4 mg total) under the tongue every 5 (five) minutes as needed for chest pain (3 doses max). 25 tablet 3  . pantoprazole (PROTONIX) 40 MG tablet Take 1 tablet (40 mg total) by mouth 2 (two) times daily. 180 tablet 3  . PARoxetine (PAXIL) 40 MG tablet Take 1 tablet (40 mg total) by mouth daily. 90 tablet 1  . SYMBICORT 160-4.5 MCG/ACT inhaler Inhale 2  puffs into the lungs 2 (two) times daily. 3 each 4  . traZODone (DESYREL) 50 MG tablet Take 3 tablets (150 mg total) by mouth at bedtime. 270 tablet 1   No current facility-administered medications for this visit.    PAST MEDICAL HISTORY: Past Medical History:  Diagnosis Date  . Achilles tendinitis of left lower extremity 07/20/2018  . Adenomatous colon polyp 2011  . Barrett esophagus 12/2013   EGD  . CAD (coronary artery disease)    ETT/Lexiscan-Myoview (11/14):  Normal; no ischemia, EF 67%  . CKD (chronic kidney disease), stage III   . Colon polyp 09/2010   adenoma  . COPD (chronic obstructive pulmonary disease) ([place]) 2009   spirometry with "moderate COPD"  . Depression   . Diabetes mellitus without complication ([place])   . Dyspnea   . GERD (gastroesophageal reflux disease)   . GI bleed    after polypectomy/admitted  . Hair loss 08/07/2018  . HTN (hypertension)   . Hypercholesterolemia   . Memory loss   . Myocardial infarction ([place])   . Obesity   . Sleep apnea   . Spontaneous pneumothorax   . Synovial cyst of right popliteal space 07/20/2018  . Vertigo     PAST SURGICAL HISTORY: Past Surgical History:  Procedure Laterality Date  . APPENDECTOMY    . PLEURAL SCARIFICATION  1987  . TONSILLECTOMY    . VARICOCELECTOMY      FAMILY HISTORY: Family History  Problem Relation Age of Onset  . Heart disease Mother   . CVA Mother   . Dementia Mother   . Coronary artery disease Father 2  . Heart disease Father   . Stroke Father   . Dementia Sister   . Cancer - Other Brother   . Hypertension Other   . Hyperlipidemia Other   . Colon polyps Sister   . Cancer Sister     SOCIAL HISTORY: Social History   Socioeconomic History  . Marital status: Married    Spouse name: Not on file  . Number of children: 2  . Years of education: come college  . Highest education level: Not on file  Occupational History  . Occupation: retired Furniture conservator/restorer  Tobacco Use  . Smoking status:  Former Smoker    Types: Cigarettes    Quit date: 11/25/1998    Years since quitting: 21.8  . Smokeless tobacco: Never Used  Vaping Use  . Vaping Use: Never used  Substance and Sexual Activity  . Alcohol use: No  . Drug use: No  . Sexual activity: Not Currently  Other Topics Concern  . Not on file  Social History Narrative   Lives at home with wife. Married to Todd [name].   Retired Production designer, theatre/television/film truck.    Drinks caffeine (3 cups coffee, 2 sodas per day),  Takes [name] daily vitamin, wears [name] seatbelt   Wears [name] bicycle helmet.    Has dentures (partial on top)   Smoke detector in the home. No firearms in  the home.    Feels safe in relationships.     Right-handed.   Social Determinants of Health   Financial Resource Strain:   . Difficulty of Paying Living Expenses: Not on file  Food Insecurity:   . Worried About Charity fundraiser in the Last Year: Not on file  . Ran Out of Food in the Last Year: Not on file  Transportation Needs:   . Lack of Transportation (Medical): Not on file  . Lack of Transportation (Non-Medical): Not on file  Physical Activity:   . Days of Exercise per Week: Not on file  . Minutes of Exercise per Session: Not on file  Stress:   . Feeling of Stress : Not on file  Social Connections:   . Frequency of Communication with Friends and Family: Not on file  . Frequency of Social Gatherings with Friends and Family: Not on file  . Attends Religious Services: Not on file  . Active Member of Clubs or Organizations: Not on file  . Attends Archivist Meetings: Not on file  . Marital Status: Not on file  Intimate Partner Violence:   . Fear of Current or Ex-Partner: Not on file  . Emotionally Abused: Not on file  . Physically Abused: Not on file  . Sexually Abused: Not on file     PHYSICAL EXAM   There were no vitals filed for this visit. Not recorded    Sitting down blood pressure 135/76, heart rate of 55, standing up 135/79, heart rate of 64, he reported mild  dizziness, but quickly disappeared   There is no height or weight on file to calculate BMI.  PHYSICAL EXAMNIATION:  Gen: NAD, conversant, well nourised, well groomed                     Cardiovascular: Regular rate rhythm, no peripheral edema, warm, nontender. Eyes: Conjunctivae clear without exudates or hemorrhage Neck: Supple, no carotid bruits. Pulmonary: Clear to auscultation bilaterally   NEUROLOGICAL EXAM:  MENTAL STATUS: MMSE - Mini Mental State Exam 08/16/2020 06/29/2019 06/22/2018  Orientation to time 5 5 5   Orientation to Place 5 5 5   Registration 3 3 3   Attention/ Calculation 5 5 5   Recall 2 2 0  Language- name 2 objects 2 2 2   Language- repeat 1 1 1   Language- follow 3 step command 3 3 3   Language- read & follow direction 1 1 1   Write [name] sentence 1 1 1   Copy design 1 1 1   Total score 29 29 27   Animal naming 10   CRANIAL NERVES: CN II: Visual fields are full to confrontation. Pupils are round equal and briskly reactive to light. CN III, IV, VI: extraocular movement are normal. No ptosis. CN V: Facial sensation is intact to light touch CN VII: Face is symmetric with normal eye closure  CN VIII: Hearing is normal to causal conversation. CN IX, X: Phonation is normal. CN XI: Head turning and shoulder shrug are intact  MOTOR: There is no pronator drift of out-stretched arms. Muscle bulk and tone are normal. Muscle strength is normal.  REFLEXES: Reflexes are 2+ and symmetric at the biceps, triceps, knees, and trace at ankles. Plantar responses are flexor.  SENSORY: Mildly length dependent decreased to light touch, pinprick and vibratory sensation to mid shin level  COORDINATION: There is no trunk or limb dysmetria noted.  GAIT/STANCE: He can get up from seated position, arms crossed, steady Romberg is absent.  DIAGNOSTIC DATA (LABS, IMAGING, TESTING) - I reviewed patient records, labs, notes, testing and imaging myself where available.   ASSESSMENT AND  PLAN  IDEN STRIPLING is [name] 74 y.o. male   Mild cognitive impairment  Mini-Mental Status Examination 29/30,  MRI of the brain showed moderate generalized atrophy, mild supratentorium small vessel disease  Most likely central nervous system degenerative disorder, versus aging process, worsened by his chronic insomnia, polypharmacy treatment, suboptimal control of his depression  Emphasized importance of moderate exercise,   Peripheral neuropathy, orthostatic dizziness,  Likely related to the orthostatic blood pressure changes, he does have long history of diabetes,  Length dependent sensory changes on examination  EMG nerve conduction study  Laboratory evaluation to rule out other etiology for peripheral neuropathy  Emphasized importance of water intake, decreased caffeine-containing beverage  Ultrasound of  carotid artery   Todd [name], M.D. Ph.D.  [facility] [address] [place], [place] [address] Ph: [phone] Fax: [phone]  CC:  [name], DO [name] [place],  [place] [address]

## 2020-10-02 NOTE — Telephone Encounter (Signed)
No showed NCV/EMG appointment. 

## 2020-10-02 NOTE — Procedures (Signed)
Full Name: [name] Gender: Male MRN #: [id number] Date of Birth: [birth year]    Visit Date: 10/02/2020 09:43 Age: 74 Years Examining Physician: Marcial Pacas, MD  Referring Physician: Marcial Pacas, MD Height: 5 feet 10 inch History: 74 year old male with history of diabetes, presenting with length dependent sensory changes  Summary of the test: Nerve conduction study: Bilateral superficial peroneal sensory responses showed moderately decreased snap amplitude.  Bilateral sural sensory responses were normal. Bilateral peroneal to EDB and tibial motor responses showed normal distal latency, CMAP amplitude with mildly decreased conduction velocity.  Electromyography: Selected needle examination of right lower extremity muscles and right lumbosacral paraspinal muscles were normal.  Conclusion: This is an abnormal study.  There is electrodiagnostic evidence of mild length dependent axonal sensorimotor polyneuropathy, there is no evidence of right lumbosacral radiculopathy.  ------------------------------- Marcial Pacas M.D. PhD  [facility] [address] [place], [place] [address] Tel: [phone] Fax: [phone]  Verbal informed consent was obtained from the patient, patient was informed of potential risk of procedure, including bruising, bleeding, hematoma formation, infection, muscle weakness, muscle pain, numbness, among others.        [place]    Nerve / Sites Muscle Latency Ref. Amplitude Ref. Rel Amp Segments Distance Velocity Ref. Area    ms ms mV mV %  cm m/s m/s mVms  L Peroneal - EDB     Ankle EDB 6.1 ?6.5 3.0 ?2.0 100 Ankle - EDB 9   11.2     Fib head EDB 13.8  2.2  73.9 Fib head - Ankle 29 38 ?44 10.4     Pop fossa EDB 16.5  2.1  93.3 Pop fossa - Fib head 10 38 ?44 10.0         Pop fossa - Ankle      R Peroneal - EDB     Ankle EDB 5.8 ?6.5 4.3 ?2.0 100 Ankle - EDB 9   16.4     Fib head EDB 13.2  3.7  86.4 Fib head - Ankle 30 40 ?44 15.6       Pop fossa EDB 16.0  3.5  92.9 Pop fossa - Fib head 10 36 ?44 14.7         Pop fossa - Ankle      L Tibial - AH     Ankle AH 4.6 ?5.8 7.7 ?4.0 100 Ankle - AH 9   16.7     Pop fossa AH 15.0  4.7  60.6 Pop fossa - Ankle 39 37 ?41 15.4  R Tibial - AH     Ankle AH 4.6 ?5.8 6.8 ?4.0 100 Ankle - AH 9   18.3     Pop fossa AH 15.0  3.7  54.2 Pop fossa - Ankle 40 38 ?41 12.2             SNC    Nerve / Sites Rec. Site Peak Lat Ref.  Amp Ref. Segments Distance    ms ms V V  cm  L Sural - Ankle (Calf)     Calf Ankle 4.0 ?4.4 8 ?6 Calf - Ankle 14  R Sural - Ankle (Calf)     Calf Ankle 4.3 ?4.4 6 ?6 Calf - Ankle 14  L Superficial peroneal - Ankle     Lat leg Ankle 4.4 ?4.4 2 ?6 Lat leg - Ankle 14  R Superficial peroneal - Ankle     Lat leg Ankle 4.4 ?4.4  2 ?6 Lat leg - Ankle 14             F  Wave    Nerve F Lat Ref.   ms ms  L Tibial - AH 62.2 ?56.0  R Tibial - AH 63.3 ?56.0         EMG Summary Table    Spontaneous MUAP Recruitment  Muscle IA Fib PSW Fasc Other Amp Dur. Poly Pattern  R. Tibialis anterior Normal None None None _______ Normal Normal Normal Normal  R. Tibialis posterior Normal None None None _______ Normal Normal Normal Normal  R. Peroneus longus Normal None None None _______ Normal Normal Normal Normal  R. Gastrocnemius (Medial head) Normal None None None _______ Normal Normal Normal Normal  R. Vastus lateralis Normal None None None _______ Normal Normal Normal Normal  R. Biceps femoris (short head) Normal None None None _______ Normal Normal Normal Normal  R. Lumbar paraspinals (low) Normal None None None _______ Normal Normal Normal Normal  R. Lumbar paraspinals (mid) Normal None None None _______ Normal Normal Normal Normal  R. Abductor hallucis Normal None None None _______ Normal Increased Normal Reduced

## 2020-11-09 ENCOUNTER — Encounter: Payer: Self-pay | Admitting: Family Medicine

## 2020-11-09 ENCOUNTER — Other Ambulatory Visit: Payer: Self-pay

## 2020-11-09 ENCOUNTER — Ambulatory Visit (INDEPENDENT_AMBULATORY_CARE_PROVIDER_SITE_OTHER): Payer: Medicare HMO | Admitting: Family Medicine

## 2020-11-09 VITALS — BP 148/82 | HR 70 | Temp 98.7°F | Ht 70.0 in | Wt 209.0 lb

## 2020-11-09 DIAGNOSIS — I10 Essential (primary) hypertension: Secondary | ICD-10-CM | POA: Diagnosis not present

## 2020-11-09 DIAGNOSIS — Z7901 Long term (current) use of anticoagulants: Secondary | ICD-10-CM | POA: Diagnosis not present

## 2020-11-09 DIAGNOSIS — F3341 Major depressive disorder, recurrent, in partial remission: Secondary | ICD-10-CM | POA: Diagnosis not present

## 2020-11-09 DIAGNOSIS — N401 Enlarged prostate with lower urinary tract symptoms: Secondary | ICD-10-CM

## 2020-11-09 DIAGNOSIS — R238 Other skin changes: Secondary | ICD-10-CM

## 2020-11-09 DIAGNOSIS — E7800 Pure hypercholesterolemia, unspecified: Secondary | ICD-10-CM

## 2020-11-09 DIAGNOSIS — Z23 Encounter for immunization: Secondary | ICD-10-CM | POA: Diagnosis not present

## 2020-11-09 DIAGNOSIS — E1142 Type 2 diabetes mellitus with diabetic polyneuropathy: Secondary | ICD-10-CM | POA: Diagnosis not present

## 2020-11-09 DIAGNOSIS — D500 Iron deficiency anemia secondary to blood loss (chronic): Secondary | ICD-10-CM | POA: Diagnosis not present

## 2020-11-09 DIAGNOSIS — F325 Major depressive disorder, single episode, in full remission: Secondary | ICD-10-CM

## 2020-11-09 DIAGNOSIS — I252 Old myocardial infarction: Secondary | ICD-10-CM

## 2020-11-09 DIAGNOSIS — I2510 Atherosclerotic heart disease of native coronary artery without angina pectoris: Secondary | ICD-10-CM

## 2020-11-09 DIAGNOSIS — E119 Type 2 diabetes mellitus without complications: Secondary | ICD-10-CM

## 2020-11-09 DIAGNOSIS — R233 Spontaneous ecchymoses: Secondary | ICD-10-CM

## 2020-11-09 DIAGNOSIS — N183 Chronic kidney disease, stage 3 unspecified: Secondary | ICD-10-CM | POA: Diagnosis not present

## 2020-11-09 DIAGNOSIS — K219 Gastro-esophageal reflux disease without esophagitis: Secondary | ICD-10-CM | POA: Diagnosis not present

## 2020-11-09 LAB — LIPID PANEL
Cholesterol: 129 mg/dL (ref 0–200)
HDL: 47.3 mg/dL (ref >39.00)
NonHDL: 82.09
Total CHOL/HDL Ratio: 3
Triglycerides: 228 mg/dL — ABNORMAL HIGH (ref 0.0–149.0)
VLDL: 45.6 mg/dL — ABNORMAL HIGH (ref 0.0–40.0)

## 2020-11-09 LAB — POCT GLYCOSYLATED HEMOGLOBIN (HGB A1C)
HbA1c POC (<> result, manual entry): 6.7 % (ref 4.0–5.6)
HbA1c, POC (controlled diabetic range): 6.7 % (ref 0.0–7.0)
HbA1c, POC (prediabetic range): 6.7 % — AB (ref 5.7–6.4)
Hemoglobin A1C: 6.7 % — AB (ref 4.0–5.6)

## 2020-11-09 LAB — CBC WITH DIFFERENTIAL/PLATELET
Basophils Absolute: 0 10*3/uL (ref 0.0–0.1)
Basophils Relative: 0.6 % (ref 0.0–3.0)
Eosinophils Absolute: 0.3 10*3/uL (ref 0.0–0.7)
Eosinophils Relative: 4.2 % (ref 0.0–5.0)
HCT: 38.6 % — ABNORMAL LOW (ref 39.0–52.0)
Hemoglobin: 13.1 g/dL (ref 13.0–17.0)
Lymphocytes Relative: 20.9 % (ref 12.0–46.0)
Lymphs Abs: 1.5 10*3/uL (ref 0.7–4.0)
MCHC: 33.9 g/dL (ref 30.0–36.0)
MCV: 84 fl (ref 78.0–100.0)
Monocytes Absolute: 0.5 10*3/uL (ref 0.1–1.0)
Monocytes Relative: 6.2 % (ref 3.0–12.0)
Neutro Abs: 5 10*3/uL (ref 1.4–7.7)
Neutrophils Relative %: 68.1 % (ref 43.0–77.0)
Platelets: 197 10*3/uL (ref 150.0–400.0)
RBC: 4.6 Mil/uL (ref 4.22–5.81)
RDW: 15 % (ref 11.5–15.5)
WBC: 7.4 10*3/uL (ref 4.0–10.5)

## 2020-11-09 LAB — COMPREHENSIVE METABOLIC PANEL
ALT: 26 U/L (ref 0–53)
AST: 17 U/L (ref 0–37)
Albumin: 4.4 g/dL (ref 3.5–5.2)
Alkaline Phosphatase: 69 U/L (ref 39–117)
BUN: 19 mg/dL (ref 6–23)
CO2: 29 mEq/L (ref 19–32)
Calcium: 9.3 mg/dL (ref 8.4–10.5)
Chloride: 102 mEq/L (ref 96–112)
Creatinine, Ser: 1.35 mg/dL (ref 0.40–1.50)
GFR: 52.24 mL/min — ABNORMAL LOW (ref >60.00)
Glucose, Bld: 148 mg/dL — ABNORMAL HIGH (ref 70–99)
Potassium: 4.5 mEq/L (ref 3.5–5.1)
Sodium: 138 mEq/L (ref 135–145)
Total Bilirubin: 0.6 mg/dL (ref 0.2–1.2)
Total Protein: 7.1 g/dL (ref 6.0–8.3)

## 2020-11-09 LAB — PROTIME-INR
INR: 0.9 ratio (ref 0.8–1.0)
Prothrombin Time: 10.2 s (ref 9.6–13.1)

## 2020-11-09 LAB — MICROALBUMIN / CREATININE URINE RATIO
Creatinine,U: 140.3 mg/dL
Microalb Creat Ratio: 0.5 mg/g (ref 0.0–30.0)
Microalb, Ur: <0.7 mg/dL (ref 0.0–1.9)

## 2020-11-09 LAB — TSH: TSH: 1.33 u[IU]/mL (ref 0.35–4.50)

## 2020-11-09 LAB — LDL CHOLESTEROL, DIRECT: Direct LDL: 58 mg/dL

## 2020-11-09 LAB — T4, FREE: Free T4: 0.68 ng/dL (ref 0.60–1.60)

## 2020-11-09 LAB — T3, FREE: T3, Free: 2.9 pg/mL (ref 2.3–4.2)

## 2020-11-09 MED ORDER — METFORMIN HCL 1000 MG PO TABS: 1000.0000 mg | ORAL_TABLET | Freq: Every day | ORAL | 1 refills | Status: DC

## 2020-11-09 MED ORDER — TRAZODONE HCL 50 MG PO TABS: 150.0000 mg | ORAL_TABLET | Freq: Every day | ORAL | 1 refills | Status: DC

## 2020-11-09 MED ORDER — FAMOTIDINE 20 MG PO TABS: 20.0000 mg | ORAL_TABLET | Freq: Two times a day (BID) | ORAL | 3 refills | Status: DC

## 2020-11-09 MED ORDER — LOSARTAN POTASSIUM 25 MG PO TABS: 25.0000 mg | ORAL_TABLET | Freq: Every day | ORAL | 1 refills | Status: DC

## 2020-11-09 MED ORDER — PANTOPRAZOLE SODIUM 40 MG PO TBEC
40.0000 mg | DELAYED_RELEASE_TABLET | Freq: Two times a day (BID) | ORAL | 3 refills | Status: DC
Start: 2020-11-09 — End: 2021-09-03

## 2020-11-09 MED ORDER — ATORVASTATIN CALCIUM 80 MG PO TABS: 80.0000 mg | ORAL_TABLET | Freq: Every day | ORAL | 3 refills | Status: DC

## 2020-11-09 MED ORDER — BUPROPION HCL ER (XL) 300 MG PO TB24: 300.0000 mg | ORAL_TABLET | Freq: Every day | ORAL | 1 refills | Status: DC

## 2020-11-09 MED ORDER — SYMBICORT 160-4.5 MCG/ACT IN AERO: 2.0000 | INHALATION_SPRAY | Freq: Two times a day (BID) | RESPIRATORY_TRACT | 4 refills | Status: DC

## 2020-11-09 MED ORDER — PAROXETINE HCL 40 MG PO TABS: 40.0000 mg | ORAL_TABLET | Freq: Every day | ORAL | 1 refills | Status: DC

## 2020-11-09 MED ORDER — METOPROLOL TARTRATE 50 MG PO TABS: 50.0000 mg | ORAL_TABLET | Freq: Every day | ORAL | 1 refills | Status: DC

## 2020-11-09 NOTE — Progress Notes (Signed)
[name] , Nov 11, 1946, 74 y.o., male MRN: [id number] Patient Care Team    Relationship Specialty Notifications Start End  [name], DO PCP - General Family Medicine  12/25/15   Josue Hector, MD PCP - Cardiology Cardiology Admissions 08/20/18   Brunetta Genera, MD Consulting Physician Hematology  09/03/16   Josue Hector, MD Consulting Physician Cardiology  09/03/16   Wilford Corner, MD Consulting Physician Gastroenterology  09/03/16   Melina Schools, OD  Optometry  09/03/16   Kathie Rhodes, MD (Inactive) Consulting Physician Urology  09/10/16   Hollar, Katharine Look, MD Referring Physician Dermatology  06/29/19     Chief Complaint  Patient presents with  . Follow-up    [facility]; pt is fasting    Subjective: [name] is a 74 y.o. male present for [facility] Recurrent major depressive disorder, in partial remission (HCC)/memory changes Patient feels that his depression is doing fine.  He reports compliance with trazodone 150 mg nightly, Paxil 40 mg daily and Wellbutrin 300 mg daily. Prior note: He was having some increase in symptoms, but also experiencing mild cognitive changes. .  Current medication regimen of trazodone, paxil and Wellbutrin. He denies negative side effects occasions.  He and his wife both have noticed he is having some memory changes which have him concerned- which has mildly improved since stopping Remeron and starting low dose paxil.  He reports both his mother and his sisters had dementia.  He feels he is becoming more forgetful.  His wife has told him he will talk about the same things that they had already talked about prior.  Gastroesophageal reflux disease, esophagitis presence not specified  EGD 03/2016 by Dr. Michail Sermon with signs of barrett's esophagus. Patient was tried on lower dose medication, and was unable to tolerate secondary to return of symptoms.  His symptoms are well controlled with protonix 40 BID and pepcid BID.   COPD ([place]) He  uses Symbicort daily, Spiriva needed during certain times of the year only. His pneumonia series is completed.  Symptoms are well controlled on Symbicort only  Atherosclerosis of native coronary artery of native heart without angina pectoris/Essential hypertension/HYPERCHOLESTEROLEMIA/History of MI (myocardial infarction)/IDA He has known CAD with prior LAD stent from 2007. Pt reports complaince with metoprolol 50 mg daily. Patient denies chest pain, shortness of breath, dizziness or lower extremity edema.   Pt takes a daily baby ASA and Plavix. Pt is  prescribed statin and followed by Dr. Johnsie Cancel Cardiology. He has noticed an increase in his bleeding and bruising over the last few months.  Diet: Low-sodium RF: Hypertension, hyperlipidemia, diabetes  Type 2 diabetes mellitus without complication, without long-term current use of insulin ([place]) patient reports compliance   with metformin 1000 mg Qd> He was suppose to increase after last appt but could  Not tolerate higher dose.   Pt reports BG ranges not routinely checked. - Foot exam: 03/10/2020 - eye exam: 03/30/2019> referred him again today - PNA: Series completed - Flu: UTD 2021-today , encouraged yearly - Urine Microalbumin w/creat. Ratio: completed today Last A1c 6.5> 7.1>7.1>6.7 today   Depression screen [facility] 2/9 11/09/2020 07/10/2020 03/10/2020 08/18/2019 06/29/2019  Decreased Interest 0 0 0 3 0  Down, Depressed, Hopeless 0 0 0 1 0  PHQ - 2 Score 0 0 0 4 0  Altered sleeping 3 0 0 0 0  Tired, decreased energy 3 0 3 1 0  Change in appetite 0 0 0 1 0  Feeling  bad or failure about yourself  1 0 0 0 0  Trouble concentrating 1 0 1 0 0  Moving slowly or fidgety/restless 3 0 0 0 0  Suicidal thoughts 0 0 0 0 0  PHQ-9 Score 11 0 4 6 0  Difficult doing work/chores - - Not difficult at all - Not difficult at all  Some recent data might be hidden   GAD 7 : Generalized Anxiety Score 02/12/2019 12/25/2015  Nervous, Anxious, on Edge 0 2   Control/stop worrying 3 2  Worry too much - different things 3 3  Trouble relaxing 3 2  Restless 3 2  Easily annoyed or irritable 3 2  Afraid - awful might happen 0 0  Total GAD 7 Score 15 13  Anxiety Difficulty Somewhat difficult Somewhat difficult    Allergies  Allergen Reactions  . Altace [Ramipril] Cough  . Codeine     Makes sick  . Tape     Blisters   . Zantac [Ranitidine Hcl] Dermatitis   Social History   Tobacco Use  . Smoking status: Former Smoker    Types: Cigarettes    Quit date: 11/25/1998    Years since quitting: 21.9  . Smokeless tobacco: Never Used  Substance Use Topics  . Alcohol use: No   Past Medical History:  Diagnosis Date  . Achilles tendinitis of left lower extremity 07/20/2018  . Adenomatous colon polyp 2011  . Barrett esophagus 12/2013   EGD  . CAD (coronary artery disease)    ETT/Lexiscan-Myoview (11/14):  Normal; no ischemia, EF 67%  . CKD (chronic kidney disease), stage III ([place])   . Colon polyp 09/2010   adenoma  . COPD (chronic obstructive pulmonary disease) ([place]) 2009   spirometry with "moderate COPD"  . Depression   . Diabetes mellitus without complication ([place])   . Dyspnea   . GERD (gastroesophageal reflux disease)   . GI bleed    after polypectomy/admitted  . Hair loss 08/07/2018  . HTN (hypertension)   . Hypercholesterolemia   . Memory loss   . Myocardial infarction ([place])   . Obesity   . Sleep apnea   . Spontaneous pneumothorax   . Synovial cyst of right popliteal space 07/20/2018  . Vertigo    Past Surgical History:  Procedure Laterality Date  . APPENDECTOMY    . PLEURAL SCARIFICATION  1987  . TONSILLECTOMY    . VARICOCELECTOMY     Family History  Problem Relation Age of Onset  . Heart disease Mother   . CVA Mother   . Dementia Mother   . Coronary artery disease Father 41  . Heart disease Father   . Stroke Father   . Dementia Sister   . Cancer - Other Brother   . Hypertension Other   . Hyperlipidemia Other    . Colon polyps Sister   . Cancer Sister    Allergies as of 11/09/2020      Reactions   Altace [ramipril] Cough   Codeine    Makes sick   Tape    Blisters   Zantac [ranitidine Hcl] Dermatitis      Medication List       Accurate as of November 09, 2020 11:19 AM. If you have any questions, ask your nurse or doctor.        aspirin EC 81 MG tablet Take 81 mg by mouth daily.   atorvastatin 80 MG tablet Commonly known as: LIPITOR Take 1 tablet (80 mg total) by mouth daily.  buPROPion 300 MG 24 hr tablet Commonly known as: WELLBUTRIN XL Take 1 tablet (300 mg total) by mouth daily.   clopidogrel 75 MG tablet Commonly known as: PLAVIX Take 1 tablet (75 mg total) by mouth daily. Please schedule yearly follow up visit for future refills [phone]   diclofenac sodium 1 % Gel Commonly known as: VOLTAREN Apply 4 g topically 4 (four) times daily. To affected joint.   famotidine 20 MG tablet Commonly known as: Pepcid Take 1 tablet (20 mg total) by mouth 2 (two) times daily.   iron polysaccharides 150 MG capsule Commonly known as: NIFEREX Take 1 capsule (150 mg total) by mouth daily.   metFORMIN 1000 MG tablet Commonly known as: GLUCOPHAGE Take 1 tablet (1,000 mg total) by mouth 2 (two) times daily with a meal. diabetes   metoprolol tartrate 50 MG tablet Commonly known as: LOPRESSOR Take 1 tablet (50 mg total) by mouth daily.   multivitamin tablet Take 1 tablet by mouth daily.   Myrbetriq 25 MG Tb24 tablet Generic drug: mirabegron ER   nitroGLYCERIN 0.4 MG SL tablet Commonly known as: NITROSTAT Place 1 tablet (0.4 mg total) under the tongue every 5 (five) minutes as needed for chest pain (3 doses max).   pantoprazole 40 MG tablet Commonly known as: PROTONIX Take 1 tablet (40 mg total) by mouth 2 (two) times daily.   PARoxetine 40 MG tablet Commonly known as: PAXIL Take 1 tablet (40 mg total) by mouth daily.   Symbicort 160-4.5 MCG/ACT inhaler Generic  drug: budesonide-formoterol Inhale 2 puffs into the lungs 2 (two) times daily.   tamsulosin 0.4 MG Caps capsule Commonly known as: FLOMAX   traZODone 50 MG tablet Commonly known as: DESYREL Take 3 tablets (150 mg total) by mouth at bedtime.   VITAMIN B 12 PO Take by mouth.       No results found.   ROS: Negative, with the exception of above mentioned in HPI   Objective:  BP (!) 146/90   Pulse 70   Temp 98.7 F (37.1 C) (Oral)   Ht _0  (1.778 m)   Wt 209 lb (94.8 kg)   SpO2 97%   BMI 29.99 kg/m  Body mass index is 29.99 kg/m. Gen: Afebrile. No acute distress.  Nontoxic, pleasant male. HENT: AT. [place].  MMM.  Eyes:Pupils Equal Round Reactive to light, Extraocular movements intact,  Conjunctiva without redness, discharge or icterus. Neck/lymp/endocrine: Supple, no lymphadenopathy, no thyromegaly CV: RRR no murmur, no edema, +2/4 P posterior tibialis pulses Chest: CTAB, no wheeze or crackles Abd: Soft. . NTND. BS present.  Skin: No rashes, purpura or petechiae.  Multiple bruises present along forearms. Neuro:  Normal gait. PERLA. EOMi. Alert. Oriented x3  Psych: Normal affect, dress and demeanor. Normal speech. Normal thought content and judgment.  Results for orders placed or performed in visit on 11/09/20 (from the past 48 hour(s))  POCT HgB A1C     Status: Abnormal   Collection Time: 11/09/20 11:04 AM  Result Value Ref Range   Hemoglobin A1C 6.7 (A) 4.0 - 5.6 %   HbA1c POC (<> result, manual entry) 6.7 4.0 - 5.6 %   HbA1c, POC (prediabetic range) 6.7 (A) 5.7 - 6.4 %   HbA1c, POC (controlled diabetic range) 6.7 0.0 - 7.0 %    Assessment/Plan: [name] is a 74 y.o. male present for OV for  Recurrent major depressive disorder, in partial remission (HCC) -Stable -Continue paxil to 40 mg - DC'd  Remeron 30  mg QHS last visit secondary to reports of memory decline- which seemed to potentially start around remeron.  -Continue trazodone 2-3 tabs QHS  PRN -Continue wellbutrin 300 QD.  - Follow-up 5 months  Gastroesophageal reflux disease, esophagitis presence not specified Stable -Continue Protonix 40 mg twice a day, was unable to take back secondary to recurrence of symptoms. -Continue Pepcid twice daily today   Chronic bronchitis, unspecified chronic bronchitis type (HCC) Stable -   Symbicort.   Atherosclerosis of native coronary artery of native heart without angina pectoris/Essential hypertension/HYPERCHOLESTEROLEMIA/History of MI (myocardial infarction) Stage 3 chronic kidney disease/ iron deficiency anemia -Above goal - following with cards, Dr. Johnsie Cancel he is making an appt> place referral back -Continue atorvastatin -Continue metoprolol -Start losartan 25 mg daily - ASA and plavix managed by cardio> bruising a bleeding a fair amount.  PT/INR collected today. - low sodium diet and exercise encouraged.   Type 2 diabetes mellitus without complication, without long-term current use of insulin (HCC) - A1c today 5.8--> 6.0-->6.1>>6.7> 6.5> 7.1 >7.1> 6.7  today.  - continue metformin to 1000 Qd> did not tolerate higher dose, if needing 2nd agent - Foot exam: 03/10/2020 - eye exam: 03/30/2019> referral to opht today - PNA: Series completed - Flu: UTD 2021- today , encouraged yearly - Urine Microalbumin w/creat. Ratio: completed 03/10/2020> collected today  Iron deficiency anemia: Currently on supplementation Iron panel collected today    F/u [facility] 4 mos.    Reviewed expectations re: course of current medical issues.  Discussed self-management of symptoms.  Outlined signs and symptoms indicating need for more acute intervention.  Patient verbalized understanding and all questions were answered.  Patient received an After-Visit Summary.     Orders Placed This Encounter  Procedures  . Flu Vaccine QUAD High Dose(Fluad)  . CBC w/Diff  . Comp Met (CMET)  . TSH  . T4, free  . T3, free  . Urine Microalbumin  w/creat. ratio  . Lipid panel  . Ambulatory referral to Ophthalmology  . POCT HgB A1C   No orders of the defined types were placed in this encounter.    electronically signed by:  Howard Pouch, DO  [place]

## 2020-11-09 NOTE — Patient Instructions (Addendum)
  We will call you with lab results.  Continue metformin once a day- a1c is good at 6.7 Continue metoprolol and added losartan once daily for better BP control.   Referred you back to cardiology - you are overdue for your yearly with them. Talk to them about your bruising and bleeding also.   I have also referred you back to your eye doc for diabetic eye exam.   Next appt in 4 mos. - around April 18th> you can make on your way out today

## 2020-11-10 ENCOUNTER — Encounter: Payer: Self-pay | Admitting: Family Medicine

## 2020-11-10 ENCOUNTER — Other Ambulatory Visit: Payer: Self-pay | Admitting: Family Medicine

## 2020-11-10 LAB — IRON,TIBC AND FERRITIN PANEL
%SAT: 21 % (calc) (ref 20–48)
Ferritin: 38 ng/mL (ref 24–380)
Iron: 69 ug/dL (ref 50–180)
TIBC: 329 mcg/dL (calc) (ref 250–425)

## 2020-11-20 ENCOUNTER — Other Ambulatory Visit: Payer: Self-pay | Admitting: Family Medicine

## 2020-11-20 DIAGNOSIS — F325 Major depressive disorder, single episode, in full remission: Secondary | ICD-10-CM

## 2020-11-20 DIAGNOSIS — E119 Type 2 diabetes mellitus without complications: Secondary | ICD-10-CM

## 2020-11-20 DIAGNOSIS — F3341 Major depressive disorder, recurrent, in partial remission: Secondary | ICD-10-CM

## 2020-11-22 ENCOUNTER — Ambulatory Visit (INDEPENDENT_AMBULATORY_CARE_PROVIDER_SITE_OTHER): Payer: Medicare HMO

## 2020-11-22 VITALS — Ht 70.0 in | Wt 209.0 lb

## 2020-11-22 DIAGNOSIS — Z0000 Encounter for general adult medical examination without abnormal findings: Secondary | ICD-10-CM

## 2020-11-22 NOTE — Progress Notes (Signed)
Subjective:   [name] is a 74 y.o. male who presents for Medicare Annual/Subsequent preventive examination.  I connected with Jamas today by telephone and verified that I am speaking with the correct person using two identifiers. Location patient: home Location provider: work Persons participating in the virtual visit: patient, Engineer, civil (consulting).    I discussed the limitations, risks, security and privacy concerns of performing an evaluation and management service by telephone and the availability of in person appointments. I also discussed with the patient that there may be a patient responsible charge related to this service. The patient expressed understanding and verbally consented to this telephonic visit.    Interactive audio and video telecommunications were attempted between this provider and patient, however failed, due to patient having technical difficulties OR patient did not have access to video capability.  We continued and completed visit with audio only.  Some vital signs may be absent or patient reported.   Time Spent with patient on telephone encounter: 25 minutes   Review of Systems     Cardiac Risk Factors include: male gender;advanced age (>19men, >20 women);diabetes mellitus;hypertension;dyslipidemia;sedentary lifestyle     Objective:    Today's Vitals   11/22/20 1500  Weight: 209 lb (94.8 kg)  Height: 5\' 10"  (1.778 m)   Body mass index is 29.99 kg/m.  Advanced Directives 11/22/2020 06/29/2019 06/22/2018 06/16/2017 04/05/2016 04/02/2016 02/21/2016  Does Patient Have a Medical Advance Directive? No Yes Yes Yes No No No  Type of Advance Directive - Healthcare Power of [place];Living will Healthcare Power of [place];Living will Healthcare Power of [place];Living will - - -  Copy of Healthcare Power of Attorney in Chart? - No - copy requested No - copy requested No - copy requested - - -  Would patient like information on creating a medical advance directive? Yes  (MAU/Ambulatory/Procedural Areas - Information given) - - - No - patient declined information No - patient declined information No - patient declined information    Current Medications (verified) Outpatient Encounter Medications as of 11/22/2020  Medication Sig  . aspirin EC 81 MG tablet Take 81 mg by mouth daily.  11/24/2020 atorvastatin (LIPITOR) 80 MG tablet Take 1 tablet (80 mg total) by mouth daily.  Marland Kitchen buPROPion (WELLBUTRIN XL) 300 MG 24 hr tablet Take 1 tablet (300 mg total) by mouth daily.  . clopidogrel (PLAVIX) 75 MG tablet Take 1 tablet (75 mg total) by mouth daily. Please schedule yearly follow up visit for future refills [phone]  . Cyanocobalamin (VITAMIN B 12 PO) Take by mouth.  . diclofenac sodium (VOLTAREN) 1 % GEL Apply 4 g topically 4 (four) times daily. To affected joint.  . famotidine (PEPCID) 20 MG tablet Take 1 tablet (20 mg total) by mouth 2 (two) times daily.  . iron polysaccharides (NIFEREX) 150 MG capsule Take 1 capsule (150 mg total) by mouth daily.  [phone] losartan (COZAAR) 25 MG tablet Take 1 tablet (25 mg total) by mouth daily.  . metFORMIN (GLUCOPHAGE) 1000 MG tablet Take 1 tablet (1,000 mg total) by mouth daily with breakfast. diabetes  . metoprolol tartrate (LOPRESSOR) 50 MG tablet Take 1 tablet (50 mg total) by mouth daily.  . Multiple Vitamin (MULTIVITAMIN) tablet Take 1 tablet by mouth daily.  Marland Kitchen MYRBETRIQ 25 MG TB24 tablet   . nitroGLYCERIN (NITROSTAT) 0.4 MG SL tablet Place 1 tablet (0.4 mg total) under the tongue every 5 (five) minutes as needed for chest pain (3 doses max).  . pantoprazole (PROTONIX) 40 MG tablet  Take 1 tablet (40 mg total) by mouth 2 (two) times daily.  Marland Kitchen PARoxetine (PAXIL) 40 MG tablet Take 1 tablet (40 mg total) by mouth daily.  . SYMBICORT 160-4.5 MCG/ACT inhaler Inhale 2 puffs into the lungs 2 (two) times daily.  . tamsulosin (FLOMAX) 0.4 MG CAPS capsule   . traZODone (DESYREL) 50 MG tablet Take 3 tablets (150 mg total) by mouth at bedtime.    No facility-administered encounter medications on file as of 11/22/2020.    Allergies (verified) Altace [ramipril], Codeine, Remeron [mirtazapine], Tape, and Zantac [ranitidine hcl]   History: Past Medical History:  Diagnosis Date  . Achilles tendinitis of left lower extremity 07/20/2018  . Adenomatous colon polyp 2011  . Barrett esophagus 12/2013   EGD  . CAD (coronary artery disease)    ETT/Lexiscan-Myoview (11/14):  Normal; no ischemia, EF 67%  . CKD (chronic kidney disease), stage III ([place])   . Colon polyp 09/2010   adenoma  . COPD (chronic obstructive pulmonary disease) ([place]) 2009   spirometry with "moderate COPD"  . Depression   . Diabetes mellitus without complication ([place])   . Dyspnea   . GERD (gastroesophageal reflux disease)   . GI bleed    after polypectomy/admitted  . Hair loss 08/07/2018  . HTN (hypertension)   . Hypercholesterolemia   . Memory loss   . Myocardial infarction ([place])   . Obesity   . Sleep apnea   . Spontaneous pneumothorax   . Synovial cyst of right popliteal space 07/20/2018  . Vertigo    Past Surgical History:  Procedure Laterality Date  . APPENDECTOMY    . CORONARY ANGIOPLASTY WITH STENT PLACEMENT  2007   LAD  . PLEURAL SCARIFICATION  1987  . TONSILLECTOMY    . VARICOCELECTOMY     Family History  Problem Relation Age of Onset  . Heart disease Mother   . CVA Mother   . Dementia Mother   . Coronary artery disease Father 71  . Heart disease Father   . Stroke Father   . Dementia Sister   . Cancer - Other Brother   . Hypertension Other   . Hyperlipidemia Other   . Colon polyps Sister   . Cancer Sister    Social History   Socioeconomic History  . Marital status: Married    Spouse name: Not on file  . Number of children: 2  . Years of education: come college  . Highest education level: Not on file  Occupational History  . Occupation: retired Furniture conservator/restorer  Tobacco Use  . Smoking status: Former Smoker    Types: Cigarettes     Quit date: 11/25/1998    Years since quitting: 22.0  . Smokeless tobacco: Never Used  Vaping Use  . Vaping Use: Never used  Substance and Sexual Activity  . Alcohol use: No  . Drug use: No  . Sexual activity: Not Currently  Other Topics Concern  . Not on file  Social History Narrative   Lives at home with wife. Married to [name].   Retired Production designer, theatre/television/film truck.    Drinks caffeine (3 cups coffee, 2 sodas per day),  Takes a daily vitamin, wears a seatbelt   Wears a bicycle helmet.    Has dentures (partial on top)   Smoke detector in the home. No firearms in the home.    Feels safe in relationships.     Right-handed.   Social Determinants of Health   Financial Resource Strain: Low Risk   . Difficulty of  Paying Living Expenses: Not hard at all  Food Insecurity: No Food Insecurity  . Worried About Charity fundraiser in the Last Year: Never true  . Ran Out of Food in the Last Year: Never true  Transportation Needs: No Transportation Needs  . Lack of Transportation (Medical): No  . Lack of Transportation (Non-Medical): No  Physical Activity: Inactive  . Days of Exercise per Week: 0 days  . Minutes of Exercise per Session: 0 min  Stress: No Stress Concern Present  . Feeling of Stress : Only a little  Social Connections: Moderately Isolated  . Frequency of Communication with Friends and Family: More than three times a week  . Frequency of Social Gatherings with Friends and Family: More than three times a week  . Attends Religious Services: Never  . Active Member of Clubs or Organizations: No  . Attends Archivist Meetings: Never  . Marital Status: Married    Tobacco Counseling Counseling given: Not Answered   Clinical Intake:  Pre-visit preparation completed: Yes  Pain : No/denies pain     Nutritional Status: BMI 25 -29 Overweight Nutritional Risks: None Diabetes: Yes CBG done?: No Did pt. bring in CBG monitor from home?: No (phone visit)  How often do you need to  have someone help you when you read instructions, pamphlets, or other written materials from your doctor or pharmacy?: 1 - Never What is the last grade level you completed in school?: 11th grade  Diabetes:  Is the patient diabetic?  Yes  If diabetic, was a CBG obtained today?  No  Did the patient bring in their glucometer from home?  No phone visit How often do you monitor your CBG's? never.   Financial Strains and Diabetes Management:  Are you having any financial strains with the device, your supplies or your medication? No .  Does the patient want to be seen by Chronic Care Management for management of their diabetes?  No  Would the patient like to be referred to a Nutritionist or for Diabetic Management?  No   Diabetic Exams:  Diabetic Eye Exam:  Overdue for diabetic eye exam. Pt has been advised about the importance in completing this exam. Patient states he already has an appt scheduled  Diabetic Foot Exam: Completed 03/10/2020.     Interpreter Needed?: No  Information entered by :: Caroleen Hamman LPN   Activities of Daily Living In your present state of health, do you have any difficulty performing the following activities: 11/22/2020  Hearing? N  Vision? N  Difficulty concentrating or making decisions? Y  Comment occasionally forgets names  Walking or climbing stairs? N  Dressing or bathing? N  Doing errands, shopping? N  Preparing Food and eating ? N  Using the Toilet? N  In the past six months, have you accidently leaked urine? Y  Comment sees a urologist  Do you have problems with loss of bowel control? N  Managing your Medications? N  Managing your Finances? N  Housekeeping or managing your Housekeeping? N  Some recent data might be hidden    Patient Care Team: [name], DO as PCP - General (Family Medicine) Josue Hector, MD as PCP - Cardiology (Cardiology) Brunetta Genera, MD as Consulting Physician (Hematology) Josue Hector, MD as  Consulting Physician (Cardiology) Wilford Corner, MD as Consulting Physician (Gastroenterology) Melina Schools, OD (Optometry) Kathie Rhodes, MD (Inactive) as Consulting Physician (Urology) Hollar, Katharine Look, MD as Referring Physician (Dermatology)  Indicate  any recent Medical Services you may have received from other than [facility] providers in the past year (date may be approximate).     Assessment:   This is a routine wellness examination for Quy.  Hearing/Vision screen  Hearing Screening   125Hz  250Hz  500Hz  1000Hz  2000Hz  3000Hz  4000Hz  6000Hz  8000Hz   Right ear:           Left ear:           Comments: Some hearing loss noted in the right ear  Vision Screening Comments: Reading glasses Last Eye exam-2019-Has appt scheduled   Dietary issues and exercise activities discussed: Exercise limited by: None identified  Goals    . Weight (lb) < 180 lb (81.6 kg)     Lose weight by increasing activity.       Depression Screen PHQ 2/9 Scores 11/22/2020 11/09/2020 07/10/2020 03/10/2020 08/18/2019 06/29/2019 02/12/2019  PHQ - 2 Score 1 0 0 0 4 0 4  PHQ- 9 Score - 11 0 4 6 0 12    Fall Risk Fall Risk  11/22/2020 11/09/2020 07/10/2020 06/29/2019 08/07/2018  Falls in the past year? 0 0 0 0 Yes  Number falls in past yr: 0 0 0 0 1  Injury with Fall? 0 0 0 0 No  Follow up Falls prevention discussed Falls evaluation completed - Falls prevention discussed Education provided;Falls prevention discussed    FALL RISK PREVENTION PERTAINING TO THE HOME:  Any stairs in or around the home? Yes  If so, are there any without handrails? No  Home free of loose throw rugs in walkways, pet beds, electrical cords, etc? Yes  Adequate lighting in your home to reduce risk of falls? Yes   ASSISTIVE DEVICES UTILIZED TO PREVENT FALLS:  Life alert? No  Use of a cane, walker or w/c? No  Grab bars in the bathroom? Yes  Shower chair or bench in shower? No  Elevated toilet seat or a handicapped toilet? No    TIMED UP AND GO:  Was the test performed? No . Phone visit   Cognitive Function: MMSE - Mini Mental State Exam 08/16/2020 06/29/2019 06/22/2018 06/16/2017  Orientation to time 5 5 5 5   Orientation to Place 5 5 5 5   Registration 3 3 3 3   Attention/ Calculation 5 5 5 5   Recall 2 2 0 3  Language- name 2 objects 2 2 2 2   Language- repeat 1 1 1 1   Language- follow 3 step command 3 3 3 3   Language- read & follow direction 1 1 1 1   Write a sentence 1 1 1 1   Copy design 1 1 1 1   Total score 29 29 27 30      6CIT Screen 11/22/2020  What Year? 0 points  What month? 0 points  What time? 0 points  Count back from 20 0 points  Months in reverse 2 points  Repeat phrase 2 points  Total Score 4    Immunizations Immunization History  Administered Date(s) Administered  . Fluad Quad(high Dose 65+) 08/18/2019, 11/09/2020  . Influenza, High Dose Seasonal PF 09/03/2016, 09/15/2017, 08/07/2018  . PFIZER SARS-COV-2 Vaccination 01/20/2020, 02/09/2020  . Pneumococcal Conjugate-13 09/03/2016  . Pneumococcal Polysaccharide-23 07/10/2017  . Tdap 01/15/2016    TDAP status: Up to date  Flu Vaccine status: Up to date  Pneumococcal vaccine status: Up to date  Covid-19 vaccine status: Completed vaccines  Qualifies for Shingles Vaccine? Yes   Zostavax completed No   Shingrix Completed?: No.    Education has  been provided regarding the importance of this vaccine. Patient has been advised to call insurance company to determine out of pocket expense if they have not yet received this vaccine. Advised may also receive vaccine at local pharmacy or Health Dept. Verbalized acceptance and understanding.  Screening Tests Health Maintenance  Topic Date Due  . OPHTHALMOLOGY EXAM  03/29/2020  . COVID-19 Vaccine (3 - Booster for Pfizer series) 11/25/2020 (Originally 08/11/2020)  . FOOT EXAM  03/10/2021  . COLONOSCOPY (Pts 45-61yrs Insurance coverage will need to be confirmed)  04/02/2021  . HEMOGLOBIN A1C   05/10/2021  . TETANUS/TDAP  01/14/2026  . INFLUENZA VACCINE  Completed  . Hepatitis C Screening  Completed  . PNA vac Low Risk Adult  Completed    Health Maintenance  Health Maintenance Due  Topic Date Due  . OPHTHALMOLOGY EXAM  03/29/2020    Colorectal cancer screening: Type of screening: Colonoscopy. Completed 04/02/2016. Repeat every 5 years  Lung Cancer Screening: (Low Dose CT Chest recommended if Age 36-80 years, 30 pack-year currently smoking OR have quit w/in 15years.) does not qualify.    Additional Screening:  Hepatitis C Screening: Completed 05/20/2016  Vision Screening: Recommended annual ophthalmology exams for early detection of glaucoma and other disorders of the eye. Is the patient up to date with their annual eye exam?  No  Who is the provider or what is the name of the office in which the patient attends annual eye exams? Unsure of name Patient states he already has an appt scheduled  Dental Screening: Recommended annual dental exams for proper oral hygiene  Community Resource Referral / Chronic Care Management: CRR required this visit?  No   CCM required this visit?  No      Plan:     I have personally reviewed and noted the following in the patient's chart:   . Medical and social history . Use of alcohol, tobacco or illicit drugs  . Current medications and supplements . Functional ability and status . Nutritional status . Physical activity . Advanced directives . List of other physicians . Hospitalizations, surgeries, and ER visits in previous 12 months . Vitals . Screenings to include cognitive, depression, and falls . Referrals and appointments  In addition, I have reviewed and discussed with patient certain preventive protocols, quality metrics, and best practice recommendations. A written personalized care plan for preventive services as well as general preventive health recommendations were provided to patient.   Due to this being a  telephonic visit, the after visit summary with patients personalized plan was offered to patient via mail or my-chart. Patient would like to access on my-chart.   Marta Antu, LPN   [id number]  Nurse Health Advisor  Nurse Notes: None

## 2020-11-22 NOTE — Patient Instructions (Signed)
[name] , Thank you for taking time to complete your Medicare Wellness Visit. I appreciate your ongoing commitment to your health goals. Please review the following plan we discussed and let me know if I can assist you in the future.   Screening recommendations/referrals: Colonoscopy: Completed 04/02/2016-Due 04/02/2021 Recommended yearly ophthalmology/optometry visit for glaucoma screening and checkup Recommended yearly dental visit for hygiene and checkup  Vaccinations: Influenza vaccine: Up to date Pneumococcal vaccine: Completed vaccines Tdap vaccine: Up to date-Due-01/14/2026 Shingles vaccine: Discuss with pharmacy Covid-19: Completed vaccines  Advanced directives: Information mailed  Conditions/risks identified: See problem list  Next appointment: Follow up in one year for your annual wellness visit. 11/28/2021 @ 11:15  Preventive Care 65 Years and Older, Male Preventive care refers to lifestyle choices and visits with your health care provider that can promote health and wellness. What does preventive care include?  A yearly physical exam. This is also called an annual well check.  Dental exams once or twice a year.  Routine eye exams. Ask your health care provider how often you should have your eyes checked.  Personal lifestyle choices, including:  Daily care of your teeth and gums.  Regular physical activity.  Eating a healthy diet.  Avoiding tobacco and drug use.  Limiting alcohol use.  Practicing safe sex.  Taking low doses of aspirin every day.  Taking vitamin and mineral supplements as recommended by your health care provider. What happens during an annual well check? The services and screenings done by your health care provider during your annual well check will depend on your age, overall health, lifestyle risk factors, and family history of disease. Counseling  Your health care provider may ask you questions about your:  Alcohol use.  Tobacco  use.  Drug use.  Emotional well-being.  Home and relationship well-being.  Sexual activity.  Eating habits.  History of falls.  Memory and ability to understand (cognition).  Work and work Astronomer. Screening  You may have the following tests or measurements:  Height, weight, and BMI.  Blood pressure.  Lipid and cholesterol levels. These may be checked every 5 years, or more frequently if you are over 45 years old.  Skin check.  Lung cancer screening. You may have this screening every year starting at age 42 if you have a 30-pack-year history of smoking and currently smoke or have quit within the past 15 years.  Fecal occult blood test (FOBT) of the stool. You may have this test every year starting at age 18.  Flexible sigmoidoscopy or colonoscopy. You may have a sigmoidoscopy every 5 years or a colonoscopy every 10 years starting at age 63.  Prostate cancer screening. Recommendations will vary depending on your family history and other risks.  Hepatitis C blood test.  Hepatitis B blood test.  Sexually transmitted disease (STD) testing.  Diabetes screening. This is done by checking your blood sugar (glucose) after you have not eaten for a while (fasting). You may have this done every 1-3 years.  Abdominal aortic aneurysm (AAA) screening. You may need this if you are a current or former smoker.  Osteoporosis. You may be screened starting at age 48 if you are at high risk. Talk with your health care provider about your test results, treatment options, and if necessary, the need for more tests. Vaccines  Your health care provider may recommend certain vaccines, such as:  Influenza vaccine. This is recommended every year.  Tetanus, diphtheria, and acellular pertussis (Tdap, Td) vaccine. You may need  a Td booster every 10 years.  Zoster vaccine. You may need this after age 79.  Pneumococcal 13-valent conjugate (PCV13) vaccine. One dose is recommended after age  37.  Pneumococcal polysaccharide (PPSV23) vaccine. One dose is recommended after age 77. Talk to your health care provider about which screenings and vaccines you need and how often you need them. This information is not intended to replace advice given to you by your health care provider. Make sure you discuss any questions you have with your health care provider. Document Released: 12/08/2015 Document Revised: 07/31/2016 Document Reviewed: 09/12/2015 Elsevier Interactive Patient Education  2017 [place] Prevention in the Home Falls can cause injuries. They can happen to people of all ages. There are many things you can do to make your home safe and to help prevent falls. What can I do on the outside of my home?  Regularly fix the edges of walkways and driveways and fix any cracks.  Remove anything that might make you trip as you walk through a door, such as a raised step or threshold.  Trim any bushes or trees on the path to your home.  Use bright outdoor lighting.  Clear any walking paths of anything that might make someone trip, such as rocks or tools.  Regularly check to see if handrails are loose or broken. Make sure that both sides of any steps have handrails.  Any raised decks and porches should have guardrails on the edges.  Have any leaves, snow, or ice cleared regularly.  Use sand or salt on walking paths during winter.  Clean up any spills in your garage right away. This includes oil or grease spills. What can I do in the bathroom?  Use night lights.  Install grab bars by the toilet and in the tub and shower. Do not use towel bars as grab bars.  Use non-skid mats or decals in the tub or shower.  If you need to sit down in the shower, use a plastic, non-slip stool.  Keep the floor dry. Clean up any water that spills on the floor as soon as it happens.  Remove soap buildup in the tub or shower regularly.  Attach bath mats securely with double-sided  non-slip rug tape.  Do not have throw rugs and other things on the floor that can make you trip. What can I do in the bedroom?  Use night lights.  Make sure that you have a light by your bed that is easy to reach.  Do not use any sheets or blankets that are too big for your bed. They should not hang down onto the floor.  Have a firm chair that has side arms. You can use this for support while you get dressed.  Do not have throw rugs and other things on the floor that can make you trip. What can I do in the kitchen?  Clean up any spills right away.  Avoid walking on wet floors.  Keep items that you use a lot in easy-to-reach places.  If you need to reach something above you, use a strong step stool that has a grab bar.  Keep electrical cords out of the way.  Do not use floor polish or wax that makes floors slippery. If you must use wax, use non-skid floor wax.  Do not have throw rugs and other things on the floor that can make you trip. What can I do with my stairs?  Do not leave any items on the  stairs.  Make sure that there are handrails on both sides of the stairs and use them. Fix handrails that are broken or loose. Make sure that handrails are as long as the stairways.  Check any carpeting to make sure that it is firmly attached to the stairs. Fix any carpet that is loose or worn.  Avoid having throw rugs at the top or bottom of the stairs. If you do have throw rugs, attach them to the floor with carpet tape.  Make sure that you have a light switch at the top of the stairs and the bottom of the stairs. If you do not have them, ask someone to add them for you. What else can I do to help prevent falls?  Wear shoes that:  Do not have high heels.  Have rubber bottoms.  Are comfortable and fit you well.  Are closed at the toe. Do not wear sandals.  If you use a stepladder:  Make sure that it is fully opened. Do not climb a closed stepladder.  Make sure that both  sides of the stepladder are locked into place.  Ask someone to hold it for you, if possible.  Clearly mark and make sure that you can see:  Any grab bars or handrails.  First and last steps.  Where the edge of each step is.  Use tools that help you move around (mobility aids) if they are needed. These include:  Canes.  Walkers.  Scooters.  Crutches.  Turn on the lights when you go into a dark area. Replace any light bulbs as soon as they burn out.  Set up your furniture so you have a clear path. Avoid moving your furniture around.  If any of your floors are uneven, fix them.  If there are any pets around you, be aware of where they are.  Review your medicines with your doctor. Some medicines can make you feel dizzy. This can increase your chance of falling. Ask your doctor what other things that you can do to help prevent falls. This information is not intended to replace advice given to you by your health care provider. Make sure you discuss any questions you have with your health care provider. Document Released: 09/07/2009 Document Revised: 04/18/2016 Document Reviewed: 12/16/2014 Elsevier Interactive Patient Education  2017 [employer].

## 2020-11-27 ENCOUNTER — Telehealth: Payer: Self-pay | Admitting: Family Medicine

## 2020-11-27 MED ORDER — LOSARTAN POTASSIUM 25 MG PO TABS: 25.0000 mg | ORAL_TABLET | Freq: Every day | ORAL | 1 refills | Status: DC

## 2020-11-27 NOTE — Telephone Encounter (Signed)
[facility] mail order pharmacy requesting refill of losartan 25mg . Fax# [phone].

## 2020-11-27 NOTE — Telephone Encounter (Signed)
Spoke with patient to make sure that he wants to use [facility] since we just sent in to [facility].  He stated that this was correct.  Rx sent in.

## 2020-12-21 NOTE — Progress Notes (Signed)
Date:  12/26/2020   ID:  [name], DOB [birth year], MRN [id number]  PCP:  [name], DO  Cardiologist:   Johnsie Cancel Electrophysiologist:  None   Evaluation Performed:  Follow-Up Visit  Chief Complaint:  CAD  History of Present Illness:    [name] is a 75 y.o. . male who presents today for f/u  He has known CAD with prior LAD stent from 2007. He is on chronic DAPT with Plavix/aspirin.  Remote smoker. Other issues include CKD, Barrett's esophagus, COPD, DM, depression, HLD, HTN, prior GI bleed,   Significant depression on Welbutrin, Remeron and Trazodone   No angina   Still has waves of depression Married 20 years for 2nd time and not happy. Trying to eat better but BS still too high    The patient  does not have symptoms concerning for COVID-19 infection (fever, chills, cough, or new shortness of breath).    Past Medical History:  Diagnosis Date  . Achilles tendinitis of left lower extremity 07/20/2018  . Adenomatous colon polyp 2011  . Barrett esophagus 12/2013   EGD  . CAD (coronary artery disease)    ETT/Lexiscan-Myoview (11/14):  Normal; no ischemia, EF 67%  . CKD (chronic kidney disease), stage III ([place])   . Colon polyp 09/2010   adenoma  . COPD (chronic obstructive pulmonary disease) ([place]) 2009   spirometry with "moderate COPD"  . Depression   . Diabetes mellitus without complication ([place])   . Dyspnea   . GERD (gastroesophageal reflux disease)   . GI bleed    after polypectomy/admitted  . Hair loss 08/07/2018  . HTN (hypertension)   . Hypercholesterolemia   . Memory loss   . Myocardial infarction ([place])   . Obesity   . Sleep apnea   . Spontaneous pneumothorax   . Synovial cyst of right popliteal space 07/20/2018  . Vertigo    Past Surgical History:  Procedure Laterality Date  . APPENDECTOMY    . CORONARY ANGIOPLASTY WITH STENT PLACEMENT  2007   LAD  . PLEURAL SCARIFICATION  1987  . TONSILLECTOMY    . VARICOCELECTOMY       Current  Meds  Medication Sig  . aspirin EC 81 MG tablet Take 81 mg by mouth daily.  Marland Kitchen atorvastatin (LIPITOR) 80 MG tablet Take 1 tablet (80 mg total) by mouth daily.  Marland Kitchen buPROPion (WELLBUTRIN XL) 300 MG 24 hr tablet Take 1 tablet (300 mg total) by mouth daily.  . clopidogrel (PLAVIX) 75 MG tablet Take 1 tablet (75 mg total) by mouth daily. Please schedule yearly follow up visit for future refills [phone]  . Cyanocobalamin (VITAMIN B 12 PO) Take by mouth.  . diclofenac sodium (VOLTAREN) 1 % GEL Apply 4 g topically 4 (four) times daily. To affected joint.  . famotidine (PEPCID) 20 MG tablet Take 1 tablet (20 mg total) by mouth 2 (two) times daily.  . iron polysaccharides (NIFEREX) 150 MG capsule Take 1 capsule (150 mg total) by mouth daily.  Marland Kitchen losartan (COZAAR) 25 MG tablet Take 1 tablet (25 mg total) by mouth daily.  . metFORMIN (GLUCOPHAGE) 1000 MG tablet Take 1 tablet (1,000 mg total) by mouth daily with breakfast. diabetes  . metoprolol tartrate (LOPRESSOR) 50 MG tablet Take 1 tablet (50 mg total) by mouth daily.  . Multiple Vitamin (MULTIVITAMIN) tablet Take 1 tablet by mouth daily.  Marland Kitchen MYRBETRIQ 25 MG TB24 tablet   . nitroGLYCERIN (NITROSTAT) 0.4 MG SL tablet Place 1 tablet (  0.4 mg total) under the tongue every 5 (five) minutes as needed for chest pain (3 doses max).  . pantoprazole (PROTONIX) 40 MG tablet Take 1 tablet (40 mg total) by mouth 2 (two) times daily.  Marland Kitchen PARoxetine (PAXIL) 40 MG tablet Take 1 tablet (40 mg total) by mouth daily.  . SYMBICORT 160-4.5 MCG/ACT inhaler Inhale 2 puffs into the lungs 2 (two) times daily.  . tamsulosin (FLOMAX) 0.4 MG CAPS capsule   . traZODone (DESYREL) 50 MG tablet Take 3 tablets (150 mg total) by mouth at bedtime.     Allergies:   Altace [ramipril], Codeine, Remeron [mirtazapine], Tape, and Zantac [ranitidine hcl]   Social History   Tobacco Use  . Smoking status: Former Smoker    Types: Cigarettes    Quit date: 11/25/1998    Years since quitting:  22.1  . Smokeless tobacco: Never Used  Vaping Use  . Vaping Use: Never used  Substance Use Topics  . Alcohol use: No  . Drug use: No     Family Hx: The patient's family history includes CVA in his mother; Cancer in his sister; Cancer - Other in his brother; Colon polyps in his sister; Coronary artery disease (age of onset: 79) in his father; Dementia in his mother and sister; Heart disease in his father and mother; Hyperlipidemia in an other family member; Hypertension in an other family member; Stroke in his father.  ROS:   Please see the history of present illness.     All other systems reviewed and are negative.   Prior CV studies:   The following studies were reviewed today:  Myovue 2014   Labs/Other Tests and Data Reviewed:    EKG:  SR rate 58 normal 12/26/2020 NSR rate 66 nonspecific ST changes   Recent Labs: 03/10/2020: Magnesium 1.6 11/09/2020: ALT 26; BUN 19; Creatinine, Ser 1.35; Hemoglobin 13.1; Platelets 197.0; Potassium 4.5; Sodium 138; TSH 1.33   Recent Lipid Panel Lab Results  Component Value Date/Time   CHOL 129 11/09/2020 11:50 AM   TRIG 228.0 (H) 11/09/2020 11:50 AM   HDL 47.30 11/09/2020 11:50 AM   CHOLHDL 3 11/09/2020 11:50 AM   LDLCALC 50 08/07/2018 11:08 AM   LDLDIRECT 58.0 11/09/2020 11:50 AM    Wt Readings from Last 3 Encounters:  12/26/20 96.2 kg  11/22/20 94.8 kg  11/09/20 94.8 kg     Objective:    Vital Signs:  BP 132/84   Pulse 66   Ht 5\' 10"  (1.778 m)   Wt 96.2 kg   SpO2 98%   BMI 30.42 kg/m    Affect appropriate Healthy:  appears stated age 15: normal Neck supple with no adenopathy JVP normal no bruits no thyromegaly Lungs clear with no wheezing and good diaphragmatic motion Heart:  S1/S2 no murmur, no rub, gallop or click PMI normal Abdomen: benighn, BS positve, no tenderness, no AAA no bruit.  No HSM or HJR Distal pulses intact with no bruits No edema Neuro non-focal Skin warm and dry No muscular  weakness   ASSESSMENT & PLAN:    1. CAD with remote LAD stent - normal Myoview from 2014 - he is doing well clinically - no symptoms. Continue with CV risk factor modification.   2. HLD - on statin - labs with primary   3. HTN - Well controlled.  Continue current medications and low sodium Dash type diet.    4. DM - followed by PCP - last A1C was excellent. Discussed low carb diet A1c  6.7   5. COPD - stable shortness of breath. Continue Symbicort  6. Depression: significant on multiple meds f/u primary seen by neuro for memory loss noted polypharmacy with trazodone, Paxil , welbutrin   COVID-19 Education: The signs and symptoms of COVID-19 were discussed with the patient and how to seek care for testing (follow up with PCP or arrange E-visit).  The importance of social distancing was discussed today.  Time:   Today, I have spent 30 minutes with the patient with telehealth technology discussing the above problems.     Medication Adjustments/Labs and Tests Ordered: Current medicines are reviewed at length with the patient today.  Concerns regarding medicines are outlined above.   Tests Ordered:  None   Medication Changes:  None   Disposition:  Follow up in a year  Signed, Jenkins Rouge, MD  12/26/2020 3:34 PM    [place]

## 2020-12-26 ENCOUNTER — Other Ambulatory Visit: Payer: Self-pay

## 2020-12-26 ENCOUNTER — Ambulatory Visit: Payer: Medicare HMO | Admitting: Cardiovascular Disease

## 2020-12-26 ENCOUNTER — Encounter: Payer: Self-pay | Admitting: Cardiovascular Disease

## 2020-12-26 VITALS — BP 132/84 | HR 66 | Ht 70.0 in | Wt 212.0 lb

## 2020-12-26 DIAGNOSIS — E782 Mixed hyperlipidemia: Secondary | ICD-10-CM

## 2020-12-26 DIAGNOSIS — I2510 Atherosclerotic heart disease of native coronary artery without angina pectoris: Secondary | ICD-10-CM | POA: Diagnosis not present

## 2020-12-26 MED ORDER — CLOPIDOGREL BISULFATE 75 MG PO TABS: 75.0000 mg | ORAL_TABLET | Freq: Every day | ORAL | 3 refills | Status: DC

## 2020-12-26 NOTE — Patient Instructions (Signed)

## 2021-01-01 ENCOUNTER — Other Ambulatory Visit: Payer: Self-pay | Admitting: Family Medicine

## 2021-03-08 ENCOUNTER — Other Ambulatory Visit: Payer: Self-pay

## 2021-03-08 ENCOUNTER — Ambulatory Visit (INDEPENDENT_AMBULATORY_CARE_PROVIDER_SITE_OTHER): Payer: Medicare HMO | Admitting: Family Medicine

## 2021-03-08 ENCOUNTER — Encounter: Payer: Self-pay | Admitting: Family Medicine

## 2021-03-08 VITALS — BP 109/69 | HR 64 | Temp 98.6°F | Resp 16 | Ht 70.0 in | Wt 210.2 lb

## 2021-03-08 DIAGNOSIS — E1142 Type 2 diabetes mellitus with diabetic polyneuropathy: Secondary | ICD-10-CM

## 2021-03-08 DIAGNOSIS — E7800 Pure hypercholesterolemia, unspecified: Secondary | ICD-10-CM | POA: Diagnosis not present

## 2021-03-08 DIAGNOSIS — N183 Chronic kidney disease, stage 3 unspecified: Secondary | ICD-10-CM | POA: Diagnosis not present

## 2021-03-08 DIAGNOSIS — F3341 Major depressive disorder, recurrent, in partial remission: Secondary | ICD-10-CM

## 2021-03-08 DIAGNOSIS — E119 Type 2 diabetes mellitus without complications: Secondary | ICD-10-CM | POA: Diagnosis not present

## 2021-03-08 DIAGNOSIS — J449 Chronic obstructive pulmonary disease, unspecified: Secondary | ICD-10-CM | POA: Diagnosis not present

## 2021-03-08 DIAGNOSIS — F325 Major depressive disorder, single episode, in full remission: Secondary | ICD-10-CM

## 2021-03-08 LAB — POCT GLYCOSYLATED HEMOGLOBIN (HGB A1C)
HbA1c POC (<> result, manual entry): 6.8 % (ref 4.0–5.6)
HbA1c, POC (controlled diabetic range): 6.8 % (ref 0.0–7.0)
HbA1c, POC (prediabetic range): 6.8 % — AB (ref 5.7–6.4)
Hemoglobin A1C: 6.8 % — AB (ref 4.0–5.6)

## 2021-03-08 MED ORDER — PAROXETINE HCL 40 MG PO TABS: 40.0000 mg | ORAL_TABLET | Freq: Every day | ORAL | 1 refills | Status: DC

## 2021-03-08 MED ORDER — ATORVASTATIN CALCIUM 80 MG PO TABS: 80.0000 mg | ORAL_TABLET | Freq: Every day | ORAL | 3 refills | Status: DC

## 2021-03-08 MED ORDER — METFORMIN HCL 1000 MG PO TABS: 1000.0000 mg | ORAL_TABLET | Freq: Every day | ORAL | 1 refills | Status: DC

## 2021-03-08 MED ORDER — METOPROLOL TARTRATE 50 MG PO TABS: 50.0000 mg | ORAL_TABLET | Freq: Every day | ORAL | 1 refills | Status: DC

## 2021-03-08 MED ORDER — FAMOTIDINE 20 MG PO TABS: 20.0000 mg | ORAL_TABLET | Freq: Two times a day (BID) | ORAL | 3 refills | Status: DC

## 2021-03-08 MED ORDER — TRAZODONE HCL 50 MG PO TABS: 150.0000 mg | ORAL_TABLET | Freq: Every day | ORAL | 1 refills | Status: DC

## 2021-03-08 MED ORDER — BUPROPION HCL ER (XL) 300 MG PO TB24: 300.0000 mg | ORAL_TABLET | Freq: Every day | ORAL | 1 refills | Status: DC

## 2021-03-08 MED ORDER — AMLODIPINE BESYLATE 2.5 MG PO TABS: 2.5000 mg | ORAL_TABLET | Freq: Every day | ORAL | 3 refills | Status: DC

## 2021-03-08 NOTE — Patient Instructions (Addendum)
     Next appt end of September. Labs are due next visit- try to be fasting if you can .

## 2021-03-08 NOTE — Progress Notes (Signed)
[name] , 08/23/1946, 75 y.o., male MRN: [id number] Patient Care Team    Relationship Specialty Notifications Start End  [name], DO PCP - General Family Medicine  12/25/15   Josue Hector, MD PCP - Cardiology Cardiology Admissions 08/20/18   Brunetta Genera, MD Consulting Physician Hematology  09/03/16   Josue Hector, MD Consulting Physician Cardiology  09/03/16   Wilford Corner, MD Consulting Physician Gastroenterology  09/03/16   Melina Schools, OD  Optometry  09/03/16   Kathie Rhodes, MD (Inactive) Consulting Physician Urology  09/10/16   Hollar, Katharine Look, MD Referring Physician Dermatology  06/29/19     Chief Complaint  Patient presents with  . [facility]    Fasting     Subjective: [name] is a 75 y.o. male present for [facility] Recurrent major depressive disorder, in partial remission (HCC)/memory changes Patient feels that his depression is doing fine.  He reports compliance with trazodone 150 mg nightly, Paxil 40 mg daily and Wellbutrin 300 mg daily. Prior note: He was having some increase in symptoms, but also experiencing mild cognitive changes. .  Current medication regimen of trazodone, paxil and Wellbutrin. He denies negative side effects occasions.  He and his wife both have noticed he is having some memory changes which have him concerned- which has mildly improved since stopping Remeron and starting low dose paxil.  He reports both his mother and his sisters had dementia.  He feels he is becoming more forgetful.  His wife has told him he will talk about the same things that they had already talked about prior.  Gastroesophageal reflux disease, esophagitis presence not specified  EGD 03/2016 by Dr. Michail Sermon with signs of barrett's esophagus. Patient was tried on lower dose medication, and was unable to tolerate secondary to return of symptoms.  His symptoms are well controlled with protonix 40 BID and pepcid BID.   COPD ([place]) He uses Symbicort  daily, Spiriva needed during certain times of the year only. His pneumonia series is completed.  Symptoms are well controlled on Symbicort only  Atherosclerosis of native coronary artery of native heart without angina pectoris/Essential hypertension/HYPERCHOLESTEROLEMIA/History of MI (myocardial infarction)/IDA He has known CAD with prior LAD stent from 2007. Pt reports complaince with metoprolol 50 mg daily and losartan 25 mg Qd  Pt takes a daily baby ASA and Plavix. Pt is  prescribed statin and followed by Dr. Johnsie Cancel Cardiology. He has noticed an increase in his bleeding and bruising over the last few months.  Diet: Low-sodium RF: Hypertension, hyperlipidemia, diabetes  Type 2 diabetes mellitus without complication, without long-term current use of insulin ([place]) patient reports compliance with metformin 1000 mg Qd. Patient denies dizziness, hyperglycemic or hypoglycemic events. Patient denies numbness, tingling in the extremities or nonhealing wounds of feet.   Pt reports BG ranges not routinely checked.  Depression screen [facility] 2/9 03/08/2021 11/22/2020 11/09/2020 07/10/2020 03/10/2020  Decreased Interest 3 0 0 0 0  Down, Depressed, Hopeless 3 1 0 0 0  PHQ - 2 Score 6 1 0 0 0  Altered sleeping 3 - 3 0 0  Tired, decreased energy 0 - 3 0 3  Change in appetite 0 - 0 0 0  Feeling bad or failure about yourself  1 - 1 0 0  Trouble concentrating 2 - 1 0 1  Moving slowly or fidgety/restless 1 - 3 0 0  Suicidal thoughts 0 - 0 0 0  PHQ-9 Score 13 - 11 0  4  Difficult doing work/chores Somewhat difficult - - - Not difficult at all  Some recent data might be hidden   GAD 7 : Generalized Anxiety Score 03/08/2021 02/12/2019 12/25/2015  Nervous, Anxious, on Edge 3 0 2  Control/stop worrying 3 3 2   Worry too much - different things 2 3 3   Trouble relaxing 1 3 2   Restless 1 3 2   Easily annoyed or irritable 3 3 2   Afraid - awful might happen 0 0 0  Total GAD 7 Score 13 15 13   Anxiety Difficulty -  Somewhat difficult Somewhat difficult    Allergies  Allergen Reactions  . Altace [Ramipril] Cough  . Codeine     Makes sick  . Losartan Cough    cough  . Remeron [Mirtazapine] Other (See Comments)    Cognitive changes  . Tape     Blisters   . Zantac [Ranitidine Hcl] Dermatitis   Social History   Tobacco Use  . Smoking status: Former Smoker    Types: Cigarettes    Quit date: 11/25/1998    Years since quitting: 22.2  . Smokeless tobacco: Never Used  Substance Use Topics  . Alcohol use: No   Past Medical History:  Diagnosis Date  . Achilles tendinitis of left lower extremity 07/20/2018  . Adenomatous colon polyp 2011  . Barrett esophagus 12/2013   EGD  . CAD (coronary artery disease)    ETT/Lexiscan-Myoview (11/14):  Normal; no ischemia, EF 67%  . CKD (chronic kidney disease), stage III ([place])   . Colon polyp 09/2010   adenoma  . COPD (chronic obstructive pulmonary disease) ([place]) 2009   spirometry with "moderate COPD"  . Depression   . Diabetes mellitus without complication ([place])   . Dyspnea   . GERD (gastroesophageal reflux disease)   . GI bleed    after polypectomy/admitted  . Hair loss 08/07/2018  . HTN (hypertension)   . Hypercholesterolemia   . Memory loss   . Myocardial infarction ([place])   . Obesity   . Sleep apnea   . Spontaneous pneumothorax   . Synovial cyst of right popliteal space 07/20/2018  . Vertigo    Past Surgical History:  Procedure Laterality Date  . APPENDECTOMY    . CORONARY ANGIOPLASTY WITH STENT PLACEMENT  2007   LAD  . PLEURAL SCARIFICATION  1987  . TONSILLECTOMY    . VARICOCELECTOMY     Family History  Problem Relation Age of Onset  . Heart disease Mother   . CVA Mother   . Dementia Mother   . Coronary artery disease Father 63  . Heart disease Father   . Stroke Father   . Dementia Sister   . Cancer - Other Brother   . Hypertension Other   . Hyperlipidemia Other   . Colon polyps Sister   . Cancer Sister    Allergies as of  03/08/2021      Reactions   Altace [ramipril] Cough   Codeine    Makes sick   Losartan Cough   cough   Remeron [mirtazapine] Other (See Comments)   Cognitive changes   Tape    Blisters   Zantac [ranitidine Hcl] Dermatitis      Medication List       Accurate as of March 08, 2021 11:21 AM. If you have any questions, ask your nurse or doctor.        STOP taking these medications   aspirin EC 81 MG tablet Stopped by: Howard Pouch, DO  losartan 25 MG tablet Commonly known as: COZAAR Stopped by: Howard Pouch, DO     TAKE these medications   amLODipine 2.5 MG tablet Commonly known as: NORVASC Take 1 tablet (2.5 mg total) by mouth daily. Started by: Howard Pouch, DO   atorvastatin 80 MG tablet Commonly known as: LIPITOR Take 1 tablet (80 mg total) by mouth daily.   buPROPion 300 MG 24 hr tablet Commonly known as: WELLBUTRIN XL Take 1 tablet (300 mg total) by mouth daily.   clopidogrel 75 MG tablet Commonly known as: PLAVIX Take 1 tablet (75 mg total) by mouth daily.   diclofenac sodium 1 % Gel Commonly known as: VOLTAREN Apply 4 g topically 4 (four) times daily. To affected joint.   famotidine 20 MG tablet Commonly known as: Pepcid Take 1 tablet (20 mg total) by mouth 2 (two) times daily.   iron polysaccharides 150 MG capsule Commonly known as: NIFEREX Take 1 capsule (150 mg total) by mouth daily.   metFORMIN 1000 MG tablet Commonly known as: GLUCOPHAGE Take 1 tablet (1,000 mg total) by mouth daily with breakfast. diabetes   metoprolol tartrate 50 MG tablet Commonly known as: LOPRESSOR Take 1 tablet (50 mg total) by mouth daily.   multivitamin tablet Take 1 tablet by mouth daily.   Myrbetriq 25 MG Tb24 tablet Generic drug: mirabegron ER   nitroGLYCERIN 0.4 MG SL tablet Commonly known as: NITROSTAT Place 1 tablet (0.4 mg total) under the tongue every 5 (five) minutes as needed for chest pain (3 doses max).   pantoprazole 40 MG tablet Commonly  known as: PROTONIX Take 1 tablet (40 mg total) by mouth 2 (two) times daily.   PARoxetine 40 MG tablet Commonly known as: PAXIL Take 1 tablet (40 mg total) by mouth daily.   Symbicort 160-4.5 MCG/ACT inhaler Generic drug: budesonide-formoterol Inhale 2 puffs into the lungs 2 (two) times daily.   tamsulosin 0.4 MG Caps capsule Commonly known as: FLOMAX   traZODone 50 MG tablet Commonly known as: DESYREL Take 3 tablets (150 mg total) by mouth at bedtime.   VITAMIN B 12 PO Take by mouth.       No results found.   ROS: Negative, with the exception of above mentioned in HPI   Objective:  BP 109/69 (BP Location: Left Arm, Patient Position: Sitting, Cuff Size: Large)   Pulse 64   Temp 98.6 F (37 C) (Oral)   Resp 16   Ht 5\' 10"  (1.778 m)   Wt 210 lb 3.2 oz (95.3 kg)   SpO2 97%   BMI 30.16 kg/m  Body mass index is 30.16 kg/m. Gen: Afebrile. No acute distress. Nontoxic. Pleasant male.  HENT: AT. [place].  Eyes:Pupils Equal Round Reactive to light, Extraocular movements intact,  Conjunctiva without redness, discharge or icterus. Neck/lymp/endocrine: Supple,no lymphadenopathy, no thyromegaly CV: RRR no murmur, no edema, +2/4 P posterior tibialis pulses Chest: CTAB, no wheeze or crackles Skin: no rashes, purpura or petechiae.  Neuro:  Normal gait. PERLA. EOMi. Alert. Oriented x3 Psych: Normal affect, dress and demeanor. Normal speech. Normal thought content and judgment.    Results for orders placed or performed in visit on 03/08/21 (from the past 48 hour(s))  POCT glycosylated hemoglobin (Hb A1C)     Status: Abnormal   Collection Time: 03/08/21 10:59 AM  Result Value Ref Range   Hemoglobin A1C 6.8 (A) 4.0 - 5.6 %   HbA1c POC (<> result, manual entry) 6.8 4.0 - 5.6 %   HbA1c, POC (prediabetic range)  6.8 (A) 5.7 - 6.4 %   HbA1c, POC (controlled diabetic range) 6.8 0.0 - 7.0 %    Assessment/Plan: [name] is a 75 y.o. male present for OV for  Recurrent major  depressive disorder, in partial remission ([place]) - stable.  - continue paxil to 40 mg - DC'd  Remeron 30 mg QHS last visit secondary to reports of memory decline- which seemed to potentially start around remeron.  - continue trazodone 2-3 tabs QHS PRN - continue  wellbutrin 300 QD.  - Follow-up 5 months  Gastroesophageal reflux disease, esophagitis presence not specified Stable.  -continue Protonix 40 mg twice a day, was unable to take back secondary to recurrence of symptoms. -continue  Pepcid twice daily today  Chronic bronchitis, unspecified chronic bronchitis type ([place]) Stable.  -   Continue Symbicort when needed  Atherosclerosis of native coronary artery of native heart without angina pectoris/Essential hypertension/HYPERCHOLESTEROLEMIA/History of MI (myocardial infarction) Stage 3 chronic kidney disease/ iron deficiency anemia - stable- but losartan caused cough.  - following with cards, Dr. Johnsie Cancel he is making an appt> place referral back - continue  atorvastatin - continue metoprolol - stop losartan 25 mg daily> d/t cough. Start amlodipine 2.5 mg. -  plavix managed by cardio (stopped ASA) - low sodium diet and exercise encouraged.   Type 2 diabetes mellitus without complication, without long-term current use of insulin (HCC) - A1c today 5.8--> 6.0-->6.1>>6.7> 6.5> 7.1 >7.1> 6.7> 6.8  today.  - continue metformin to 1000 Qd> did not tolerate higher dose, if needing 2nd agent - Foot exam: 03/10/2020 - eye exam: 03/30/2019> referral to opht placed last visit.  - PNA: Series completed - Flu: UTD 2021- today , encouraged yearly - Urine Microalbumin w/creat. Ratio: completed 10/2020  Iron deficiency anemia: Currently on supplementation Iron panel UTD 10/2020  F/u [facility] 4 mos.    Reviewed expectations re: course of current medical issues.  Discussed self-management of symptoms.  Outlined signs and symptoms indicating need for more acute intervention.  Patient verbalized  understanding and all questions were answered.  Patient received an After-Visit Summary.     Orders Placed This Encounter  Procedures  . Ambulatory referral to Ophthalmology  . POCT glycosylated hemoglobin (Hb A1C)   Meds ordered this encounter  Medications  . buPROPion (WELLBUTRIN XL) 300 MG 24 hr tablet    Sig: Take 1 tablet (300 mg total) by mouth daily.    Dispense:  90 tablet    Refill:  1  . metFORMIN (GLUCOPHAGE) 1000 MG tablet    Sig: Take 1 tablet (1,000 mg total) by mouth daily with breakfast. diabetes    Dispense:  90 tablet    Refill:  1    Change in dose  . metoprolol tartrate (LOPRESSOR) 50 MG tablet    Sig: Take 1 tablet (50 mg total) by mouth daily.    Dispense:  90 tablet    Refill:  1  . PARoxetine (PAXIL) 40 MG tablet    Sig: Take 1 tablet (40 mg total) by mouth daily.    Dispense:  90 tablet    Refill:  1    DC prior doses. Remind him to only take one pill now. Thanks.  . traZODone (DESYREL) 50 MG tablet    Sig: Take 3 tablets (150 mg total) by mouth at bedtime.    Dispense:  270 tablet    Refill:  1  . amLODipine (NORVASC) 2.5 MG tablet    Sig: Take 1 tablet (2.5  mg total) by mouth daily.    Dispense:  90 tablet    Refill:  3    Discontinue losartan.  Please.  Marland Kitchen atorvastatin (LIPITOR) 80 MG tablet    Sig: Take 1 tablet (80 mg total) by mouth daily.    Dispense:  90 tablet    Refill:  3  . famotidine (PEPCID) 20 MG tablet    Sig: Take 1 tablet (20 mg total) by mouth 2 (two) times daily.    Dispense:  180 tablet    Refill:  3     electronically signed by:  Howard Pouch, DO  [place]

## 2021-04-19 DIAGNOSIS — N401 Enlarged prostate with lower urinary tract symptoms: Secondary | ICD-10-CM | POA: Diagnosis not present

## 2021-04-19 DIAGNOSIS — R3915 Urgency of urination: Secondary | ICD-10-CM | POA: Diagnosis not present

## 2021-04-19 DIAGNOSIS — N3000 Acute cystitis without hematuria: Secondary | ICD-10-CM | POA: Diagnosis not present

## 2021-05-05 ENCOUNTER — Other Ambulatory Visit: Payer: Self-pay | Admitting: Family Medicine

## 2021-05-09 ENCOUNTER — Other Ambulatory Visit: Payer: Self-pay

## 2021-05-09 ENCOUNTER — Ambulatory Visit (INDEPENDENT_AMBULATORY_CARE_PROVIDER_SITE_OTHER): Payer: Medicare HMO | Admitting: Family Medicine

## 2021-05-09 ENCOUNTER — Encounter: Payer: Self-pay | Admitting: Family Medicine

## 2021-05-09 VITALS — BP 136/77 | HR 67 | Temp 98.3°F | Ht 70.0 in | Wt 209.0 lb

## 2021-05-09 DIAGNOSIS — H9191 Unspecified hearing loss, right ear: Secondary | ICD-10-CM | POA: Diagnosis not present

## 2021-05-09 DIAGNOSIS — H6121 Impacted cerumen, right ear: Secondary | ICD-10-CM | POA: Diagnosis not present

## 2021-05-09 NOTE — Progress Notes (Signed)
This visit occurred during the SARS-CoV-2 public health emergency.  Safety protocols were in place, including screening questions prior to the visit, additional usage of staff PPE, and extensive cleaning of exam room while observing appropriate contact time as indicated for disinfecting solutions.    [name] , 05-Dec-1945, 75 y.o., male MRN: [id number] Patient Care Team    Relationship Specialty Notifications Start End  [name], DO PCP - General Family Medicine  12/25/15   Josue Hector, MD PCP - Cardiology Cardiology Admissions 08/20/18   Brunetta Genera, MD Consulting Physician Hematology  09/03/16   Josue Hector, MD Consulting Physician Cardiology  09/03/16   Wilford Corner, MD Consulting Physician Gastroenterology  09/03/16   Melina Schools, OD  Optometry  09/03/16   Kathie Rhodes, MD (Inactive) Consulting Physician Urology  09/10/16   Hollar, Katharine Look, MD Referring Physician Dermatology  06/29/19     Chief Complaint  Patient presents with   Ear Fullness    Pt c/o R ear fullness x 1 yr; pt seen audiology and informed him his ear is full of wax;      Subjective: Pt presents for an OV with complaints of right ear fullness for a little over a year that has been worsening.  He went to see his audiologist since he was having decreased hearing and they told him his ear was impacted with cerumen.  He is here today for evaluation.  He denies any tinnitus of his ears, but does endorse decreased hearing from his right ear.  He denies any ear pain.  Depression screen [facility] 2/9 05/09/2021 03/08/2021 11/22/2020 11/09/2020 07/10/2020  Decreased Interest 0 3 0 0 0  Down, Depressed, Hopeless 3 3 1  0 0  PHQ - 2 Score 3 6 1  0 0  Altered sleeping 3 3 - 3 0  Tired, decreased energy 0 0 - 3 0  Change in appetite 0 0 - 0 0  Feeling bad or failure about yourself  0 1 - 1 0  Trouble concentrating 1 2 - 1 0  Moving slowly or fidgety/restless 3 1 - 3 0  Suicidal thoughts 0 0  - 0 0  PHQ-9 Score 10 13 - 11 0  Difficult doing work/chores Somewhat difficult Somewhat difficult - - -  Some recent data might be hidden    Allergies  Allergen Reactions   Altace [Ramipril] Cough   Codeine     Makes sick   Losartan Cough    cough   Remeron [Mirtazapine] Other (See Comments)    Cognitive changes   Tape     Blisters    Zantac [Ranitidine Hcl] Dermatitis   Social History   Social History Narrative   Lives at home with wife. Married to [name].   Retired Production designer, theatre/television/film truck.    Drinks caffeine (3 cups coffee, 2 sodas per day),  Takes a daily vitamin, wears a seatbelt   Wears a bicycle helmet.    Has dentures (partial on top)   Smoke detector in the home. No firearms in the home.    Feels safe in relationships.     Right-handed.   Past Medical History:  Diagnosis Date   Achilles tendinitis of left lower extremity 07/20/2018   Adenomatous colon polyp 2011   Barrett esophagus 12/2013   EGD   CAD (coronary artery disease)    ETT/Lexiscan-Myoview (11/14):  Normal; no ischemia, EF 67%   CKD (chronic kidney disease), stage III ([place])  Colon polyp 09/2010   adenoma   COPD (chronic obstructive pulmonary disease) ([place]) 2009   spirometry with "moderate COPD"   Depression    Diabetes mellitus without complication (HCC)    Dyspnea    GERD (gastroesophageal reflux disease)    GI bleed    after polypectomy/admitted   Hair loss 08/07/2018   HTN (hypertension)    Hypercholesterolemia    Memory loss    Myocardial infarction ([place])    Obesity    Sleep apnea    Spontaneous pneumothorax    Synovial cyst of right popliteal space 07/20/2018   Vertigo    Past Surgical History:  Procedure Laterality Date   APPENDECTOMY     CORONARY ANGIOPLASTY WITH STENT PLACEMENT  2007   LAD   PLEURAL SCARIFICATION  1987   TONSILLECTOMY     VARICOCELECTOMY     Family History  Problem Relation Age of Onset   Heart disease Mother    CVA Mother    Dementia Mother    Coronary artery  disease Father 62   Heart disease Father    Stroke Father    Dementia Sister    Cancer - Other Brother    Hypertension Other    Hyperlipidemia Other    Colon polyps Sister    Cancer Sister    Allergies as of 05/09/2021       Reactions   Altace [ramipril] Cough   Codeine    Makes sick   Losartan Cough   cough   Remeron [mirtazapine] Other (See Comments)   Cognitive changes   Tape    Blisters   Zantac [ranitidine Hcl] Dermatitis        Medication List        Accurate as of May 09, 2021  1:03 PM. If you have any questions, ask your nurse or doctor.          amLODipine 2.5 MG tablet Commonly known as: NORVASC Take 1 tablet (2.5 mg total) by mouth daily.   atorvastatin 80 MG tablet Commonly known as: LIPITOR Take 1 tablet (80 mg total) by mouth daily.   buPROPion 300 MG 24 hr tablet Commonly known as: WELLBUTRIN XL Take 1 tablet (300 mg total) by mouth daily.   clopidogrel 75 MG tablet Commonly known as: PLAVIX Take 1 tablet (75 mg total) by mouth daily.   diclofenac sodium 1 % Gel Commonly known as: VOLTAREN Apply 4 g topically 4 (four) times daily. To affected joint.   famotidine 20 MG tablet Commonly known as: Pepcid Take 1 tablet (20 mg total) by mouth 2 (two) times daily.   iron polysaccharides 150 MG capsule Commonly known as: NIFEREX Take 1 capsule (150 mg total) by mouth daily.   metFORMIN 1000 MG tablet Commonly known as: GLUCOPHAGE Take 1 tablet (1,000 mg total) by mouth daily with breakfast. diabetes   metoprolol tartrate 50 MG tablet Commonly known as: LOPRESSOR Take 1 tablet (50 mg total) by mouth daily.   multivitamin tablet Take 1 tablet by mouth daily.   Myrbetriq 25 MG Tb24 tablet Generic drug: mirabegron ER   nitroGLYCERIN 0.4 MG SL tablet Commonly known as: NITROSTAT Place 1 tablet (0.4 mg total) under the tongue every 5 (five) minutes as needed for chest pain (3 doses max).   pantoprazole 40 MG tablet Commonly known  as: PROTONIX Take 1 tablet (40 mg total) by mouth 2 (two) times daily.   PARoxetine 40 MG tablet Commonly known as: PAXIL Take 1 tablet (40 mg  total) by mouth daily.   Symbicort 160-4.5 MCG/ACT inhaler Generic drug: budesonide-formoterol Inhale 2 puffs into the lungs 2 (two) times daily.   tamsulosin 0.4 MG Caps capsule Commonly known as: FLOMAX   traZODone 50 MG tablet Commonly known as: DESYREL Take 3 tablets (150 mg total) by mouth at bedtime.   VITAMIN B 12 PO Take by mouth.        All past medical history, surgical history, allergies, family history, immunizations andmedications were updated in the EMR today and reviewed under the history and medication portions of their EMR.     ROS: Negative, with the exception of above mentioned in HPI   Objective:  BP 136/77   Pulse 67   Temp 98.3 F (36.8 C) (Oral)   Ht 5\' 10"  (1.778 m)   Wt 209 lb (94.8 kg)   SpO2 95%   BMI 29.99 kg/m  Body mass index is 29.99 kg/m. Gen: Afebrile. No acute distress. Nontoxic in appearance, well developed, well nourished.  HENT: AT. [place].  Left TM visualized with normal tympanic membrane no erythema or bulging.  Normal EAC.  Right TM unable to be visualized secondary to cerumen within the canal.  No cough.  No hoarseness. Eyes:Pupils Equal Round Reactive to light, Extraocular movements intact,  Conjunctiva without redness, discharge or icterus. Neck/lymp/endocrine: Supple, no lymphadenopathy Neuro:  Normal gait. PERLA. EOMi. Alert. Oriented x3  Psych: Normal affect, dress and demeanor. Normal speech. Normal thought content and judgment.  No results found. No results found. No results found for this or any previous visit (from the past 24 hour(s)).  Assessment/Plan: CANTON YEARBY is a 75 y.o. male present for OV for  Hearing difficulty of right ear/Cerumen debris on tympanic membrane of right ear Procedure: Cerumen disimpaction Patient was verbally consented to  procedure. Water-peroxide solution was applied and gentle ear lavage performed on right ear(s).  There were no complications.  Tympanic membrane was visualized after disimpaction.  Tympanic membrane(s) intact.  Auditory canal(s) mild irritation, no drainage or bleeding..  Patient tolerated procedure well.  Patient reported relief of symptoms after removal of cerumen.   Reviewed expectations re: course of current medical issues. Discussed self-management of symptoms. Outlined signs and symptoms indicating need for more acute intervention. Patient verbalized understanding and all questions were answered. Patient received an After-Visit Summary.    No orders of the defined types were placed in this encounter.  No orders of the defined types were placed in this encounter.  Referral Orders  No referral(s) requested today     Note is dictated utilizing voice recognition software. Although note has been proof read prior to signing, occasional typographical errors still can be missed. If any questions arise, please do not hesitate to call for verification.   electronically signed by:  Howard Pouch, DO  [place]

## 2021-05-09 NOTE — Patient Instructions (Signed)
Earwax Buildup, Adult ?The ears produce a substance called earwax that helps keep bacteria out of the ear and protects the skin in the ear canal. Occasionally, earwax can build up in the ear and cause discomfort or hearing loss. ?What are the causes? ?This condition is caused by a buildup of earwax. Ear canals are self-cleaning. Ear wax is made in the outer part of the ear canal and generally falls out in small amounts over time. ?When the self-cleaning mechanism is not working, earwax builds up and can cause decreased hearing and discomfort. Attempting to clean ears with cotton swabs can push the earwax deep into the ear canal and cause decreased hearing and pain. ?What increases the risk? ?This condition is more likely to develop in people who: ?Clean their ears often with cotton swabs. ?Pick at their ears. ?Use earplugs or in-ear headphones often, or wear hearing aids. ?The following factors may also make you more likely to develop this condition: ?Being male. ?Being of older age. ?Naturally producing more earwax. ?Having narrow ear canals. ?Having earwax that is overly thick or sticky. ?Having excess hair in the ear canal. ?Having eczema. ?Being dehydrated. ?What are the signs or symptoms? ?Symptoms of this condition include: ?Reduced or muffled hearing. ?A feeling of fullness in the ear or feeling that the ear is plugged. ?Fluid coming from the ear. ?Ear pain or an itchy ear. ?Ringing in the ear. ?Coughing. ?Balance problems. ?An obvious piece of earwax that can be seen inside the ear canal. ?How is this diagnosed? ?This condition may be diagnosed based on: ?Your symptoms. ?Your medical history. ?An ear exam. During the exam, your health care provider will look into your ear with an instrument called an otoscope. ?You may have tests, including a hearing test. ?How is this treated? ?This condition may be treated by: ?Using ear drops to soften the earwax. ?Having the earwax removed by a health care provider. The  health care provider may: ?Flush the ear with water. ?Use an instrument that has a loop on the end (curette). ?Use a suction device. ?Having surgery to remove the wax buildup. This may be done in severe cases. ?Follow these instructions at home: ? ?Take over-the-counter and prescription medicines only as told by your health care provider. ?Do not put any objects, including cotton swabs, into your ear. You can clean the opening of your ear canal with a washcloth or facial tissue. ?Follow instructions from your health care provider about cleaning your ears. Do not overclean your ears. ?Drink enough fluid to keep your urine pale yellow. This will help to thin the earwax. ?Keep all follow-up visits as told. If earwax builds up in your ears often or if you use hearing aids, consider seeing your health care provider for routine, preventive ear cleanings. Ask your health care provider how often you should schedule your cleanings. ?If you have hearing aids, clean them according to instructions from the manufacturer and your health care provider. ?Contact a health care provider if: ?You have ear pain. ?You develop a fever. ?You have pus or other fluid coming from your ear. ?You have hearing loss. ?You have ringing in your ears that does not go away. ?You feel like the room is spinning (vertigo). ?Your symptoms do not improve with treatment. ?Get help right away if: ?You have bleeding from the affected ear. ?You have severe ear pain. ?Summary ?Earwax can build up in the ear and cause discomfort or hearing loss. ?The most common symptoms of this condition include   reduced or muffled hearing, a feeling of fullness in the ear, or feeling that the ear is plugged. ?This condition may be diagnosed based on your symptoms, your medical history, and an ear exam. ?This condition may be treated by using ear drops to soften the earwax or by having the earwax removed by a health care provider. ?Do not put any objects, including cotton  swabs, into your ear. You can clean the opening of your ear canal with a washcloth or facial tissue. ?This information is not intended to replace advice given to you by your health care provider. Make sure you discuss any questions you have with your health care provider. ?Document Revised: 02/29/2020 Document Reviewed: 02/29/2020 ?Elsevier Patient Education ? 2022 Elsevier Inc. ? ?

## 2021-06-04 ENCOUNTER — Telehealth: Payer: Self-pay

## 2021-06-04 NOTE — Telephone Encounter (Signed)
Received fax and phone call from [facility] stating they were needing new Rx for pt. Pt currently uses [facility] and [facility].

## 2021-06-07 ENCOUNTER — Other Ambulatory Visit: Payer: Self-pay

## 2021-06-07 DIAGNOSIS — F325 Major depressive disorder, single episode, in full remission: Secondary | ICD-10-CM

## 2021-06-18 DIAGNOSIS — R350 Frequency of micturition: Secondary | ICD-10-CM | POA: Diagnosis not present

## 2021-06-25 DIAGNOSIS — H11153 Pinguecula, bilateral: Secondary | ICD-10-CM | POA: Diagnosis not present

## 2021-06-25 DIAGNOSIS — H2513 Age-related nuclear cataract, bilateral: Secondary | ICD-10-CM | POA: Diagnosis not present

## 2021-06-25 DIAGNOSIS — E119 Type 2 diabetes mellitus without complications: Secondary | ICD-10-CM | POA: Diagnosis not present

## 2021-06-25 LAB — HM DIABETES EYE EXAM

## 2021-07-19 ENCOUNTER — Other Ambulatory Visit: Payer: Self-pay

## 2021-07-19 DIAGNOSIS — F325 Major depressive disorder, single episode, in full remission: Secondary | ICD-10-CM

## 2021-07-19 DIAGNOSIS — F3341 Major depressive disorder, recurrent, in partial remission: Secondary | ICD-10-CM

## 2021-07-19 MED ORDER — BUPROPION HCL ER (XL) 300 MG PO TB24: 300.0000 mg | ORAL_TABLET | Freq: Every day | ORAL | 0 refills | Status: DC

## 2021-07-19 MED ORDER — PAROXETINE HCL 40 MG PO TABS: 40.0000 mg | ORAL_TABLET | Freq: Every day | ORAL | 0 refills | Status: DC

## 2021-09-01 ENCOUNTER — Other Ambulatory Visit: Payer: Self-pay | Admitting: Family Medicine

## 2021-09-01 DIAGNOSIS — F325 Major depressive disorder, single episode, in full remission: Secondary | ICD-10-CM

## 2021-09-01 DIAGNOSIS — E119 Type 2 diabetes mellitus without complications: Secondary | ICD-10-CM

## 2021-09-01 DIAGNOSIS — F3341 Major depressive disorder, recurrent, in partial remission: Secondary | ICD-10-CM

## 2021-09-06 ENCOUNTER — Other Ambulatory Visit: Payer: Self-pay

## 2021-09-07 ENCOUNTER — Other Ambulatory Visit: Payer: Self-pay | Admitting: Family Medicine

## 2021-09-07 ENCOUNTER — Ambulatory Visit (INDEPENDENT_AMBULATORY_CARE_PROVIDER_SITE_OTHER): Payer: Medicare HMO | Admitting: Family Medicine

## 2021-09-07 ENCOUNTER — Encounter: Payer: Self-pay | Admitting: Family Medicine

## 2021-09-07 VITALS — BP 133/74 | HR 65 | Temp 98.1°F | Ht 70.0 in | Wt 211.0 lb

## 2021-09-07 DIAGNOSIS — I10 Essential (primary) hypertension: Secondary | ICD-10-CM | POA: Diagnosis not present

## 2021-09-07 DIAGNOSIS — D6869 Other thrombophilia: Secondary | ICD-10-CM

## 2021-09-07 DIAGNOSIS — E785 Hyperlipidemia, unspecified: Secondary | ICD-10-CM

## 2021-09-07 DIAGNOSIS — I2510 Atherosclerotic heart disease of native coronary artery without angina pectoris: Secondary | ICD-10-CM | POA: Diagnosis not present

## 2021-09-07 DIAGNOSIS — N1831 Chronic kidney disease, stage 3a: Secondary | ICD-10-CM | POA: Diagnosis not present

## 2021-09-07 DIAGNOSIS — E1169 Type 2 diabetes mellitus with other specified complication: Secondary | ICD-10-CM | POA: Diagnosis not present

## 2021-09-07 DIAGNOSIS — G319 Degenerative disease of nervous system, unspecified: Secondary | ICD-10-CM

## 2021-09-07 DIAGNOSIS — E7800 Pure hypercholesterolemia, unspecified: Secondary | ICD-10-CM | POA: Diagnosis not present

## 2021-09-07 DIAGNOSIS — K219 Gastro-esophageal reflux disease without esophagitis: Secondary | ICD-10-CM

## 2021-09-07 DIAGNOSIS — F325 Major depressive disorder, single episode, in full remission: Secondary | ICD-10-CM

## 2021-09-07 DIAGNOSIS — I6782 Cerebral ischemia: Secondary | ICD-10-CM

## 2021-09-07 DIAGNOSIS — E119 Type 2 diabetes mellitus without complications: Secondary | ICD-10-CM

## 2021-09-07 DIAGNOSIS — Z1211 Encounter for screening for malignant neoplasm of colon: Secondary | ICD-10-CM

## 2021-09-07 DIAGNOSIS — I252 Old myocardial infarction: Secondary | ICD-10-CM

## 2021-09-07 DIAGNOSIS — E1142 Type 2 diabetes mellitus with diabetic polyneuropathy: Secondary | ICD-10-CM

## 2021-09-07 DIAGNOSIS — I679 Cerebrovascular disease, unspecified: Secondary | ICD-10-CM

## 2021-09-07 DIAGNOSIS — Z23 Encounter for immunization: Secondary | ICD-10-CM

## 2021-09-07 DIAGNOSIS — N179 Acute kidney failure, unspecified: Secondary | ICD-10-CM

## 2021-09-07 DIAGNOSIS — F330 Major depressive disorder, recurrent, mild: Secondary | ICD-10-CM

## 2021-09-07 DIAGNOSIS — F3341 Major depressive disorder, recurrent, in partial remission: Secondary | ICD-10-CM

## 2021-09-07 LAB — BASIC METABOLIC PANEL
BUN: 17 mg/dL (ref 6–23)
CO2: 27 mEq/L (ref 19–32)
Calcium: 9.3 mg/dL (ref 8.4–10.5)
Chloride: 100 mEq/L (ref 96–112)
Creatinine, Ser: 1.31 mg/dL (ref 0.40–1.50)
GFR: 53.85 mL/min — ABNORMAL LOW (ref >60.00)
Glucose, Bld: 198 mg/dL — ABNORMAL HIGH (ref 70–99)
Potassium: 4.2 mEq/L (ref 3.5–5.1)
Sodium: 137 mEq/L (ref 135–145)

## 2021-09-07 LAB — MICROALBUMIN / CREATININE URINE RATIO
Creatinine,U: 156.3 mg/dL
Microalb Creat Ratio: 0.4 mg/g (ref 0.0–30.0)
Microalb, Ur: <0.7 mg/dL (ref 0.0–1.9)

## 2021-09-07 LAB — POCT GLYCOSYLATED HEMOGLOBIN (HGB A1C)
HbA1c POC (<> result, manual entry): 7.8 % (ref 4.0–5.6)
HbA1c, POC (controlled diabetic range): 7.8 % — AB (ref 0.0–7.0)
HbA1c, POC (prediabetic range): 7.8 % — AB (ref 5.7–6.4)
Hemoglobin A1C: 7.8 % — AB (ref 4.0–5.6)

## 2021-09-07 LAB — VITAMIN D 25 HYDROXY (VIT D DEFICIENCY, FRACTURES): VITD: 37.24 ng/mL (ref 30.00–100.00)

## 2021-09-07 MED ORDER — BUPROPION HCL ER (XL) 300 MG PO TB24: 300.0000 mg | ORAL_TABLET | Freq: Every day | ORAL | 1 refills | Status: DC

## 2021-09-07 MED ORDER — PANTOPRAZOLE SODIUM 40 MG PO TBEC: 40.0000 mg | DELAYED_RELEASE_TABLET | Freq: Two times a day (BID) | ORAL | 3 refills | Status: DC

## 2021-09-07 MED ORDER — ATORVASTATIN CALCIUM 80 MG PO TABS: 80.0000 mg | ORAL_TABLET | Freq: Every day | ORAL | 3 refills | Status: DC

## 2021-09-07 MED ORDER — EMPAGLIFLOZIN 10 MG PO TABS: 10.0000 mg | ORAL_TABLET | Freq: Every day | ORAL | 1 refills | Status: DC

## 2021-09-07 MED ORDER — FAMOTIDINE 20 MG PO TABS: 20.0000 mg | ORAL_TABLET | Freq: Two times a day (BID) | ORAL | 3 refills | Status: DC

## 2021-09-07 MED ORDER — METOPROLOL TARTRATE 50 MG PO TABS: 50.0000 mg | ORAL_TABLET | Freq: Every day | ORAL | 1 refills | Status: DC

## 2021-09-07 MED ORDER — TRAZODONE HCL 50 MG PO TABS: 150.0000 mg | ORAL_TABLET | Freq: Every day | ORAL | 1 refills | Status: DC

## 2021-09-07 MED ORDER — PAROXETINE HCL 40 MG PO TABS: 40.0000 mg | ORAL_TABLET | Freq: Every day | ORAL | 1 refills | Status: DC

## 2021-09-07 MED ORDER — AMLODIPINE BESYLATE 2.5 MG PO TABS: 2.5000 mg | ORAL_TABLET | Freq: Every day | ORAL | 1 refills | Status: DC

## 2021-09-07 MED ORDER — METFORMIN HCL 1000 MG PO TABS: ORAL_TABLET | ORAL | 1 refills | Status: DC

## 2021-09-07 NOTE — Patient Instructions (Addendum)
Great to see you today.  I have refilled the medication(s) we provide.   If labs were collected, we will inform you of lab results once received either by echart message or telephone call.   - echart message- for normal results that have been seen by the patient already.   - telephone call: abnormal results or if patient has not viewed results in their echart.  Only changes to meds today: added [name] for your diabetes.  A1c increased to 7.8  Next appt in 4 months .   Diabetes Mellitus and Foot Care Foot care is an important part of your health, especially when you have diabetes. Diabetes may cause you to have problems because of poor blood flow (circulation) to your feet and legs, which can cause your skin to: Become thinner and drier. Break more easily. Heal more slowly. Peel and crack. You may also have nerve damage (neuropathy) in your legs and feet, causing decreased feeling in them. This means that you may not notice minor injuries to your feet that could lead to more serious problems. Noticing and addressing any potential problems early is the best way to prevent future foot problems. How to care for your feet Foot hygiene  Wash your feet daily with warm water and mild soap. Do not use hot water. Then, pat your feet and the areas between your toes until they are completely dry. Do not soak your feet as this can dry your skin. Trim your toenails straight across. Do not dig under them or around the cuticle. File the edges of your nails with an emery board or nail file. Apply a moisturizing lotion or petroleum jelly to the skin on your feet and to dry, brittle toenails. Use lotion that does not contain alcohol and is unscented. Do not apply lotion between your toes. Shoes and socks Wear clean socks or stockings every day. Make sure they are not too tight. Do not wear knee-high stockings since they may decrease blood flow to your legs. Wear shoes that fit properly and have enough  cushioning. Always look in your shoes before you put them on to be sure there are no objects inside. To break in new shoes, wear them for just a few hours a day. This prevents injuries on your feet. Wounds, scrapes, corns, and calluses  Check your feet daily for blisters, cuts, bruises, sores, and redness. If you cannot see the bottom of your feet, use a mirror or ask someone for help. Do not cut corns or calluses or try to remove them with medicine. If you find a minor scrape, cut, or break in the skin on your feet, keep it and the skin around it clean and dry. You may clean these areas with mild soap and water. Do not clean the area with peroxide, alcohol, or iodine. If you have a wound, scrape, corn, or callus on your foot, look at it several times a day to make sure it is healing and not infected. Check for: Redness, swelling, or pain. Fluid or blood. Warmth. Pus or a bad smell. General tips Do not cross your legs. This may decrease blood flow to your feet. Do not use heating pads or hot water bottles on your feet. They may burn your skin. If you have lost feeling in your feet or legs, you may not know this is happening until it is too late. Protect your feet from hot and cold by wearing shoes, such as at the beach or on hot pavement.  Schedule a complete foot exam at least once a year (annually) or more often if you have foot problems. Report any cuts, sores, or bruises to your health care provider immediately. Where to find more information American Diabetes Association: www.diabetes.org Association of Diabetes Care & Education Specialists: www.diabeteseducator.org Contact a health care provider if: You have a medical condition that increases your risk of infection and you have any cuts, sores, or bruises on your feet. You have an injury that is not healing. You have redness on your legs or feet. You feel burning or tingling in your legs or feet. You have pain or cramps in your legs and  feet. Your legs or feet are numb. Your feet always feel cold. You have pain around any toenails. Get help right away if: You have a wound, scrape, corn, or callus on your foot and: You have pain, swelling, or redness that gets worse. You have fluid or blood coming from the wound, scrape, corn, or callus. Your wound, scrape, corn, or callus feels warm to the touch. You have pus or a bad smell coming from the wound, scrape, corn, or callus. You have a fever. You have a red line going up your leg. Summary Check your feet every day for blisters, cuts, bruises, sores, and redness. Apply a moisturizing lotion or petroleum jelly to the skin on your feet and to dry, brittle toenails. Wear shoes that fit properly and have enough cushioning. If you have foot problems, report any cuts, sores, or bruises to your health care provider immediately. Schedule a complete foot exam at least once a year (annually) or more often if you have foot problems. This information is not intended to replace advice given to you by your health care provider. Make sure you discuss any questions you have with your health care provider. Document Revised: 06/01/2020 Document Reviewed: 06/01/2020 Elsevier Patient Education  [place].

## 2021-09-07 NOTE — Progress Notes (Signed)
[name] , 05/26/1946, 75 y.o., male MRN: [id number] Patient Care Team    Relationship Specialty Notifications Start End  [name], DO PCP - General Family Medicine  12/25/15   Josue Hector, MD PCP - Cardiology Cardiology Admissions 08/20/18   Brunetta Genera, MD Consulting Physician Hematology  09/03/16   Josue Hector, MD Consulting Physician Cardiology  09/03/16   Wilford Corner, MD Consulting Physician Gastroenterology  09/03/16   Melina Schools, OD  Optometry  09/03/16   Kathie Rhodes, MD (Inactive) Consulting Physician Urology  09/10/16   Hollar, Katharine Look, MD Referring Physician Dermatology  06/29/19     Chief Complaint  Patient presents with   Diabetes    [facility]; pt is not fasting    Subjective: [name] is a 75 y.o. male present for [facility] Recurrent major depressive disorder, in partial remission (HCC)/memory changes Patient feels that his depression is doing well.  He reports compliance with trazodone 150 mg nightly, Paxil 40 mg daily and Wellbutrin 300 mg daily. Prior note: He was having some increase in symptoms, but also experiencing mild cognitive changes. .  Current medication regimen of trazodone, paxil and Wellbutrin. He denies negative side effects occasions.  He and his wife both have noticed he is having some memory changes which have him concerned- which has mildly improved since stopping Remeron and starting low dose paxil.  He reports both his mother and his sisters had dementia.  He feels he is becoming more forgetful.  His wife has told him he will talk about the same things that they had already talked about prior.  Gastroesophageal reflux disease, esophagitis presence not specified  EGD 03/2016 by Dr. Michail Sermon with signs of barrett's esophagus. Patient was tried on lower dose medication, and was unable to tolerate secondary to return of symptoms.  His symptoms are well controlled with protonix 40 BID and pepcid BID.   COPD ([place]) He  uses Symbicort daily, Spiriva needed during certain times of the year only. His pneumonia series is completed.  Symptoms are well controlled on Symbicort only as needed now.  Atherosclerosis of native coronary artery of native heart without angina pectoris/Essential hypertension/HYPERCHOLESTEROLEMIA/History of MI (myocardial infarction)/IDA He has known CAD with prior LAD stent from 2007. Pt reports compliance with metoprolol 50 mg daily and losartan 25 mg Qd  Pt takes a daily baby ASA and Plavix. Pt is  prescribed statin and followed by Dr. Johnsie Cancel Cardiology. He has noticed an increase in his bleeding and bruising over the last few months.  Diet: Low-sodium RF: Hypertension, hyperlipidemia, diabetes  Type 2 diabetes mellitus without complication, without long-term current use of insulin ([place]) patient reports compliance with metformin 1000 mg Qd.Patient denies dizziness, hyperglycemic or hypoglycemic events. Patient denies numbness, tingling in the extremities or nonhealing wounds of feet.    Pt reports BG ranges not routinely checked.  Depression screen [facility] 2/9 09/07/2021 05/09/2021 03/08/2021 11/22/2020 11/09/2020  Decreased Interest 3 0 3 0 0  Down, Depressed, Hopeless 3 3 3 1  0  PHQ - 2 Score 6 3 6 1  0  Altered sleeping 2 3 3  - 3  Tired, decreased energy 3 0 0 - 3  Change in appetite 2 0 0 - 0  Feeling bad or failure about yourself  0 0 1 - 1  Trouble concentrating 2 1 2  - 1  Moving slowly or fidgety/restless 0 3 1 - 3  Suicidal thoughts 0 0 0 - 0  PHQ-9 Score 15 10 13  - 11  Difficult doing work/chores - Somewhat difficult Somewhat difficult - -  Some recent data might be hidden   GAD 7 : Generalized Anxiety Score 09/07/2021 03/08/2021 02/12/2019 12/25/2015  Nervous, Anxious, on Edge 2 3 0 2  Control/stop worrying 2 3 3 2   Worry too much - different things 2 2 3 3   Trouble relaxing 1 1 3 2   Restless 1 1 3 2   Easily annoyed or irritable 1 3 3 2   Afraid - awful might happen 0 0 0 0   Total GAD 7 Score 9 13 15 13   Anxiety Difficulty - - Somewhat difficult Somewhat difficult    Allergies  Allergen Reactions   Altace [Ramipril] Cough   Codeine     Makes sick   Losartan Cough    cough   Remeron [Mirtazapine] Other (See Comments)    Cognitive changes   Tape     Blisters    Zantac [Ranitidine Hcl] Dermatitis   Social History   Tobacco Use   Smoking status: Former    Types: Cigarettes    Quit date: 11/25/1998    Years since quitting: 22.8   Smokeless tobacco: Never  Substance Use Topics   Alcohol use: No   Past Medical History:  Diagnosis Date   Achilles tendinitis of left lower extremity 07/20/2018   Adenomatous colon polyp 2011   Barrett esophagus 12/2013   EGD   CAD (coronary artery disease)    ETT/Lexiscan-Myoview (11/14):  Normal; no ischemia, EF 67%   CKD (chronic kidney disease), stage III (HCC)    Colon polyp 09/2010   adenoma   COPD (chronic obstructive pulmonary disease) ([place]) 2009   spirometry with "moderate COPD"   Depression    Diabetes mellitus without complication (HCC)    Dyspnea    GERD (gastroesophageal reflux disease)    GI bleed    after polypectomy/admitted   Hair loss 08/07/2018   HTN (hypertension)    Hypercholesterolemia    Memory loss    Myocardial infarction ([place])    Obesity    Sleep apnea    Spontaneous pneumothorax    Synovial cyst of right popliteal space 07/20/2018   Vertigo    Past Surgical History:  Procedure Laterality Date   APPENDECTOMY     CORONARY ANGIOPLASTY WITH STENT PLACEMENT  2007   LAD   PLEURAL SCARIFICATION  1987   TONSILLECTOMY     VARICOCELECTOMY     Family History  Problem Relation Age of Onset   Heart disease Mother    CVA Mother    Dementia Mother    Coronary artery disease Father 63   Heart disease Father    Stroke Father    Dementia Sister    Cancer - Other Brother    Hypertension Other    Hyperlipidemia Other    Colon polyps Sister    Cancer Sister    Allergies as of  09/07/2021       Reactions   Altace [ramipril] Cough   Codeine    Makes sick   Losartan Cough   cough   Remeron [mirtazapine] Other (See Comments)   Cognitive changes   Tape    Blisters   Zantac [ranitidine Hcl] Dermatitis        Medication List        Accurate as of September 07, 2021 11:59 PM. If you have any questions, ask your nurse or doctor.  STOP taking these medications    diclofenac sodium 1 % Gel Commonly known as: VOLTAREN Stopped by: Howard Pouch, DO       TAKE these medications    amLODipine 2.5 MG tablet Commonly known as: NORVASC Take 1 tablet (2.5 mg total) by mouth daily.   atorvastatin 80 MG tablet Commonly known as: LIPITOR Take 1 tablet (80 mg total) by mouth daily.   buPROPion 300 MG 24 hr tablet Commonly known as: WELLBUTRIN XL Take 1 tablet (300 mg total) by mouth daily.   clopidogrel 75 MG tablet Commonly known as: PLAVIX Take 1 tablet (75 mg total) by mouth daily.   empagliflozin 10 MG Tabs tablet Commonly known as: [name] Take 1 tablet (10 mg total) by mouth daily before breakfast. Started by: Howard Pouch, DO   famotidine 20 MG tablet Commonly known as: Pepcid Take 1 tablet (20 mg total) by mouth 2 (two) times daily.   iron polysaccharides 150 MG capsule Commonly known as: NIFEREX Take 1 capsule (150 mg total) by mouth daily.   metFORMIN 1000 MG tablet Commonly known as: GLUCOPHAGE TAKE 1 TABLET (1,000 MG TOTAL) BY MOUTH DAILY WITH BREAKFAST. What changed: See the new instructions. Changed by: Howard Pouch, DO   metoprolol tartrate 50 MG tablet Commonly known as: LOPRESSOR Take 1 tablet (50 mg total) by mouth daily.   multivitamin tablet Take 1 tablet by mouth daily.   Myrbetriq 25 MG Tb24 tablet Generic drug: mirabegron ER   nitroGLYCERIN 0.4 MG SL tablet Commonly known as: NITROSTAT Place 1 tablet (0.4 mg total) under the tongue every 5 (five) minutes as needed for chest pain (3 doses max).    pantoprazole 40 MG tablet Commonly known as: PROTONIX Take 1 tablet (40 mg total) by mouth 2 (two) times daily.   PARoxetine 40 MG tablet Commonly known as: PAXIL Take 1 tablet (40 mg total) by mouth daily.   Symbicort 160-4.5 MCG/ACT inhaler Generic drug: budesonide-formoterol INHALE 2 PUFFS TWICE DAILY   tamsulosin 0.4 MG Caps capsule Commonly known as: FLOMAX   traZODone 50 MG tablet Commonly known as: DESYREL Take 3 tablets (150 mg total) by mouth at bedtime.   VITAMIN B 12 PO Take by mouth.        No results found.   ROS: Negative, with the exception of above mentioned in HPI   Objective:  BP 133/74   Pulse 65   Temp 98.1 F (36.7 C) (Oral)   Ht 5\' 10"  (1.778 m)   Wt 211 lb (95.7 kg)   SpO2 96%   BMI 30.28 kg/m  Body mass index is 30.28 kg/m. Gen: Afebrile. No acute distress.  Nontoxic, pleasant male. HENT: AT. [place].  No cough.  No hoarseness. Eyes:Pupils Equal Round Reactive to light, Extraocular movements intact,  Conjunctiva without redness, discharge or icterus. Neck/lymp/endocrine: Supple, no lymphadenopathy, no thyromegaly CV: RRR no murmur, no edema, +2/4 P posterior tibialis pulses Chest: CTAB, no wheeze or crackles Skin: No rashes, purpura or petechiae.  Neuro:  Normal gait. PERLA. EOMi. Alert. Oriented x3 Psych: Normal affect, dress and demeanor. Normal speech. Normal thought content and judgment.    No results found for this or any previous visit (from the past 48 hour(s)).   Assessment/Plan: [name] is a 75 y.o. male present for OV for  Recurrent major depressive disorder, in partial remission (HCC) -Stable -Continue paxil to 40 mg -Continue trazodone 2-3 tabs QHS PRN -Continue wellbutrin 300 QD. -Tried meds: Remeron (possible memory changes) -  Follow-up 4 months  Colon cancer screening - Ambulatory referral to Gastroenterology  Gastroesophageal reflux disease, esophagitis presence not specified Stable -Continue Protonix  40 mg twice a day, was unable to take back secondary to recurrence of symptoms. Continue Pepcid twice daily today Vitamin D levels collected today  Chronic bronchitis, unspecified chronic bronchitis type ([place]) Stable.  -   Continue Symbicort when needed  Atherosclerosis of native coronary artery of native heart without angina pectoris/Essential hypertension/HYPERCHOLESTEROLEMIA/History of MI (myocardial infarction) Stage 3 chronic kidney disease/ iron deficiency anemia/acquired thrombophilia -Stable but losartan caused cough.  - following with cards, Dr. Johnsie Cancel. -Continue atorvastatin -Continue metoprolol - stop losartan 25 mg daily> d/t cough.  Continue amlodipine 2.5 mg. Vitamin D levels collected today -  plavix managed by cardio (stopped ASA) - low sodium diet and exercise encouraged.   Type 2 diabetes mellitus with hyperlipidemia - A1c today 5.8--> 6.0-->6.1>>6.7> 6.5> 7.1 >7.1> 6.7> 6.8 >7.8 today.  -Continue metformin to 1000 Qd> did not tolerate higher dose, -Start [name] 10 mg daily  Continue atorvastatin 80 - Foot exam: 09/07/2021 - eye exam: Completed 06/25/2021-patient source [place] - PNA: Series completed - Flu: UTD 2022- today, encouraged yearly - Urine Microalbumin w/creat. Ratio: completed 09/07/2021 -BMP, A1c collected today  Cerebral atrophy/microvascular ischemic occlusive disease Patient is having mild cognitive changes. Referral had been placed to neurology Patient is on a statin  Iron deficiency anemia: Currently on supplementation Iron panel UTD   Influenza vaccine administered today  F/u [facility] 4 mos.   Reviewed expectations re: course of current medical issues. Discussed self-management of symptoms. Outlined signs and symptoms indicating need for more acute intervention. Patient verbalized understanding and all questions were answered. Patient received an After-Visit Summary.    Orders Placed This Encounter  Procedures   Flu Vaccine  QUAD High Dose(Fluad)   Basic Metabolic Panel (BMET)   Urine Microalbumin w/creat. ratio   Vitamin D (25 hydroxy)   Ambulatory referral to Gastroenterology   POCT HgB A1C   Meds ordered this encounter  Medications   buPROPion (WELLBUTRIN XL) 300 MG 24 hr tablet    Sig: Take 1 tablet (300 mg total) by mouth daily.    Dispense:  90 tablet    Refill:  1   metFORMIN (GLUCOPHAGE) 1000 MG tablet    Sig: TAKE 1 TABLET (1,000 MG TOTAL) BY MOUTH DAILY WITH BREAKFAST.    Dispense:  90 tablet    Refill:  1   pantoprazole (PROTONIX) 40 MG tablet    Sig: Take 1 tablet (40 mg total) by mouth 2 (two) times daily.    Dispense:  180 tablet    Refill:  3   PARoxetine (PAXIL) 40 MG tablet    Sig: Take 1 tablet (40 mg total) by mouth daily.    Dispense:  90 tablet    Refill:  1   traZODone (DESYREL) 50 MG tablet    Sig: Take 3 tablets (150 mg total) by mouth at bedtime.    Dispense:  270 tablet    Refill:  1   empagliflozin ([name]) 10 MG TABS tablet    Sig: Take 1 tablet (10 mg total) by mouth daily before breakfast.    Dispense:  90 tablet    Refill:  1   amLODipine (NORVASC) 2.5 MG tablet    Sig: Take 1 tablet (2.5 mg total) by mouth daily.    Dispense:  90 tablet    Refill:  1   atorvastatin (LIPITOR) 80 MG tablet  Sig: Take 1 tablet (80 mg total) by mouth daily.    Dispense:  90 tablet    Refill:  3   famotidine (PEPCID) 20 MG tablet    Sig: Take 1 tablet (20 mg total) by mouth 2 (two) times daily.    Dispense:  180 tablet    Refill:  3   metoprolol tartrate (LOPRESSOR) 50 MG tablet    Sig: Take 1 tablet (50 mg total) by mouth daily.    Dispense:  90 tablet    Refill:  1    MUST HAVE OV FOR FURTHER REFILLS      electronically signed by:  Howard Pouch, DO  [place]

## 2021-09-12 DIAGNOSIS — D6869 Other thrombophilia: Secondary | ICD-10-CM | POA: Insufficient documentation

## 2021-09-24 ENCOUNTER — Other Ambulatory Visit: Payer: Self-pay | Admitting: Family Medicine

## 2021-09-24 DIAGNOSIS — E1169 Type 2 diabetes mellitus with other specified complication: Secondary | ICD-10-CM

## 2021-09-24 DIAGNOSIS — E785 Hyperlipidemia, unspecified: Secondary | ICD-10-CM

## 2021-10-01 ENCOUNTER — Telehealth: Payer: Self-pay | Admitting: Family Medicine

## 2021-10-01 ENCOUNTER — Other Ambulatory Visit: Payer: Self-pay | Admitting: Family Medicine

## 2021-10-01 NOTE — Telephone Encounter (Signed)
Pt called stating he received empagliflozin ([name]) 10 MG TABS tablet  and he doesn't remember having discussed this medication with Dr. Raoul Pitch. He said it is a very expensive medication and [facility] pharmacy charged him over $500. He said they reluctantly took it back and credited him.   He is wanting call back at [phone]

## 2021-10-01 NOTE — Telephone Encounter (Signed)
Patient wants call back on his cell phone.  He will be leaving the house and will not be able on house phone.  Please call [phone]

## 2021-10-01 NOTE — Telephone Encounter (Signed)
Spoke with pt regarding medication. Pt will call insurance company and ask whats on formulary list.

## 2021-11-28 ENCOUNTER — Ambulatory Visit (INDEPENDENT_AMBULATORY_CARE_PROVIDER_SITE_OTHER): Payer: Medicare HMO

## 2021-11-28 ENCOUNTER — Ambulatory Visit: Payer: Medicare HMO

## 2021-11-28 ENCOUNTER — Other Ambulatory Visit: Payer: Self-pay

## 2021-11-28 VITALS — BP 124/74 | HR 81 | Temp 98.4°F | Wt 213.8 lb

## 2021-11-28 DIAGNOSIS — Z0000 Encounter for general adult medical examination without abnormal findings: Secondary | ICD-10-CM

## 2021-11-28 NOTE — Progress Notes (Addendum)
Subjective:   [name] is a 76 y.o. male who presents for Medicare Annual/Subsequent preventive examination.  Review of Systems     Cardiac Risk Factors include: advanced age (>51men, >47 women);diabetes mellitus;hypertension;dyslipidemia;male gender;obesity (BMI >30kg/m2)     Objective:    Today's Vitals   11/28/21 1052 11/28/21 1056  BP: 124/74   Pulse: 81   Temp: 98.4 F (36.9 C)   SpO2: 93%   Weight: 213 lb 12.8 oz (97 kg)   PainSc:  5    Body mass index is 30.68 kg/m.  Advanced Directives 11/28/2021 11/22/2020 06/29/2019 06/22/2018 06/16/2017 04/05/2016 04/02/2016  Does Patient Have a Medical Advance Directive? Yes No Yes Yes Yes No No  Type of Advance Directive Living will - [place];Living will [place];Living will [place];Living will - -  Copy of [place] in Chart? - - No - copy requested No - copy requested No - copy requested - -  Would patient like information on creating a medical advance directive? - Yes (MAU/Ambulatory/Procedural Areas - Information given) - - - No - patient declined information No - patient declined information    Current Medications (verified) Outpatient Encounter Medications as of 11/28/2021  Medication Sig   amLODipine (NORVASC) 2.5 MG tablet Take 1 tablet (2.5 mg total) by mouth daily.   atorvastatin (LIPITOR) 80 MG tablet Take 1 tablet (80 mg total) by mouth daily.   buPROPion (WELLBUTRIN XL) 300 MG 24 hr tablet Take 1 tablet (300 mg total) by mouth daily.   clopidogrel (PLAVIX) 75 MG tablet Take 1 tablet (75 mg total) by mouth daily.   Cyanocobalamin (VITAMIN B 12 PO) Take by mouth.   famotidine (PEPCID) 20 MG tablet Take 1 tablet (20 mg total) by mouth 2 (two) times daily.   iron polysaccharides (NIFEREX) 150 MG capsule Take 1 capsule (150 mg total) by mouth daily.   metFORMIN (GLUCOPHAGE) 1000 MG tablet TAKE 1 TABLET (1,000 MG TOTAL) BY MOUTH DAILY WITH  BREAKFAST.   metoprolol tartrate (LOPRESSOR) 50 MG tablet Take 1 tablet (50 mg total) by mouth daily.   Multiple Vitamin (MULTIVITAMIN) tablet Take 1 tablet by mouth daily.   MYRBETRIQ 25 MG TB24 tablet    nitroGLYCERIN (NITROSTAT) 0.4 MG SL tablet Place 1 tablet (0.4 mg total) under the tongue every 5 (five) minutes as needed for chest pain (3 doses max).   pantoprazole (PROTONIX) 40 MG tablet Take 1 tablet (40 mg total) by mouth 2 (two) times daily.   PARoxetine (PAXIL) 40 MG tablet Take 1 tablet (40 mg total) by mouth daily.   SYMBICORT 160-4.5 MCG/ACT inhaler INHALE 2 PUFFS TWICE DAILY   tamsulosin (FLOMAX) 0.4 MG CAPS capsule    traZODone (DESYREL) 50 MG tablet Take 3 tablets (150 mg total) by mouth at bedtime.   [DISCONTINUED] empagliflozin ([name]) 10 MG TABS tablet Take 1 tablet (10 mg total) by mouth daily before breakfast. (Patient not taking: Reported on 11/28/2021)   No facility-administered encounter medications on file as of 11/28/2021.    Allergies (verified) Altace [ramipril], Codeine, Losartan, Remeron [mirtazapine], Tape, and Zantac [ranitidine hcl]   History: Past Medical History:  Diagnosis Date   Achilles tendinitis of left lower extremity 07/20/2018   Adenomatous colon polyp 2011   Barrett esophagus 12/2013   EGD   CAD (coronary artery disease)    ETT/Lexiscan-Myoview (11/14):  Normal; no ischemia, EF 67%   CKD (chronic kidney disease), stage III ([place])  Colon polyp 09/2010   adenoma   COPD (chronic obstructive pulmonary disease) ([place]) 2009   spirometry with "moderate COPD"   Depression    Diabetes mellitus without complication (HCC)    Dyspnea    GERD (gastroesophageal reflux disease)    GI bleed    after polypectomy/admitted   Hair loss 08/07/2018   HTN (hypertension)    Hypercholesterolemia    Memory loss    Myocardial infarction ([place])    Obesity    Sleep apnea    Spontaneous pneumothorax    Synovial cyst of right popliteal space 07/20/2018    Vertigo    Past Surgical History:  Procedure Laterality Date   APPENDECTOMY     CORONARY ANGIOPLASTY WITH STENT PLACEMENT  2007   LAD   PLEURAL SCARIFICATION  1987   TONSILLECTOMY     VARICOCELECTOMY     Family History  Problem Relation Age of Onset   Heart disease Mother    CVA Mother    Dementia Mother    Coronary artery disease Father 41   Heart disease Father    Stroke Father    Dementia Sister    Cancer - Other Brother    Hypertension Other    Hyperlipidemia Other    Colon polyps Sister    Cancer Sister    Social History   Socioeconomic History   Marital status: Married    Spouse name: Not on file   Number of children: 2   Years of education: come college   Highest education level: Not on file  Occupational History   Occupation: retired Furniture conservator/restorer  Tobacco Use   Smoking status: Former    Types: Cigarettes    Quit date: 11/25/1998    Years since quitting: 23.0   Smokeless tobacco: Never  Vaping Use   Vaping Use: Never used  Substance and Sexual Activity   Alcohol use: No   Drug use: No   Sexual activity: Not Currently  Other Topics Concern   Not on file  Social History Narrative   Lives at home with wife. Married to [name].   Retired Production designer, theatre/television/film truck.    Drinks caffeine (3 cups coffee, 2 sodas per day),  Takes a daily vitamin, wears a seatbelt   Wears a bicycle helmet.    Has dentures (partial on top)   Smoke detector in the home. No firearms in the home.    Feels safe in relationships.     Right-handed.   Social Determinants of Health   Financial Resource Strain: Low Risk    Difficulty of Paying Living Expenses: Not hard at all  Food Insecurity: No Food Insecurity   Worried About Charity fundraiser in the Last Year: Never true   [place] in the Last Year: Never true  Transportation Needs: No Transportation Needs   Lack of Transportation (Medical): No   Lack of Transportation (Non-Medical): No  Physical Activity: Inactive   Days of Exercise  per Week: 0 days   Minutes of Exercise per Session: 0 min  Stress: No Stress Concern Present   Feeling of Stress : Only a little  Social Connections: Moderately Isolated   Frequency of Communication with Friends and Family: More than three times a week   Frequency of Social Gatherings with Friends and Family: More than three times a week   Attends Religious Services: Never   Marine scientist or Organizations: No   Attends Archivist Meetings: Never   Marital Status:  Married    Tobacco Counseling Counseling given: Not Answered   Clinical Intake:  Pre-visit preparation completed: Yes  Pain : 0-10 Pain Score: 5  Pain Type: Chronic pain Pain Location: Knee Pain Orientation: Right Pain Descriptors / Indicators: Aching Pain Onset: More than a month ago Pain Frequency: Intermittent     BMI - recorded: 30.68 Nutritional Status: BMI > 30  Obese Nutritional Risks: None Diabetes: Yes CBG done?: No Did pt. bring in CBG monitor from home?: No  How often do you need to have someone help you when you read instructions, pamphlets, or other written materials from your doctor or pharmacy?: 1 - Never  Diabetic?Nutrition Risk Assessment:  Has the patient had any N/V/D within the last 2 months?  No  Does the patient have any non-healing wounds?  No  Has the patient had any unintentional weight loss or weight gain?  No   Diabetes:  Is the patient diabetic?  Yes  If diabetic, was a CBG obtained today?  No  Did the patient bring in their glucometer from home?  No  How often do you monitor your CBG's? N/A.   Financial Strains and Diabetes Management:  Are you having any financial strains with the device, your supplies or your medication? No .  Does the patient want to be seen by Chronic Care Management for management of their diabetes?  No  Would the patient like to be referred to a Nutritionist or for Diabetic Management?  No   Diabetic Exams:  Diabetic Eye Exam:  Completed 06/25/21 Diabetic Foot Exam: Completed 09/07/21   Interpreter Needed?: No  Information entered by :: Charlott Rakes, LPN   Activities of Daily Living In your present state of health, do you have any difficulty performing the following activities: 11/28/2021  Hearing? Y  Vision? N  Difficulty concentrating or making decisions? Y  Walking or climbing stairs? N  Dressing or bathing? N  Doing errands, shopping? N  Preparing Food and eating ? N  Using the Toilet? N  In the past six months, have you accidently leaked urine? Y  Comment wears a pad  Do you have problems with loss of bowel control? Y  Managing your Medications? N  Managing your Finances? N  Housekeeping or managing your Housekeeping? N  Some recent data might be hidden    Patient Care Team: [name], DO as PCP - General (Family Medicine) Josue Hector, MD as PCP - Cardiology (Cardiology) Brunetta Genera, MD as Consulting Physician (Hematology) Josue Hector, MD as Consulting Physician (Cardiology) Wilford Corner, MD as Consulting Physician (Gastroenterology) Melina Schools, OD (Optometry) Kathie Rhodes, MD (Inactive) as Consulting Physician (Urology) Hollar, Katharine Look, MD as Referring Physician (Dermatology)  Indicate any recent Medical Services you may have received from other than [facility] providers in the past year (date may be approximate).     Assessment:   This is a routine wellness examination for [place].  Hearing/Vision screen Hearing Screening - Comments:: Pt wears hearing aids  Vision Screening - Comments:: Pt follows up with provider for annual eye exams   Dietary issues and exercise activities discussed: Current Exercise Habits: The patient does not participate in regular exercise at present   Goals Addressed             This Visit's Progress    Patient Stated       Lose weight        Depression Screen [facility] 2/9 Scores 11/28/2021 09/07/2021 05/09/2021 03/08/2021  11/22/2020 11/09/2020 07/10/2020  PHQ - 2 Score 1 6 3 6 1  0 0  PHQ- 9 Score - 15 10 13  - 11 0    Fall Risk Fall Risk  11/28/2021 09/07/2021 11/22/2020 11/09/2020 07/10/2020  Falls in the past year? 0 0 0 0 0  Number falls in past yr: 0 0 0 0 0  Injury with Fall? 0 0 0 0 0  Risk for fall due to : Impaired vision;Impaired balance/gait - - - -  Follow up Falls prevention discussed Falls evaluation completed Falls prevention discussed Falls evaluation completed -    FALL RISK PREVENTION PERTAINING TO THE HOME:  Any stairs in or around the home? Yes  If so, are there any without handrails? No  Home free of loose throw rugs in walkways, pet beds, electrical cords, etc? Yes  Adequate lighting in your home to reduce risk of falls? Yes   ASSISTIVE DEVICES UTILIZED TO PREVENT FALLS:  Life alert? No  Use of a cane, walker or w/c? No  Grab bars in the bathroom? No  Shower chair or bench in shower? No  Elevated toilet seat or a handicapped toilet? No   TIMED UP AND GO:  Was the test performed? Yes .  Length of time to ambulate 10 feet: 10 sec.   Gait steady and fast without use of assistive device  Cognitive Function: MMSE - Mini Mental State Exam 08/16/2020 06/29/2019 06/22/2018 06/16/2017  Orientation to time 5 5 5 5   Orientation to Place 5 5 5 5   Registration 3 3 3 3   Attention/ Calculation 5 5 5 5   Recall 2 2 0 3  Language- name 2 objects 2 2 2 2   Language- repeat 1 1 1 1   Language- follow 3 step command 3 3 3 3   Language- read & follow direction 1 1 1 1   Write a sentence 1 1 1 1   Copy design 1 1 1 1   Total score 29 29 27 30      6CIT Screen 11/28/2021 11/22/2020  What Year? 0 points 0 points  What month? 0 points 0 points  What time? 0 points 0 points  Count back from 20 0 points 0 points  Months in reverse 4 points 2 points  Repeat phrase 6 points 2 points  Total Score 10 4    Immunizations Immunization History  Administered Date(s) Administered   Fluad Quad(high Dose  65+) 08/18/2019, 11/09/2020, 09/07/2021   Influenza, High Dose Seasonal PF 09/03/2016, 09/15/2017, 08/07/2018   PFIZER(Purple Top)SARS-COV-2 Vaccination 01/20/2020, 02/09/2020, 01/03/2021   Pneumococcal Conjugate-13 09/03/2016   Pneumococcal Polysaccharide-23 07/10/2017   Tdap 01/15/2016    TDAP status: Up to date  Flu Vaccine status: Up to date  Pneumococcal vaccine status: Up to date  Covid-19 vaccine status: Completed vaccines  Qualifies for Shingles Vaccine? Yes   Zostavax completed No   Shingrix Completed?: No.    Education has been provided regarding the importance of this vaccine. Patient has been advised to call insurance company to determine out of pocket expense if they have not yet received this vaccine. Advised may also receive vaccine at local pharmacy or Health Dept. Verbalized acceptance and understanding.  Screening Tests Health Maintenance  Topic Date Due   COVID-19 Vaccine (4 - Booster for Pfizer series) 02/28/2021   Zoster Vaccines- Shingrix (1 of 2) 12/08/2021 (Originally 10/12/1965)   COLONOSCOPY (Pts 45-54yrs Insurance coverage will need to be confirmed)  01/14/2022 (Originally 04/02/2021)   HEMOGLOBIN A1C  03/08/2022   OPHTHALMOLOGY  EXAM  06/25/2022   FOOT EXAM  09/07/2022   URINE MICROALBUMIN  09/07/2022   TETANUS/TDAP  01/14/2026   Pneumonia Vaccine 18+ Years old  Completed   INFLUENZA VACCINE  Completed   Hepatitis C Screening  Completed   HPV VACCINES  Aged Out    Health Maintenance  Health Maintenance Due  Topic Date Due   COVID-19 Vaccine (4 - Booster for Pfizer series) 02/28/2021    Colorectal cancer screening: Type of screening: Colonoscopy. Completed 04/02/16. Repeat every 5 years   Additional Screening:  Hepatitis C Screening:  Completed 05/20/16  Vision Screening: Recommended annual ophthalmology exams for early detection of glaucoma and other disorders of the eye. Is the patient up to date with their annual eye exam?  Yes  Who is the  provider or what is the name of the office in which the patient attends annual eye exams? Forgot providers name or practice  If pt is not established with a provider, would they like to be referred to a provider to establish care? No .   Dental Screening: Recommended annual dental exams for proper oral hygiene  Community Resource Referral / Chronic Care Management: CRR required this visit?  No   CCM required this visit?  No      Plan:     I have personally reviewed and noted the following in the patients chart:   Medical and social history Use of alcohol, tobacco or illicit drugs  Current medications and supplements including opioid prescriptions. Patient is not currently taking opioid prescriptions. Functional ability and status Nutritional status Physical activity Advanced directives List of other physicians Hospitalizations, surgeries, and ER visits in previous 12 months Vitals Screenings to include cognitive, depression, and falls Referrals and appointments  In addition, I have reviewed and discussed with patient certain preventive protocols, quality metrics, and best practice recommendations. A written personalized care plan for preventive services as well as general preventive health recommendations were provided to patient.     Willette Brace, LPN   0/07/4708   Nurse Notes: None

## 2021-11-28 NOTE — Patient Instructions (Signed)
[name] , Thank you for taking time to come for your Medicare Wellness Visit. I appreciate your ongoing commitment to your health goals. Please review the following plan we discussed and let me know if I can assist you in the future.   Screening recommendations/referrals: Colonoscopy: Done 04/02/16 repeat every 5 years  Recommended yearly ophthalmology/optometry visit for glaucoma screening and checkup Recommended yearly dental visit for hygiene and checkup  Vaccinations: Influenza vaccine: Done 09/07/21 repeat every year Pneumococcal vaccine: Up to date Tdap vaccine: Done 01/15/16 repeat every 10 years  Shingles vaccine: Shingrix discussed. Please contact your pharmacy for coverage information.    Covid-19: Completed 2/25, 02/09/20 & 01/03/21  Advanced directives: Please bring a copy of your health care power of attorney and living will to the office at your convenience.  Conditions/risks identified: lose weight   Next appointment: Follow up in one year for your annual wellness visit.   Preventive Care 42 Years and Older, Male Preventive care refers to lifestyle choices and visits with your health care provider that can promote health and wellness. What does preventive care include? A yearly physical exam. This is also called an annual well check. Dental exams once or twice a year. Routine eye exams. Ask your health care provider how often you should have your eyes checked. Personal lifestyle choices, including: Daily care of your teeth and gums. Regular physical activity. Eating a healthy diet. Avoiding tobacco and drug use. Limiting alcohol use. Practicing safe sex. Taking low doses of aspirin every day. Taking vitamin and mineral supplements as recommended by your health care provider. What happens during an annual well check? The services and screenings done by your health care provider during your annual well check will depend on your age, overall health, lifestyle risk factors,  and family history of disease. Counseling  Your health care provider may ask you questions about your: Alcohol use. Tobacco use. Drug use. Emotional well-being. Home and relationship well-being. Sexual activity. Eating habits. History of falls. Memory and ability to understand (cognition). Work and work Statistician. Screening  You may have the following tests or measurements: Height, weight, and BMI. Blood pressure. Lipid and cholesterol levels. These may be checked every 5 years, or more frequently if you are over 78 years old. Skin check. Lung cancer screening. You may have this screening every year starting at age 26 if you have a 30-pack-year history of smoking and currently smoke or have quit within the past 15 years. Fecal occult blood test (FOBT) of the stool. You may have this test every year starting at age 74. Flexible sigmoidoscopy or colonoscopy. You may have a sigmoidoscopy every 5 years or a colonoscopy every 10 years starting at age 54. Prostate cancer screening. Recommendations will vary depending on your family history and other risks. Hepatitis C blood test. Hepatitis B blood test. Sexually transmitted disease (STD) testing. Diabetes screening. This is done by checking your blood sugar (glucose) after you have not eaten for a while (fasting). You may have this done every 1-3 years. Abdominal aortic aneurysm (AAA) screening. You may need this if you are a current or former smoker. Osteoporosis. You may be screened starting at age 58 if you are at high risk. Talk with your health care provider about your test results, treatment options, and if necessary, the need for more tests. Vaccines  Your health care provider may recommend certain vaccines, such as: Influenza vaccine. This is recommended every year. Tetanus, diphtheria, and acellular pertussis (Tdap, Td) vaccine. You  may need a Td booster every 10 years. Zoster vaccine. You may need this after age  28. Pneumococcal 13-valent conjugate (PCV13) vaccine. One dose is recommended after age 27. Pneumococcal polysaccharide (PPSV23) vaccine. One dose is recommended after age 43. Talk to your health care provider about which screenings and vaccines you need and how often you need them. This information is not intended to replace advice given to you by your health care provider. Make sure you discuss any questions you have with your health care provider. Document Released: 12/08/2015 Document Revised: 07/31/2016 Document Reviewed: 09/12/2015 Elsevier Interactive Patient Education  2017 [place] Prevention in the Home Falls can cause injuries. They can happen to people of all ages. There are many things you can do to make your home safe and to help prevent falls. What can I do on the outside of my home? Regularly fix the edges of walkways and driveways and fix any cracks. Remove anything that might make you trip as you walk through a door, such as a raised step or threshold. Trim any bushes or trees on the path to your home. Use bright outdoor lighting. Clear any walking paths of anything that might make someone trip, such as rocks or tools. Regularly check to see if handrails are loose or broken. Make sure that both sides of any steps have handrails. Any raised decks and porches should have guardrails on the edges. Have any leaves, snow, or ice cleared regularly. Use sand or salt on walking paths during winter. Clean up any spills in your garage right away. This includes oil or grease spills. What can I do in the bathroom? Use night lights. Install grab bars by the toilet and in the tub and shower. Do not use towel bars as grab bars. Use non-skid mats or decals in the tub or shower. If you need to sit down in the shower, use a plastic, non-slip stool. Keep the floor dry. Clean up any water that spills on the floor as soon as it happens. Remove soap buildup in the tub or shower  regularly. Attach bath mats securely with double-sided non-slip rug tape. Do not have throw rugs and other things on the floor that can make you trip. What can I do in the bedroom? Use night lights. Make sure that you have a light by your bed that is easy to reach. Do not use any sheets or blankets that are too big for your bed. They should not hang down onto the floor. Have a firm chair that has side arms. You can use this for support while you get dressed. Do not have throw rugs and other things on the floor that can make you trip. What can I do in the kitchen? Clean up any spills right away. Avoid walking on wet floors. Keep items that you use a lot in easy-to-reach places. If you need to reach something above you, use a strong step stool that has a grab bar. Keep electrical cords out of the way. Do not use floor polish or wax that makes floors slippery. If you must use wax, use non-skid floor wax. Do not have throw rugs and other things on the floor that can make you trip. What can I do with my stairs? Do not leave any items on the stairs. Make sure that there are handrails on both sides of the stairs and use them. Fix handrails that are broken or loose. Make sure that handrails are as long as the  stairways. Check any carpeting to make sure that it is firmly attached to the stairs. Fix any carpet that is loose or worn. Avoid having throw rugs at the top or bottom of the stairs. If you do have throw rugs, attach them to the floor with carpet tape. Make sure that you have a light switch at the top of the stairs and the bottom of the stairs. If you do not have them, ask someone to add them for you. What else can I do to help prevent falls? Wear shoes that: Do not have high heels. Have rubber bottoms. Are comfortable and fit you well. Are closed at the toe. Do not wear sandals. If you use a stepladder: Make sure that it is fully opened. Do not climb a closed stepladder. Make sure that  both sides of the stepladder are locked into place. Ask someone to hold it for you, if possible. Clearly mark and make sure that you can see: Any grab bars or handrails. First and last steps. Where the edge of each step is. Use tools that help you move around (mobility aids) if they are needed. These include: Canes. Walkers. Scooters. Crutches. Turn on the lights when you go into a dark area. Replace any light bulbs as soon as they burn out. Set up your furniture so you have a clear path. Avoid moving your furniture around. If any of your floors are uneven, fix them. If there are any pets around you, be aware of where they are. Review your medicines with your doctor. Some medicines can make you feel dizzy. This can increase your chance of falling. Ask your doctor what other things that you can do to help prevent falls. This information is not intended to replace advice given to you by your health care provider. Make sure you discuss any questions you have with your health care provider. Document Released: 09/07/2009 Document Revised: 04/18/2016 Document Reviewed: 12/16/2014 Elsevier Interactive Patient Education  2017 [employer].

## 2021-12-17 DIAGNOSIS — R350 Frequency of micturition: Secondary | ICD-10-CM | POA: Diagnosis not present

## 2021-12-17 DIAGNOSIS — N401 Enlarged prostate with lower urinary tract symptoms: Secondary | ICD-10-CM | POA: Diagnosis not present

## 2021-12-17 DIAGNOSIS — R351 Nocturia: Secondary | ICD-10-CM | POA: Diagnosis not present

## 2021-12-17 LAB — PSA: PSA: 11.75

## 2022-01-08 ENCOUNTER — Other Ambulatory Visit: Payer: Self-pay | Admitting: Cardiovascular Disease

## 2022-01-09 ENCOUNTER — Encounter: Payer: Self-pay | Admitting: Family Medicine

## 2022-01-09 ENCOUNTER — Other Ambulatory Visit: Payer: Self-pay

## 2022-01-09 ENCOUNTER — Ambulatory Visit (INDEPENDENT_AMBULATORY_CARE_PROVIDER_SITE_OTHER): Payer: Medicare HMO | Admitting: Family Medicine

## 2022-01-09 VITALS — BP 129/68 | HR 71 | Temp 98.5°F | Ht 70.0 in | Wt 209.0 lb

## 2022-01-09 DIAGNOSIS — D500 Iron deficiency anemia secondary to blood loss (chronic): Secondary | ICD-10-CM

## 2022-01-09 DIAGNOSIS — F3341 Major depressive disorder, recurrent, in partial remission: Secondary | ICD-10-CM

## 2022-01-09 DIAGNOSIS — E1142 Type 2 diabetes mellitus with diabetic polyneuropathy: Secondary | ICD-10-CM | POA: Diagnosis not present

## 2022-01-09 DIAGNOSIS — G319 Degenerative disease of nervous system, unspecified: Secondary | ICD-10-CM

## 2022-01-09 DIAGNOSIS — D6869 Other thrombophilia: Secondary | ICD-10-CM

## 2022-01-09 DIAGNOSIS — N1832 Chronic kidney disease, stage 3b: Secondary | ICD-10-CM | POA: Diagnosis not present

## 2022-01-09 DIAGNOSIS — M25561 Pain in right knee: Secondary | ICD-10-CM | POA: Diagnosis not present

## 2022-01-09 DIAGNOSIS — E785 Hyperlipidemia, unspecified: Secondary | ICD-10-CM | POA: Diagnosis not present

## 2022-01-09 DIAGNOSIS — K219 Gastro-esophageal reflux disease without esophagitis: Secondary | ICD-10-CM

## 2022-01-09 DIAGNOSIS — I2510 Atherosclerotic heart disease of native coronary artery without angina pectoris: Secondary | ICD-10-CM

## 2022-01-09 DIAGNOSIS — N138 Other obstructive and reflux uropathy: Secondary | ICD-10-CM

## 2022-01-09 DIAGNOSIS — R972 Elevated prostate specific antigen [PSA]: Secondary | ICD-10-CM

## 2022-01-09 DIAGNOSIS — E1169 Type 2 diabetes mellitus with other specified complication: Secondary | ICD-10-CM | POA: Diagnosis not present

## 2022-01-09 DIAGNOSIS — I252 Old myocardial infarction: Secondary | ICD-10-CM

## 2022-01-09 DIAGNOSIS — I679 Cerebrovascular disease, unspecified: Secondary | ICD-10-CM

## 2022-01-09 DIAGNOSIS — I10 Essential (primary) hypertension: Secondary | ICD-10-CM | POA: Diagnosis not present

## 2022-01-09 DIAGNOSIS — I6782 Cerebral ischemia: Secondary | ICD-10-CM

## 2022-01-09 DIAGNOSIS — N401 Enlarged prostate with lower urinary tract symptoms: Secondary | ICD-10-CM

## 2022-01-09 DIAGNOSIS — K2100 Gastro-esophageal reflux disease with esophagitis, without bleeding: Secondary | ICD-10-CM | POA: Diagnosis not present

## 2022-01-09 LAB — POCT GLYCOSYLATED HEMOGLOBIN (HGB A1C)
HbA1c POC (<> result, manual entry): 7.5 % (ref 4.0–5.6)
HbA1c, POC (controlled diabetic range): 7.5 % — AB (ref 0.0–7.0)
HbA1c, POC (prediabetic range): 7.5 % — AB (ref 5.7–6.4)
Hemoglobin A1C: 7.5 % — AB (ref 4.0–5.6)

## 2022-01-09 MED ORDER — BUPROPION HCL ER (XL) 300 MG PO TB24: 300.0000 mg | ORAL_TABLET | Freq: Every day | ORAL | 1 refills | Status: DC

## 2022-01-09 MED ORDER — CLOPIDOGREL BISULFATE 75 MG PO TABS: 75.0000 mg | ORAL_TABLET | Freq: Every day | ORAL | 0 refills | Status: DC

## 2022-01-09 MED ORDER — TRAZODONE HCL 50 MG PO TABS: 150.0000 mg | ORAL_TABLET | Freq: Every day | ORAL | 1 refills | Status: DC

## 2022-01-09 MED ORDER — SYMBICORT 160-4.5 MCG/ACT IN AERO: 2.0000 | INHALATION_SPRAY | Freq: Two times a day (BID) | RESPIRATORY_TRACT | 4 refills | Status: DC

## 2022-01-09 MED ORDER — ATORVASTATIN CALCIUM 80 MG PO TABS: 80.0000 mg | ORAL_TABLET | Freq: Every day | ORAL | 3 refills | Status: DC

## 2022-01-09 MED ORDER — DAPAGLIFLOZIN PROPANEDIOL 5 MG PO TABS: 5.0000 mg | ORAL_TABLET | Freq: Every day | ORAL | 1 refills | Status: DC

## 2022-01-09 MED ORDER — METFORMIN HCL 1000 MG PO TABS: ORAL_TABLET | ORAL | 1 refills | Status: DC

## 2022-01-09 MED ORDER — PAROXETINE HCL 40 MG PO TABS: 40.0000 mg | ORAL_TABLET | Freq: Every day | ORAL | 1 refills | Status: DC

## 2022-01-09 MED ORDER — FAMOTIDINE 20 MG PO TABS: 20.0000 mg | ORAL_TABLET | Freq: Two times a day (BID) | ORAL | 3 refills | Status: DC

## 2022-01-09 MED ORDER — METOPROLOL TARTRATE 50 MG PO TABS: 50.0000 mg | ORAL_TABLET | Freq: Every day | ORAL | 1 refills | Status: DC

## 2022-01-09 MED ORDER — ZOSTER VAC RECOMB ADJUVANTED 50 MCG/0.5ML IM SUSR: 0.5000 mL | Freq: Once | INTRAMUSCULAR | 0 refills | Status: AC

## 2022-01-09 MED ORDER — AMLODIPINE BESYLATE 2.5 MG PO TABS: 2.5000 mg | ORAL_TABLET | Freq: Every day | ORAL | 1 refills | Status: DC

## 2022-01-09 MED ORDER — PANTOPRAZOLE SODIUM 40 MG PO TBEC: 40.0000 mg | DELAYED_RELEASE_TABLET | Freq: Two times a day (BID) | ORAL | 3 refills | Status: DC

## 2022-01-09 NOTE — Progress Notes (Signed)
[name] , 1946/02/15, 76 y.o., male MRN: [id number] Patient Care Team    Relationship Specialty Notifications Start End  [name], DO PCP - General Family Medicine  12/25/15   Josue Hector, MD PCP - Cardiology Cardiology Admissions 08/20/18   Brunetta Genera, MD Consulting Physician Hematology  09/03/16   Josue Hector, MD Consulting Physician Cardiology  09/03/16   Wilford Corner, MD Consulting Physician Gastroenterology  09/03/16   Melina Schools, OD  Optometry  09/03/16   Kathie Rhodes, MD (Inactive) Consulting Physician Urology  09/10/16   Hollar, Katharine Look, MD Referring Physician Dermatology  06/29/19     Chief Complaint  Patient presents with   Diabetes    [facility]; pt is fasting    Subjective: [name] is a 76 y.o. male present for [facility] Recurrent major depressive disorder, in partial remission (HCC)/memory changes Patient feels that his depression is well controlled currently.  He reports compliance with trazodone 150 mg nightly, Paxil 40 mg daily and Wellbutrin 300 mg daily. Prior note: He was having some increase in symptoms, but also experiencing mild cognitive changes. .  Current medication regimen of trazodone, paxil and Wellbutrin. He denies negative side effects occasions.  He and his wife both have noticed he is having some memory changes which have him concerned- which has mildly improved since stopping Remeron and starting low dose paxil.  He reports both his mother and his sisters had dementia.  He feels he is becoming more forgetful.  His wife has told him he will talk about the same things that they had already talked about prior.  Gastroesophageal reflux disease, esophagitis presence not specified  EGD 03/2016 by Dr. Michail Sermon with signs of barrett's esophagus. Patient was tried on lower dose medication, and was unable to tolerate secondary to return of symptoms.  His symptoms are well controlled with protonix 40 BID and pepcid BID.   COPD  ([place]) He uses Symbicort daily, Spiriva needed during certain times of the year only. His pneumonia series is completed.  Symptoms are well controlled on Symbicort only as needed now.  Atherosclerosis of native coronary artery of native heart without angina pectoris/Essential hypertension/HYPERCHOLESTEROLEMIA/History of MI (myocardial infarction)/IDA He has known CAD with prior LAD stent from 2007. Pt reports compliance with metoprolol 50 mg daily and amlodipine 2.5 mg daily Qd  Pt takes a daily Plavix. Pt is  prescribed statin and followed by Dr. Johnsie Cancel Cardiology.  Diet: Low-sodium RF: Hypertension, hyperlipidemia, diabetes  Type 2 diabetes mellitus without complication, without long-term current use of insulin ([place]) patient reports compliance with metformin 1000 mg Qd.he never started the [name] because he was worried about the potential side effects.  Patient denies dizziness, hyperglycemic or hypoglycemic events. Patient denies numbness, tingling in the extremities or nonhealing wounds of feet.     Pt reports BG ranges not routinely checked.  Depression screen [facility] 2/9 11/28/2021 09/07/2021 05/09/2021 03/08/2021 11/22/2020  Decreased Interest 0 3 0 3 0  Down, Depressed, Hopeless _0 PHQ - 2 Score _1 Altered sleeping - _2 -  Tired, decreased energy - 3 0 0 -  Change in appetite - 2 0 0 -  Feeling bad or failure about yourself  - 0 0 1 -  Trouble concentrating - _3 -  Moving slowly or fidgety/restless - 0 3 1 -  Suicidal thoughts - 0 0 0 -  PHQ-9  Score - _0 -  Difficult doing work/chores - - Somewhat difficult Somewhat difficult -  Some recent data might be hidden   GAD 7 : Generalized Anxiety Score 09/07/2021 03/08/2021 02/12/2019 12/25/2015  Nervous, Anxious, on Edge 2 3 0 2  Control/stop worrying _1 Worry too much - different things _2 Trouble relaxing _3 Restless _4 Easily annoyed or irritable _5 Afraid - awful might  happen 0 0 0 0  Total GAD 7 Score _6 Anxiety Difficulty - - Somewhat difficult Somewhat difficult    Allergies  Allergen Reactions   Altace [Ramipril] Cough   Codeine     Makes sick   Losartan Cough    cough   Remeron [Mirtazapine] Other (See Comments)    Cognitive changes   Tape     Blisters    Zantac [Ranitidine Hcl] Dermatitis   Social History   Tobacco Use   Smoking status: Former    Types: Cigarettes    Quit date: 11/25/1998    Years since quitting: 23.1   Smokeless tobacco: Never  Substance Use Topics   Alcohol use: No   Past Medical History:  Diagnosis Date   Achilles tendinitis of left lower extremity 07/20/2018   Adenomatous colon polyp 2011   Barrett esophagus 12/2013   EGD   CAD (coronary artery disease)    ETT/Lexiscan-Myoview (11/14):  Normal; no ischemia, EF 67%   CKD (chronic kidney disease), stage III (HCC)    Colon polyp 09/2010   adenoma   COPD (chronic obstructive pulmonary disease) ([place]) 2009   spirometry with "moderate COPD"   Depression    Diabetes mellitus without complication (HCC)    Dyspnea    GERD (gastroesophageal reflux disease)    GI bleed    after polypectomy/admitted   Hair loss 08/07/2018   HTN (hypertension)    Hypercholesterolemia    Memory loss    Myocardial infarction ([place])    Obesity    Sleep apnea    Spontaneous pneumothorax    Synovial cyst of right popliteal space 07/20/2018   Vertigo    Past Surgical History:  Procedure Laterality Date   APPENDECTOMY     CORONARY ANGIOPLASTY WITH STENT PLACEMENT  2007   LAD   PLEURAL SCARIFICATION  1987   TONSILLECTOMY     VARICOCELECTOMY     Family History  Problem Relation Age of Onset   Heart disease Mother    CVA Mother    Dementia Mother    Coronary artery disease Father 5   Heart disease Father    Stroke Father    Dementia Sister    Cancer - Other Brother    Hypertension Other    Hyperlipidemia Other    Colon polyps Sister    Cancer Sister     Allergies as of 01/09/2022       Reactions   Altace [ramipril] Cough   Codeine    Makes sick   Losartan Cough   cough   Remeron [mirtazapine] Other (See Comments)   Cognitive changes   Tape    Blisters   Zantac [ranitidine Hcl] Dermatitis        Medication List        Accurate as of January 09, 2022  3:24 PM. If you have any questions, ask your nurse or doctor.  amLODipine 2.5 MG tablet Commonly known as: NORVASC Take 1 tablet (2.5 mg total) by mouth daily.   atorvastatin 80 MG tablet Commonly known as: LIPITOR Take 1 tablet (80 mg total) by mouth daily.   buPROPion 300 MG 24 hr tablet Commonly known as: WELLBUTRIN XL Take 1 tablet (300 mg total) by mouth daily.   clopidogrel 75 MG tablet Commonly known as: PLAVIX Take 1 tablet (75 mg total) by mouth daily. Please make overdue appt with Dr. Johnsie Cancel before anymore refills. Thank you 1st attempt   dapagliflozin propanediol 5 MG Tabs tablet Commonly known as: FARXIGA Take 1 tablet (5 mg total) by mouth daily before breakfast. Started by: Howard Pouch, DO   famotidine 20 MG tablet Commonly known as: Pepcid Take 1 tablet (20 mg total) by mouth 2 (two) times daily.   iron polysaccharides 150 MG capsule Commonly known as: NIFEREX Take 1 capsule (150 mg total) by mouth daily.   metFORMIN 1000 MG tablet Commonly known as: GLUCOPHAGE TAKE 1 TABLET (1,000 MG TOTAL) BY MOUTH DAILY WITH BREAKFAST.   metoprolol tartrate 50 MG tablet Commonly known as: LOPRESSOR Take 1 tablet (50 mg total) by mouth daily.   multivitamin tablet Take 1 tablet by mouth daily.   Myrbetriq 25 MG Tb24 tablet Generic drug: mirabegron ER   nitroGLYCERIN 0.4 MG SL tablet Commonly known as: NITROSTAT Place 1 tablet (0.4 mg total) under the tongue every 5 (five) minutes as needed for chest pain (3 doses max).   pantoprazole 40 MG tablet Commonly known as: PROTONIX Take 1 tablet (40 mg total) by mouth 2 (two) times daily.    PARoxetine 40 MG tablet Commonly known as: PAXIL Take 1 tablet (40 mg total) by mouth daily.   Symbicort 160-4.5 MCG/ACT inhaler Generic drug: budesonide-formoterol Inhale 2 puffs into the lungs 2 (two) times daily.   tamsulosin 0.4 MG Caps capsule Commonly known as: FLOMAX   traZODone 50 MG tablet Commonly known as: DESYREL Take 3 tablets (150 mg total) by mouth at bedtime.   VITAMIN B 12 PO Take by mouth.   Zoster Vaccine Adjuvanted injection Commonly known as: SHINGRIX Inject 0.5 mLs into the muscle once for 1 dose. Rpt dose in 2-6 mos once. Started by: Howard Pouch, DO        No results found.   ROS: Negative, with the exception of above mentioned in HPI   Objective:  BP 129/68    Pulse 71    Temp 98.5 F (36.9 C) (Oral)    Ht _0  (1.778 m)    Wt 209 lb (94.8 kg)    SpO2 96%    BMI 29.99 kg/m  Body mass index is 29.99 kg/m. Physical Exam Vitals and nursing note reviewed.  Constitutional:      General: He is not in acute distress.    Appearance: Normal appearance. He is not ill-appearing, toxic-appearing or diaphoretic.  HENT:     Head: Normocephalic and atraumatic.     Mouth/Throat:     Mouth: Mucous membranes are moist.  Eyes:     General: No scleral icterus.       Right eye: No discharge.        Left eye: No discharge.     Extraocular Movements: Extraocular movements intact.     Pupils: Pupils are equal, round, and reactive to light.  Cardiovascular:     Rate and Rhythm: Normal rate and regular rhythm.     Heart sounds: No murmur heard.   No  gallop.  Pulmonary:     Effort: Pulmonary effort is normal. No respiratory distress.     Breath sounds: Normal breath sounds. No wheezing, rhonchi or rales.  Musculoskeletal:     Cervical back: Neck supple.     Right lower leg: No edema.     Left lower leg: No edema.  Lymphadenopathy:     Cervical: No cervical adenopathy.  Skin:    General: Skin is warm and dry.     Coloration: Skin is not jaundiced  or pale.     Findings: No rash.  Neurological:     Mental Status: He is alert and oriented to person, place, and time. Mental status is at baseline.  Psychiatric:        Mood and Affect: Mood normal.        Behavior: Behavior normal.        Thought Content: Thought content normal.        Judgment: Judgment normal.     Results for orders placed or performed in visit on 01/09/22 (from the past 48 hour(s))  POCT HgB A1C     Status: Abnormal   Collection Time: 01/09/22  2:38 PM  Result Value Ref Range   Hemoglobin A1C 7.5 (A) 4.0 - 5.6 %   HbA1c POC (<> result, manual entry) 7.5 4.0 - 5.6 %   HbA1c, POC (prediabetic range) 7.5 (A) 5.7 - 6.4 %   HbA1c, POC (controlled diabetic range) 7.5 (A) 0.0 - 7.0 %     Assessment/Plan: [name] is a 76 y.o. male present for OV for  Recurrent major depressive disorder, in partial remission (HCC) Stable Continue paxil to 40 mg Continue trazodone 2-3 tabs QHS PRN Continue wellbutrin 300 QD. -Tried meds: Remeron (possible memory changes) - Follow-up 4 months   Gastroesophageal reflux disease, esophagitis presence not specified Stable Continue Protonix 40 mg twice a day, was unable to take back secondary to recurrence of symptoms. Continue Pepcid twice daily today   Chronic bronchitis, unspecified chronic bronchitis type ([place]) Stable. Continue Symbicort when needed  Atherosclerosis of native coronary artery of native heart without angina pectoris/Essential hypertension/HYPERCHOLESTEROLEMIA/History of MI (myocardial infarction) Stage 3 chronic kidney disease/ iron deficiency anemia/acquired thrombophilia -Stable - following with cards, Dr. Johnsie Cancel. -Continue atorvastatin -Continue metoprolol Continue amlodipine 2.5 mg. -  plavix continue - low sodium diet and exercise encouraged.  cBC CMP lipids and TSH collected today PTH and vitamin D collected today  Type 2 diabetes mellitus with hyperlipidemia - A1c today 5.8-->  6.0-->6.1>>6.7> 6.5> 7.1 >7.1> 6.7> 6.8 >7.8> 7.5 today.  -Continue metformin to 1000 Qd> did not tolerate higher dose, -Never started [name] because he is worried about side effects.  Explained to him medications that we are adding to his regimen will always reports side effects are potential does not mean he will have those side effects.  These medications are also considered cardioprotective and kidney protective and is while we consider them.  Since he is uncomfortable with [name] start we will attempt to start Farxiga 5 mg daily to get him closer to goal of A1c of 7. Continue atorvastatin 80 - Foot exam: 09/07/2021 - eye exam: Completed 06/25/2021-patient source [place] - PNA: Series completed - Flu: UTD 2022- today, encouraged yearly - Urine Microalbumin w/creat. Ratio: completed 09/07/2021  Cerebral atrophy/microvascular ischemic occlusive disease Patient is having mild cognitive changes. Referral had been placed to neurology Patient is on a statin  Iron deficiency anemia: Currently on supplementation Iron panel UTD  Right knee pain: Of note during exam patient was noted to be limping.  Right knee with mild swelling and discomfort. Right knee x-ray ordered. Uric acid added to labs.  F/u [facility] 4 mos.   Reviewed expectations re: course of current medical issues. Discussed self-management of symptoms. Outlined signs and symptoms indicating need for more acute intervention. Patient verbalized understanding and all questions were answered. Patient received an After-Visit Summary.    Orders Placed This Encounter  Procedures   DG Knee Complete 4 Views Right   CBC   Comp Met (CMET)   Lipid panel   TSH   Vitamin D (25 hydroxy)   PTH, Intact and Calcium   PSA   Uric acid   POCT HgB A1C   Meds ordered this encounter  Medications   Zoster Vaccine Adjuvanted [facility]) injection    Sig: Inject 0.5 mLs into the muscle once for 1 dose. Rpt dose in 2-6 mos once.     Dispense:  0.5 mL    Refill:  0   dapagliflozin propanediol (FARXIGA) 5 MG TABS tablet    Sig: Take 1 tablet (5 mg total) by mouth daily before breakfast.    Dispense:  90 tablet    Refill:  1   amLODipine (NORVASC) 2.5 MG tablet    Sig: Take 1 tablet (2.5 mg total) by mouth daily.    Dispense:  90 tablet    Refill:  1   atorvastatin (LIPITOR) 80 MG tablet    Sig: Take 1 tablet (80 mg total) by mouth daily.    Dispense:  90 tablet    Refill:  3   buPROPion (WELLBUTRIN XL) 300 MG 24 hr tablet    Sig: Take 1 tablet (300 mg total) by mouth daily.    Dispense:  90 tablet    Refill:  1   clopidogrel (PLAVIX) 75 MG tablet    Sig: Take 1 tablet (75 mg total) by mouth daily. Please make overdue appt with Dr. Johnsie Cancel before anymore refills. Thank you 1st attempt    Dispense:  30 tablet    Refill:  0    Please call our office to schedule an overdue appointment with Dr. Johnsie Cancel before anymore refills. [phone]. Thank you 1st attempt   famotidine (PEPCID) 20 MG tablet    Sig: Take 1 tablet (20 mg total) by mouth 2 (two) times daily.    Dispense:  180 tablet    Refill:  3   metFORMIN (GLUCOPHAGE) 1000 MG tablet    Sig: TAKE 1 TABLET (1,000 MG TOTAL) BY MOUTH DAILY WITH BREAKFAST.    Dispense:  90 tablet    Refill:  1   metoprolol tartrate (LOPRESSOR) 50 MG tablet    Sig: Take 1 tablet (50 mg total) by mouth daily.    Dispense:  90 tablet    Refill:  1    MUST HAVE OV FOR FURTHER REFILLS   PARoxetine (PAXIL) 40 MG tablet    Sig: Take 1 tablet (40 mg total) by mouth daily.    Dispense:  90 tablet    Refill:  1   SYMBICORT 160-4.5 MCG/ACT inhaler    Sig: Inhale 2 puffs into the lungs 2 (two) times daily.    Dispense:  3 each    Refill:  4   traZODone (DESYREL) 50 MG tablet    Sig: Take 3 tablets (150 mg total) by mouth at bedtime.    Dispense:  270 tablet    Refill:  1  pantoprazole (PROTONIX) 40 MG tablet    Sig: Take 1 tablet (40 mg total) by mouth 2 (two) times daily.     Dispense:  180 tablet    Refill:  3      electronically signed by:  Howard Pouch, DO  [place]

## 2022-01-10 ENCOUNTER — Telehealth: Payer: Self-pay | Admitting: Family Medicine

## 2022-01-10 LAB — COMPREHENSIVE METABOLIC PANEL
AG Ratio: 1.7 (calc) (ref 1.0–2.5)
ALT: 30 U/L (ref 9–46)
AST: 17 U/L (ref 10–35)
Albumin: 4.6 g/dL (ref 3.6–5.1)
Alkaline phosphatase (APISO): 64 U/L (ref 35–144)
BUN/Creatinine Ratio: 14 (calc) (ref 6–22)
BUN: 20 mg/dL (ref 7–25)
CO2: 23 mmol/L (ref 20–32)
Calcium: 9.8 mg/dL (ref 8.6–10.3)
Chloride: 102 mmol/L (ref 98–110)
Creat: 1.48 mg/dL — ABNORMAL HIGH (ref 0.70–1.28)
Globulin: 2.7 g/dL (calc) (ref 1.9–3.7)
Glucose, Bld: 179 mg/dL — ABNORMAL HIGH (ref 65–99)
Potassium: 4.4 mmol/L (ref 3.5–5.3)
Sodium: 139 mmol/L (ref 135–146)
Total Bilirubin: 0.7 mg/dL (ref 0.2–1.2)
Total Protein: 7.3 g/dL (ref 6.1–8.1)

## 2022-01-10 LAB — LIPID PANEL
Cholesterol: 120 mg/dL (ref <200)
HDL: 44 mg/dL (ref >=40)
LDL Cholesterol (Calc): 45 mg/dL (calc)
Non-HDL Cholesterol (Calc): 76 mg/dL (calc) (ref <130)
Total CHOL/HDL Ratio: 2.7 (calc) (ref <5.0)
Triglycerides: 262 mg/dL — ABNORMAL HIGH (ref <150)

## 2022-01-10 LAB — CBC
HCT: 38.2 % — ABNORMAL LOW (ref 38.5–50.0)
Hemoglobin: 12.8 g/dL — ABNORMAL LOW (ref 13.2–17.1)
MCH: 28.5 pg (ref 27.0–33.0)
MCHC: 33.5 g/dL (ref 32.0–36.0)
MCV: 85.1 fL (ref 80.0–100.0)
MPV: 10.6 fL (ref 7.5–12.5)
Platelets: 218 10*3/uL (ref 140–400)
RBC: 4.49 10*6/uL (ref 4.20–5.80)
RDW: 14.2 % (ref 11.0–15.0)
WBC: 7.3 10*3/uL (ref 3.8–10.8)

## 2022-01-10 LAB — VITAMIN D 25 HYDROXY (VIT D DEFICIENCY, FRACTURES): Vit D, 25-Hydroxy: 33 ng/mL (ref 30–100)

## 2022-01-10 LAB — PTH, INTACT AND CALCIUM
Calcium: 9.8 mg/dL (ref 8.6–10.3)
PTH: 27 pg/mL (ref 16–77)

## 2022-01-10 LAB — URIC ACID: Uric Acid, Serum: 5 mg/dL (ref 4.0–8.0)

## 2022-01-10 LAB — TSH: TSH: 1.69 mIU/L (ref 0.40–4.50)

## 2022-01-10 MED ORDER — PREDNISONE 20 MG PO TABS: ORAL_TABLET | ORAL | 0 refills | Status: DC

## 2022-01-10 NOTE — Telephone Encounter (Signed)
Please call patient Liver and thyroid function are normal Kidney function is mildly decreased from prior.  I would encourage him to hydrate Blood cell counts and electrolytes are normal. Cholesterol panel overall looks very good with the exception of his triglycerides are still mildly elevated.  I had encouraged him to start fiber supplement such as Metamucil twice a day to help with these in the past. Vitamin D levels are normal.  We checked his uric acid levels to ensure his knee pain was not from gout.  His uric acid levels are normal therefore gout flare would be very unlikely as the cause.  Blood cell counts are also normal ruling out infection as a likely cause.  Please remind patient to have his knee x-ray completed today at [facility] on [address].  We will call him with that result once received. In the meantime I have called in a prednisone taper for him to take to help calm down the inflammatory response in his knee while we wait on the x-ray results.

## 2022-01-11 MED ORDER — CLOPIDOGREL BISULFATE 75 MG PO TABS: 75.0000 mg | ORAL_TABLET | Freq: Every day | ORAL | 3 refills | Status: DC

## 2022-01-11 NOTE — Telephone Encounter (Signed)
LVM for pt to CB regarding results.  

## 2022-01-14 ENCOUNTER — Telehealth: Payer: Self-pay

## 2022-01-14 MED ORDER — CLOPIDOGREL BISULFATE 75 MG PO TABS: 75.0000 mg | ORAL_TABLET | Freq: Every day | ORAL | 0 refills | Status: DC

## 2022-01-14 NOTE — Telephone Encounter (Signed)
LVM for pt to CB regarding results.  

## 2022-01-14 NOTE — Telephone Encounter (Signed)
Received a call from the patients wife requesting a refill on Plavix. She states the patient has been out of medication since last week. She stated they received a call from [facility] yesterday stating the rx was going to cost four hundred dollars. She requested we prescribe the generic. She states she declined the refill request from [facility] because she did not want to pay four hundred dollars.   Reviewed the chart and advised her that we sent the generic Plavix over to [facility] on Friday.   She requested we send in a generic rx for Plavix to the local pharmacy and they will pick it up today.   Rx(s) sent to pharmacy electronically.  Patients wife counseled on not waiting until the patient is out of medication to request a refill. Also advised patients wife to schedule am follow appointment. Informed the patients wife that we have no control over the cost of the medication.   Wife voiced understanding and f/u appt scheduled.

## 2022-01-14 NOTE — Telephone Encounter (Signed)
Spoke with patient regarding results/recommendations.  

## 2022-01-17 ENCOUNTER — Other Ambulatory Visit: Payer: Self-pay

## 2022-01-17 ENCOUNTER — Ambulatory Visit
Admission: RE | Admit: 2022-01-17 | Discharge: 2022-01-17 | Disposition: A | Discharge status: Home / Self Care | Payer: Medicare HMO | Source: Ambulatory Visit | Attending: Family Medicine | Admitting: Family Medicine

## 2022-01-17 DIAGNOSIS — M25561 Pain in right knee: Secondary | ICD-10-CM | POA: Diagnosis not present

## 2022-01-21 ENCOUNTER — Telehealth: Payer: Self-pay | Admitting: Family Medicine

## 2022-01-21 DIAGNOSIS — M25561 Pain in right knee: Secondary | ICD-10-CM

## 2022-01-21 NOTE — Telephone Encounter (Signed)
LVM for pt to CB regarding results.  

## 2022-01-21 NOTE — Telephone Encounter (Signed)
Please call patient: His x-rays resulted with narrowing on the medial aspect of his knee which could indicate he injured the meniscus/cartilage which is located in this position versus chronic arthritis and degeneration at this location.  There was fluid on his knee suggesting either an arthritic flare or we see effusions with injury such as meniscal injury.  I had prescribed him prednisone to help calm down the inflammatory response.  I would like to see him back in 1 week to further evaluate and decide if we need to move forward with evaluation and treatment of cause, since x-rays are not normal.

## 2022-01-22 DIAGNOSIS — Z03818 Encounter for observation for suspected exposure to other biological agents ruled out: Secondary | ICD-10-CM | POA: Diagnosis not present

## 2022-01-22 DIAGNOSIS — J159 Unspecified bacterial pneumonia: Secondary | ICD-10-CM | POA: Diagnosis not present

## 2022-01-22 NOTE — Telephone Encounter (Signed)
LVM for pt to CB regarding results.  

## 2022-01-23 NOTE — Telephone Encounter (Signed)
LVM for pt to CB regarding results.  

## 2022-02-21 ENCOUNTER — Ambulatory Visit: Payer: Medicare HMO | Admitting: Physician Assistant

## 2022-02-21 NOTE — Progress Notes (Deleted)
?Cardiology Office Note:   ? ?Date:  02/21/2022  ? ?ID:  [name], DOB [birth year], MRN [id number] ? ?PCP:  [name], DO  ?[facility] Cardiologist:  Jenkins Rouge, MD  ?[place] Electrophysiologist:  None  ? ?Chief Complaint:  ? ?History of Present Illness:   ? ?[name] is a 76 y.o. male with a hx of CAD s/p LAD stent in 2007, hypertension, hyperlipidemia, diabetes mellitus, CKD, depression, COPD, remote smoking, and Barrett's esophagus with GI bleed seen for yearly follow-up. ? ?Myoview was normal in 2014. ?Carotid Doppler October 2021: 1 to 39% bilateral ICA stenosis. ? ?Last seen by Dr. Johnsie Cancel February 2022. ? ?Here today for follow-up. ? ?Past Medical History:  ?Diagnosis Date  ? Achilles tendinitis of left lower extremity 07/20/2018  ? Adenomatous colon polyp 2011  ? Barrett esophagus 12/2013  ? EGD  ? CAD (coronary artery disease)   ? ETT/Lexiscan-Myoview (11/14):  Normal; no ischemia, EF 67%  ? CKD (chronic kidney disease), stage III ([place])   ? Colon polyp 09/2010  ? adenoma  ? COPD (chronic obstructive pulmonary disease) ([place]) 2009  ? spirometry with "moderate COPD"  ? Depression   ? Diabetes mellitus without complication ([place])   ? Dyspnea   ? GERD (gastroesophageal reflux disease)   ? GI bleed   ? after polypectomy/admitted  ? Hair loss 08/07/2018  ? HTN (hypertension)   ? Hypercholesterolemia   ? Memory loss   ? Myocardial infarction [facility])   ? Obesity   ? Sleep apnea   ? Spontaneous pneumothorax   ? Synovial cyst of right popliteal space 07/20/2018  ? Vertigo   ? ? ?Past Surgical History:  ?Procedure Laterality Date  ? APPENDECTOMY    ? CORONARY ANGIOPLASTY WITH STENT PLACEMENT  2007  ? LAD  ? PLEURAL SCARIFICATION  1987  ? TONSILLECTOMY    ? VARICOCELECTOMY    ? ? ?Current Medications: ?No outpatient medications have been marked as taking for the 02/21/22 encounter (Appointment) with Leanor Kail, [place].  ?  ? ?Allergies:   Altace [ramipril], Codeine, Losartan, Remeron  [mirtazapine], Tape, and Zantac [ranitidine hcl]  ? ?Social History  ? ?Socioeconomic History  ? Marital status: Married  ?  Spouse name: Not on file  ? Number of children: 2  ? Years of education: come college  ? Highest education level: Not on file  ?Occupational History  ? Occupation: retired Furniture conservator/restorer  ?Tobacco Use  ? Smoking status: Former  ?  Types: Cigarettes  ?  Quit date: 11/25/1998  ?  Years since quitting: 23.2  ? Smokeless tobacco: Never  ?Vaping Use  ? Vaping Use: Never used  ?Substance and Sexual Activity  ? Alcohol use: No  ? Drug use: No  ? Sexual activity: Not Currently  ?Other Topics Concern  ? Not on file  ?Social History Narrative  ? Lives at home with wife. Married to [name].  ? Retired Production designer, theatre/television/film truck.   ? Drinks caffeine (3 cups coffee, 2 sodas per day),  Takes a daily vitamin, wears a seatbelt  ? Wears a bicycle helmet.   ? Has dentures (partial on top)  ? Smoke detector in the home. No firearms in the home.   ? Feels safe in relationships.    ? Right-handed.  ? ?Social Determinants of Health  ? ?Financial Resource Strain: Low Risk   ? Difficulty of Paying Living Expenses: Not hard at all  ?Food Insecurity: No Food Insecurity  ? Worried About  Running Out of Food in the Last Year: Never true  ? Ran Out of Food in the Last Year: Never true  ?Transportation Needs: No Transportation Needs  ? Lack of Transportation (Medical): No  ? Lack of Transportation (Non-Medical): No  ?Physical Activity: Inactive  ? Days of Exercise per Week: 0 days  ? Minutes of Exercise per Session: 0 min  ?Stress: No Stress Concern Present  ? Feeling of Stress : Only a little  ?Social Connections: Moderately Isolated  ? Frequency of Communication with Friends and Family: More than three times a week  ? Frequency of Social Gatherings with Friends and Family: More than three times a week  ? Attends Religious Services: Never  ? Active Member of Clubs or Organizations: No  ? Attends Archivist Meetings: Never  ? Marital  Status: Married  ?  ? ?Family History: ?The patient's family history includes CVA in his mother; Cancer in his sister; Cancer - Other in his brother; Colon polyps in his sister; Coronary artery disease (age of onset: 85) in his father; Dementia in his mother and sister; Heart disease in his father and mother; Hyperlipidemia in an other family member; Hypertension in an other family member; Stroke in his father.  *** ? ?ROS:   ?Please see the history of present illness.    ?All other systems reviewed and are negative. *** ? ?EKGs/Labs/Other Studies Reviewed:   ? ?The following studies were reviewed today: ?As summarized above ? ?EKG:  EKG is *** ordered today.  The ekg ordered today demonstrates *** ? ?Recent Labs: ?01/09/2022: ALT 30; BUN 20; Creat 1.48; Hemoglobin 12.8; Platelets 218; Potassium 4.4; Sodium 139; TSH 1.69  ?Recent Lipid Panel ?   ?Component Value Date/Time  ? CHOL 120 01/09/2022 1512  ? TRIG 262 (H) 01/09/2022 1512  ? HDL 44 01/09/2022 1512  ? CHOLHDL 2.7 01/09/2022 1512  ? VLDL 45.6 (H) 11/09/2020 1150  ? LDLCALC 45 01/09/2022 1512  ? LDLDIRECT 58.0 11/09/2020 1150  ? ? ? ?Risk Assessment/Calculations:   ?{Does this patient have ATRIAL FIBRILLATION?:[phone]} ? ? ?Physical Exam:   ? ?VS:  There were no vitals taken for this visit.   ? ?Wt Readings from Last 3 Encounters:  ?01/09/22 209 lb (94.8 kg)  ?11/28/21 213 lb 12.8 oz (97 kg)  ?09/07/21 211 lb (95.7 kg)  ?  ? ?GEN: *** Well nourished, well developed in no acute distress ?HEENT: Normal ?NECK: No JVD; No carotid bruits ?LYMPHATICS: No lymphadenopathy ?CARDIAC: ***RRR, no murmurs, rubs, gallops ?RESPIRATORY:  Clear to auscultation without rales, wheezing or rhonchi  ?ABDOMEN: Soft, non-tender, non-distended ?MUSCULOSKELETAL:  No edema; No deformity  ?SKIN: Warm and dry ?NEUROLOGIC:  Alert and oriented x 3 ?PSYCHIATRIC:  Normal affect  ? ?ASSESSMENT AND PLAN:  ? ? ?Hypertension ? ?2.  CAD ? ?3.  Hyperlipidemia ? ?4.  Diabetes  mellitus ? ?Medication Adjustments/Labs and Tests Ordered: ?Current medicines are reviewed at length with the patient today.  Concerns regarding medicines are outlined above.  ?No orders of the defined types were placed in this encounter. ? ?No orders of the defined types were placed in this encounter. ? ? ?There are no Patient Instructions on file for this visit.  ? ?Signed, ?Leanor Kail, [place]  ?02/21/2022 12:41 PM    ?[place] ?

## 2022-04-04 ENCOUNTER — Encounter: Payer: Self-pay | Admitting: Hematology

## 2022-04-04 ENCOUNTER — Ambulatory Visit: Payer: Medicare HMO | Admitting: Physician Assistant

## 2022-04-04 ENCOUNTER — Encounter: Payer: Self-pay | Admitting: Physician Assistant

## 2022-04-04 VITALS — BP 120/70 | HR 61 | Ht 70.0 in | Wt 202.0 lb

## 2022-04-04 DIAGNOSIS — I10 Essential (primary) hypertension: Secondary | ICD-10-CM | POA: Diagnosis not present

## 2022-04-04 DIAGNOSIS — I2510 Atherosclerotic heart disease of native coronary artery without angina pectoris: Secondary | ICD-10-CM | POA: Diagnosis not present

## 2022-04-04 DIAGNOSIS — E782 Mixed hyperlipidemia: Secondary | ICD-10-CM

## 2022-04-04 NOTE — Progress Notes (Signed)
?Cardiology Office Note:   ? ?Date:  04/04/2022  ? ?ID:  [name], DOB [birth year], MRN [id number] ? ?PCP:  [name], DO  ?[facility] Cardiologist:  Jenkins Rouge, MD  ?[place] Electrophysiologist:  None  ? ?Chief Complaint: Yearly follow-up ? ?History of Present Illness:   ? ?[name] is a 76 y.o. male with a hx of CAD s/p LAD stent in 2007, COPD, depression, hypertension, hyperlipidemia, diabetes mellitus, Barrett's esophagus with prior GI bleed and CKD presents for follow-up. ? ?Last Myoview in 2014 was without ischemia. ?Last seen by Dr. Johnsie Cancel February 2022. ? ?Patient is here for follow-up.  No regular exercise but does yard work without chest pain or shortness of breath.  Denies palpitation, orthopnea, PND, syncope, lower extremity edema or melena.  Compliant with medications. ? ?Past Medical History:  ?Diagnosis Date  ? Achilles tendinitis of left lower extremity 07/20/2018  ? Adenomatous colon polyp 2011  ? Barrett esophagus 12/2013  ? EGD  ? CAD (coronary artery disease)   ? ETT/Lexiscan-Myoview (11/14):  Normal; no ischemia, EF 67%  ? CKD (chronic kidney disease), stage III ([place])   ? Colon polyp 09/2010  ? adenoma  ? COPD (chronic obstructive pulmonary disease) ([place]) 2009  ? spirometry with "moderate COPD"  ? Depression   ? Diabetes mellitus without complication ([place])   ? Dyspnea   ? GERD (gastroesophageal reflux disease)   ? GI bleed   ? after polypectomy/admitted  ? Hair loss 08/07/2018  ? HTN (hypertension)   ? Hypercholesterolemia   ? Memory loss   ? Myocardial infarction [facility])   ? Obesity   ? Sleep apnea   ? Spontaneous pneumothorax   ? Synovial cyst of right popliteal space 07/20/2018  ? Vertigo   ? ? ?Past Surgical History:  ?Procedure Laterality Date  ? APPENDECTOMY    ? CORONARY ANGIOPLASTY WITH STENT PLACEMENT  2007  ? LAD  ? PLEURAL SCARIFICATION  1987  ? TONSILLECTOMY    ? VARICOCELECTOMY    ? ? ?Current Medications: ?Current Meds  ?Medication Sig  ? amLODipine (NORVASC)  2.5 MG tablet Take 1 tablet (2.5 mg total) by mouth daily.  ? atorvastatin (LIPITOR) 80 MG tablet Take 1 tablet (80 mg total) by mouth daily.  ? buPROPion (WELLBUTRIN XL) 300 MG 24 hr tablet Take 1 tablet (300 mg total) by mouth daily.  ? clopidogrel (PLAVIX) 75 MG tablet Take 1 tablet (75 mg total) by mouth daily. Please make overdue appt with Dr. Johnsie Cancel before anymore refills. Thank you 1st attempt  ? Cyanocobalamin (VITAMIN B 12 PO) Take by mouth.  ? dapagliflozin propanediol (FARXIGA) 5 MG TABS tablet Take 1 tablet (5 mg total) by mouth daily before breakfast.  ? famotidine (PEPCID) 20 MG tablet Take 1 tablet (20 mg total) by mouth 2 (two) times daily.  ? iron polysaccharides (NIFEREX) 150 MG capsule Take 1 capsule (150 mg total) by mouth daily.  ? metFORMIN (GLUCOPHAGE) 1000 MG tablet TAKE 1 TABLET (1,000 MG TOTAL) BY MOUTH DAILY WITH BREAKFAST.  ? metoprolol tartrate (LOPRESSOR) 50 MG tablet Take 1 tablet (50 mg total) by mouth daily.  ? Multiple Vitamin (MULTIVITAMIN) tablet Take 1 tablet by mouth daily.  ? MYRBETRIQ 25 MG TB24 tablet   ? nitroGLYCERIN (NITROSTAT) 0.4 MG SL tablet Place 1 tablet (0.4 mg total) under the tongue every 5 (five) minutes as needed for chest pain (3 doses max).  ? pantoprazole (PROTONIX) 40 MG tablet Take 1 tablet (  40 mg total) by mouth 2 (two) times daily.  ? PARoxetine (PAXIL) 40 MG tablet Take 1 tablet (40 mg total) by mouth daily.  ? predniSONE (DELTASONE) 20 MG tablet 60 mg x3d, 40 mg x3d, 20 mg x2d, 10 mg x2d  ? SYMBICORT 160-4.5 MCG/ACT inhaler Inhale 2 puffs into the lungs 2 (two) times daily.  ? tamsulosin (FLOMAX) 0.4 MG CAPS capsule   ? traZODone (DESYREL) 50 MG tablet Take 3 tablets (150 mg total) by mouth at bedtime.  ?  ? ?Allergies:   Altace [ramipril], Codeine, Losartan, Remeron [mirtazapine], Tape, and Zantac [ranitidine hcl]  ? ?Social History  ? ?Socioeconomic History  ? Marital status: Married  ?  Spouse name: Not on file  ? Number of children: 2  ? Years of  education: come college  ? Highest education level: Not on file  ?Occupational History  ? Occupation: retired Furniture conservator/restorer  ?Tobacco Use  ? Smoking status: Former  ?  Types: Cigarettes  ?  Quit date: 11/25/1998  ?  Years since quitting: 23.3  ? Smokeless tobacco: Never  ?Vaping Use  ? Vaping Use: Never used  ?Substance and Sexual Activity  ? Alcohol use: No  ? Drug use: No  ? Sexual activity: Not Currently  ?Other Topics Concern  ? Not on file  ?Social History Narrative  ? Lives at home with wife. Married to [name].  ? Retired Production designer, theatre/television/film truck.   ? Drinks caffeine (3 cups coffee, 2 sodas per day),  Takes a daily vitamin, wears a seatbelt  ? Wears a bicycle helmet.   ? Has dentures (partial on top)  ? Smoke detector in the home. No firearms in the home.   ? Feels safe in relationships.    ? Right-handed.  ? ?Social Determinants of Health  ? ?Financial Resource Strain: Low Risk   ? Difficulty of Paying Living Expenses: Not hard at all  ?Food Insecurity: No Food Insecurity  ? Worried About Charity fundraiser in the Last Year: Never true  ? Ran Out of Food in the Last Year: Never true  ?Transportation Needs: No Transportation Needs  ? Lack of Transportation (Medical): No  ? Lack of Transportation (Non-Medical): No  ?Physical Activity: Inactive  ? Days of Exercise per Week: 0 days  ? Minutes of Exercise per Session: 0 min  ?Stress: No Stress Concern Present  ? Feeling of Stress : Only a little  ?Social Connections: Moderately Isolated  ? Frequency of Communication with Friends and Family: More than three times a week  ? Frequency of Social Gatherings with Friends and Family: More than three times a week  ? Attends Religious Services: Never  ? Active Member of Clubs or Organizations: No  ? Attends Archivist Meetings: Never  ? Marital Status: Married  ?  ? ?Family History: ?The patient's family history includes CVA in his mother; Cancer in his sister; Cancer - Other in his brother; Colon polyps in his sister; Coronary  artery disease (age of onset: 33) in his father; Dementia in his mother and sister; Heart disease in his father and mother; Hyperlipidemia in an other family member; Hypertension in an other family member; Stroke in his father.   ? ?ROS:   ?Please see the history of present illness.    ?All other systems reviewed and are negative.  ? ?EKGs/Labs/Other Studies Reviewed:   ? ?The following studies were reviewed today: ? ?Carotid doppler 08/2020 ?Summary:  ?Right Carotid: Velocities in the right ICA  are consistent with a 1-39%  ?stenosis.  ? ?Left Carotid: Velocities in the left ICA are consistent with a 1-39%  ?stenosis.  ? ?EKG:  EKG is  ordered today.  The ekg ordered today demonstrates normal sinus rhythm ? ?Recent Labs: ?01/09/2022: ALT 30; BUN 20; Creat 1.48; Hemoglobin 12.8; Platelets 218; Potassium 4.4; Sodium 139; TSH 1.69  ?Recent Lipid Panel ?   ?Component Value Date/Time  ? CHOL 120 01/09/2022 1512  ? TRIG 262 (H) 01/09/2022 1512  ? HDL 44 01/09/2022 1512  ? CHOLHDL 2.7 01/09/2022 1512  ? VLDL 45.6 (H) 11/09/2020 1150  ? LDLCALC 45 01/09/2022 1512  ? LDLDIRECT 58.0 11/09/2020 1150  ? ? ? ?Physical Exam:   ? ?VS:  BP 120/70 (BP Location: Left Arm, Patient Position: Sitting, Cuff Size: Normal)   Pulse 61   Ht '5\' 10"'$  (1.778 m)   Wt 202 lb (91.6 kg)   BMI 28.98 kg/m?    ? ?Wt Readings from Last 3 Encounters:  ?04/04/22 202 lb (91.6 kg)  ?01/09/22 209 lb (94.8 kg)  ?11/28/21 213 lb 12.8 oz (97 kg)  ?  ? ?GEN:  Well nourished, well developed in no acute distress ?HEENT: Normal ?NECK: No JVD; No carotid bruits ?LYMPHATICS: No lymphadenopathy ?CARDIAC: RRR, no murmurs, rubs, gallops ?RESPIRATORY:  Clear to auscultation without rales, wheezing or rhonchi  ?ABDOMEN: Soft, non-tender, non-distended ?MUSCULOSKELETAL:  No edema; No deformity  ?SKIN: Warm and dry ?NEUROLOGIC:  Alert and oriented x 3 ?PSYCHIATRIC:  Normal affect  ? ?ASSESSMENT AND PLAN:  ? ? ?CAD s/p LAD stent in 2007 ?No angina.  On Plavix as  monotherapy.  Previously on aspirin which was discontinued after GI bleed.  Continue statin and beta-blocker. ? ?2.  Hypertension ?Blood pressure stable and controlled on current medications ? ?3.  Hyperlipidemia ?2/15/2

## 2022-04-04 NOTE — Patient Instructions (Signed)
Medication Instructions:  ?Your physician recommends that you continue on your current medications as directed. Please refer to the Current Medication list given to you today. ?*If you need a refill on your cardiac medications before your next appointment, please call your pharmacy* ? ? ?Lab Work: ?None ordered ?If you have labs (blood work) drawn today and your tests are completely normal, you will receive your results only by: ?MyChart Message (if you have MyChart) OR ?A paper copy in the mail ?If you have any lab test that is abnormal or we need to change your treatment, we will call you to review the results. ? ? ?Testing/Procedures: ?None ordered ? ? ?Follow-Up: ?At [facility], you and your health needs are our priority.  As part of our continuing mission to provide you with exceptional heart care, we have created designated Provider Care Teams.  These Care Teams include your primary Cardiologist (physician) and Advanced Practice Providers (APPs -  Physician Assistants and Nurse Practitioners) who all work together to provide you with the care you need, when you need it. ? ?We recommend signing up for the patient portal called "MyChart".  Sign up information is provided on this After Visit Summary.  MyChart is used to connect with patients for Virtual Visits (Telemedicine).  Patients are able to view lab/test results, encounter notes, upcoming appointments, etc.  Non-urgent messages can be sent to your provider as well.   ?To learn more about what you can do with MyChart, go to [email or web address].   ? ?Your next appointment:   ?1 year(s) ? ?The format for your next appointment:   ?In Person ? ?Provider:   ?Jenkins Rouge, MD   ? ? ?Other Instructions ? ? ?Important Information About Sugar ? ? ? ? ?  ?

## 2022-04-17 DIAGNOSIS — R69 Illness, unspecified: Secondary | ICD-10-CM | POA: Diagnosis not present

## 2022-06-22 ENCOUNTER — Other Ambulatory Visit: Payer: Self-pay | Admitting: Family Medicine

## 2022-07-07 ENCOUNTER — Other Ambulatory Visit: Payer: Self-pay | Admitting: Family Medicine

## 2022-07-07 DIAGNOSIS — F3341 Major depressive disorder, recurrent, in partial remission: Secondary | ICD-10-CM

## 2022-08-15 ENCOUNTER — Other Ambulatory Visit: Payer: Self-pay | Admitting: Family Medicine

## 2022-08-23 ENCOUNTER — Ambulatory Visit (INDEPENDENT_AMBULATORY_CARE_PROVIDER_SITE_OTHER): Payer: Medicare HMO | Admitting: Family Medicine

## 2022-08-23 DIAGNOSIS — Z91199 Patient's noncompliance with other medical treatment and regimen due to unspecified reason: Secondary | ICD-10-CM

## 2022-08-23 NOTE — Patient Instructions (Signed)
No follow-ups on file.        Great to see you today.  I have refilled the medication(s) we provide.   If labs were collected, we will inform you of lab results once received either by echart message or telephone call.   - echart message- for normal results that have been seen by the patient already.   - telephone call: abnormal results or if patient has not viewed results in their echart.  

## 2022-08-23 NOTE — Progress Notes (Unsigned)
   Same day cancel 

## 2022-08-27 ENCOUNTER — Ambulatory Visit (INDEPENDENT_AMBULATORY_CARE_PROVIDER_SITE_OTHER): Payer: Medicare HMO | Admitting: Family Medicine

## 2022-08-27 ENCOUNTER — Encounter: Payer: Self-pay | Admitting: Family Medicine

## 2022-08-27 VITALS — BP 136/81 | HR 71 | Temp 98.5°F | Wt 203.0 lb

## 2022-08-27 DIAGNOSIS — K2100 Gastro-esophageal reflux disease with esophagitis, without bleeding: Secondary | ICD-10-CM | POA: Diagnosis not present

## 2022-08-27 DIAGNOSIS — Z23 Encounter for immunization: Secondary | ICD-10-CM | POA: Diagnosis not present

## 2022-08-27 DIAGNOSIS — I252 Old myocardial infarction: Secondary | ICD-10-CM | POA: Diagnosis not present

## 2022-08-27 DIAGNOSIS — G319 Degenerative disease of nervous system, unspecified: Secondary | ICD-10-CM

## 2022-08-27 DIAGNOSIS — I10 Essential (primary) hypertension: Secondary | ICD-10-CM

## 2022-08-27 DIAGNOSIS — R609 Edema, unspecified: Secondary | ICD-10-CM

## 2022-08-27 DIAGNOSIS — I2510 Atherosclerotic heart disease of native coronary artery without angina pectoris: Secondary | ICD-10-CM

## 2022-08-27 DIAGNOSIS — E785 Hyperlipidemia, unspecified: Secondary | ICD-10-CM

## 2022-08-27 DIAGNOSIS — F330 Major depressive disorder, recurrent, mild: Secondary | ICD-10-CM | POA: Diagnosis not present

## 2022-08-27 DIAGNOSIS — I6782 Cerebral ischemia: Secondary | ICD-10-CM

## 2022-08-27 DIAGNOSIS — R972 Elevated prostate specific antigen [PSA]: Secondary | ICD-10-CM

## 2022-08-27 DIAGNOSIS — E1169 Type 2 diabetes mellitus with other specified complication: Secondary | ICD-10-CM | POA: Diagnosis not present

## 2022-08-27 DIAGNOSIS — R140 Abdominal distension (gaseous): Secondary | ICD-10-CM

## 2022-08-27 DIAGNOSIS — R197 Diarrhea, unspecified: Secondary | ICD-10-CM

## 2022-08-27 DIAGNOSIS — D6869 Other thrombophilia: Secondary | ICD-10-CM

## 2022-08-27 DIAGNOSIS — N1832 Chronic kidney disease, stage 3b: Secondary | ICD-10-CM

## 2022-08-27 DIAGNOSIS — F3341 Major depressive disorder, recurrent, in partial remission: Secondary | ICD-10-CM

## 2022-08-27 LAB — POCT GLYCOSYLATED HEMOGLOBIN (HGB A1C)
HbA1c POC (<> result, manual entry): 8.1 % (ref 4.0–5.6)
HbA1c, POC (controlled diabetic range): 8.1 % — AB (ref 0.0–7.0)
HbA1c, POC (prediabetic range): 8.1 % — AB (ref 5.7–6.4)
Hemoglobin A1C: 8.1 % — AB (ref 4.0–5.6)

## 2022-08-27 MED ORDER — TRAZODONE HCL 50 MG PO TABS: 150.0000 mg | ORAL_TABLET | Freq: Every day | ORAL | 1 refills | Status: DC

## 2022-08-27 MED ORDER — SYMBICORT 160-4.5 MCG/ACT IN AERO: 2.0000 | INHALATION_SPRAY | Freq: Two times a day (BID) | RESPIRATORY_TRACT | 4 refills | Status: DC

## 2022-08-27 MED ORDER — PANTOPRAZOLE SODIUM 40 MG PO TBEC: 40.0000 mg | DELAYED_RELEASE_TABLET | Freq: Two times a day (BID) | ORAL | 3 refills | Status: DC

## 2022-08-27 MED ORDER — METOPROLOL TARTRATE 50 MG PO TABS: 50.0000 mg | ORAL_TABLET | Freq: Every day | ORAL | 1 refills | Status: DC

## 2022-08-27 MED ORDER — FAMOTIDINE 20 MG PO TABS: 20.0000 mg | ORAL_TABLET | Freq: Two times a day (BID) | ORAL | 3 refills | Status: DC

## 2022-08-27 MED ORDER — BUPROPION HCL ER (XL) 300 MG PO TB24: 300.0000 mg | ORAL_TABLET | Freq: Every day | ORAL | 1 refills | Status: DC

## 2022-08-27 MED ORDER — AMLODIPINE BESYLATE 5 MG PO TABS: 5.0000 mg | ORAL_TABLET | Freq: Every day | ORAL | 1 refills | Status: DC

## 2022-08-27 MED ORDER — ATORVASTATIN CALCIUM 80 MG PO TABS: 80.0000 mg | ORAL_TABLET | Freq: Every day | ORAL | 3 refills | Status: DC

## 2022-08-27 MED ORDER — PAROXETINE HCL 40 MG PO TABS: 40.0000 mg | ORAL_TABLET | Freq: Every day | ORAL | 1 refills | Status: DC

## 2022-08-27 NOTE — Patient Instructions (Addendum)
Return in about 15 weeks (around 12/10/2022) for Routine chronic condition follow-up.  Call your [facility] gastroenterology- you are due for colonoscopy.        Great to see you today.  I have refilled the medication(s) we provide.   If labs were collected, we will inform you of lab results once received either by echart message or telephone call.   - echart message- for normal results that have been seen by the patient already.   - telephone call: abnormal results or if patient has not viewed results in their echart.

## 2022-08-27 NOTE — Progress Notes (Signed)
[name] , 18-Jul-1946, 76 y.o., male MRN: [id number] Patient Care Team    Relationship Specialty Notifications Start End  [name], DO PCP - General Family Medicine  12/25/15   Josue Hector, MD PCP - Cardiology Cardiology Admissions 08/20/18   Brunetta Genera, MD Consulting Physician Hematology  09/03/16   Josue Hector, MD Consulting Physician Cardiology  09/03/16   Wilford Corner, MD Consulting Physician Gastroenterology  09/03/16   Melina Schools, OD  Optometry  09/03/16   Kathie Rhodes, MD (Inactive) Consulting Physician Urology  09/10/16   Hollar, Katharine Look, MD Referring Physician Dermatology  06/29/19     Chief Complaint  Patient presents with   Diabetes    cmc    Subjective: [name] is a 76 y.o. male present for [facility] Recurrent major depressive disorder, in partial remission (HCC)/memory changes Patient feels that his depression is currently well controlled.  He reports compliance with trazodone 150 mg nightly, Paxil 40 mg daily and Wellbutrin 300 mg daily. Prior note: He was having some increase in symptoms, but also experiencing mild cognitive changes. .  Current medication regimen of trazodone, paxil and Wellbutrin. He denies negative side effects occasions.  He and his wife both have noticed he is having some memory changes which have him concerned- which has mildly improved since stopping Remeron and starting low dose paxil.  He reports both his mother and his sisters had dementia.  He feels he is becoming more forgetful.  His wife has told him he will talk about the same things that they had already talked about prior.  Gastroesophageal reflux disease, esophagitis presence not specified/diarrhea/bloating  EGD 03/2016 by Dr. Michail Sermon with signs of barrett's esophagus. Patient was tried on lower dose medication, and was unable to tolerate secondary to return of symptoms.  His symptoms are well controlled with protonix 40 BID and pepcid BID.  He  is due for his colonoscopy and reports he is having more frequent diarrhea and bloating.  COPD [facility]) He uses Symbicort daily and feels it is working well for him. Spiriva needed during certain times of the year only but has not used in quite some time. His pneumonia series is completed.  Symptoms are well controlled on Symbicort only as needed now.  Atherosclerosis of native coronary artery of native heart without angina pectoris/Essential hypertension/HYPERCHOLESTEROLEMIA/History of MI (myocardial infarction)/IDA He has known CAD with prior LAD stent from 2007. Pt reports compliance with metoprolol 50 mg daily and amlodipine 2.5 mg daily Qd, losartan 100 mg daily.Patient denies chest pain, shortness of breath, dizziness or lower extremity edema.   Pt takes a daily Plavix. Pt is  prescribed statin and followed by Dr. Johnsie Cancel Cardiology.  Diet: Low-sodium RF: Hypertension, hyperlipidemia, diabetes  Type 2 diabetes mellitus without complication, without long-term current use of insulin ([place]) patient reports science with metformin 1000 mg Qd.he reports he never started the [place], or if he did he stopped taking it he cannot recall.  He only takes metformin currently. Patient denies dizziness, hyperglycemic or hypoglycemic events. Patient denies numbness, tingling in the extremities or nonhealing wounds of feet.      08/27/2022    3:23 PM 11/28/2021   11:02 AM 09/07/2021   11:06 AM 05/09/2021   10:25 AM 03/08/2021   11:04 AM  Depression screen PHQ 2/9  Decreased Interest 2 0 3 0 3  Down, Depressed, Hopeless _0 PHQ - 2 Score 4 1 6  3 6  Altered sleeping _0 Tired, decreased energy 3  3 0 0  Change in appetite 2  2 0 0  Feeling bad or failure about yourself  1  0 0 1  Trouble concentrating _1 Moving slowly or fidgety/restless 1  0 3 1  Suicidal thoughts 0  0 0 0  PHQ-9 Score _2 Difficult doing work/chores    Somewhat difficult Somewhat difficult       08/27/2022    3:24 PM 09/07/2021   11:07 AM 03/08/2021   11:05 AM 02/12/2019    2:48 PM  GAD 7 : Generalized Anxiety Score  Nervous, Anxious, on Edge 0 2 3 0  Control/stop worrying _3 Worry too much - different things _4 Trouble relaxing _5 Restless _6 Easily annoyed or irritable _7 Afraid - awful might happen 0 0 0 0  Total GAD 7 Score _8 Anxiety Difficulty    Somewhat difficult    Allergies  Allergen Reactions   Altace [Ramipril] Cough   Codeine     Makes sick   Losartan Cough    cough   Remeron [Mirtazapine] Other (See Comments)    Cognitive changes   Tape     Blisters    Zantac [Ranitidine Hcl] Dermatitis   Social History   Tobacco Use   Smoking status: Former    Types: Cigarettes    Quit date: 11/25/1998    Years since quitting: 23.7   Smokeless tobacco: Never  Substance Use Topics   Alcohol use: No   Past Medical History:  Diagnosis Date   Achilles tendinitis of left lower extremity 07/20/2018   Adenomatous colon polyp 2011   Barrett esophagus 12/2013   EGD   CAD (coronary artery disease)    ETT/Lexiscan-Myoview (11/14):  Normal; no ischemia, EF 67%   CKD (chronic kidney disease), stage III (HCC)    Colon polyp 09/2010   adenoma   COPD (chronic obstructive pulmonary disease) ([place]) 2009   spirometry with "moderate COPD"   Depression    Diabetes mellitus without complication (HCC)    Dyspnea    GERD (gastroesophageal reflux disease)    GI bleed    after polypectomy/admitted   Hair loss 08/07/2018   HTN (hypertension)    Hypercholesterolemia    Memory loss    Myocardial infarction ([place])    Obesity    Sleep apnea    Spontaneous pneumothorax    Synovial cyst of right popliteal space 07/20/2018   Vertigo    Past Surgical History:  Procedure Laterality Date   APPENDECTOMY     CORONARY ANGIOPLASTY WITH STENT PLACEMENT  2007   LAD   PLEURAL SCARIFICATION  1987   TONSILLECTOMY     VARICOCELECTOMY     Family  History  Problem Relation Age of Onset   Heart disease Mother    CVA Mother    Dementia Mother    Coronary artery disease Father 58   Heart disease Father    Stroke Father    Dementia Sister    Cancer - Other Brother    Hypertension Other    Hyperlipidemia Other    Colon polyps Sister    Cancer Sister    Allergies as of 08/27/2022       Reactions   Altace [ramipril]  Cough   Codeine    Makes sick   Losartan Cough   cough   Remeron [mirtazapine] Other (See Comments)   Cognitive changes   Tape    Blisters   Zantac [ranitidine Hcl] Dermatitis        Medication List        Accurate as of August 27, 2022  4:22 PM. If you have any questions, ask your nurse or doctor.          STOP taking these medications    dapagliflozin propanediol 5 MG Tabs tablet Commonly known as: FARXIGA Stopped by: Howard Pouch, DO       TAKE these medications    amLODipine 5 MG tablet Commonly known as: NORVASC Take 1 tablet (5 mg total) by mouth daily. What changed:  medication strength how much to take Changed by: Howard Pouch, DO   atorvastatin 80 MG tablet Commonly known as: LIPITOR Take 1 tablet (80 mg total) by mouth daily.   buPROPion 300 MG 24 hr tablet Commonly known as: WELLBUTRIN XL Take 1 tablet (300 mg total) by mouth daily.   clopidogrel 75 MG tablet Commonly known as: PLAVIX Take 1 tablet (75 mg total) by mouth daily. Please make overdue appt with Dr. Johnsie Cancel before anymore refills. Thank you 1st attempt   famotidine 20 MG tablet Commonly known as: Pepcid Take 1 tablet (20 mg total) by mouth 2 (two) times daily.   iron polysaccharides 150 MG capsule Commonly known as: NIFEREX Take 1 capsule (150 mg total) by mouth daily.   metFORMIN 1000 MG tablet Commonly known as: GLUCOPHAGE TAKE 1 TABLET (1,000 MG TOTAL) BY MOUTH DAILY WITH BREAKFAST.   metoprolol tartrate 50 MG tablet Commonly known as: LOPRESSOR Take 1 tablet (50 mg total) by mouth daily.    multivitamin tablet Take 1 tablet by mouth daily.   Myrbetriq 25 MG Tb24 tablet Generic drug: mirabegron ER   nitroGLYCERIN 0.4 MG SL tablet Commonly known as: NITROSTAT Place 1 tablet (0.4 mg total) under the tongue every 5 (five) minutes as needed for chest pain (3 doses max).   pantoprazole 40 MG tablet Commonly known as: PROTONIX Take 1 tablet (40 mg total) by mouth 2 (two) times daily.   PARoxetine 40 MG tablet Commonly known as: PAXIL Take 1 tablet (40 mg total) by mouth daily.   Symbicort 160-4.5 MCG/ACT inhaler Generic drug: budesonide-formoterol Inhale 2 puffs into the lungs 2 (two) times daily.   tamsulosin 0.4 MG Caps capsule Commonly known as: FLOMAX   traZODone 50 MG tablet Commonly known as: DESYREL Take 3 tablets (150 mg total) by mouth at bedtime.   VITAMIN B 12 PO Take by mouth.        No results found.   ROS: Negative, with the exception of above mentioned in HPI   Objective:  BP 136/81   Pulse 71   Temp 98.5 F (36.9 C)   Wt 203 lb (92.1 kg)   SpO2 97%   BMI 29.13 kg/m  Body mass index is 29.13 kg/m. Physical Exam Vitals and nursing note reviewed.  Constitutional:      General: He is not in acute distress.    Appearance: Normal appearance. He is not ill-appearing, toxic-appearing or diaphoretic.  HENT:     Head: Normocephalic and atraumatic.     Nose: No congestion or rhinorrhea.  Eyes:     General: No scleral icterus.       Right eye: No discharge.  Left eye: No discharge.     Extraocular Movements: Extraocular movements intact.     Pupils: Pupils are equal, round, and reactive to light.  Cardiovascular:     Rate and Rhythm: Normal rate and regular rhythm.     Heart sounds: No murmur heard.    No gallop.  Pulmonary:     Effort: Pulmonary effort is normal. No respiratory distress.     Breath sounds: Normal breath sounds. No wheezing, rhonchi or rales.  Abdominal:     General: There is distension.     Tenderness:  There is abdominal tenderness. There is no guarding or rebound.  Musculoskeletal:     Right lower leg: No edema.     Left lower leg: No edema.  Skin:    General: Skin is warm and dry.     Coloration: Skin is not jaundiced or pale.     Findings: No rash.  Neurological:     Mental Status: He is alert and oriented to person, place, and time. Mental status is at baseline.  Psychiatric:        Mood and Affect: Mood normal.        Behavior: Behavior normal.        Thought Content: Thought content normal.        Judgment: Judgment normal.    Diabetic Foot Exam - Simple   Simple Foot Form Diabetic Foot exam was performed with the following findings: Yes 08/27/2022  4:22 PM  Visual Inspection No deformities, no ulcerations, no other skin breakdown bilaterally: Yes Sensation Testing Intact to touch and monofilament testing bilaterally: Yes Pulse Check Posterior Tibialis and Dorsalis pulse intact bilaterally: Yes Comments      Results for orders placed or performed in visit on 08/27/22 (from the past 48 hour(s))  POCT HgB A1C     Status: Abnormal   Collection Time: 08/27/22  3:22 PM  Result Value Ref Range   Hemoglobin A1C 8.1 (A) 4.0 - 5.6 %   HbA1c POC (<> result, manual entry) 8.1 4.0 - 5.6 %   HbA1c, POC (prediabetic range) 8.1 (A) 5.7 - 6.4 %   HbA1c, POC (controlled diabetic range) 8.1 (A) 0.0 - 7.0 %     Assessment/Plan: ALANTE TOLAN is a 76 y.o. male present for OV for  Recurrent major depressive disorder, in partial remission (HCC) Stable Continue paxil to 40 mg Continue trazodone 2-3 tabs QHS PRN Continue wellbutrin 300 QD. -Tried meds: Remeron (possible memory changes) - Follow-up 4 months   Gastroesophageal reflux disease, esophagitis presence not specified Stable Continue Protonix 40 mg twice a day, was unable to take back secondary to recurrence of symptoms. Continue Pepcid twice daily today   Chronic bronchitis, unspecified chronic bronchitis type  (HCC) Stable Continue Symbicort twice daily  Atherosclerosis of native coronary artery of native heart without angina pectoris/Essential hypertension/HYPERCHOLESTEROLEMIA/History of MI (myocardial infarction) Stage 3 chronic kidney disease/ iron deficiency anemia/acquired thrombophilia Stable - following with cards, Dr. Johnsie Cancel. Continue atorvastatin Continue metoprolol Increase amlodipine to 5 mg daily -  plavix continue per cardiology - low sodium diet and exercise encouraged.  CMP collected today  Type 2 diabetes mellitus with hyperlipidemia/CKD 3 - A1c today 5.8--> 6.0-->6.1>>6.7> 6.5> 7.1 >7.1> 6.7> 6.8 >7.8> 7.5> 8.1 today.  Had been lost to follow-up. Goal of A1c less than 7 Waiting on pending lab, new medication regimen is going to be dependent upon kidney function. -Hopefully will be able to continue metformin to 1000 Qd> did not tolerate higher  dose He needs to start a second agent either [name] or Farxiga to help with kidney protection and control his diabetes.  [name] seems to be better favored on his plan. Continue atorvastatin 80 - Foot exam: Completed today 08/27/2022 - eye exam: Completed [facility]25/20[facility]attempt to get him scheduled at the eye screening through [facility] - PNA: Series completed - Flu: Completed today 08/27/2022 - Urine Microalbumin w/creat. Ratio: completed 08/27/2022  Cerebral atrophy/microvascular ischemic occlusive disease Patient is having mild cognitive changes. Referral had been placed to neurology Patient is on a statin  Iron deficiency anemia: Currently on supplementation Iron panel UTD due next visit  Abdominal bloating/diarrhea New problem. CMP and BMP collected today.  Abdomen does feel distended today.  He does not have fluid/edema of his lower extremities. Has lost weight over the last 6 months I have encouraged him to reach out to his GI team as well, he is overdue for his colonoscopy.  He reports he will call  them. If needing further intervention, we likely need to bring him back to discuss in more detail and consider imaging studies.  Influenza vaccine administered today  F/u [facility] 4 mos.   Reviewed expectations re: course of current medical issues. Discussed self-management of symptoms. Outlined signs and symptoms indicating need for more acute intervention. Patient verbalized understanding and all questions were answered. Patient received an After-Visit Summary.    Orders Placed This Encounter  Procedures   Flu Vaccine QUAD High Dose(Fluad)   B Nat Peptide   Comp Met (CMET)   Urine Microalbumin w/creat. ratio   POCT HgB A1C   Meds ordered this encounter  Medications   metoprolol tartrate (LOPRESSOR) 50 MG tablet    Sig: Take 1 tablet (50 mg total) by mouth daily.    Dispense:  90 tablet    Refill:  1   pantoprazole (PROTONIX) 40 MG tablet    Sig: Take 1 tablet (40 mg total) by mouth 2 (two) times daily.    Dispense:  180 tablet    Refill:  3   PARoxetine (PAXIL) 40 MG tablet    Sig: Take 1 tablet (40 mg total) by mouth daily.    Dispense:  90 tablet    Refill:  1   traZODone (DESYREL) 50 MG tablet    Sig: Take 3 tablets (150 mg total) by mouth at bedtime.    Dispense:  270 tablet    Refill:  1   SYMBICORT 160-4.5 MCG/ACT inhaler    Sig: Inhale 2 puffs into the lungs 2 (two) times daily.    Dispense:  3 each    Refill:  4   famotidine (PEPCID) 20 MG tablet    Sig: Take 1 tablet (20 mg total) by mouth 2 (two) times daily.    Dispense:  180 tablet    Refill:  3   buPROPion (WELLBUTRIN XL) 300 MG 24 hr tablet    Sig: Take 1 tablet (300 mg total) by mouth daily.    Dispense:  90 tablet    Refill:  1   atorvastatin (LIPITOR) 80 MG tablet    Sig: Take 1 tablet (80 mg total) by mouth daily.    Dispense:  90 tablet    Refill:  3   amLODipine (NORVASC) 5 MG tablet    Sig: Take 1 tablet (5 mg total) by mouth daily.    Dispense:  90 tablet    Refill:  1       electronically signed by:  Howard Pouch, DO  [facility] - [place]

## 2022-08-28 ENCOUNTER — Telehealth: Payer: Self-pay | Admitting: Family Medicine

## 2022-08-28 DIAGNOSIS — E1169 Type 2 diabetes mellitus with other specified complication: Secondary | ICD-10-CM

## 2022-08-28 LAB — MICROALBUMIN / CREATININE URINE RATIO
Creatinine, Urine: 254 mg/dL (ref 20–320)
Microalb Creat Ratio: 4 mcg/mg creat (ref <30)
Microalb, Ur: 1 mg/dL

## 2022-08-28 MED ORDER — EMPAGLIFLOZIN 25 MG PO TABS: 25.0000 mg | ORAL_TABLET | Freq: Every day | ORAL | 5 refills | Status: DC

## 2022-08-28 MED ORDER — METFORMIN HCL 1000 MG PO TABS: ORAL_TABLET | ORAL | 1 refills | Status: DC

## 2022-08-28 NOTE — Telephone Encounter (Signed)
Please call patient Liver function is normal. Kidney function is mildly decreased, but improved from last collection in February. Urinalysis is normal, normal protein levels  Is A1c/diabetes was elevated at 8.1.  We are waiting on his kidney function to return to guide him on medication.  He is to continue the metformin 1 tab daily before breakfast.  He is to start [name] also 1 tab daily before breakfast.  Make sure to have follow-up in 3 months.  The lab we collected to gauge if he has some fluid overload is still pending.  We will call him with this result if abnormal.  If he is still having bloating/diarrhea I would encourage him to call his gastroenterologist (since he is overdue for his colonoscopy) and if they are unable to see him soon, he can be seen here for that issue.

## 2022-08-28 NOTE — Telephone Encounter (Signed)
Pt advised of results. 

## 2022-08-29 ENCOUNTER — Other Ambulatory Visit: Payer: Self-pay | Admitting: Family Medicine

## 2022-08-29 DIAGNOSIS — F3341 Major depressive disorder, recurrent, in partial remission: Secondary | ICD-10-CM

## 2022-08-29 LAB — COMPREHENSIVE METABOLIC PANEL
AG Ratio: 1.6 (calc) (ref 1.0–2.5)
ALT: 33 U/L (ref 9–46)
AST: 17 U/L (ref 10–35)
Albumin: 4.2 g/dL (ref 3.6–5.1)
Alkaline phosphatase (APISO): 85 U/L (ref 35–144)
BUN/Creatinine Ratio: 13 (calc) (ref 6–22)
BUN: 18 mg/dL (ref 7–25)
CO2: 28 mmol/L (ref 20–32)
Calcium: 9.4 mg/dL (ref 8.6–10.3)
Chloride: 100 mmol/L (ref 98–110)
Creat: 1.39 mg/dL — ABNORMAL HIGH (ref 0.70–1.28)
Globulin: 2.7 g/dL (calc) (ref 1.9–3.7)
Glucose, Bld: 193 mg/dL — ABNORMAL HIGH (ref 65–99)
Potassium: 4.3 mmol/L (ref 3.5–5.3)
Sodium: 137 mmol/L (ref 135–146)
Total Bilirubin: 0.5 mg/dL (ref 0.2–1.2)
Total Protein: 6.9 g/dL (ref 6.1–8.1)

## 2022-08-29 LAB — BRAIN NATRIURETIC PEPTIDE

## 2022-08-29 LAB — EXTRA LAV TOP TUBE

## 2022-09-02 ENCOUNTER — Telehealth: Payer: Self-pay

## 2022-09-02 NOTE — Telephone Encounter (Signed)
Spoke with patient regarding results/recommendations.  

## 2022-09-02 NOTE — Telephone Encounter (Signed)
Patient wife [name] calling about medication. Patient was seen last week, Dr. Raoul Pitch prescribed [name].  Patient was already taking [name].  Is patient to take both Metformin and [name]?  Please call [phone]

## 2022-09-04 ENCOUNTER — Encounter: Payer: Self-pay | Admitting: Family Medicine

## 2022-09-05 ENCOUNTER — Other Ambulatory Visit: Payer: Self-pay | Admitting: Family Medicine

## 2022-09-05 DIAGNOSIS — F3341 Major depressive disorder, recurrent, in partial remission: Secondary | ICD-10-CM

## 2022-09-10 ENCOUNTER — Emergency Department (HOSPITAL_COMMUNITY)
Admission: EM | Admit: 2022-09-10 | Discharge: 2022-09-10 | Disposition: A | Discharge status: Home / Self Care | Payer: Medicare HMO | Attending: Emergency Medicine | Admitting: Emergency Medicine

## 2022-09-10 ENCOUNTER — Emergency Department (HOSPITAL_COMMUNITY): Payer: Medicare HMO

## 2022-09-10 ENCOUNTER — Other Ambulatory Visit: Payer: Self-pay

## 2022-09-10 DIAGNOSIS — R531 Weakness: Secondary | ICD-10-CM | POA: Diagnosis not present

## 2022-09-10 DIAGNOSIS — Z7984 Long term (current) use of oral hypoglycemic drugs: Secondary | ICD-10-CM | POA: Insufficient documentation

## 2022-09-10 DIAGNOSIS — R7989 Other specified abnormal findings of blood chemistry: Secondary | ICD-10-CM | POA: Insufficient documentation

## 2022-09-10 DIAGNOSIS — Z7902 Long term (current) use of antithrombotics/antiplatelets: Secondary | ICD-10-CM | POA: Insufficient documentation

## 2022-09-10 DIAGNOSIS — U071 COVID-19: Secondary | ICD-10-CM | POA: Insufficient documentation

## 2022-09-10 DIAGNOSIS — B349 Viral infection, unspecified: Secondary | ICD-10-CM | POA: Diagnosis not present

## 2022-09-10 DIAGNOSIS — Z955 Presence of coronary angioplasty implant and graft: Secondary | ICD-10-CM | POA: Diagnosis not present

## 2022-09-10 DIAGNOSIS — J449 Chronic obstructive pulmonary disease, unspecified: Secondary | ICD-10-CM | POA: Insufficient documentation

## 2022-09-10 DIAGNOSIS — Z79899 Other long term (current) drug therapy: Secondary | ICD-10-CM | POA: Insufficient documentation

## 2022-09-10 DIAGNOSIS — I10 Essential (primary) hypertension: Secondary | ICD-10-CM | POA: Diagnosis not present

## 2022-09-10 DIAGNOSIS — Z7951 Long term (current) use of inhaled steroids: Secondary | ICD-10-CM | POA: Diagnosis not present

## 2022-09-10 DIAGNOSIS — E119 Type 2 diabetes mellitus without complications: Secondary | ICD-10-CM | POA: Insufficient documentation

## 2022-09-10 DIAGNOSIS — I2510 Atherosclerotic heart disease of native coronary artery without angina pectoris: Secondary | ICD-10-CM | POA: Insufficient documentation

## 2022-09-10 DIAGNOSIS — R059 Cough, unspecified: Secondary | ICD-10-CM | POA: Diagnosis not present

## 2022-09-10 LAB — CBC
HCT: 36.4 % — ABNORMAL LOW (ref 39.0–52.0)
Hemoglobin: 11.8 g/dL — ABNORMAL LOW (ref 13.0–17.0)
MCH: 27.5 pg (ref 26.0–34.0)
MCHC: 32.4 g/dL (ref 30.0–36.0)
MCV: 84.8 fL (ref 80.0–100.0)
Platelets: 204 10*3/uL (ref 150–400)
RBC: 4.29 MIL/uL (ref 4.22–5.81)
RDW: 14 % (ref 11.5–15.5)
WBC: 6.9 10*3/uL (ref 4.0–10.5)
nRBC: 0 % (ref 0.0–0.2)

## 2022-09-10 LAB — URINALYSIS, ROUTINE W REFLEX MICROSCOPIC
Bacteria, UA: NONE SEEN
Bilirubin Urine: NEGATIVE
Glucose, UA: >=500 mg/dL — AB
Hgb urine dipstick: NEGATIVE
Ketones, ur: 20 mg/dL — AB
Leukocytes,Ua: NEGATIVE
Nitrite: NEGATIVE
Protein, ur: NEGATIVE mg/dL
Specific Gravity, Urine: 1.026 (ref 1.005–1.030)
pH: 5 (ref 5.0–8.0)

## 2022-09-10 LAB — COMPREHENSIVE METABOLIC PANEL
ALT: 28 U/L (ref 0–44)
AST: 22 U/L (ref 15–41)
Albumin: 4.1 g/dL (ref 3.5–5.0)
Alkaline Phosphatase: 78 U/L (ref 38–126)
Anion gap: 13 (ref 5–15)
BUN: 22 mg/dL (ref 8–23)
CO2: 21 mmol/L — ABNORMAL LOW (ref 22–32)
Calcium: 9.2 mg/dL (ref 8.9–10.3)
Chloride: 102 mmol/L (ref 98–111)
Creatinine, Ser: 1.63 mg/dL — ABNORMAL HIGH (ref 0.61–1.24)
GFR, Estimated: 44 mL/min — ABNORMAL LOW (ref >60)
Glucose, Bld: 168 mg/dL — ABNORMAL HIGH (ref 70–99)
Potassium: 4 mmol/L (ref 3.5–5.1)
Sodium: 136 mmol/L (ref 135–145)
Total Bilirubin: 1 mg/dL (ref 0.3–1.2)
Total Protein: 7.5 g/dL (ref 6.5–8.1)

## 2022-09-10 LAB — LIPASE, BLOOD: Lipase: 27 U/L (ref 11–51)

## 2022-09-10 LAB — RESP PANEL BY RT-PCR (FLU A&B, COVID) ARPGX2
Influenza A by PCR: NEGATIVE
Influenza B by PCR: NEGATIVE
SARS Coronavirus 2 by RT PCR: POSITIVE — AB

## 2022-09-10 MED ORDER — LACTATED RINGERS IV BOLUS
1000.0000 mL | Freq: Once | INTRAVENOUS | Status: AC
  Administered 2022-09-10: 1000 mL via INTRAVENOUS

## 2022-09-10 MED ORDER — ACETAMINOPHEN 325 MG PO TABS
650.0000 mg | ORAL_TABLET | Freq: Once | ORAL | Status: AC
  Administered 2022-09-10: 650 mg via ORAL
  Filled 2022-09-10: qty 2

## 2022-09-10 NOTE — ED Provider Triage Note (Signed)
Emergency Medicine Provider Triage Evaluation Note  [name] , a 76 y.o. male  was evaluated in triage.  Pt complains of anorexia and weakness. H/o DM typer II. Hasn't eating or drank anything in two days. Endorses diarrhea for over a year. Subjective fever at home. New cough started yesterday. Patient endorses poor appetite. Not diagnosed with dimentia but being actively evaluated for this by PCP. No chest pain or SOB. Endorsed abdominal pain located at the umbilicus. The pain comes and goes, non radiating.   Review of Systems  Positive: See above Negative: See above  Physical Exam  BP (!) 117/98   Pulse 96   Temp 99 F (37.2 C) (Oral)   Resp 18   Ht '5\' 10"'$  (1.778 m)   SpO2 92%   BMI 29.13 kg/m  Gen:   Awake, no distress, disoriented (wife states this is normal for him sometimes) Resp:  Normal effort  MSK:   Moves extremities without difficulty  Other:  Abdomen is soft, non tender, non distended  Medical Decision Making  Medically screening exam initiated at 9:37 AM.  Appropriate orders placed.  [name] was informed that the remainder of the evaluation will be completed by another provider, this initial triage assessment does not replace that evaluation, and the importance of remaining in the ED until their evaluation is complete.  Basic labs, UA, ekg   Harriet Pho, [place] 09/10/22 1610

## 2022-09-10 NOTE — Discharge Instructions (Addendum)
You were seen in the emergency department for your generalized weakness and decreased appetite.  Your work-up here showed no signs of severe dehydration, you are not anemic and no signs of pneumonia or infection.  Did send a COVID and flu test and this takes several hours to come back so you can follow this up on your patient portal.  You should follow-up with your primary doctor in the next few days to have your symptoms rechecked.  You can continue to drink plenty of fluids to stay well-hydrated and take Tylenol as needed for your body aches.  You should return to the emergency department if you are having significantly worsening weakness, repetitive vomiting, high fevers, severe shortness of breath, or if you have any other new or concerning symptoms.

## 2022-09-10 NOTE — ED Provider Notes (Signed)
[facility] EMERGENCY DEPARTMENT Provider Note   CSN: [id number] Arrival date & time: 09/10/22  3716     History  Chief Complaint  Patient presents with   Anorexia   Weakness   Generalized Body Aches    [name] is a 76 y.o. male.  Patient is a 76 year old male with a past medical history of CAD status post stent placement, hypertension, diabetes, COPD presenting to the emergency department with generalized weakness and body aches.  Patient states that he has been feeling weak for the last 2 days and has had decreased appetite.  He denies any nausea or vomiting.  He reports he has had low-grade fevers of 99 F at home.  He states that he has had a mild nonproductive cough.  He denies any runny nose or sore throat.  He does report watery diarrhea.  He denies any black or bloody stools.  He denies any dysuria or hematuria or associated abdominal pain.  He denies any known sick contacts.  The history is provided by the patient, the spouse and the EMS personnel.  Weakness      Home Medications Prior to Admission medications   Medication Sig Start Date End Date Taking? Authorizing Provider  amLODipine (NORVASC) 5 MG tablet Take 1 tablet (5 mg total) by mouth daily. 08/27/22   Kuneff, Renee A, DO  atorvastatin (LIPITOR) 80 MG tablet Take 1 tablet (80 mg total) by mouth daily. 08/27/22   Kuneff, Renee A, DO  buPROPion (WELLBUTRIN XL) 300 MG 24 hr tablet Take 1 tablet (300 mg total) by mouth daily. 08/27/22   Kuneff, Renee A, DO  clopidogrel (PLAVIX) 75 MG tablet Take 1 tablet (75 mg total) by mouth daily. Please make overdue appt with Dr. Johnsie Cancel before anymore refills. Thank you 1st attempt 01/14/22   Josue Hector, MD  Cyanocobalamin (VITAMIN B 12 PO) Take by mouth.    [provider]  empagliflozin ([name]) 25 MG TABS tablet Take 1 tablet (25 mg total) by mouth daily before breakfast. 08/28/22   Kuneff, Renee A, DO  famotidine (PEPCID) 20 MG tablet Take  1 tablet (20 mg total) by mouth 2 (two) times daily. 08/27/22   Kuneff, Renee A, DO  iron polysaccharides (NIFEREX) 150 MG capsule Take 1 capsule (150 mg total) by mouth daily. 04/05/16   Brunetta Genera, MD  metFORMIN (GLUCOPHAGE) 1000 MG tablet TAKE 1 TABLET (1,000 MG TOTAL) BY MOUTH DAILY WITH BREAKFAST. 08/28/22   Kuneff, Renee A, DO  metoprolol tartrate (LOPRESSOR) 50 MG tablet Take 1 tablet (50 mg total) by mouth daily. 08/27/22   Kuneff, Renee A, DO  Multiple Vitamin (MULTIVITAMIN) tablet Take 1 tablet by mouth daily.    [provider]  MYRBETRIQ 25 MG TB24 tablet  09/29/20   [provider]  nitroGLYCERIN (NITROSTAT) 0.4 MG SL tablet Place 1 tablet (0.4 mg total) under the tongue every 5 (five) minutes as needed for chest pain (3 doses max). 06/10/19   Josue Hector, MD  pantoprazole (PROTONIX) 40 MG tablet Take 1 tablet (40 mg total) by mouth 2 (two) times daily. 08/27/22   Kuneff, Renee A, DO  PARoxetine (PAXIL) 40 MG tablet Take 1 tablet (40 mg total) by mouth daily. 08/27/22   Kuneff, Renee A, DO  SYMBICORT 160-4.5 MCG/ACT inhaler Inhale 2 puffs into the lungs 2 (two) times daily. 08/27/22   Kuneff, Renee A, DO  tamsulosin (FLOMAX) 0.4 MG CAPS capsule  09/28/20   [provider]  traZODone (DESYREL) 50 MG tablet Take 3 tablets (150 mg total) by mouth at bedtime. 08/27/22   Kuneff, Renee A, DO      Allergies    Altace [ramipril], Codeine, Losartan, Remeron [mirtazapine], Tape, and Zantac [ranitidine hcl]    Review of Systems   Review of Systems  Neurological:  Positive for weakness.    Physical Exam Updated Vital Signs BP (!) 147/60   Pulse 97   Temp 99 F (37.2 C) (Oral)   Resp 15   Ht '5\' 10"'$  (1.778 m)   SpO2 97%   BMI 29.13 kg/m  Physical Exam Vitals and nursing note reviewed.  Constitutional:      General: He is not in acute distress.    Appearance: Normal appearance.  HENT:     Head: Normocephalic and atraumatic.     Nose: Nose normal.      Mouth/Throat:     Mouth: Mucous membranes are moist.     Pharynx: Oropharynx is clear.  Eyes:     Extraocular Movements: Extraocular movements intact.     Conjunctiva/sclera: Conjunctivae normal.     Pupils: Pupils are equal, round, and reactive to light.  Cardiovascular:     Rate and Rhythm: Normal rate and regular rhythm.     Pulses: Normal pulses.     Heart sounds: Normal heart sounds.  Pulmonary:     Effort: Pulmonary effort is normal.     Breath sounds: Normal breath sounds.  Abdominal:     General: Abdomen is flat.     Palpations: Abdomen is soft.     Tenderness: There is no abdominal tenderness.  Musculoskeletal:        General: Normal range of motion.     Cervical back: Normal range of motion and neck supple.     Right lower leg: No edema.     Left lower leg: No edema.  Skin:    General: Skin is warm and dry.  Neurological:     General: No focal deficit present.     Mental Status: He is alert and oriented to person, place, and time.     Sensory: No sensory deficit.     Motor: No weakness.  Psychiatric:        Mood and Affect: Mood normal.        Behavior: Behavior normal.     ED Results / Procedures / Treatments   Labs (all labs ordered are listed, but only abnormal results are displayed) Labs Reviewed  CBC - Abnormal; Notable for the following components:      Result Value   Hemoglobin 11.8 (*)    HCT 36.4 (*)    All other components within normal limits  URINALYSIS, ROUTINE W REFLEX MICROSCOPIC - Abnormal; Notable for the following components:   Glucose, UA >=500 (*)    Ketones, ur 20 (*)    All other components within normal limits  COMPREHENSIVE METABOLIC PANEL - Abnormal; Notable for the following components:   CO2 21 (*)    Glucose, Bld 168 (*)    Creatinine, Ser 1.63 (*)    GFR, Estimated 44 (*)    All other components within normal limits  RESP PANEL BY RT-PCR (FLU A&B, COVID) ARPGX2  LIPASE, BLOOD  CBG MONITORING, ED     EKG None  Radiology DG Chest 2 View  Result Date: 09/10/2022 CLINICAL DATA:  Cough and body aches. EXAM: CHEST - 2 VIEW COMPARISON:  AP chest 01/12/2011, chest two views 07/08/2013  FINDINGS: Cardiac silhouette and mediastinal contours are within normal limits. There is surgical suture again seen within the posterosuperior left lung. Surgical clips are seen within the posterior superomedial left hemithorax. Mild bibasilar interstitial thickening is unchanged from multiple prior radiographs, suggesting scarring. Mild right costophrenic angle blunting is similar to prior. No definite pleural effusion. No pneumothorax. Mild-to-moderate multilevel degenerative disc changes of the mid to upper thoracic spine. IMPRESSION: 1. No acute lung process. 2. Postsurgical changes of the left lung. 3. Mild bibasilar interstitial thickening, unchanged from prior, suggesting scarring. Electronically Signed   By: Yvonne Kendall M.D.   On: 09/10/2022 11:54    Procedures Procedures    Medications Ordered in ED Medications  lactated ringers bolus 1,000 mL (1,000 mLs Intravenous New Bag/Given 09/10/22 1132)  acetaminophen (TYLENOL) tablet 650 mg (650 mg Oral Given 09/10/22 1131)    ED Course/ Medical Decision Making/ A&P Clinical Course as of 09/10/22 1213  Tue Sep 10, 2022  1158 Labs with mild Cr elevated 1.6 from baseline 1.4, glucose within normal range. He is receiving IVF. CXR without acute disease.  [VK]  1210 Upon reassessment, the patient was asleep in bed resting comfortably.  He was easily arousable.  The patient reports improvement of his symptoms.  He is stable for discharge home.  Symptoms may be secondary to viral syndrome.  He was recommended primary care follow-up and was given strict return precautions. [VK]    Clinical Course User Index [VK] Kemper Durie, DO                           Medical Decision Making This patient presents to the ED with chief complaint(s) of generalized  weakness, body aches with pertinent past medical history of CAD status post stent placement, hypertension, diabetes, COPD which further complicates the presenting complaint. The complaint involves an extensive differential diagnosis and also carries with it a high risk of complications and morbidity.    The differential diagnosis includes ACS, arrhythmia, electrolyte abnormality, anemia, viral syndrome, sepsis, infection, HHS, DKA, patient has no focal neurologic deficits making CVA unlikely  Additional history obtained: Additional history obtained from spouse Records reviewed Primary Care Documents  ED Course and Reassessment: Patient is awake alert and well-appearing upon arrival.  He reported numbness to triage however denies any numbness to me and strength and sensation is intact on exam.  He will have labs, EKG, chest x-ray and urine performed.  He will be given Tylenol and IV fluids for symptomatic treatment and he will be reassessed.  Independent labs interpretation:  The following labs were independently interpreted: Mild creatinine elevation of 1.6 from baseline of 1.4, labs otherwise within normal range  Independent visualization of imaging: - I independently visualized the following imaging with scope of interpretation limited to determining acute life threatening conditions related to emergency care: Chest x-ray, which revealed no acute disease  Consultation: - Consulted or discussed management/test interpretation w/ external professional: N/A  Consideration for admission or further workup: Patient has no emergent conditions requiring admission at this time and is stable for discharge with primary care follow-up Social Determinants of health: N/A    Amount and/or Complexity of Data Reviewed Labs: ordered. Radiology: ordered.  Risk OTC drugs.          Final Clinical Impression(s) / ED Diagnoses Final diagnoses:  Generalized weakness  Viral illness    Rx / DC  Orders ED Discharge Orders     None  Leanord Asal K, DO 09/10/22 1213

## 2022-09-10 NOTE — ED Triage Notes (Signed)
Wife stated, he has loss of appetite with weakness , chills and feeling bad and arm and shoulder numbness for 2 weeks. He also has a dry cough. He was senn 3weeks ago by his Dr.

## 2022-09-15 DIAGNOSIS — J189 Pneumonia, unspecified organism: Secondary | ICD-10-CM | POA: Diagnosis not present

## 2022-09-15 DIAGNOSIS — J168 Pneumonia due to other specified infectious organisms: Secondary | ICD-10-CM | POA: Diagnosis not present

## 2022-09-15 DIAGNOSIS — Z885 Allergy status to narcotic agent status: Secondary | ICD-10-CM | POA: Diagnosis not present

## 2022-09-15 DIAGNOSIS — U071 COVID-19: Secondary | ICD-10-CM | POA: Diagnosis not present

## 2022-09-15 DIAGNOSIS — R410 Disorientation, unspecified: Secondary | ICD-10-CM | POA: Diagnosis not present

## 2022-09-15 DIAGNOSIS — E1165 Type 2 diabetes mellitus with hyperglycemia: Secondary | ICD-10-CM | POA: Diagnosis not present

## 2022-09-15 DIAGNOSIS — J1282 Pneumonia due to coronavirus disease 2019: Secondary | ICD-10-CM | POA: Diagnosis not present

## 2022-09-15 DIAGNOSIS — J984 Other disorders of lung: Secondary | ICD-10-CM | POA: Diagnosis not present

## 2022-09-15 DIAGNOSIS — Z888 Allergy status to other drugs, medicaments and biological substances status: Secondary | ICD-10-CM | POA: Diagnosis not present

## 2022-09-15 DIAGNOSIS — J449 Chronic obstructive pulmonary disease, unspecified: Secondary | ICD-10-CM | POA: Diagnosis not present

## 2022-09-15 DIAGNOSIS — R509 Fever, unspecified: Secondary | ICD-10-CM | POA: Diagnosis not present

## 2022-09-15 DIAGNOSIS — R4182 Altered mental status, unspecified: Secondary | ICD-10-CM | POA: Diagnosis not present

## 2022-09-15 DIAGNOSIS — G9340 Encephalopathy, unspecified: Secondary | ICD-10-CM | POA: Diagnosis not present

## 2022-09-15 DIAGNOSIS — J9601 Acute respiratory failure with hypoxia: Secondary | ICD-10-CM | POA: Diagnosis not present

## 2022-09-15 DIAGNOSIS — R7989 Other specified abnormal findings of blood chemistry: Secondary | ICD-10-CM | POA: Diagnosis not present

## 2022-09-15 DIAGNOSIS — J159 Unspecified bacterial pneumonia: Secondary | ICD-10-CM | POA: Diagnosis not present

## 2022-09-15 DIAGNOSIS — R451 Restlessness and agitation: Secondary | ICD-10-CM | POA: Diagnosis not present

## 2022-09-15 DIAGNOSIS — R059 Cough, unspecified: Secondary | ICD-10-CM | POA: Diagnosis not present

## 2022-09-15 DIAGNOSIS — R918 Other nonspecific abnormal finding of lung field: Secondary | ICD-10-CM | POA: Diagnosis not present

## 2022-09-15 HISTORY — DX: Hypomagnesemia: E83.42

## 2022-09-15 HISTORY — DX: Pneumonia, unspecified organism: J18.9

## 2022-09-16 ENCOUNTER — Telehealth: Payer: Self-pay

## 2022-09-16 DIAGNOSIS — R7989 Other specified abnormal findings of blood chemistry: Secondary | ICD-10-CM | POA: Diagnosis not present

## 2022-09-16 DIAGNOSIS — R509 Fever, unspecified: Secondary | ICD-10-CM | POA: Diagnosis not present

## 2022-09-16 DIAGNOSIS — R059 Cough, unspecified: Secondary | ICD-10-CM | POA: Diagnosis not present

## 2022-09-16 DIAGNOSIS — U071 COVID-19: Secondary | ICD-10-CM | POA: Diagnosis not present

## 2022-09-16 DIAGNOSIS — R4182 Altered mental status, unspecified: Secondary | ICD-10-CM | POA: Diagnosis not present

## 2022-09-16 DIAGNOSIS — E1165 Type 2 diabetes mellitus with hyperglycemia: Secondary | ICD-10-CM | POA: Diagnosis not present

## 2022-09-16 DIAGNOSIS — J159 Unspecified bacterial pneumonia: Secondary | ICD-10-CM | POA: Diagnosis not present

## 2022-09-16 DIAGNOSIS — J1282 Pneumonia due to coronavirus disease 2019: Secondary | ICD-10-CM | POA: Diagnosis not present

## 2022-09-16 DIAGNOSIS — J984 Other disorders of lung: Secondary | ICD-10-CM | POA: Diagnosis not present

## 2022-09-16 NOTE — Telephone Encounter (Signed)
Pt currently in hospital.

## 2022-09-16 NOTE — Telephone Encounter (Signed)
[place] Night - Client TELEPHONE ADVICE RECORD [facility] Patient Name: [name] Gender: Male DOB: 1946-07-30 Age: 76 Y 11 M 3 D Return Phone Number: [phone] (Primary), [phone] (Secondary) Address: [name]  [address] Client [facility] Night - Client Client Site [place] Night Provider Raoul Pitch, [place] Type Call Who Is Calling Patient / Member / Family / Caregiver Call Type Triage / Clinical Caller Name Ladon Vandenberghe Relationship To Patient Spouse Return Phone Number [phone] (Primary) Chief Complaint CONFUSION - new onset Reason for Call Symptomatic / Request for [place] states her husband has started taking Jardience and has become disoriented and talking out of his head. He is experiencing loss of appetite and weakness. The wife states she thinks it is from his medication. Translation No Nurse Assessment Nurse: Glean Salvo, RN, Magda Paganini Date/Time Eilene Ghazi Time): 09/13/2022 8:40:11 PM Confirm and document reason for call. If symptomatic, describe symptoms. ---Caller states that her husband has been experiencing periods of significant confusion and decreased coordination. She states the first time was Tues and he was cleared by ER and sent home with dx viral illness. Today it was bad again and he is disoriented to place. He is not speaking normally as well. Does the patient have any new or worsening symptoms? ---Yes Will a triage be completed? ---Yes Related visit to physician within the last 2 weeks? ---Yes Does the PT have any chronic conditions? (i.e. diabetes, asthma, this includes High risk factors for pregnancy, etc.) ---Yes List chronic conditions. ---DM2 and COPD Is this a behavioral health or substance abuse call? ---No Guidelines Guideline Title Affirmed Question Affirmed Notes Nurse Date/Time  (Eastern Time) Confusion - Delirium [1] Difficult to awaken or acting confused (e.g., disoriented, slurred Glean Salvo, Cruzita Lederer 09/13/2022 8:43:06 PM PLEASE NOTE: All timestamps contained within this report are represented as [place] Standard Time. CONFIDENTIALTY NOTICE: This fax transmission is intended only for the addressee. It contains information that is legally privileged, confidential or otherwise protected from use or disclosure. If you are not the intended recipient, you are strictly prohibited from reviewing, disclosing, copying using or disseminating any of this information or taking any action in reliance on or regarding this information. If you have received this fax in error, please notify us immediately by telephone so that we can arrange for its return to [place]. Phone: [phone], Toll-Free: [phone], Fax: [phone] Page: 2 of 2 Call Id: [id number] Guidelines Guideline Title Affirmed Question Affirmed Notes Nurse Date/Time Eilene Ghazi Time) speech) AND [2] present now AND [3] diabetes mellitus Disp. Time Eilene Ghazi Time) Disposition Final User 09/13/2022 8:39:01 PM Send To RN Personal Sherre Lain 09/13/2022 8:43:22 PM Call EMS 911 Now Yes Rebecca Eaton 09/13/2022 8:44:05 PM 911 Outcome Documentation Glean Salvo, RN, Magda Paganini Reason: no call back Final Disposition 09/13/2022 8:43:22 PM Call EMS 911 Now Yes Glean Salvo, RN, Christa See Disagree/Comply Comply Caller Understands Yes PreDisposition Call Doctor Care Advice Given Per Guideline CALL EMS 911 NOW: * Immediate medical attention is needed. You need to hang up and call 911 (or an ambulance). CARE ADVICE given per Confusion-Delirium (Adult) guideline. Comments User: Beulah Gandy, RN Date/Time Eilene Ghazi Time): 09/13/2022 8:43:38 PM Call on chart closed in error

## 2022-09-17 DIAGNOSIS — J1282 Pneumonia due to coronavirus disease 2019: Secondary | ICD-10-CM | POA: Diagnosis not present

## 2022-09-17 DIAGNOSIS — U071 COVID-19: Secondary | ICD-10-CM | POA: Diagnosis not present

## 2022-09-17 DIAGNOSIS — R4182 Altered mental status, unspecified: Secondary | ICD-10-CM | POA: Diagnosis not present

## 2022-09-17 DIAGNOSIS — J9601 Acute respiratory failure with hypoxia: Secondary | ICD-10-CM | POA: Diagnosis not present

## 2022-09-17 DIAGNOSIS — E1165 Type 2 diabetes mellitus with hyperglycemia: Secondary | ICD-10-CM | POA: Diagnosis not present

## 2022-09-17 DIAGNOSIS — J159 Unspecified bacterial pneumonia: Secondary | ICD-10-CM | POA: Diagnosis not present

## 2022-09-18 DIAGNOSIS — R4182 Altered mental status, unspecified: Secondary | ICD-10-CM | POA: Diagnosis not present

## 2022-09-18 DIAGNOSIS — E1165 Type 2 diabetes mellitus with hyperglycemia: Secondary | ICD-10-CM | POA: Diagnosis not present

## 2022-09-18 DIAGNOSIS — J159 Unspecified bacterial pneumonia: Secondary | ICD-10-CM | POA: Diagnosis not present

## 2022-09-18 DIAGNOSIS — U071 COVID-19: Secondary | ICD-10-CM | POA: Diagnosis not present

## 2022-09-18 DIAGNOSIS — J9601 Acute respiratory failure with hypoxia: Secondary | ICD-10-CM | POA: Diagnosis not present

## 2022-09-18 DIAGNOSIS — J1282 Pneumonia due to coronavirus disease 2019: Secondary | ICD-10-CM | POA: Diagnosis not present

## 2022-09-19 DIAGNOSIS — R4182 Altered mental status, unspecified: Secondary | ICD-10-CM | POA: Diagnosis not present

## 2022-09-19 DIAGNOSIS — J1282 Pneumonia due to coronavirus disease 2019: Secondary | ICD-10-CM | POA: Diagnosis not present

## 2022-09-19 DIAGNOSIS — J9601 Acute respiratory failure with hypoxia: Secondary | ICD-10-CM | POA: Diagnosis not present

## 2022-09-19 DIAGNOSIS — E1165 Type 2 diabetes mellitus with hyperglycemia: Secondary | ICD-10-CM | POA: Diagnosis not present

## 2022-09-19 DIAGNOSIS — J159 Unspecified bacterial pneumonia: Secondary | ICD-10-CM | POA: Diagnosis not present

## 2022-09-19 DIAGNOSIS — U071 COVID-19: Secondary | ICD-10-CM | POA: Diagnosis not present

## 2022-09-23 ENCOUNTER — Telehealth: Payer: Self-pay

## 2022-09-23 NOTE — Patient Outreach (Signed)
  Care Coordination TOC Note Transition Care Management Unsuccessful Follow-up Telephone Call  Date of discharge and from where:  09/19/22-[facility]   Attempts:  1st Attempt  Reason for unsuccessful TCM follow-up call:  No answer/busy   Enzo Montgomery, RN,BSN,CCM [place] [facility] Telephonic Care Management Coordinator Direct Phone: [phone] Toll Free: [phone] Fax: [phone]

## 2022-09-24 ENCOUNTER — Telehealth: Payer: Self-pay

## 2022-09-24 ENCOUNTER — Other Ambulatory Visit: Payer: Self-pay

## 2022-09-24 NOTE — Patient Outreach (Signed)
  Care Coordination TOC Note Transition Care Management Unsuccessful Follow-up Telephone Call  Date of discharge and from where:  09/19/22-[facility]  Attempts:  2nd Attempt  Reason for unsuccessful TCM follow-up call:  No answer/busy   Enzo Montgomery, RN,BSN,CCM [place] [facility] Telephonic Care Management Coordinator Direct Phone: [phone] Toll Free: [phone] Fax: [phone]

## 2022-09-24 NOTE — Patient Outreach (Signed)
  Care Coordination TOC Note  Transition Care Management Unsuccessful Follow-up Telephone Call  Date of discharge and from where:  09/19/22-[facility]  Attempts:  3rd Attempt  Reason for unsuccessful TCM follow-up call:  Unable to reach patient     Enzo Montgomery, RN,BSN,CCM [place] [facility] Telephonic Care Management Coordinator Direct Phone: [phone] Toll Free: [phone] Fax: [phone]

## 2022-09-24 NOTE — Telephone Encounter (Signed)
error 

## 2022-10-02 ENCOUNTER — Encounter: Payer: Self-pay | Admitting: Family Medicine

## 2022-10-02 ENCOUNTER — Ambulatory Visit (INDEPENDENT_AMBULATORY_CARE_PROVIDER_SITE_OTHER): Payer: Medicare HMO

## 2022-10-02 ENCOUNTER — Ambulatory Visit (INDEPENDENT_AMBULATORY_CARE_PROVIDER_SITE_OTHER): Payer: Medicare HMO | Admitting: Family Medicine

## 2022-10-02 VITALS — BP 121/76 | HR 64 | Temp 98.3°F | Wt 192.8 lb

## 2022-10-02 DIAGNOSIS — J1282 Pneumonia due to coronavirus disease 2019: Secondary | ICD-10-CM

## 2022-10-02 DIAGNOSIS — E1169 Type 2 diabetes mellitus with other specified complication: Secondary | ICD-10-CM

## 2022-10-02 DIAGNOSIS — Z9289 Personal history of other medical treatment: Secondary | ICD-10-CM

## 2022-10-02 DIAGNOSIS — R197 Diarrhea, unspecified: Secondary | ICD-10-CM

## 2022-10-02 DIAGNOSIS — E785 Hyperlipidemia, unspecified: Secondary | ICD-10-CM

## 2022-10-02 DIAGNOSIS — U071 COVID-19: Secondary | ICD-10-CM

## 2022-10-02 DIAGNOSIS — R351 Nocturia: Secondary | ICD-10-CM | POA: Diagnosis not present

## 2022-10-02 DIAGNOSIS — J189 Pneumonia, unspecified organism: Secondary | ICD-10-CM | POA: Diagnosis not present

## 2022-10-02 DIAGNOSIS — K921 Melena: Secondary | ICD-10-CM

## 2022-10-02 DIAGNOSIS — F5101 Primary insomnia: Secondary | ICD-10-CM

## 2022-10-02 DIAGNOSIS — R140 Abdominal distension (gaseous): Secondary | ICD-10-CM | POA: Diagnosis not present

## 2022-10-02 LAB — CBC WITH DIFFERENTIAL/PLATELET
Basophils Absolute: 0.1 10*3/uL (ref 0.0–0.1)
Basophils Relative: 0.5 % (ref 0.0–3.0)
Eosinophils Absolute: 0.2 10*3/uL (ref 0.0–0.7)
Eosinophils Relative: 1.7 % (ref 0.0–5.0)
HCT: 34.9 % — ABNORMAL LOW (ref 39.0–52.0)
Hemoglobin: 11.4 g/dL — ABNORMAL LOW (ref 13.0–17.0)
Lymphocytes Relative: 13.8 % (ref 12.0–46.0)
Lymphs Abs: 1.5 10*3/uL (ref 0.7–4.0)
MCHC: 32.6 g/dL (ref 30.0–36.0)
MCV: 83.8 fl (ref 78.0–100.0)
Monocytes Absolute: 0.8 10*3/uL (ref 0.1–1.0)
Monocytes Relative: 6.9 % (ref 3.0–12.0)
Neutro Abs: 8.4 10*3/uL — ABNORMAL HIGH (ref 1.4–7.7)
Neutrophils Relative %: 77.1 % — ABNORMAL HIGH (ref 43.0–77.0)
Platelets: 287 10*3/uL (ref 150.0–400.0)
RBC: 4.17 Mil/uL — ABNORMAL LOW (ref 4.22–5.81)
RDW: 16.2 % — ABNORMAL HIGH (ref 11.5–15.5)
WBC: 10.9 10*3/uL — ABNORMAL HIGH (ref 4.0–10.5)

## 2022-10-02 LAB — BASIC METABOLIC PANEL
BUN: 26 mg/dL — ABNORMAL HIGH (ref 6–23)
CO2: 29 mEq/L (ref 19–32)
Calcium: 9.2 mg/dL (ref 8.4–10.5)
Chloride: 100 mEq/L (ref 96–112)
Creatinine, Ser: 1.11 mg/dL (ref 0.40–1.50)
GFR: 65.2 mL/min (ref >60.00)
Glucose, Bld: 271 mg/dL — ABNORMAL HIGH (ref 70–99)
Potassium: 4.9 mEq/L (ref 3.5–5.1)
Sodium: 135 mEq/L (ref 135–145)

## 2022-10-02 LAB — MAGNESIUM: Magnesium: 1.6 mg/dL (ref 1.5–2.5)

## 2022-10-02 MED ORDER — BLOOD GLUCOSE METER KIT: PACK | 0 refills | Status: DC

## 2022-10-02 MED ORDER — DAPAGLIFLOZIN PROPANEDIOL 10 MG PO TABS: 10.0000 mg | ORAL_TABLET | Freq: Every day | ORAL | 5 refills | Status: DC

## 2022-10-02 MED ORDER — MYRBETRIQ 25 MG PO TB24: 25.0000 mg | ORAL_TABLET | Freq: Every day | ORAL | 11 refills | Status: DC

## 2022-10-02 NOTE — Patient Instructions (Addendum)
Return if symptoms worsen or fail to improve. Stop metformin Start Farxiga 10 mg a day (our samples are only '5mg'$  -so take 2 of these a day.) once you pick up prescribed go to 1 tab daily (10 mg) Start myrbetriq daily  Chest xray  [facility] [address], [place], [place] [address]       Doristine Devoid to see you today.  I have refilled the medication(s) we provide.   If labs were collected, we will inform you of lab results once received either by echart message or telephone call.   - echart message- for normal results that have been seen by the patient already.   - telephone call: abnormal results or if patient has not viewed results in their echart.

## 2022-10-02 NOTE — Progress Notes (Unsigned)
[name] , 1946/05/05, 76 y.o., male MRN: [id number] Patient Care Team    Relationship Specialty Notifications Start End  [name], DO PCP - General Family Medicine  12/25/15   Josue Hector, MD PCP - Cardiology Cardiology Admissions 08/20/18   Brunetta Genera, MD Consulting Physician Hematology  09/03/16   Josue Hector, MD Consulting Physician Cardiology  09/03/16   Wilford Corner, MD Consulting Physician Gastroenterology  09/03/16   Melina Schools, OD  Optometry  09/03/16   Kathie Rhodes, MD (Inactive) Consulting Physician Urology  09/10/16   Hollar, Katharine Look, MD Referring Physician Dermatology  06/29/19     Chief Complaint  Patient presents with   Pneumonia    Hospital follow up     Subjective: Pt presents for an OV with his wife today for hospitalization follow-up.  He was recently admitted to the [facility] system for COVID-pneumonia.  I have reviewed all imaging and lab results from hospitalization. Today patient states he is feeling better but still mildly weak.  He is tolerating p.o. his wife and he thinks that maybe he is still weak because he is having diarrhea.  They state this has been going on for a long time, but seems to have worsened over the last few months.  He reports he has had melena frequently over the last couple months. Colonoscopy was due last year-they report he did not have a great experience with that provider and are requesting new referral. They also complained that he has lack of sleep.  He does not get well-rounded sleep and he is up multiple times through the night with nocturia, he has been unable to get back to sleep on many occasions.  He does follow with a urologist.  He has been prescribed Myrbetriq at one point, they do not recall why he stopped.   Cxr (09/15/2022) EXAM: XR CHEST AP PORTABLE  INDICATION: Cough  COMPARISON: none  FINDINGS:  There is bilateral lower lobe infiltrate versus atelectasis.  There is some  calcification along the right hemidiaphragm.  There may be a small right pleural effusion.  No pneumothorax.  No cardiomegaly or pulmonary vascular congestion.  CT angio pulm (09/16/2022)  IMPRESSION:  1. No pulmonary embolism.  2.  Extensive alveolar infiltrates and consolidation of the lung bases suspicious for pneumonia.  3.  Alveolar nodular densities measuring up to 2.0 cm within the right upper lobe likely infectious in nature with atypical infection or septic emboli not excluded. Follow-up to resolution is recommended to rule out neoplasm.  4.  Shotty, probable reactive lymph nodes of the mediastinum and hila.  5.  Small hiatal hernia.  CLINICAL DATA:  Cough and body aches.   09/10/2022: CHEST - 2 VIEW COMPARISON:  AP chest 01/12/2011, chest two views 07/08/2013   FINDINGS: Cardiac silhouette and mediastinal contours are within normal limits. There is surgical suture again seen within the posterosuperior left lung. Surgical clips are seen within the posterior superomedial left hemithorax. Mild bibasilar interstitial thickening is unchanged from multiple prior radiographs, suggesting scarring. Mild right costophrenic angle blunting is similar to prior. No definite pleural effusion. No pneumothorax. Mild-to-moderate multilevel degenerative disc changes of the mid to upper thoracic spine. IMPRESSION: 1. No acute lung process. 2. Postsurgical changes of the left lung. 3. Mild bibasilar interstitial thickening, unchanged from prior, suggesting scarring.    08/27/2022    3:23 PM 11/28/2021   11:02 AM 09/07/2021   11:06 AM 05/09/2021  10:25 AM 03/08/2021   11:04 AM  Depression screen PHQ 2/9  Decreased Interest 2 0 3 0 3  Down, Depressed, Hopeless _0 PHQ - 2 Score _1 Altered sleeping _2 Tired, decreased energy 3  3 0 0  Change in appetite 2  2 0 0  Feeling bad or failure about yourself  1  0 0 1  Trouble concentrating _3 Moving slowly or  fidgety/restless 1  0 3 1  Suicidal thoughts 0  0 0 0  PHQ-9 Score _4 Difficult doing work/chores    Somewhat difficult Somewhat difficult    Allergies  Allergen Reactions   Latex Hives   Altace [Ramipril] Cough   Codeine     Makes sick   Losartan Cough    cough   Remeron [Mirtazapine] Other (See Comments)    Cognitive changes   Tape     Blisters    Zantac [Ranitidine Hcl] Dermatitis   Social History   Social History Narrative   Lives at home with wife. Married to [name].   Retired Production designer, theatre/television/film truck.    Drinks caffeine (3 cups coffee, 2 sodas per day),  Takes a daily vitamin, wears a seatbelt   Wears a bicycle helmet.    Has dentures (partial on top)   Smoke detector in the home. No firearms in the home.    Feels safe in relationships.     Right-handed.   Past Medical History:  Diagnosis Date   Achilles tendinitis of left lower extremity 07/20/2018   Adenomatous colon polyp 2011   Barrett esophagus 12/2013   EGD   CAD (coronary artery disease)    ETT/Lexiscan-Myoview (11/14):  Normal; no ischemia, EF 67%   CKD (chronic kidney disease), stage III (HCC)    Colon polyp 09/2010   adenoma   COPD (chronic obstructive pulmonary disease) ([place]) 2009   spirometry with "moderate COPD"   Depression    Diabetes mellitus without complication (HCC)    Dyspnea    GERD (gastroesophageal reflux disease)    GI bleed    after polypectomy/admitted   Hair loss 08/07/2018   HTN (hypertension)    Hypercholesterolemia    Memory loss    Myocardial infarction ([place])    Obesity    Sleep apnea    Spontaneous pneumothorax    Synovial cyst of right popliteal space 07/20/2018   Vertigo    Past Surgical History:  Procedure Laterality Date   APPENDECTOMY     CORONARY ANGIOPLASTY WITH STENT PLACEMENT  2007   LAD   PLEURAL SCARIFICATION  1987   TONSILLECTOMY     VARICOCELECTOMY     Family History  Problem Relation Age of Onset   Heart disease Mother    CVA Mother    Dementia  Mother    Coronary artery disease Father 42   Heart disease Father    Stroke Father    Dementia Sister    Cancer - Other Brother    Hypertension Other    Hyperlipidemia Other    Colon polyps Sister    Cancer Sister    Allergies as of 10/02/2022       Reactions   Altace [ramipril] Cough   Codeine    Makes sick   Losartan Cough   cough   Remeron [mirtazapine] Other (See Comments)   Cognitive changes   Tape  Blisters   Zantac [ranitidine Hcl] Dermatitis        Medication List        Accurate as of October 02, 2022 11:59 PM. If you have any questions, ask your nurse or doctor.          STOP taking these medications    empagliflozin 25 MG Tabs tablet Commonly known as: [name] Stopped by: Howard Pouch, DO   metFORMIN 1000 MG tablet Commonly known as: GLUCOPHAGE Stopped by: Howard Pouch, DO       TAKE these medications    amLODipine 5 MG tablet Commonly known as: NORVASC Take 1 tablet (5 mg total) by mouth daily.   atorvastatin 80 MG tablet Commonly known as: LIPITOR Take 1 tablet (80 mg total) by mouth daily.   blood glucose meter kit and supplies Dispense based on patient and insurance preference. Use up to three times daily as directed. (FOR ICD-10 E10.9, E11.9). Started by: Howard Pouch, DO   buPROPion 300 MG 24 hr tablet Commonly known as: WELLBUTRIN XL Take 1 tablet (300 mg total) by mouth daily.   clopidogrel 75 MG tablet Commonly known as: PLAVIX Take 1 tablet (75 mg total) by mouth daily. Please make overdue appt with Dr. Johnsie Cancel before anymore refills. Thank you 1st attempt   dapagliflozin propanediol 10 MG Tabs tablet Commonly known as: Farxiga Take 1 tablet (10 mg total) by mouth daily before breakfast. Started by: Howard Pouch, DO   famotidine 20 MG tablet Commonly known as: Pepcid Take 1 tablet (20 mg total) by mouth 2 (two) times daily.   iron polysaccharides 150 MG capsule Commonly known as: NIFEREX Take 1 capsule (150 mg  total) by mouth daily.   metoprolol tartrate 50 MG tablet Commonly known as: LOPRESSOR Take 1 tablet (50 mg total) by mouth daily.   multivitamin tablet Take 1 tablet by mouth daily.   Myrbetriq 25 MG Tb24 tablet Generic drug: mirabegron ER Take 1 tablet (25 mg total) by mouth daily. What changed:  how much to take how to take this when to take this Changed by: Howard Pouch, DO   nitroGLYCERIN 0.4 MG SL tablet Commonly known as: NITROSTAT Place 1 tablet (0.4 mg total) under the tongue every 5 (five) minutes as needed for chest pain (3 doses max).   OLANZapine 2.5 MG tablet Commonly known as: ZYPREXA Take 1 tablet (2.5 mg total) by mouth at bedtime. Started by: Howard Pouch, DO   pantoprazole 40 MG tablet Commonly known as: PROTONIX Take 1 tablet (40 mg total) by mouth 2 (two) times daily.   PARoxetine 40 MG tablet Commonly known as: PAXIL Take 1 tablet (40 mg total) by mouth daily.   Symbicort 160-4.5 MCG/ACT inhaler Generic drug: budesonide-formoterol Inhale 2 puffs into the lungs 2 (two) times daily.   tamsulosin 0.4 MG Caps capsule Commonly known as: FLOMAX   traZODone 50 MG tablet Commonly known as: DESYREL Take 3 tablets (150 mg total) by mouth at bedtime.   VITAMIN B 12 PO Take by mouth.        All past medical history, surgical history, allergies, family history, immunizations andmedications were updated in the EMR today and reviewed under the history and medication portions of their EMR.     ROS Negative, with the exception of above mentioned in HPI   Objective:  BP 121/76   Pulse 64   Temp 98.3 F (36.8 C)   Wt 192 lb 12.8 oz (87.5 kg)   SpO2 97%  BMI 27.66 kg/m  Body mass index is 27.66 kg/m. Physical Exam Vitals and nursing note reviewed.  Constitutional:      General: He is not in acute distress.    Appearance: Normal appearance. He is not ill-appearing, toxic-appearing or diaphoretic.  HENT:     Head: Normocephalic and  atraumatic.     Mouth/Throat:     Mouth: Mucous membranes are dry.  Eyes:     General: No scleral icterus.       Right eye: No discharge.        Left eye: No discharge.     Extraocular Movements: Extraocular movements intact.     Pupils: Pupils are equal, round, and reactive to light.  Cardiovascular:     Rate and Rhythm: Normal rate and regular rhythm.  Pulmonary:     Effort: Pulmonary effort is normal. No respiratory distress.     Breath sounds: Normal breath sounds. No wheezing, rhonchi or rales.     Comments: Mild decrease in breath sounds right lung fields Musculoskeletal:     Cervical back: Neck supple.     Right lower leg: No edema.     Left lower leg: No edema.  Lymphadenopathy:     Cervical: No cervical adenopathy.  Skin:    General: Skin is warm and dry.     Coloration: Skin is pale. Skin is not jaundiced.     Findings: No rash.  Neurological:     Mental Status: He is alert and oriented to person, place, and time. Mental status is at baseline.  Psychiatric:        Mood and Affect: Mood normal.        Behavior: Behavior normal.        Thought Content: Thought content normal.        Judgment: Judgment normal.     No results found. No results found. No results found for this or any previous visit (from the past 24 hour(s)).  Assessment/Plan: Todd SWAILS is a 76 y.o. male present for OV for History of recent hospitalization Pneumonia due to COVID-19 virus Seems to be improving.  He is using his inhalers appropriately.  Will obtain x-ray to ensure resolution. - DG Chest 2 View; Future - CBC w/Diff - Basic Metabolic Panel (BMET)  Hypomagnesemia - Magnesium  Type 2 diabetes mellitus with hyperlipidemia ([place]) His wife seems hesitant concerning the [name] medication.  They were counseled this is especially good in patients with heart disease and diabetes.  Reservations are mostly around associating his illness, which was COVID-pneumonia, with the start of  [name]. DC metformin-diarrhea Start Farxiga 10 mg daily.  Samples were provided to him today along with discount card.  Diarrhea, unspecified type/melena/abdominal bloating Stop metformin. These had be ruled out as cause of diarrhea in the past, but will be certain by dc med today and tx DM with other agent - Ambulatory referral to Gastroenterology to digestive health specialist.  He is overdue for colonoscopy 5-year repeat which was due last year.  Place new referral for him per his request.  Nocturia Encouraged him to follow-up with his urologist.  He is on Flomax prescribed by urology. He had been on Myrbetriq in the past.  Encouraged him to restart Myrbetriq 25 mg daily and call that in for them today.  He understands he needs to be on medicine at least 4 weeks to find full benefit of that particular dose and then if needed we would taper up to the 50 mg  dose.  Primary insomnia Lower trazodone dose to 50 mg nightly. Start olanzapine 2.5 mg nightly.-This medicine was tolerated during hospitalization. Follow-up in 1 month and can adjust meds if needed.   Reviewed expectations re: course of current medical issues. Discussed self-management of symptoms. Outlined signs and symptoms indicating need for more acute intervention. Patient verbalized understanding and all questions were answered. Patient received an After-Visit Summary.    Orders Placed This Encounter  Procedures   DG Chest 2 View   CBC w/Diff   Basic Metabolic Panel (BMET)   Magnesium   Ambulatory referral to Gastroenterology   Meds ordered this encounter  Medications   dapagliflozin propanediol (FARXIGA) 10 MG TABS tablet    Sig: Take 1 tablet (10 mg total) by mouth daily before breakfast.    Dispense:  30 tablet    Refill:  5   MYRBETRIQ 25 MG TB24 tablet    Sig: Take 1 tablet (25 mg total) by mouth daily.    Dispense:  30 tablet    Refill:  11   blood glucose meter kit and supplies    Sig: Dispense based  on patient and insurance preference. Use up to three times daily as directed. (FOR ICD-10 E10.9, E11.9).    Dispense:  1 each    Refill:  0    Please dispense based on formulary    Order Specific Question:   Number of strips    Answer:   100    Order Specific Question:   Number of lancets    Answer:   100   OLANZapine (ZYPREXA) 2.5 MG tablet    Sig: Take 1 tablet (2.5 mg total) by mouth at bedtime.    Dispense:  30 tablet    Refill:  2   Referral Orders         Ambulatory referral to Gastroenterology       Note is dictated utilizing voice recognition software. Although note has been proof read prior to signing, occasional typographical errors still can be missed. If any questions arise, please do not hesitate to call for verification.   electronically signed by:  Howard Pouch, DO  [place]

## 2022-10-03 ENCOUNTER — Telehealth: Payer: Self-pay | Admitting: Family Medicine

## 2022-10-03 DIAGNOSIS — F5101 Primary insomnia: Secondary | ICD-10-CM | POA: Insufficient documentation

## 2022-10-03 MED ORDER — OLANZAPINE 2.5 MG PO TABS: 2.5000 mg | ORAL_TABLET | Freq: Every day | ORAL | 2 refills | Status: DC

## 2022-10-03 NOTE — Telephone Encounter (Signed)
Spoke with patient regarding results/recommendations.  

## 2022-10-03 NOTE — Telephone Encounter (Signed)
Please call patient The function has returned to baseline which is great news. He definitely needs to make sure he is treating his diabetes patient, we switched his management over to [place] yesterday.  His glucose was 271 at the time of collection yesterday, that is very high.  Hemoglobin/red blood cell count is stable from prior collection and does not show any worsening anemia. Still has a mildly elevated white blood cell count, but he is stable from his hospital discharge date.   Lastly, he and his wife expressed desiring a medication to help him sleep.  I noticed he was given olanzapine during his hospital stay and this was a medication we were considering for him in the past for his insomnia.  Since we know he tolerated this in the hospital, I would suggest we start the olanzapine 2.5 mg tab nightly about 1 hour before bed.  Until we figure out the right dose for him, we need him to cut back on the trazodone to 1 tab (50 mg) nightly.  Ideally we will taper off the trazodone and taper up on the Zyprexa in hopes that it would work better for him.     Chest x-ray is not read yet.  We will call him with those results once available.   Follow-up in 1 month on depression/insomnia so that we can continue to taper on medications.

## 2022-10-11 ENCOUNTER — Other Ambulatory Visit: Payer: Self-pay

## 2022-10-31 ENCOUNTER — Other Ambulatory Visit: Payer: Self-pay | Admitting: Family Medicine

## 2022-10-31 ENCOUNTER — Other Ambulatory Visit: Payer: Self-pay

## 2022-10-31 MED ORDER — ACCU-CHEK GUIDE VI STRP: ORAL_STRIP | 12 refills | Status: DC

## 2022-11-01 ENCOUNTER — Ambulatory Visit (INDEPENDENT_AMBULATORY_CARE_PROVIDER_SITE_OTHER): Payer: Medicare HMO | Admitting: Family Medicine

## 2022-11-01 ENCOUNTER — Encounter: Payer: Self-pay | Admitting: Family Medicine

## 2022-11-01 VITALS — BP 136/63 | HR 68 | Temp 98.0°F | Ht 70.0 in | Wt 197.0 lb

## 2022-11-01 DIAGNOSIS — D500 Iron deficiency anemia secondary to blood loss (chronic): Secondary | ICD-10-CM | POA: Diagnosis not present

## 2022-11-01 DIAGNOSIS — I10 Essential (primary) hypertension: Secondary | ICD-10-CM

## 2022-11-01 DIAGNOSIS — E785 Hyperlipidemia, unspecified: Secondary | ICD-10-CM | POA: Diagnosis not present

## 2022-11-01 DIAGNOSIS — I679 Cerebrovascular disease, unspecified: Secondary | ICD-10-CM

## 2022-11-01 DIAGNOSIS — E1169 Type 2 diabetes mellitus with other specified complication: Secondary | ICD-10-CM | POA: Diagnosis not present

## 2022-11-01 DIAGNOSIS — F5101 Primary insomnia: Secondary | ICD-10-CM

## 2022-11-01 DIAGNOSIS — K2100 Gastro-esophageal reflux disease with esophagitis, without bleeding: Secondary | ICD-10-CM | POA: Diagnosis not present

## 2022-11-01 DIAGNOSIS — I2510 Atherosclerotic heart disease of native coronary artery without angina pectoris: Secondary | ICD-10-CM | POA: Diagnosis not present

## 2022-11-01 DIAGNOSIS — N401 Enlarged prostate with lower urinary tract symptoms: Secondary | ICD-10-CM

## 2022-11-01 DIAGNOSIS — D6869 Other thrombophilia: Secondary | ICD-10-CM

## 2022-11-01 DIAGNOSIS — I6782 Cerebral ischemia: Secondary | ICD-10-CM

## 2022-11-01 DIAGNOSIS — Z79899 Other long term (current) drug therapy: Secondary | ICD-10-CM

## 2022-11-01 DIAGNOSIS — F330 Major depressive disorder, recurrent, mild: Secondary | ICD-10-CM

## 2022-11-01 DIAGNOSIS — F3341 Major depressive disorder, recurrent, in partial remission: Secondary | ICD-10-CM

## 2022-11-01 DIAGNOSIS — Z5181 Encounter for therapeutic drug level monitoring: Secondary | ICD-10-CM | POA: Diagnosis not present

## 2022-11-01 DIAGNOSIS — I252 Old myocardial infarction: Secondary | ICD-10-CM

## 2022-11-01 DIAGNOSIS — G319 Degenerative disease of nervous system, unspecified: Secondary | ICD-10-CM

## 2022-11-01 DIAGNOSIS — N138 Other obstructive and reflux uropathy: Secondary | ICD-10-CM

## 2022-11-01 MED ORDER — OLANZAPINE 5 MG PO TABS: 5.0000 mg | ORAL_TABLET | Freq: Every day | ORAL | 3 refills | Status: DC

## 2022-11-01 MED ORDER — MIRABEGRON ER 50 MG PO TB24: 50.0000 mg | ORAL_TABLET | Freq: Every day | ORAL | 11 refills | Status: DC

## 2022-11-01 MED ORDER — TRAZODONE HCL 50 MG PO TABS: 50.0000 mg | ORAL_TABLET | Freq: Every day | ORAL | 1 refills | Status: DC

## 2022-11-01 NOTE — Progress Notes (Signed)
[name] , December 05, 1945, 76 y.o., male MRN: [id number] Patient Care Team    Relationship Specialty Notifications Start End  [name], DO PCP - General Family Medicine  12/25/15   Josue Hector, MD PCP - Cardiology Cardiology Admissions 08/20/18   Brunetta Genera, MD Consulting Physician Hematology  09/03/16   Josue Hector, MD Consulting Physician Cardiology  09/03/16   Wilford Corner, MD Consulting Physician Gastroenterology  09/03/16   Melina Schools, OD  Optometry  09/03/16   Kathie Rhodes, MD (Inactive) Consulting Physician Urology  09/10/16   Hollar, Katharine Look, MD Referring Physician Dermatology  06/29/19     Chief Complaint  Patient presents with   Depression    [facility]; pt is not fasting     Subjective: Pt presents for an OV for follow-up on changes to his medications last month Recurrent major depressive disorder, in partial remission (HCC)/memory changes Patient feels that his depression is better than last visit.  He reports compliance with trazodone 150 mg nightly, Paxil 40 mg daily and Wellbutrin 300 mg daily and Zyprexa 2.5 mg nightly.  Last visit we started Zyprexa and decrease the trazodone to help him better with his insomnia and anxiousness. Prior note: He was having some increase in symptoms, but also experiencing mild cognitive changes. .  Current medication regimen of trazodone, paxil and Wellbutrin. He denies negative side effects occasions.  He and his wife both have noticed he is having some memory changes which have him concerned- which has mildly improved since stopping Remeron and starting low dose paxil.  He reports both his mother and his sisters had dementia.  He feels he is becoming more forgetful.  His wife has told him he will talk about the same things that they had already talked about prior.  Gastroesophageal reflux disease, esophagitis presence not specified/diarrhea/bloating  EGD 03/2016 by Dr. Michail Sermon with signs of barrett's  esophagus. Patient was tried on lower dose medication, and was unable to tolerate secondary to return of symptoms.  His symptoms are well controlled with protonix 40 BID and pepcid BID.  He is due for his colonoscopy and reports he is having more frequent diarrhea and bloating.  A new referral was placed for him last visit.  COPD [facility]) He uses Symbicort daily and feels it is working well for him. Spiriva needed during certain times of the year only but has not used in quite some time. His pneumonia series is completed.  Symptoms are well controlled on Symbicort only as needed now.  Atherosclerosis of native coronary artery of native heart without angina pectoris/Essential hypertension/HYPERCHOLESTEROLEMIA/History of MI (myocardial infarction)/IDA He has known CAD with prior LAD stent from 2007. Pt reports compliance with metoprolol 50 mg daily and amlodipine 5 mg daily Qd, losartan 100 mg daily. Patient denies chest pain, shortness of breath, dizziness or lower extremity edema.   Pt takes a daily Plavix. Pt is  prescribed statin and followed by Dr. Johnsie Cancel Cardiology.  Diet: Low-sodium RF: Hypertension, hyperlipidemia, diabetes  Type 2 diabetes mellitus without complication, without long-term current use of insulin ([place]) patient reports compliance with Farxiga 10 mg daily.  He had diarrhea  off metformin trial as well.  We discontinued metformin regardless, to not cause the potential of worsening diarrhea.  Patient denies dizziness, hyperglycemic or hypoglycemic events. Patient denies numbness, tingling in the extremities or nonhealing wounds of feet.         11/01/2022    1:15 PM  08/27/2022    3:23 PM 11/28/2021   11:02 AM 09/07/2021   11:06 AM 05/09/2021   10:25 AM  Depression screen PHQ 2/9  Decreased Interest 0 2 0 3 0  Down, Depressed, Hopeless _0 PHQ - 2 Score _1 Altered sleeping _2 Tired, decreased energy _3 0  Change in appetite 0 2  2 0  Feeling bad or  failure about yourself  0 1  0 0  Trouble concentrating _4 Moving slowly or fidgety/restless 0 1  0 3  Suicidal thoughts 0 0  0 0  PHQ-9 Score _5 Difficult doing work/chores     Somewhat difficult      11/01/2022    1:15 PM 08/27/2022    3:24 PM 09/07/2021   11:07 AM 03/08/2021   11:05 AM  GAD 7 : Generalized Anxiety Score  Nervous, Anxious, on Edge 0 0 2 3  Control/stop worrying _6 Worry too much - different things _7 Trouble relaxing _8 Restless _9 Easily annoyed or irritable _10 Afraid - awful might happen 0 0 0 0  Total GAD 7 Score _11 Allergies  Allergen Reactions   Latex Hives   Altace [Ramipril] Cough   Codeine     Makes sick   Losartan Cough    cough   Remeron [Mirtazapine] Other (See Comments)    Cognitive changes   Tape     Blisters    Zantac [Ranitidine Hcl] Dermatitis   Social History   Social History Narrative   Lives at home with wife. Married to [name].   Retired Production designer, theatre/television/film truck.    Drinks caffeine (3 cups coffee, 2 sodas per day),  Takes a daily vitamin, wears a seatbelt   Wears a bicycle helmet.    Has dentures (partial on top)   Smoke detector in the home. No firearms in the home.    Feels safe in relationships.     Right-handed.   Past Medical History:  Diagnosis Date   Achilles tendinitis of left lower extremity 07/20/2018   Adenomatous colon polyp 2011   Barrett esophagus 12/2013   EGD   CAD (coronary artery disease)    ETT/Lexiscan-Myoview (11/14):  Normal; no ischemia, EF 67%   CKD (chronic kidney disease), stage III (HCC)    Colon polyp 09/2010   adenoma   COPD (chronic obstructive pulmonary disease) ([place]) 2009   spirometry with "moderate COPD"   Depression    Diabetes mellitus without complication (HCC)    Dyspnea    GERD (gastroesophageal reflux disease)    GI bleed    after polypectomy/admitted   Hair loss 08/07/2018   HTN (hypertension)    Hypercholesterolemia     Hypomagnesemia 09/15/2022   Memory loss    Myocardial infarction ([place])    Obesity    Pneumonia 09/15/2022   Sleep apnea    Spontaneous pneumothorax    Synovial cyst of right popliteal space 07/20/2018   Vertigo    Past Surgical History:  Procedure Laterality Date   APPENDECTOMY     CORONARY ANGIOPLASTY WITH STENT PLACEMENT  2007   LAD   PLEURAL SCARIFICATION  1987   TONSILLECTOMY     VARICOCELECTOMY  Family History  Problem Relation Age of Onset   Heart disease Mother    CVA Mother    Dementia Mother    Coronary artery disease Father 92   Heart disease Father    Stroke Father    Dementia Sister    Cancer - Other Brother    Hypertension Other    Hyperlipidemia Other    Colon polyps Sister    Cancer Sister    Allergies as of 11/01/2022       Reactions   Latex Hives   Altace [ramipril] Cough   Codeine    Makes sick   Losartan Cough   cough   Remeron [mirtazapine] Other (See Comments)   Cognitive changes   Tape    Blisters   Zantac [ranitidine Hcl] Dermatitis        Medication List        Accurate as of November 01, 2022  5:05 PM. If you have any questions, ask your nurse or doctor.          Accu-Chek Guide test strip Generic drug: glucose blood 1 each by Other route daily.   amLODipine 5 MG tablet Commonly known as: NORVASC Take 1 tablet (5 mg total) by mouth daily.   atorvastatin 80 MG tablet Commonly known as: LIPITOR Take 1 tablet (80 mg total) by mouth daily.   blood glucose meter kit and supplies Dispense based on patient and insurance preference. Use up to three times daily as directed. (FOR ICD-10 E10.9, E11.9).   buPROPion 300 MG 24 hr tablet Commonly known as: WELLBUTRIN XL Take 1 tablet (300 mg total) by mouth daily.   clopidogrel 75 MG tablet Commonly known as: PLAVIX Take 1 tablet (75 mg total) by mouth daily. Please make overdue appt with Dr. Johnsie Cancel before anymore refills. Thank you 1st attempt   dapagliflozin propanediol  10 MG Tabs tablet Commonly known as: Farxiga Take 1 tablet (10 mg total) by mouth daily before breakfast.   famotidine 20 MG tablet Commonly known as: Pepcid Take 1 tablet (20 mg total) by mouth 2 (two) times daily.   iron polysaccharides 150 MG capsule Commonly known as: NIFEREX Take 1 capsule (150 mg total) by mouth daily.   metoprolol tartrate 50 MG tablet Commonly known as: LOPRESSOR Take 1 tablet (50 mg total) by mouth daily.   multivitamin tablet Take 1 tablet by mouth daily.   Myrbetriq 25 MG Tb24 tablet Generic drug: mirabegron ER Take 1 tablet (25 mg total) by mouth daily. What changed: Another medication with the same name was added. Make sure you understand how and when to take each. Changed by: Howard Pouch, DO   mirabegron ER 50 MG Tb24 tablet Commonly known as: Myrbetriq Take 1 tablet (50 mg total) by mouth daily. What changed: You were already taking a medication with the same name, and this prescription was added. Make sure you understand how and when to take each. Changed by: Howard Pouch, DO   nitroGLYCERIN 0.4 MG SL tablet Commonly known as: NITROSTAT Place 1 tablet (0.4 mg total) under the tongue every 5 (five) minutes as needed for chest pain (3 doses max).   OLANZapine 5 MG tablet Commonly known as: ZYPREXA Take 1 tablet (5 mg total) by mouth at bedtime. What changed:  medication strength how much to take Changed by: Howard Pouch, DO   pantoprazole 40 MG tablet Commonly known as: PROTONIX Take 1 tablet (40 mg total) by mouth 2 (two) times daily.   PARoxetine 40  MG tablet Commonly known as: PAXIL Take 1 tablet (40 mg total) by mouth daily.   Symbicort 160-4.5 MCG/ACT inhaler Generic drug: budesonide-formoterol Inhale 2 puffs into the lungs 2 (two) times daily.   tamsulosin 0.4 MG Caps capsule Commonly known as: FLOMAX   traZODone 50 MG tablet Commonly known as: DESYREL Take 1 tablet (50 mg total) by mouth at bedtime. What changed: how  much to take Changed by: Howard Pouch, DO   VITAMIN B 12 PO Take by mouth.        All past medical history, surgical history, allergies, family history, immunizations andmedications were updated in the EMR today and reviewed under the history and medication portions of their EMR.     ROS Negative, with the exception of above mentioned in HPI   Objective:  BP 136/63   Pulse 68   Temp 98 F (36.7 C) (Oral)   Ht _0  (1.778 m)   Wt 197 lb (89.4 kg)   SpO2 96%   BMI 28.27 kg/m  Body mass index is 28.27 kg/m. Physical Exam Vitals and nursing note reviewed.  Constitutional:      General: He is not in acute distress.    Appearance: Normal appearance. He is not ill-appearing, toxic-appearing or diaphoretic.  HENT:     Head: Normocephalic and atraumatic.     Mouth/Throat:     Mouth: Mucous membranes are moist.  Eyes:     General: No scleral icterus.       Right eye: No discharge.        Left eye: No discharge.     Extraocular Movements: Extraocular movements intact.     Pupils: Pupils are equal, round, and reactive to light.  Cardiovascular:     Rate and Rhythm: Normal rate and regular rhythm.  Pulmonary:     Effort: Pulmonary effort is normal. No respiratory distress.     Breath sounds: Normal breath sounds. No wheezing, rhonchi or rales.  Musculoskeletal:     Cervical back: Neck supple.     Right lower leg: No edema.     Left lower leg: No edema.  Lymphadenopathy:     Cervical: No cervical adenopathy.  Skin:    General: Skin is warm and dry.     Coloration: Skin is not jaundiced or pale.     Findings: No rash.  Neurological:     Mental Status: He is alert and oriented to person, place, and time. Mental status is at baseline.  Psychiatric:        Mood and Affect: Mood normal.        Behavior: Behavior normal.        Thought Content: Thought content normal.        Judgment: Judgment normal.     No results found. No results found. No results found for  this or any previous visit (from the past 24 hour(s)).  Assessment/Plan: WILBERT HAYASHI is a 76 y.o. male present for OV for  Recurrent major depressive disorder, in partial remission (HCC) Stable-improved Increase Zyprexa 5 mg nightly Continue paxil to 40 mg Decrease trazodone 50 mg nightly-if able would like to taper off this medication since it was not working well for him and increase Zyprexa if needed. Continue wellbutrin 300 QD. -Tried meds: Remeron (possible memory changes) - Follow-up 4 months   Gastroesophageal reflux disease, esophagitis presence not specified Stable Continue Protonix 40 mg twice a day, was unable to take back secondary to recurrence of symptoms. Continue Pepcid  twice daily today Collected B12, vitamin D and mag   Chronic bronchitis, unspecified chronic bronchitis type (HCC) Stable Continue Symbicort twice daily  Atherosclerosis of native coronary artery of native heart without angina pectoris/Essential hypertension/HYPERCHOLESTEROLEMIA/History of MI (myocardial infarction) Stage 3 chronic kidney disease/ iron deficiency anemia/acquired thrombophilia Stable - following with cards, Dr. Johnsie Cancel. Continue atorvastatin Continue metoprolol Continue amlodipine to 5 mg daily -  plavix continue per cardiology - low sodium diet and exercise encouraged.   Type 2 diabetes mellitus with hyperlipidemia/CKD 3 - A1c today 5.8--> 6.0-->6.1>>6.7> 6.5> 7.1 >7.1> 6.7> 6.8 >7.8> 7.5> 8.1 > collected today.   Goal of A1c less than 7 Continue Farxiga 10 mg daily May need second agent, consider injectable if so. Tried: Metformin-discontinued secondary to 10 show worsening diarrhea, [name]-patient has reservations about using since hospitalization for COVID-pneumonia and [name] occurring at the same time. Continue atorvastatin 80 - Foot exam: Completed today 08/27/2022 - eye exam: Completed 06/25/2021-patient was encouraged to schedule his eye exam. - PNA: Series  completed - Flu: Completed today 08/27/2022 - Urine Microalbumin w/creat. Ratio: completed 08/27/2022 BMP collected today  Cerebral atrophy/microvascular ischemic occlusive disease Patient is having mild cognitive changes. Referral had been placed to neurology Patient is on a statin  Iron deficiency anemia: Currently on supplementation Iron panel collected today  Nocturia Encouraged him to follow-up with his urologist.  He is on Flomax prescribed by urology. Increase Myrbetriq to 50 mg  He understands he needs to be on medicine at least 4 weeks to find full benefit of that particular dose     Reviewed expectations re: course of current medical issues. Discussed self-management of symptoms. Outlined signs and symptoms indicating need for more acute intervention. Patient verbalized understanding and all questions were answered. Patient received an After-Visit Summary.    Orders Placed This Encounter  Procedures   Basic Metabolic Panel (BMET)   Iron, TIBC and Ferritin Panel   Hemoglobin A1c   Vitamin D (25 hydroxy)   Magnesium   B12   Meds ordered this encounter  Medications   OLANZapine (ZYPREXA) 5 MG tablet    Sig: Take 1 tablet (5 mg total) by mouth at bedtime.    Dispense:  30 tablet    Refill:  3   traZODone (DESYREL) 50 MG tablet    Sig: Take 1 tablet (50 mg total) by mouth at bedtime.    Dispense:  90 tablet    Refill:  1   mirabegron ER (MYRBETRIQ) 50 MG TB24 tablet    Sig: Take 1 tablet (50 mg total) by mouth daily.    Dispense:  30 tablet    Refill:  11   Referral Orders  No referral(s) requested today      Note is dictated utilizing voice recognition software. Although note has been proof read prior to signing, occasional typographical errors still can be missed. If any questions arise, please do not hesitate to call for verification.   electronically signed by:  Howard Pouch, DO  [place]

## 2022-11-01 NOTE — Patient Instructions (Addendum)
Return in about 15 weeks (around 02/14/2023) for Routine chronic condition follow-up.        Great to see you today.  I have refilled the medication(s) we provide.   If labs were collected, we will inform you of lab results once received either by echart message or telephone call.   - echart message- for normal results that have been seen by the patient already.   - telephone call: abnormal results or if patient has not viewed results in their echart.

## 2022-11-02 LAB — BASIC METABOLIC PANEL
BUN/Creatinine Ratio: 16 (calc) (ref 6–22)
BUN: 22 mg/dL (ref 7–25)
CO2: 29 mmol/L (ref 20–32)
Calcium: 9.7 mg/dL (ref 8.6–10.3)
Chloride: 100 mmol/L (ref 98–110)
Creat: 1.4 mg/dL — ABNORMAL HIGH (ref 0.70–1.28)
Glucose, Bld: 293 mg/dL — ABNORMAL HIGH (ref 65–99)
Potassium: 4.9 mmol/L (ref 3.5–5.3)
Sodium: 138 mmol/L (ref 135–146)

## 2022-11-02 LAB — IRON,TIBC AND FERRITIN PANEL
%SAT: 18 % (calc) — ABNORMAL LOW (ref 20–48)
Ferritin: 36 ng/mL (ref 24–380)
Iron: 57 ug/dL (ref 50–180)
TIBC: 310 mcg/dL (calc) (ref 250–425)

## 2022-11-02 LAB — MAGNESIUM: Magnesium: 2 mg/dL (ref 1.5–2.5)

## 2022-11-02 LAB — VITAMIN D 25 HYDROXY (VIT D DEFICIENCY, FRACTURES): Vit D, 25-Hydroxy: 36 ng/mL (ref 30–100)

## 2022-11-02 LAB — HEMOGLOBIN A1C
Hgb A1c MFr Bld: 8.3 % of total Hgb — ABNORMAL HIGH (ref <5.7)
Mean Plasma Glucose: 192 mg/dL
eAG (mmol/L): 10.6 mmol/L

## 2022-11-02 LAB — VITAMIN B12: Vitamin B-12: 998 pg/mL (ref 200–1100)

## 2022-11-04 ENCOUNTER — Telehealth: Payer: Self-pay | Admitting: Family Medicine

## 2022-11-04 MED ORDER — GLIPIZIDE 5 MG PO TABS: ORAL_TABLET | ORAL | 1 refills | Status: DC

## 2022-11-04 NOTE — Telephone Encounter (Signed)
Please inform patient: Kidney function is stable compared to prior to hospitalization.  However when he was well-hydrated after hospitalization his kidney function was even better than now.  So I do encourage him to hydrate well. Magnesium levels are normal B12 and vitamin D levels are excellent. Iron levels are at the low end of normal.  His iron saturations are just mildly low.  I would encourage him to try to to take the iron a little more frequently than he had been. His A1c is 8.3.  He is going to need something more than just the [place] in order to take an A1c down from 8.3 to a goal which is below 7.  Have called in a medication called glipizide to take once daily.  He will need to take this medication with a meal.  Recommend he take with the first meal of the day with breakfast or lunch.

## 2022-11-04 NOTE — Telephone Encounter (Signed)
Spoke with pt regarding labs and instructions.   

## 2022-11-05 ENCOUNTER — Telehealth: Payer: Self-pay

## 2022-11-05 NOTE — Telephone Encounter (Signed)
Patient wife Di Kindle [facility] about visit with Dr. Raoul Pitch and new medication that was prescribed for his blood sugar.  Glipizide.  Does he take at the same time as [place]?  Please call [phone]

## 2022-11-05 NOTE — Telephone Encounter (Signed)
Please advise if any other precaution other than take with food for medication.

## 2022-11-05 NOTE — Telephone Encounter (Signed)
Glipizide must be taken with a meal.  If the [place] happens to occur at that meal it is okay to take them together

## 2022-11-06 NOTE — Telephone Encounter (Signed)
Spoke with pt regarding medication and instructions.  

## 2022-11-23 ENCOUNTER — Other Ambulatory Visit: Payer: Self-pay | Admitting: Family Medicine

## 2022-12-04 ENCOUNTER — Ambulatory Visit (INDEPENDENT_AMBULATORY_CARE_PROVIDER_SITE_OTHER): Payer: Medicare HMO

## 2022-12-04 DIAGNOSIS — Z0000 Encounter for general adult medical examination without abnormal findings: Secondary | ICD-10-CM | POA: Diagnosis not present

## 2022-12-04 NOTE — Progress Notes (Signed)
Subjective:   [name] is a 77 y.o. male who presents for Medicare Annual/Subsequent preventive examination. I connected with  [name] on 12/04/22 by a audio enabled telemedicine application and verified that I am speaking with the correct person using two identifiers.  Patient Location: Home  Provider Location: Office/Clinic  I discussed the limitations of evaluation and management by telemedicine. The patient expressed understanding and agreed to proceed.   Review of Systems    Defer to PCP       Objective:    There were no vitals filed for this visit. There is no height or weight on file to calculate BMI.     12/04/2022    1:59 PM 09/10/2022    9:19 AM 11/28/2021   11:03 AM 11/22/2020    3:05 PM 06/29/2019   10:10 AM 06/22/2018   10:28 AM 06/16/2017   11:11 AM  Advanced Directives  Does Patient Have a Medical Advance Directive? No No Yes No Yes Yes Yes  Type of Advance Directive   Living will  [place];Living will [place];Living will [place];Living will  Copy of [place] in Chart?     No - copy requested No - copy requested No - copy requested  Would patient like information on creating a medical advance directive?    Yes (MAU/Ambulatory/Procedural Areas - Information given)       Current Medications (verified) Outpatient Encounter Medications as of 12/04/2022  Medication Sig   amLODipine (NORVASC) 5 MG tablet Take 1 tablet (5 mg total) by mouth daily.   atorvastatin (LIPITOR) 80 MG tablet Take 1 tablet (80 mg total) by mouth daily.   blood glucose meter kit and supplies Dispense based on patient and insurance preference. Use up to three times daily as directed. (FOR ICD-10 E10.9, E11.9).   buPROPion (WELLBUTRIN XL) 300 MG 24 hr tablet Take 1 tablet (300 mg total) by mouth daily.   clopidogrel (PLAVIX) 75 MG tablet Take 1 tablet (75 mg total) by mouth daily. Please make overdue appt  with Dr. Johnsie Cancel before anymore refills. Thank you 1st attempt   Cyanocobalamin (VITAMIN B 12 PO) Take by mouth.   dapagliflozin propanediol (FARXIGA) 10 MG TABS tablet Take 1 tablet (10 mg total) by mouth daily before breakfast.   famotidine (PEPCID) 20 MG tablet Take 1 tablet (20 mg total) by mouth 2 (two) times daily.   glipiZIDE (GLUCOTROL) 5 MG tablet Once daily with first meal of the day. Take with a meal.   glucose blood (ACCU-CHEK GUIDE) test strip 1 each by Other route daily.   iron polysaccharides (NIFEREX) 150 MG capsule Take 1 capsule (150 mg total) by mouth daily.   metoprolol tartrate (LOPRESSOR) 50 MG tablet Take 1 tablet (50 mg total) by mouth daily.   mirabegron ER (MYRBETRIQ) 50 MG TB24 tablet Take 1 tablet (50 mg total) by mouth daily.   Multiple Vitamin (MULTIVITAMIN) tablet Take 1 tablet by mouth daily.   MYRBETRIQ 25 MG TB24 tablet Take 1 tablet (25 mg total) by mouth daily.   nitroGLYCERIN (NITROSTAT) 0.4 MG SL tablet Place 1 tablet (0.4 mg total) under the tongue every 5 (five) minutes as needed for chest pain (3 doses max).   OLANZapine (ZYPREXA) 5 MG tablet Take 1 tablet (5 mg total) by mouth at bedtime.   pantoprazole (PROTONIX) 40 MG tablet Take 1 tablet (40 mg total) by mouth 2 (two) times daily.  PARoxetine (PAXIL) 40 MG tablet Take 1 tablet (40 mg total) by mouth daily.   SYMBICORT 160-4.5 MCG/ACT inhaler Inhale 2 puffs into the lungs 2 (two) times daily.   tamsulosin (FLOMAX) 0.4 MG CAPS capsule    traZODone (DESYREL) 50 MG tablet Take 1 tablet (50 mg total) by mouth at bedtime.   No facility-administered encounter medications on file as of 12/04/2022.    Allergies (verified) Latex, Altace [ramipril], Codeine, Losartan, Remeron [mirtazapine], Tape, and Zantac [ranitidine hcl]   History: Past Medical History:  Diagnosis Date   Achilles tendinitis of left lower extremity 07/20/2018   Adenomatous colon polyp 2011   Barrett esophagus 12/2013   EGD   CAD  (coronary artery disease)    ETT/Lexiscan-Myoview (11/14):  Normal; no ischemia, EF 67%   CKD (chronic kidney disease), stage III (HCC)    Colon polyp 09/2010   adenoma   COPD (chronic obstructive pulmonary disease) ([place]) 2009   spirometry with "moderate COPD"   Depression    Diabetes mellitus without complication (HCC)    Dyspnea    GERD (gastroesophageal reflux disease)    GI bleed    after polypectomy/admitted   Hair loss 08/07/2018   HTN (hypertension)    Hypercholesterolemia    Hypomagnesemia 09/15/2022   Memory loss    Myocardial infarction ([place])    Obesity    Pneumonia 09/15/2022   Sleep apnea    Spontaneous pneumothorax    Synovial cyst of right popliteal space 07/20/2018   Vertigo    Past Surgical History:  Procedure Laterality Date   APPENDECTOMY     CORONARY ANGIOPLASTY WITH STENT PLACEMENT  2007   LAD   PLEURAL SCARIFICATION  1987   TONSILLECTOMY     VARICOCELECTOMY     Family History  Problem Relation Age of Onset   Heart disease Mother    CVA Mother    Dementia Mother    Coronary artery disease Father 31   Heart disease Father    Stroke Father    Dementia Sister    Cancer - Other Brother    Hypertension Other    Hyperlipidemia Other    Colon polyps Sister    Cancer Sister    Social History   Socioeconomic History   Marital status: Married    Spouse name: Not on file   Number of children: 2   Years of education: come college   Highest education level: Not on file  Occupational History   Occupation: retired Furniture conservator/restorer  Tobacco Use   Smoking status: Former    Types: Cigarettes    Quit date: 11/25/1998    Years since quitting: 24.0   Smokeless tobacco: Never  Vaping Use   Vaping Use: Never used  Substance and Sexual Activity   Alcohol use: No   Drug use: No   Sexual activity: Not Currently  Other Topics Concern   Not on file  Social History Narrative   Lives at home with wife. Married to [name].   Retired Production designer, theatre/television/film truck.    Drinks  caffeine (3 cups coffee, 2 sodas per day),  Takes a daily vitamin, wears a seatbelt   Wears a bicycle helmet.    Has dentures (partial on top)   Smoke detector in the home. No firearms in the home.    Feels safe in relationships.     Right-handed.   Social Determinants of Health   Financial Resource Strain: Low Risk  (12/04/2022)   Overall Financial Resource Strain (CARDIA)  Difficulty of Paying Living Expenses: Not very hard  Food Insecurity: No Food Insecurity (12/04/2022)   Hunger Vital Sign    Worried About Running Out of Food in the Last Year: Never true    Ran Out of Food in the Last Year: Never true  Transportation Needs: No Transportation Needs (12/04/2022)   PRAPARE - Hydrologist (Medical): No    Lack of Transportation (Non-Medical): No  Physical Activity: Inactive (12/04/2022)   Exercise Vital Sign    Days of Exercise per Week: 0 days    Minutes of Exercise per Session: 0 min  Stress: No Stress Concern Present (12/04/2022)   [place]    Feeling of Stress : Only a little  Social Connections: Moderately Isolated (12/04/2022)   Social Connection and Isolation Panel [NHANES]    Frequency of Communication with Friends and Family: More than three times a week    Frequency of Social Gatherings with Friends and Family: More than three times a week    Attends Religious Services: Never    Marine scientist or Organizations: No    Attends Music therapist: Never    Marital Status: Married    Tobacco Counseling Counseling given: Not Answered   Clinical Intake:  Pre-visit preparation completed: Yes  Pain : No/denies pain     Nutritional Risks: None Diabetes: Yes CBG done?: No Did pt. bring in CBG monitor from home?: No  How often do you need to have someone help you when you read instructions, pamphlets, or other written materials from your doctor or  pharmacy?: 1 - Never What is the last grade level you completed in school?: N/A  Diabetic?yes  Nutrition Risk Assessment:  Has the patient had any N/V/D within the last 2 months?  No  Does the patient have any non-healing wounds?  No  Has the patient had any unintentional weight loss or weight gain?  No   Diabetes:  Is the patient diabetic?  Yes  If diabetic, was a CBG obtained today?   N/A Did the patient bring in their glucometer from home?   N/A How often do you monitor your CBG's? Daily.   Financial Strains and Diabetes Management:  Are you having any financial strains with the device, your supplies or your medication? No .  Does the patient want to be seen by Chronic Care Management for management of their diabetes?  No  Would the patient like to be referred to a Nutritionist or for Diabetic Management?  No   Diabetic Exams:  Diabetic Eye Exam: Overdue for diabetic eye exam. Pt has been advised about the importance in completing this exam. Patient advised to call and schedule an eye exam. Diabetic Foot Exam: Completed 08/27/2022   Interpreter Needed?: No  Information entered by :: Valli Glance   Activities of Daily Living    12/04/2022    2:00 PM  In your present state of health, do you have any difficulty performing the following activities:  Hearing? 0  Vision? 0  Difficulty concentrating or making decisions? 1  Walking or climbing stairs? 0  Dressing or bathing? 0  Doing errands, shopping? 0  Preparing Food and eating ? N  Using the Toilet? N  In the past six months, have you accidently leaked urine? N  Do you have problems with loss of bowel control? Y  Managing your Medications? N  Managing your Finances? N  Housekeeping or  managing your Housekeeping? N    Patient Care Team: [name], DO as PCP - l (Family Medicine) Josue Hector, MD as PCP - Cardiology (Cardiology) Brunetta , MD as Consulting Physician  (Hematology) Josue Hector, MD as Consulting Physician (Cardiology) Wilford Corner, MD as Consulting Physician (Gastroenterology) Melina Schools, OD (Optometry) Kathie Rhodes, MD (Inactive) as Consulting Physician (Urology) Hollar, Katharine Look, MD as Referring Physician (Dermatology)  Indicate any recent Medical Services you may have received from other than [facility] providers in the past year (date may be approximate).     Assessment:   This is a routine wellness examination for Hiram.  Hearing/Vision screen No results found.  Dietary issues and exercise activities discussed:     Goals Addressed   None   Depression Screen    12/04/2022    1:55 PM 11/01/2022    1:15 PM 08/27/2022    3:23 PM 11/28/2021   11:02 AM 09/07/2021   11:06 AM 05/09/2021   10:25 AM 03/08/2021   11:04 AM  PHQ 2/9 Scores  PHQ - 2 Score '2 1 4 1 6 3 6  '$ PHQ- 9 Score '2 4 15  15 10 13    '$ Fall Risk    12/04/2022    1:59 PM 11/01/2022    1:02 PM 11/28/2021   11:04 AM 09/07/2021   10:55 AM 11/22/2020    3:09 PM  Fall Risk   Falls in the past year? 0 0 0 0 0  Number falls in past yr: 0 0 0 0 0  Injury with Fall? 0 0 0 0 0  Risk for fall due to : No Fall Risks No Fall Risks Impaired vision;Impaired balance/gait    Follow up Falls evaluation completed Falls evaluation completed Falls prevention discussed Falls evaluation completed Falls prevention discussed    FALL RISK PREVENTION PERTAINING TO THE HOME:  Any stairs in or around the home? Yes  If so, are there any without handrails? No  Home free of loose throw rugs in walkways, pet beds, electrical cords, etc? Yes  Adequate lighting in your home to reduce risk of falls? Yes   ASSISTIVE DEVICES UTILIZED TO PREVENT FALLS:  Life alert? No  Use of a cane, walker or w/c? No  Grab bars in the bathroom? Yes  Shower chair or bench in shower? No  Elevated toilet seat or a handicapped toilet? No   TIMED UP AND GO:  Was the test performed? No .  Length  of time to ambulate 10 feet: 0 sec.     Cognitive Function:    08/16/2020   11:19 AM 06/29/2019   10:12 AM 06/22/2018   10:32 AM 06/16/2017   11:13 AM  MMSE - Mini Mental State Exam  Orientation to time '5 5 5 5  '$ Orientation to Place '5 5 5 5  '$ Registration '3 3 3 3  '$ Attention/ Calculation '5 5 5 5  '$ Recall 2 2 0 3  Language- name 2 objects '2 2 2 2  '$ Language- repeat '1 1 1 1  '$ Language- follow 3 step command '3 3 3 3  '$ Language- read & follow direction '1 1 1 1  '$ Write a sentence '1 1 1 1  '$ Copy design '1 1 1 1  '$ Total score '29 29 27 30        '$ 12/04/2022    2:02 PM 11/28/2021   11:09 AM 11/22/2020    3:16 PM  6CIT Screen  What Year? 0 points 0 points  0 points  What month? 0 points 0 points 0 points  What time? 0 points 0 points 0 points  Count back from 20 0 points 0 points 0 points  Months in reverse 4 points 4 points 2 points  Repeat phrase 4 points 6 points 2 points  Total Score 8 points 10 points 4 points    Immunizations Immunization History  Administered Date(s) Administered   Fluad Quad(high Dose 65+) 08/18/2019, 11/09/2020, 09/07/2021, 08/27/2022   Influenza, High Dose Seasonal PF 09/03/2016, 09/15/2017, 08/07/2018   PFIZER(Purple Top)SARS-COV-2 Vaccination 01/20/2020, 02/09/2020, 01/03/2021   Pneumococcal Conjugate-13 09/03/2016   Pneumococcal Polysaccharide-23 07/10/2017   Tdap 01/15/2016    TDAP status: Up to date  Flu Vaccine status: Up to date  Pneumococcal vaccine status: Up to date  Covid-19 vaccine status: Information provided on how to obtain vaccines.   Qualifies for Shingles Vaccine? Yes   Zostavax completed  N/A   Shingrix Completed?: No.    Education has been provided regarding the importance of this vaccine. Patient has been advised to call insurance company to determine out of pocket expense if they have not yet received this vaccine. Advised may also receive vaccine at local pharmacy or Health Dept. Verbalized acceptance and understanding.  Screening  Tests Health Maintenance  Topic Date Due   Zoster Vaccines- Shingrix (1 of 2) Never done   OPHTHALMOLOGY EXAM  06/25/2022   COLONOSCOPY (Pts 45-70yr Insurance coverage will need to be confirmed)  08/28/2023 (Originally 04/02/2021)   HEMOGLOBIN A1C  05/03/2023   Diabetic kidney evaluation - Urine ACR  08/28/2023   FOOT EXAM  08/28/2023   Diabetic kidney evaluation - eGFR measurement  11/02/2023   Medicare Annual Wellness (AWV)  12/05/2023   DTaP/Tdap/Td (2 - Td or Tdap) 01/14/2026   Pneumonia Vaccine 77 Years old  Completed   INFLUENZA VACCINE  Completed   Hepatitis C Screening  Completed   HPV VACCINES  Aged Out   COVID-19 Vaccine  Discontinued    Health Maintenance  Health Maintenance Due  Topic Date Due   Zoster Vaccines- Shingrix (1 of 2) Never done   OPHTHALMOLOGY EXAM  06/25/2022    Colorectal cancer screening: Type of screening: Colonoscopy. Completed 04/02/2016. Repeat every 5 years  Lung Cancer Screening: (Low Dose CT Chest recommended if Age 76-80years, 30 pack-year currently smoking OR have quit w/in 15years.) does not qualify.   Lung Cancer Screening Referral: N/A  Additional Screening:  Hepatitis C Screening: does qualify; Completed 05/20/2016  Vision Screening: Recommended annual ophthalmology exams for early detection of glaucoma and other disorders of the eye. Is the patient up to date with their annual eye exam?  Yes  Who is the provider or what is the name of the office in which the patient attends annual eye exams? [facility]  If pt is not established with a provider, would they like to be referred to a provider to establish care?  N/A .   Dental Screening: Recommended annual dental exams for proper oral hygiene  Community Resource Referral / Chronic Care Management: CRR required this visit?  No   CCM required this visit?  No      Plan:     I have personally reviewed and noted the following in the patient's chart:   Medical and social  history Use of alcohol, tobacco or illicit drugs  Current medications and supplements including opioid prescriptions. Patient is currently taking opioid prescriptions. Information provided to patient regarding non-opioid alternatives. Patient advised to discuss  non-opioid treatment plan with their provider. Functional ability and status Nutritional status Physical activity Advanced directives List of other physicians Hospitalizations, surgeries, and ER visits in previous 12 months Vitals Screenings to include cognitive, depression, and falls Referrals and appointments  In addition, I have reviewed and discussed with patient certain preventive protocols, quality metrics, and best practice recommendations. A written personalized care plan for preventive services as well as general preventive health recommendations were provided to patient.     Edgar Frisk, [facility]   12/04/2022   Nurse Notes: Non Face to Face 30 minute visit.   Mr. Faith , Thank you for taking time to come for your Medicare Wellness Visit. I appreciate your ongoing commitment to your health goals. Please review the following plan we discussed and let me know if I can assist you in the future.   These are the goals we discussed:  Goals      Patient Stated     Lose weight      Weight (lb) < 180 lb (81.6 kg)     Lose weight by increasing activity.         This is a list of the screening recommended for you and due dates:  Health Maintenance  Topic Date Due   Zoster (Shingles) Vaccine (1 of 2) Never done   Eye exam for diabetics  06/25/2022   Colon Cancer Screening  08/28/2023*   Hemoglobin A1C  05/03/2023   Yearly kidney health urinalysis for diabetes  08/28/2023   Complete foot exam   08/28/2023   Yearly kidney function blood test for diabetes  11/02/2023   Medicare Annual Wellness Visit  12/05/2023   DTaP/Tdap/Td vaccine (2 - Td or Tdap) 01/14/2026   Pneumonia Vaccine  Completed   Flu Shot  Completed    Hepatitis C Screening: USPSTF Recommendation to screen - Ages 38-79 yo.  Completed   HPV Vaccine  Aged Out   COVID-19 Vaccine  Discontinued  *Topic was postponed. The date shown is not the original due date.

## 2022-12-04 NOTE — Patient Instructions (Signed)
Health Maintenance, Male Adopting a healthy lifestyle and getting preventive care are important in promoting health and wellness. Ask your health care provider about: The right schedule for you to have regular tests and exams. Things you can do on your own to prevent diseases and keep yourself healthy. What should I know about diet, weight, and exercise? Eat a healthy diet  Eat a diet that includes plenty of vegetables, fruits, low-fat dairy products, and lean protein. Do not eat a lot of foods that are high in solid fats, added sugars, or sodium. Maintain a healthy weight Body mass index (BMI) is a measurement that can be used to identify possible weight problems. It estimates body fat based on height and weight. Your health care provider can help determine your BMI and help you achieve or maintain a healthy weight. Get regular exercise Get regular exercise. This is one of the most important things you can do for your health. Most adults should: Exercise for at least 150 minutes each week. The exercise should increase your heart rate and make you sweat (moderate-intensity exercise). Do strengthening exercises at least twice a week. This is in addition to the moderate-intensity exercise. Spend less time sitting. Even light physical activity can be beneficial. Watch cholesterol and blood lipids Have your blood tested for lipids and cholesterol at 76 years of age, then have this test every 5 years. You may need to have your cholesterol levels checked more often if: Your lipid or cholesterol levels are high. You are older than 76 years of age. You are at high risk for heart disease. What should I know about cancer screening? Many types of cancers can be detected early and may often be prevented. Depending on your health history and family history, you may need to have cancer screening at various ages. This may include screening for: Colorectal cancer. Prostate cancer. Skin cancer. Lung  cancer. What should I know about heart disease, diabetes, and high blood pressure? Blood pressure and heart disease High blood pressure causes heart disease and increases the risk of stroke. This is more likely to develop in people who have high blood pressure readings or are overweight. Talk with your health care provider about your target blood pressure readings. Have your blood pressure checked: Every 3-5 years if you are 18-39 years of age. Every year if you are 40 years old or older. If you are between the ages of 65 and 75 and are a current or former smoker, ask your health care provider if you should have a one-time screening for abdominal aortic aneurysm (AAA). Diabetes Have regular diabetes screenings. This checks your fasting blood sugar level. Have the screening done: Once every three years after age 45 if you are at a normal weight and have a low risk for diabetes. More often and at a younger age if you are overweight or have a high risk for diabetes. What should I know about preventing infection? Hepatitis B If you have a higher risk for hepatitis B, you should be screened for this virus. Talk with your health care provider to find out if you are at risk for hepatitis B infection. Hepatitis C Blood testing is recommended for: Everyone born from 1945 through 1965. Anyone with known risk factors for hepatitis C. Sexually transmitted infections (STIs) You should be screened each year for STIs, including gonorrhea and chlamydia, if: You are sexually active and are younger than 76 years of age. You are older than 76 years of age and your   health care provider tells you that you are at risk for this type of infection. Your sexual activity has changed since you were last screened, and you are at increased risk for chlamydia or gonorrhea. Ask your health care provider if you are at risk. Ask your health care provider about whether you are at high risk for HIV. Your health care provider  may recommend a prescription medicine to help prevent HIV infection. If you choose to take medicine to prevent HIV, you should first get tested for HIV. You should then be tested every 3 months for as long as you are taking the medicine. Follow these instructions at home: Alcohol use Do not drink alcohol if your health care provider tells you not to drink. If you drink alcohol: Limit how much you have to 0-2 drinks a day. Know how much alcohol is in your drink. In the [place] one drink equals one 12 oz bottle of beer (355 mL), one 5 oz glass of wine (148 mL), or one 1 oz glass of hard liquor (44 mL). Lifestyle Do not use any products that contain nicotine or tobacco. These products include cigarettes, chewing tobacco, and vaping devices, such as e-cigarettes. If you need help quitting, ask your health care provider. Do not use street drugs. Do not share needles. Ask your health care provider for help if you need support or information about quitting drugs. General instructions Schedule regular health, dental, and eye exams. Stay current with your vaccines. Tell your health care provider if: You often feel depressed. You have ever been abused or do not feel safe at home. Summary Adopting a healthy lifestyle and getting preventive care are important in promoting health and wellness. Follow your health care provider's instructions about healthy diet, exercising, and getting tested or screened for diseases. Follow your health care provider's instructions on monitoring your cholesterol and blood pressure. This information is not intended to replace advice given to you by your health care provider. Make sure you discuss any questions you have with your health care provider. Document Revised: 04/02/2021 Document Reviewed: 04/02/2021 Elsevier Patient Education  2023 [employer]  

## 2022-12-10 ENCOUNTER — Ambulatory Visit: Payer: Medicare HMO | Admitting: Family Medicine

## 2022-12-13 ENCOUNTER — Ambulatory Visit (INDEPENDENT_AMBULATORY_CARE_PROVIDER_SITE_OTHER): Payer: Medicare HMO | Admitting: Family Medicine

## 2022-12-13 ENCOUNTER — Encounter: Payer: Self-pay | Admitting: Family Medicine

## 2022-12-13 VITALS — BP 124/83 | HR 74 | Temp 97.9°F | Wt 205.4 lb

## 2022-12-13 DIAGNOSIS — M2550 Pain in unspecified joint: Secondary | ICD-10-CM

## 2022-12-13 DIAGNOSIS — M791 Myalgia, unspecified site: Secondary | ICD-10-CM | POA: Diagnosis not present

## 2022-12-13 DIAGNOSIS — D500 Iron deficiency anemia secondary to blood loss (chronic): Secondary | ICD-10-CM | POA: Diagnosis not present

## 2022-12-13 LAB — CBC WITH DIFFERENTIAL/PLATELET
Basophils Absolute: 0 10*3/uL (ref 0.0–0.1)
Basophils Relative: 0.5 % (ref 0.0–3.0)
Eosinophils Absolute: 0.5 10*3/uL (ref 0.0–0.7)
Eosinophils Relative: 6.1 % — ABNORMAL HIGH (ref 0.0–5.0)
HCT: 37.6 % — ABNORMAL LOW (ref 39.0–52.0)
Hemoglobin: 12.4 g/dL — ABNORMAL LOW (ref 13.0–17.0)
Lymphocytes Relative: 20.3 % (ref 12.0–46.0)
Lymphs Abs: 1.7 10*3/uL (ref 0.7–4.0)
MCHC: 32.9 g/dL (ref 30.0–36.0)
MCV: 81.9 fl (ref 78.0–100.0)
Monocytes Absolute: 0.7 10*3/uL (ref 0.1–1.0)
Monocytes Relative: 8.1 % (ref 3.0–12.0)
Neutro Abs: 5.6 10*3/uL (ref 1.4–7.7)
Neutrophils Relative %: 65 % (ref 43.0–77.0)
Platelets: 254 10*3/uL (ref 150.0–400.0)
RBC: 4.59 Mil/uL (ref 4.22–5.81)
RDW: 16.8 % — ABNORMAL HIGH (ref 11.5–15.5)
WBC: 8.6 10*3/uL (ref 4.0–10.5)

## 2022-12-13 LAB — COMPREHENSIVE METABOLIC PANEL
ALT: 23 U/L (ref 0–53)
AST: 13 U/L (ref 0–37)
Albumin: 4.4 g/dL (ref 3.5–5.2)
Alkaline Phosphatase: 78 U/L (ref 39–117)
BUN: 31 mg/dL — ABNORMAL HIGH (ref 6–23)
CO2: 29 mEq/L (ref 19–32)
Calcium: 9.9 mg/dL (ref 8.4–10.5)
Chloride: 98 mEq/L (ref 96–112)
Creatinine, Ser: 1.44 mg/dL (ref 0.40–1.50)
GFR: 47.65 mL/min — ABNORMAL LOW (ref >60.00)
Glucose, Bld: 317 mg/dL — ABNORMAL HIGH (ref 70–99)
Potassium: 4.8 mEq/L (ref 3.5–5.1)
Sodium: 137 mEq/L (ref 135–145)
Total Bilirubin: 0.4 mg/dL (ref 0.2–1.2)
Total Protein: 7.2 g/dL (ref 6.0–8.3)

## 2022-12-13 LAB — SEDIMENTATION RATE: Sed Rate: 30 mm/hr — ABNORMAL HIGH (ref 0–20)

## 2022-12-13 LAB — CK: Total CK: 44 U/L (ref 7–232)

## 2022-12-13 NOTE — Progress Notes (Signed)
[name] , 09/24/1946, 77 y.o., male MRN: [id number] Patient Care Team    Relationship Specialty Notifications Start End  [name], DO PCP - General Family Medicine  12/25/15   Josue Hector, MD PCP - Cardiology Cardiology Admissions 08/20/18   Brunetta Genera, MD Consulting Physician Hematology  09/03/16   Josue Hector, MD Consulting Physician Cardiology  09/03/16   Wilford Corner, MD Consulting Physician Gastroenterology  09/03/16   Melina Schools, OD  Optometry  09/03/16   Kathie Rhodes, MD (Inactive) Consulting Physician Urology  09/10/16   Hollar, Katharine Look, MD Referring Physician Dermatology  06/29/19     Chief Complaint  Patient presents with   Joint Pain    All over     Subjective: Pt presents for an OV with complaints of joint pain of about 3 weeks duration.  Patient states it initially started in his knees, but quickly progressed to involve ankles, wrists and shoulders.  He denies any swelling of the joints or redness.  He denies any fevers or headache. We had recently started glipizide and Zyprexa.  He endorses weakness feeling in his hands/wrists and had been unable to remove lid from a jar.  This is new for him. He is prescribed atorvastatin 80 mg, which she has taken for many years without difficulty.     12/04/2022    1:55 PM 11/01/2022    1:15 PM 08/27/2022    3:23 PM 11/28/2021   11:02 AM 09/07/2021   11:06 AM  Depression screen PHQ 2/9  Decreased Interest 1 0 2 0 3  Down, Depressed, Hopeless '1 1 2 1 3  '$ PHQ - 2 Score '2 1 4 1 6  '$ Altered sleeping 0 '1 3  2  '$ Tired, decreased energy 0 '1 3  3  '$ Change in appetite 0 0 2  2  Feeling bad or failure about yourself  0 0 1  0  Trouble concentrating 0 '1 1  2  '$ Moving slowly or fidgety/restless 0 0 1  0  Suicidal thoughts 0 0 0  0  PHQ-9 Score '2 4 15  15  '$ Difficult doing work/chores Somewhat difficult        Allergies  Allergen Reactions   Latex Hives   Altace [Ramipril] Cough   Codeine      Makes sick   Losartan Cough    cough   Remeron [Mirtazapine] Other (See Comments)    Cognitive changes   Tape     Blisters    Zantac [Ranitidine Hcl] Dermatitis   Social History   Social History Narrative   Lives at home with wife. Married to [name].   Retired Production designer, theatre/television/film truck.    Drinks caffeine (3 cups coffee, 2 sodas per day),  Takes a daily vitamin, wears a seatbelt   Wears a bicycle helmet.    Has dentures (partial on top)   Smoke detector in the home. No firearms in the home.    Feels safe in relationships.     Right-handed.   Past Medical History:  Diagnosis Date   Achilles tendinitis of left lower extremity 07/20/2018   Adenomatous colon polyp 2011   Barrett esophagus 12/2013   EGD   CAD (coronary artery disease)    ETT/Lexiscan-Myoview (11/14):  Normal; no ischemia, EF 67%   CKD (chronic kidney disease), stage III (HCC)    Colon polyp 09/2010   adenoma   COPD (chronic obstructive pulmonary disease) ([place]) 2009   spirometry  with "moderate COPD"   Depression    Diabetes mellitus without complication ([place])    Dyspnea    GERD (gastroesophageal reflux disease)    GI bleed    after polypectomy/admitted   Hair loss 08/07/2018   HTN (hypertension)    Hypercholesterolemia    Hypomagnesemia 09/15/2022   Memory loss    Myocardial infarction ([place])    Obesity    Pneumonia 09/15/2022   Sleep apnea    Spontaneous pneumothorax    Synovial cyst of right popliteal space 07/20/2018   Vertigo    Past Surgical History:  Procedure Laterality Date   APPENDECTOMY     CORONARY ANGIOPLASTY WITH STENT PLACEMENT  2007   LAD   PLEURAL SCARIFICATION  1987   TONSILLECTOMY     VARICOCELECTOMY     Family History  Problem Relation Age of Onset   Heart disease Mother    CVA Mother    Dementia Mother    Coronary artery disease Father 31   Heart disease Father    Stroke Father    Dementia Sister    Cancer - Other Brother    Hypertension Other    Hyperlipidemia Other     Colon polyps Sister    Cancer Sister    Allergies as of 12/13/2022       Reactions   Latex Hives   Altace [ramipril] Cough   Codeine    Makes sick   Losartan Cough   cough   Remeron [mirtazapine] Other (See Comments)   Cognitive changes   Tape    Blisters   Zantac [ranitidine Hcl] Dermatitis        Medication List        Accurate as of December 13, 2022 12:04 PM. If you have any questions, ask your nurse or doctor.          Accu-Chek Guide test strip Generic drug: glucose blood 1 each by Other route daily.   amLODipine 5 MG tablet Commonly known as: NORVASC Take 1 tablet (5 mg total) by mouth daily.   atorvastatin 80 MG tablet Commonly known as: LIPITOR Take 1 tablet (80 mg total) by mouth daily.   blood glucose meter kit and supplies Dispense based on patient and insurance preference. Use up to three times daily as directed. (FOR ICD-10 E10.9, E11.9).   buPROPion 300 MG 24 hr tablet Commonly known as: WELLBUTRIN XL Take 1 tablet (300 mg total) by mouth daily.   clopidogrel 75 MG tablet Commonly known as: PLAVIX Take 1 tablet (75 mg total) by mouth daily. Please make overdue appt with Dr. Johnsie Cancel before anymore refills. Thank you 1st attempt   dapagliflozin propanediol 10 MG Tabs tablet Commonly known as: Farxiga Take 1 tablet (10 mg total) by mouth daily before breakfast.   famotidine 20 MG tablet Commonly known as: Pepcid Take 1 tablet (20 mg total) by mouth 2 (two) times daily.   glipiZIDE 5 MG tablet Commonly known as: GLUCOTROL Once daily with first meal of the day. Take with a meal.   iron polysaccharides 150 MG capsule Commonly known as: NIFEREX Take 1 capsule (150 mg total) by mouth daily.   metoprolol tartrate 50 MG tablet Commonly known as: LOPRESSOR Take 1 tablet (50 mg total) by mouth daily.   multivitamin tablet Take 1 tablet by mouth daily.   Myrbetriq 25 MG Tb24 tablet Generic drug: mirabegron ER Take 1 tablet (25 mg total) by  mouth daily.   mirabegron ER 50 MG Tb24 tablet Commonly  known as: Myrbetriq Take 1 tablet (50 mg total) by mouth daily.   nitroGLYCERIN 0.4 MG SL tablet Commonly known as: NITROSTAT Place 1 tablet (0.4 mg total) under the tongue every 5 (five) minutes as needed for chest pain (3 doses max).   OLANZapine 5 MG tablet Commonly known as: ZYPREXA Take 1 tablet (5 mg total) by mouth at bedtime.   pantoprazole 40 MG tablet Commonly known as: PROTONIX Take 1 tablet (40 mg total) by mouth 2 (two) times daily.   PARoxetine 40 MG tablet Commonly known as: PAXIL Take 1 tablet (40 mg total) by mouth daily.   Symbicort 160-4.5 MCG/ACT inhaler Generic drug: budesonide-formoterol Inhale 2 puffs into the lungs 2 (two) times daily.   tamsulosin 0.4 MG Caps capsule Commonly known as: FLOMAX   traZODone 50 MG tablet Commonly known as: DESYREL Take 1 tablet (50 mg total) by mouth at bedtime.   VITAMIN B 12 PO Take by mouth.        All past medical history, surgical history, allergies, family history, immunizations andmedications were updated in the EMR today and reviewed under the history and medication portions of their EMR.     ROS Negative, with the exception of above mentioned in HPI   Objective:  BP 124/83   Pulse 74   Temp 97.9 F (36.6 C)   Wt 205 lb 6.4 oz (93.2 kg)   SpO2 95%   BMI 29.47 kg/m  Body mass index is 29.47 kg/m. Physical Exam Vitals and nursing note reviewed.  Constitutional:      General: He is not in acute distress.    Appearance: Normal appearance. He is not ill-appearing, toxic-appearing or diaphoretic.  HENT:     Head: Normocephalic and atraumatic.  Eyes:     General: No scleral icterus.       Right eye: No discharge.        Left eye: No discharge.     Extraocular Movements: Extraocular movements intact.     Pupils: Pupils are equal, round, and reactive to light.  Cardiovascular:     Rate and Rhythm: Normal rate and regular rhythm.   Pulmonary:     Effort: Pulmonary effort is normal.  Musculoskeletal:        General: Tenderness present. No swelling or signs of injury. Normal range of motion.     Right lower leg: No edema.     Left lower leg: No edema.  Skin:    General: Skin is warm and dry.     Coloration: Skin is not jaundiced or pale.     Findings: No erythema or rash.  Neurological:     Mental Status: He is alert and oriented to person, place, and time. Mental status is at baseline.  Psychiatric:        Mood and Affect: Mood normal.        Behavior: Behavior normal.        Thought Content: Thought content normal.        Judgment: Judgment normal.    No results found. No results found. No results found for this or any previous visit (from the past 24 hour(s)).  Assessment/Plan: BRODY BONNEAU is a 77 y.o. male present for OV for  Iron deficiency anemia due to chronic blood loss Denies any recent bleeding - CBC w/Diff - Iron, TIBC and Ferritin Panel  Polyarthralgia/myalgia We discussed multiple possible causes of his arthralgia.  There is no swelling or erythema.  He reports his grip is  weakened. Will start lab collection today to rule out rhabdo and inflammatory reaction. Recent vitamin D and B12 were normal 3 weeks ago. Patient was encouraged to hydrate well. - Comp Met (CMET) - CK - Sedimentation rate - Urinalysis w microscopic + reflex cultur Further plan discussed once laboratory results received    Reviewed expectations re: course of current medical issues. Discussed self-management of symptoms. Outlined signs and symptoms indicating need for more acute intervention. Patient verbalized understanding and all questions were answered. Patient received an After-Visit Summary.    Orders Placed This Encounter  Procedures   Comp Met (CMET)   CK   CBC w/Diff   Sedimentation rate   Urinalysis w microscopic + reflex cultur   Iron, TIBC and Ferritin Panel   No orders of the defined types  were placed in this encounter.  Referral Orders  No referral(s) requested today     Note is dictated utilizing voice recognition software. Although note has been proof read prior to signing, occasional typographical errors still can be missed. If any questions arise, please do not hesitate to call for verification.   electronically signed by:  Howard Pouch, DO  [place]

## 2022-12-13 NOTE — Patient Instructions (Signed)
Stop glipizide for 1 week. If symptoms improve , discontinue use and let us know.  If symptoms fo not imrprove restart glipizide. Also stop Zyprexa and go back to original trazodone dose for 1 week.  Call us and let us know if wither the above worked or not.

## 2022-12-14 LAB — URINALYSIS W MICROSCOPIC + REFLEX CULTURE
Bacteria, UA: NONE SEEN /HPF
Bilirubin Urine: NEGATIVE
Hgb urine dipstick: NEGATIVE
Hyaline Cast: NONE SEEN /LPF
Ketones, ur: NEGATIVE
Leukocyte Esterase: NEGATIVE
Nitrites, Initial: NEGATIVE
Protein, ur: NEGATIVE
RBC / HPF: NONE SEEN /HPF (ref 0–2)
Specific Gravity, Urine: 1.026 (ref 1.001–1.035)
Squamous Epithelial / HPF: NONE SEEN /HPF (ref <=5)
pH: <=5 (ref 5.0–8.0)

## 2022-12-14 LAB — IRON,TIBC AND FERRITIN PANEL
%SAT: 16 % (calc) — ABNORMAL LOW (ref 20–48)
Ferritin: 28 ng/mL (ref 24–380)
Iron: 53 ug/dL (ref 50–180)
TIBC: 330 mcg/dL (calc) (ref 250–425)

## 2022-12-14 LAB — NO CULTURE INDICATED

## 2022-12-16 ENCOUNTER — Telehealth: Payer: Self-pay | Admitting: Family Medicine

## 2022-12-16 NOTE — Telephone Encounter (Signed)
Spoke with patient regarding results/recommendations.  

## 2022-12-16 NOTE — Telephone Encounter (Signed)
Please call patient Liver function and electrolytes are normal. I would encourage him to increase his hydration, his kidney function is stable from prior collection but he does look a little dehydrated by labs.  His muscle enzyme CK levels are normal. His iron levels are stable. His blood cell counts are stable. His glucose/sugar levels were significantly elevated.  I strongly encouraged him to follow a diabetic diet low in sugar and low in complex carbohydrates.  High and lean meats and vegetables.  He has glucoses in his urine, but this is expected with his level of glucose in  his blood.  Wilder Glade works to remove glucose at the level of the kidneys to the urine, if it is present.  Otherwise, his urine is normal.   The inflammatory marker called sed rate is mildly elevated. Commend he follow the actions we gave him on his AVS about stopping the medications as a trial.  Follow-up in 2 weeks, with provide to discuss.  Depending upon their trial results, will and his elevated sed rate we may need to progress further with autoimmune and arthritis labs.

## 2022-12-23 DIAGNOSIS — H348321 Tributary (branch) retinal vein occlusion, left eye, with retinal neovascularization: Secondary | ICD-10-CM | POA: Diagnosis not present

## 2022-12-29 ENCOUNTER — Other Ambulatory Visit: Payer: Self-pay | Admitting: Cardiovascular Disease

## 2022-12-30 ENCOUNTER — Ambulatory Visit (INDEPENDENT_AMBULATORY_CARE_PROVIDER_SITE_OTHER): Payer: Medicare HMO | Admitting: Family Medicine

## 2022-12-30 VITALS — BP 112/68 | HR 73 | Temp 98.4°F | Wt 203.8 lb

## 2022-12-30 DIAGNOSIS — M2550 Pain in unspecified joint: Secondary | ICD-10-CM

## 2022-12-30 DIAGNOSIS — R252 Cramp and spasm: Secondary | ICD-10-CM | POA: Diagnosis not present

## 2022-12-30 LAB — URIC ACID: Uric Acid, Serum: 4.8 mg/dL (ref 4.0–7.8)

## 2022-12-30 LAB — MAGNESIUM: Magnesium: 1.8 mg/dL (ref 1.5–2.5)

## 2022-12-30 NOTE — Progress Notes (Signed)
[name] , 05-13-1946, 77 y.o., male MRN: [id number] Patient Care Team    Relationship Specialty Notifications Start End  [name], DO PCP - General Family Medicine  12/25/15   Josue Hector, MD PCP - Cardiology Cardiology Admissions 08/20/18   Brunetta Genera, MD Consulting Physician Hematology  09/03/16   Josue Hector, MD Consulting Physician Cardiology  09/03/16   Wilford Corner, MD Consulting Physician Gastroenterology  09/03/16   Melina Schools, OD  Optometry  09/03/16   Kathie Rhodes, MD (Inactive) Consulting Physician Urology  09/10/16   Hollar, Katharine Look, MD Referring Physician Dermatology  06/29/19     Chief Complaint  Patient presents with   Joint Pain     Subjective: [name] is a 77 y.o.-year-old patient presents today for arthralgia follow-up. He reports he did not see much of a difference in his joint ache/pains when trying off glipizide. He did not try holding the zyprexa.  Since pain is not improved he is having troubles sleeping again.   Prior appointment: Pt presents for an OV with complaints of joint pain of about 3 weeks duration.  Patient states it initially started in his knees, but quickly progressed to involve ankles, wrists and shoulders.  He denies any swelling of the joints or redness.  He denies any fevers or headache. We had recently started glipizide and Zyprexa.  He endorses weakness feeling in his hands/wrists and had been unable to remove lid from a jar.  This is new for him. He is prescribed atorvastatin 80 mg, which she has taken for many years without difficulty.     12/04/2022    1:55 PM 11/01/2022    1:15 PM 08/27/2022    3:23 PM 11/28/2021   11:02 AM 09/07/2021   11:06 AM  Depression screen PHQ 2/9  Decreased Interest 1 0 2 0 3  Down, Depressed, Hopeless '1 1 2 1 3  '$ PHQ - 2 Score '2 1 4 1 6  '$ Altered sleeping 0 '1 3  2  '$ Tired, decreased energy 0 '1 3  3  '$ Change in appetite 0 0 2  2  Feeling bad or failure about  yourself  0 0 1  0  Trouble concentrating 0 '1 1  2  '$ Moving slowly or fidgety/restless 0 0 1  0  Suicidal thoughts 0 0 0  0  PHQ-9 Score '2 4 15  15  '$ Difficult doing work/chores Somewhat difficult        Allergies  Allergen Reactions   Latex Hives   Altace [Ramipril] Cough   Codeine     Makes sick   Losartan Cough    cough   Remeron [Mirtazapine] Other (See Comments)    Cognitive changes   Tape     Blisters    Zantac [Ranitidine Hcl] Dermatitis   Social History   Social History Narrative   Lives at home with wife. Married to [name].   Retired Production designer, theatre/television/film truck.    Drinks caffeine (3 cups coffee, 2 sodas per day),  Takes a daily vitamin, wears a seatbelt   Wears a bicycle helmet.    Has dentures (partial on top)   Smoke detector in the home. No firearms in the home.    Feels safe in relationships.     Right-handed.   Past Medical History:  Diagnosis Date   Achilles tendinitis of left lower extremity 07/20/2018   Adenomatous colon polyp 2011   Barrett esophagus 12/2013  EGD   CAD (coronary artery disease)    ETT/Lexiscan-Myoview (11/14):  Normal; no ischemia, EF 67%   CKD (chronic kidney disease), stage III (HCC)    Colon polyp 09/2010   adenoma   COPD (chronic obstructive pulmonary disease) ([place]) 2009   spirometry with "moderate COPD"   Depression    Diabetes mellitus without complication (HCC)    Dyspnea    GERD (gastroesophageal reflux disease)    GI bleed    after polypectomy/admitted   Hair loss 08/07/2018   HTN (hypertension)    Hypercholesterolemia    Hypomagnesemia 09/15/2022   Memory loss    Myocardial infarction ([place])    Obesity    Pneumonia 09/15/2022   Sleep apnea    Spontaneous pneumothorax    Synovial cyst of right popliteal space 07/20/2018   Vertigo    Past Surgical History:  Procedure Laterality Date   APPENDECTOMY     CORONARY ANGIOPLASTY WITH STENT PLACEMENT  2007   LAD   PLEURAL SCARIFICATION  1987   TONSILLECTOMY     VARICOCELECTOMY      Family History  Problem Relation Age of Onset   Heart disease Mother    CVA Mother    Dementia Mother    Coronary artery disease Father 17   Heart disease Father    Stroke Father    Dementia Sister    Cancer - Other Brother    Hypertension Other    Hyperlipidemia Other    Colon polyps Sister    Cancer Sister    Allergies as of 12/30/2022       Reactions   Latex Hives   Altace [ramipril] Cough   Codeine    Makes sick   Losartan Cough   cough   Remeron [mirtazapine] Other (See Comments)   Cognitive changes   Tape    Blisters   Zantac [ranitidine Hcl] Dermatitis        Medication List        Accurate as of December 30, 2022  2:32 PM. If you have any questions, ask your nurse or doctor.          Accu-Chek Guide test strip Generic drug: glucose blood 1 each by Other route daily.   amLODipine 5 MG tablet Commonly known as: NORVASC Take 1 tablet (5 mg total) by mouth daily.   atorvastatin 80 MG tablet Commonly known as: LIPITOR Take 1 tablet (80 mg total) by mouth daily.   blood glucose meter kit and supplies Dispense based on patient and insurance preference. Use up to three times daily as directed. (FOR ICD-10 E10.9, E11.9).   buPROPion 300 MG 24 hr tablet Commonly known as: WELLBUTRIN XL Take 1 tablet (300 mg total) by mouth daily.   clopidogrel 75 MG tablet Commonly known as: PLAVIX Take 1 tablet (75 mg total) by mouth daily.   dapagliflozin propanediol 10 MG Tabs tablet Commonly known as: Farxiga Take 1 tablet (10 mg total) by mouth daily before breakfast.   famotidine 20 MG tablet Commonly known as: Pepcid Take 1 tablet (20 mg total) by mouth 2 (two) times daily.   glipiZIDE 5 MG tablet Commonly known as: GLUCOTROL Once daily with first meal of the day. Take with a meal.   iron polysaccharides 150 MG capsule Commonly known as: NIFEREX Take 1 capsule (150 mg total) by mouth daily.   metoprolol tartrate 50 MG tablet Commonly known as:  LOPRESSOR Take 1 tablet (50 mg total) by mouth daily.   multivitamin tablet  Take 1 tablet by mouth daily.   Myrbetriq 25 MG Tb24 tablet Generic drug: mirabegron ER Take 1 tablet (25 mg total) by mouth daily.   mirabegron ER 50 MG Tb24 tablet Commonly known as: Myrbetriq Take 1 tablet (50 mg total) by mouth daily.   nitroGLYCERIN 0.4 MG SL tablet Commonly known as: NITROSTAT Place 1 tablet (0.4 mg total) under the tongue every 5 (five) minutes as needed for chest pain (3 doses max).   OLANZapine 5 MG tablet Commonly known as: ZYPREXA Take 1 tablet (5 mg total) by mouth at bedtime.   pantoprazole 40 MG tablet Commonly known as: PROTONIX Take 1 tablet (40 mg total) by mouth 2 (two) times daily.   PARoxetine 40 MG tablet Commonly known as: PAXIL Take 1 tablet (40 mg total) by mouth daily.   Symbicort 160-4.5 MCG/ACT inhaler Generic drug: budesonide-formoterol Inhale 2 puffs into the lungs 2 (two) times daily.   tamsulosin 0.4 MG Caps capsule Commonly known as: FLOMAX   traZODone 50 MG tablet Commonly known as: DESYREL Take 1 tablet (50 mg total) by mouth at bedtime.   VITAMIN B 12 PO Take by mouth.        All past medical history, surgical history, allergies, family history, immunizations andmedications were updated in the EMR today and reviewed under the history and medication portions of their EMR.     ROS Negative, with the exception of above mentioned in HPI   Objective:  BP 112/68   Pulse 73   Temp 98.4 F (36.9 C)   Wt 203 lb 12.8 oz (92.4 kg)   SpO2 96%   BMI 29.24 kg/m  Body mass index is 29.24 kg/m. Physical Exam Vitals and nursing note reviewed.  Constitutional:      General: He is not in acute distress.    Appearance: Normal appearance. He is not ill-appearing, toxic-appearing or diaphoretic.  HENT:     Head: Normocephalic and atraumatic.  Eyes:     General: No scleral icterus.       Right eye: No discharge.        Left eye: No  discharge.     Extraocular Movements: Extraocular movements intact.     Pupils: Pupils are equal, round, and reactive to light.  Cardiovascular:     Rate and Rhythm: Normal rate and regular rhythm.  Pulmonary:     Effort: Pulmonary effort is normal. No respiratory distress.     Breath sounds: Normal breath sounds. No wheezing, rhonchi or rales.  Musculoskeletal:     Comments: Extremely weak grip strength on exam.   Skin:    General: Skin is warm and dry.     Coloration: Skin is not jaundiced or pale.     Findings: No rash.  Neurological:     Mental Status: He is alert and oriented to person, place, and time. Mental status is at baseline.  Psychiatric:        Mood and Affect: Mood normal.        Behavior: Behavior normal.        Thought Content: Thought content normal.        Judgment: Judgment normal.    No results found. No results found. No results found for this or any previous visit (from the past 24 hour(s)).  Assessment/Plan: [name] is a 77 y.o. male present for OV for  Iron deficiency anemia due to chronic blood loss Denies any recent bleeding - CBC w/Diff-stable - Iron, TIBC and Ferritin  Panel-stable Can start [place] with iron.  Polyarthralgia/myalgia We discussed multiple possible causes of his arthralgia.  There is no swelling or erythema.   Grip strength is certainly weak bilaterally on exam.  Recent vitamin D and B12 - normal He is not taking the magnesium bc of SE> recheck mag and may need to try a different format.  Patient was encouraged to hydrate well. - Comp Met (CMET) creatinine 47.6, glucose was high - CK> normal at 44 - Sedimentation rate-mildly elevated at 30 -Iron panel was stable - Urinalysis w microscopic + reflex cultur> WNL - magnesium, ANA and uric acid collected today.  Pain: tylenol for now. He can take codeine and would  require vicoden.  May need referral to neuro or rheum, depending on results.  After results received will  guide on trazodone/zyprexa dose. May need to dc zyprexa if cause of symptoms. If not the cause would want to increase zyprexa and dc traz.vs dc both and add seroquel tapering.    Reviewed expectations re: course of current medical issues. Discussed self-management of symptoms. Outlined signs and symptoms indicating need for more acute intervention. Patient verbalized understanding and all questions were answered. Patient received an After-Visit Summary.    Orders Placed This Encounter  Procedures   Uric acid   ANA, IFA Comprehensive Panel-([facility])   Rheumatoid Factor   Cyclic citrul peptide antibody, IgG ([facility])   Magnesium   No orders of the defined types were placed in this encounter.  Referral Orders  No referral(s) requested today     Note is dictated utilizing voice recognition software. Although note has been proof read prior to signing, occasional typographical errors still can be missed. If any questions arise, please do not hesitate to call for verification.   electronically signed by:  Howard Pouch, DO  [place]

## 2023-01-01 ENCOUNTER — Telehealth: Payer: Self-pay | Admitting: Family Medicine

## 2023-01-01 LAB — ANA, IFA COMPREHENSIVE PANEL
Anti Nuclear Antibody (ANA): POSITIVE — AB
ENA SM Ab Ser-aCnc: <1 AI
SM/RNP: <1 AI
SSA (Ro) (ENA) Antibody, IgG: <1 AI
SSB (La) (ENA) Antibody, IgG: <1 AI
Scleroderma (Scl-70) (ENA) Antibody, IgG: <1 AI
ds DNA Ab: <1 IU/mL

## 2023-01-01 LAB — ANTI-NUCLEAR AB-TITER (ANA TITER): ANA Titer 1: 1:40 {titer} — ABNORMAL HIGH

## 2023-01-01 LAB — RHEUMATOID FACTOR: Rheumatoid fact SerPl-aCnc: <14 IU/mL (ref <14)

## 2023-01-01 LAB — CYCLIC CITRUL PEPTIDE ANTIBODY, IGG: Cyclic Citrullin Peptide Ab: <16 UNITS

## 2023-01-01 NOTE — Telephone Encounter (Signed)
Please inform patient Uric acid, rheumatoid factor, CCP are all normal. The autoimmune panel returned normal.   ANA was positive, ANA can be positive in normal patients.  It is the reflex panel that follows that carries the information required if the ANA is positive.  His reflex panel is normal with only a very mild elevation of the titer 1: 40.  This titer does not carry any diagnostic weight, titers have to be greater than 1: 160 to be considered true positive.  His magnesium is dropping back down and is on the low end of normal.  He would benefit from starting a magnesium supplement.  These ask them what magnesium supplement caused him GI upset so I can call in something different for him.  Concerning his pain, I would like to reevaluate his pain after we start him on the magnesium and make changes to his olanzapine/Zyprexa.  We need to rule out the symptoms are not coming from the olanzapine which is possible.  Therefore, stop olanzapine.  Return to taking trazodone at 200 mg before bed.  If he still has the 50 mg tabs of trazodone that is 4 tabs before bed.  Follow-up in 4 weeks after making the above changes.

## 2023-01-02 ENCOUNTER — Telehealth: Payer: Self-pay | Admitting: Family Medicine

## 2023-01-02 NOTE — Telephone Encounter (Addendum)
Spoke with patient regarding results/recommendations. The one he was previously taking is Magnesium Oxide [place] brand extra strength '400mg'$ .    Pt's wife called and wanted to note the magnesium that was hurting [place] stomach and to know if there is another he should begin to take. The one he was previously taking is Magnesium Oxide [place] brand extra strength '400mg'$ . They would also like to know if he should restart Glipizide?. Please give them a call at [phone]

## 2023-01-02 NOTE — Telephone Encounter (Signed)
Pt's wife called and wanted to note the magnesium that was hurting [name] stomach and to know if there is another he should begin to take. The one he was previously taking is Magnesium Oxide [place] brand extra strength '400mg'$ . They would also like to know if he should restart Glipizide?. Please give them a call at [phone]

## 2023-01-02 NOTE — Telephone Encounter (Signed)
Spoke with patient regarding results/recommendations.  

## 2023-01-02 NOTE — Telephone Encounter (Signed)
LM with wife for pt to return call to discuss.

## 2023-01-03 MED ORDER — MAGNESIUM CHLORIDE 64 MG PO TBEC: 1.0000 | DELAYED_RELEASE_TABLET | Freq: Two times a day (BID) | ORAL | 5 refills | Status: DC

## 2023-01-03 NOTE — Telephone Encounter (Signed)
I have called in Slow Mag, 1 tab twice a day.

## 2023-01-03 NOTE — Telephone Encounter (Signed)
Spoke with patient regarding results/recommendations.  

## 2023-01-03 NOTE — Addendum Note (Signed)
Addended by: Howard Pouch A on: 01/03/2023 03:06 PM   Modules accepted: Orders

## 2023-01-06 NOTE — Telephone Encounter (Signed)
Wife would like to know if he should restart Glipizide?

## 2023-01-06 NOTE — Telephone Encounter (Signed)
Spoke with patient regarding results/recommendations.  

## 2023-01-06 NOTE — Telephone Encounter (Signed)
See other encounter

## 2023-01-06 NOTE — Telephone Encounter (Signed)
Pt never received answer or response to if restarting Glipizide was suggested by Dr. Raoul Pitch. Please contact patients wife or the patient directly regarding this matter.

## 2023-01-06 NOTE — Telephone Encounter (Signed)
Yes.  I had told him to restart glipizide during their appointment.

## 2023-01-31 ENCOUNTER — Encounter: Payer: Self-pay | Admitting: Family Medicine

## 2023-01-31 ENCOUNTER — Ambulatory Visit (INDEPENDENT_AMBULATORY_CARE_PROVIDER_SITE_OTHER): Payer: Medicare HMO | Admitting: Family Medicine

## 2023-01-31 VITALS — BP 131/78 | HR 63 | Temp 98.7°F | Wt 204.0 lb

## 2023-01-31 DIAGNOSIS — M2550 Pain in unspecified joint: Secondary | ICD-10-CM

## 2023-01-31 DIAGNOSIS — R252 Cramp and spasm: Secondary | ICD-10-CM | POA: Diagnosis not present

## 2023-01-31 NOTE — Progress Notes (Signed)
[name] , 1946/09/27, 77 y.o., male MRN: [id number] Patient Care Team    Relationship Specialty Notifications Start End  [name], DO PCP - General Family Medicine  12/25/15   Josue Hector, MD PCP - Cardiology Cardiology Admissions 08/20/18   Brunetta Genera, MD Consulting Physician Hematology  09/03/16   Josue Hector, MD Consulting Physician Cardiology  09/03/16   Wilford Corner, MD Consulting Physician Gastroenterology  09/03/16   Kathie Rhodes, MD (Inactive) Consulting Physician Urology  09/10/16   Hollar, Katharine Look, MD Referring Physician Dermatology  06/29/19   Otelia Sergeant, OD Referring Physician   01/31/23     Chief Complaint  Patient presents with   Joint Pain     Subjective: [name] is a 77 y.o.-year-old patient presents today for arthralgia follow-up.  Patient is present with his wife today. They report he is feeling better. He has not started the [place] vitamin. He is tolerating the magnesium supplement.He has more energy and less pain.  He stopped the zyprexa and started the higher dose trazodone 200 mg QHS. He is still not sleeping great, but ok for him.   Prior note: He reports he did not see much of a difference in his joint ache/pains when trying off glipizide. He did not try holding the zyprexa.  Since pain is not improved he is having troubles sleeping again.   Prior appointment: Pt presents for an OV with complaints of joint pain of about 3 weeks duration.  Patient states it initially started in his knees, but quickly progressed to involve ankles, wrists and shoulders.  He denies any swelling of the joints or redness.  He denies any fevers or headache. We had recently started glipizide and Zyprexa.  He endorses weakness feeling in his hands/wrists and had been unable to remove lid from a jar.  This is new for him. He is prescribed atorvastatin 80 mg, which she has taken for many years without difficulty.     12/04/2022     1:55 PM 11/01/2022    1:15 PM 08/27/2022    3:23 PM 11/28/2021   11:02 AM 09/07/2021   11:06 AM  Depression screen PHQ 2/9  Decreased Interest 1 0 2 0 3  Down, Depressed, Hopeless '1 1 2 1 3  '$ PHQ - 2 Score '2 1 4 1 6  '$ Altered sleeping 0 '1 3  2  '$ Tired, decreased energy 0 '1 3  3  '$ Change in appetite 0 0 2  2  Feeling bad or failure about yourself  0 0 1  0  Trouble concentrating 0 '1 1  2  '$ Moving slowly or fidgety/restless 0 0 1  0  Suicidal thoughts 0 0 0  0  PHQ-9 Score '2 4 15  15  '$ Difficult doing work/chores Somewhat difficult        Allergies  Allergen Reactions   Latex Hives   Altace [Ramipril] Cough   Codeine     Makes sick   Losartan Cough    cough   Remeron [Mirtazapine] Other (See Comments)    Cognitive changes   Tape     Blisters    Zantac [Ranitidine Hcl] Dermatitis   Social History   Social History Narrative   Lives at home with wife. Married to [name].   Retired Production designer, theatre/television/film truck.    Drinks caffeine (3 cups coffee, 2 sodas per day),  Takes a daily vitamin, wears a seatbelt   Wears a bicycle  helmet.    Has dentures (partial on top)   Smoke detector in the home. No firearms in the home.    Feels safe in relationships.     Right-handed.   Past Medical History:  Diagnosis Date   Achilles tendinitis of left lower extremity 07/20/2018   Adenomatous colon polyp 2011   Barrett esophagus 12/2013   EGD   CAD (coronary artery disease)    ETT/Lexiscan-Myoview (11/14):  Normal; no ischemia, EF 67%   CKD (chronic kidney disease), stage III (HCC)    Colon polyp 09/2010   adenoma   COPD (chronic obstructive pulmonary disease) ([place]) 2009   spirometry with "moderate COPD"   Depression    Diabetes mellitus without complication (HCC)    Dyspnea    GERD (gastroesophageal reflux disease)    GI bleed    after polypectomy/admitted   Hair loss 08/07/2018   HTN (hypertension)    Hypercholesterolemia    Hypomagnesemia 09/15/2022   Memory loss    Myocardial infarction ([place])     Obesity    Pneumonia 09/15/2022   Sleep apnea    Spontaneous pneumothorax    Synovial cyst of right popliteal space 07/20/2018   Vertigo    Past Surgical History:  Procedure Laterality Date   APPENDECTOMY     CORONARY ANGIOPLASTY WITH STENT PLACEMENT  2007   LAD   PLEURAL SCARIFICATION  1987   TONSILLECTOMY     VARICOCELECTOMY     Family History  Problem Relation Age of Onset   Heart disease Mother    CVA Mother    Dementia Mother    Coronary artery disease Father 32   Heart disease Father    Stroke Father    Dementia Sister    Cancer - Other Brother    Hypertension Other    Hyperlipidemia Other    Colon polyps Sister    Cancer Sister    Allergies as of 01/31/2023       Reactions   Latex Hives   Altace [ramipril] Cough   Codeine    Makes sick   Losartan Cough   cough   Remeron [mirtazapine] Other (See Comments)   Cognitive changes   Tape    Blisters   Zantac [ranitidine Hcl] Dermatitis        Medication List        Accurate as of January 31, 2023  1:27 PM. If you have any questions, ask your nurse or doctor.          Accu-Chek Guide test strip Generic drug: glucose blood 1 each by Other route daily.   amLODipine 5 MG tablet Commonly known as: NORVASC Take 1 tablet (5 mg total) by mouth daily.   atorvastatin 80 MG tablet Commonly known as: LIPITOR Take 1 tablet (80 mg total) by mouth daily.   blood glucose meter kit and supplies Dispense based on patient and insurance preference. Use up to three times daily as directed. (FOR ICD-10 E10.9, E11.9).   buPROPion 300 MG 24 hr tablet Commonly known as: WELLBUTRIN XL Take 1 tablet (300 mg total) by mouth daily.   clopidogrel 75 MG tablet Commonly known as: PLAVIX Take 1 tablet (75 mg total) by mouth daily.   dapagliflozin propanediol 10 MG Tabs tablet Commonly known as: Farxiga Take 1 tablet (10 mg total) by mouth daily before breakfast.   famotidine 20 MG tablet Commonly known as:  Pepcid Take 1 tablet (20 mg total) by mouth 2 (two) times daily.   glipiZIDE  5 MG tablet Commonly known as: GLUCOTROL Once daily with first meal of the day. Take with a meal.   iron polysaccharides 150 MG capsule Commonly known as: NIFEREX Take 1 capsule (150 mg total) by mouth daily.   magnesium chloride 64 MG Tbec SR tablet Commonly known as: SLOW-MAG Take 1 tablet (64 mg total) by mouth 2 (two) times daily.   metoprolol tartrate 50 MG tablet Commonly known as: LOPRESSOR Take 1 tablet (50 mg total) by mouth daily.   multivitamin tablet Take 1 tablet by mouth daily.   Myrbetriq 25 MG Tb24 tablet Generic drug: mirabegron ER Take 1 tablet (25 mg total) by mouth daily.   mirabegron ER 50 MG Tb24 tablet Commonly known as: Myrbetriq Take 1 tablet (50 mg total) by mouth daily.   nitroGLYCERIN 0.4 MG SL tablet Commonly known as: NITROSTAT Place 1 tablet (0.4 mg total) under the tongue every 5 (five) minutes as needed for chest pain (3 doses max).   pantoprazole 40 MG tablet Commonly known as: PROTONIX Take 1 tablet (40 mg total) by mouth 2 (two) times daily.   PARoxetine 40 MG tablet Commonly known as: PAXIL Take 1 tablet (40 mg total) by mouth daily.   Symbicort 160-4.5 MCG/ACT inhaler Generic drug: budesonide-formoterol Inhale 2 puffs into the lungs 2 (two) times daily.   tamsulosin 0.4 MG Caps capsule Commonly known as: FLOMAX   traZODone 50 MG tablet Commonly known as: DESYREL Take 1 tablet (50 mg total) by mouth at bedtime.   VITAMIN B 12 PO Take by mouth.        All past medical history, surgical history, allergies, family history, immunizations andmedications were updated in the EMR today and reviewed under the history and medication portions of their EMR.     ROS Negative, with the exception of above mentioned in HPI   Objective:  BP 131/78   Pulse 63   Temp 98.7 F (37.1 C)   Wt 204 lb (92.5 kg)   SpO2 94%   BMI 29.27 kg/m  Body mass index  is 29.27 kg/m. Physical Exam Vitals and nursing note reviewed.  Constitutional:      General: He is not in acute distress.    Appearance: Normal appearance. He is not ill-appearing, toxic-appearing or diaphoretic.     Comments: Seems more energetic today  HENT:     Head: Normocephalic and atraumatic.  Eyes:     General: No scleral icterus.       Right eye: No discharge.        Left eye: No discharge.     Extraocular Movements: Extraocular movements intact.     Pupils: Pupils are equal, round, and reactive to light.  Cardiovascular:     Rate and Rhythm: Normal rate and regular rhythm.  Pulmonary:     Effort: Pulmonary effort is normal. No respiratory distress.     Breath sounds: Normal breath sounds. No wheezing, rhonchi or rales.  Musculoskeletal:     Comments: grip strength improved   Skin:    General: Skin is warm and dry.     Coloration: Skin is not jaundiced or pale.     Findings: No rash.  Neurological:     Mental Status: He is alert and oriented to person, place, and time. Mental status is at baseline.  Psychiatric:        Mood and Affect: Mood normal.        Behavior: Behavior normal.        Thought  Content: Thought content normal.        Judgment: Judgment normal.    No results found. No results found. No results found for this or any previous visit (from the past 24 hour(s)).  Assessment/Plan: [name] is a 77 y.o. male present for OV for  Iron deficiency anemia due to chronic blood loss Denies any recent bleeding - CBC w/Diff-stable - Iron, TIBC and Ferritin Panel-stable Can start [place] with iron.(Does not tolerate other iron supplement) Insomnia: No longer on zyprexa, will try to maximize trazodone if BMP today is stable.  Polyarthralgia/myalgia We discussed multiple possible causes of his arthralgia and weakness.He is seeing improvement since starting magnesium.  Tolerating mag BID. Feeling better. Strength better Patient was encouraged to  hydrate well. - Comp Met (CMET) creatinine 1.44 - CK> normal at 44 - Recent vitamin D and B12 - normal Sedimentation rate-mildly elevated at 30 -Iron panel was stable - Urinalysis w microscopic + reflex cultur> WNL - magnesium> LOW added supplement> collected today - ANA w/ panel, ccp, RF and uric acid WNL Pain: tylenol for now. He can not take codeine and would  require vicoden.     Reviewed expectations re: course of current medical issues. Discussed self-management of symptoms. Outlined signs and symptoms indicating need for more acute intervention. Patient verbalized understanding and all questions were answered. Patient received an After-Visit Summary.    Orders Placed This Encounter  Procedures   Magnesium   Basic Metabolic Panel (BMET)   No orders of the defined types were placed in this encounter.  Referral Orders  No referral(s) requested today     Note is dictated utilizing voice recognition software. Although note has been proof read prior to signing, occasional typographical errors still can be missed. If any questions arise, please do not hesitate to call for verification.   electronically signed by:  Howard Pouch, DO  [place]

## 2023-02-01 LAB — BASIC METABOLIC PANEL
BUN/Creatinine Ratio: 13 (calc) (ref 6–22)
BUN: 19 mg/dL (ref 7–25)
CO2: 25 mmol/L (ref 20–32)
Calcium: 9.8 mg/dL (ref 8.6–10.3)
Chloride: 99 mmol/L (ref 98–110)
Creat: 1.52 mg/dL — ABNORMAL HIGH (ref 0.70–1.28)
Glucose, Bld: 251 mg/dL — ABNORMAL HIGH (ref 65–99)
Potassium: 4.5 mmol/L (ref 3.5–5.3)
Sodium: 137 mmol/L (ref 135–146)

## 2023-02-01 LAB — MAGNESIUM: Magnesium: 2 mg/dL (ref 1.5–2.5)

## 2023-02-05 DIAGNOSIS — H348322 Tributary (branch) retinal vein occlusion, left eye, stable: Secondary | ICD-10-CM | POA: Diagnosis not present

## 2023-02-05 DIAGNOSIS — E119 Type 2 diabetes mellitus without complications: Secondary | ICD-10-CM | POA: Diagnosis not present

## 2023-02-05 DIAGNOSIS — H2513 Age-related nuclear cataract, bilateral: Secondary | ICD-10-CM | POA: Diagnosis not present

## 2023-02-14 ENCOUNTER — Encounter: Payer: Self-pay | Admitting: Family Medicine

## 2023-02-14 ENCOUNTER — Ambulatory Visit (INDEPENDENT_AMBULATORY_CARE_PROVIDER_SITE_OTHER): Payer: Medicare HMO | Admitting: Family Medicine

## 2023-02-14 VITALS — BP 111/69 | HR 65 | Temp 97.9°F | Wt 203.8 lb

## 2023-02-14 DIAGNOSIS — E1169 Type 2 diabetes mellitus with other specified complication: Secondary | ICD-10-CM

## 2023-02-14 DIAGNOSIS — F5101 Primary insomnia: Secondary | ICD-10-CM

## 2023-02-14 DIAGNOSIS — I2510 Atherosclerotic heart disease of native coronary artery without angina pectoris: Secondary | ICD-10-CM

## 2023-02-14 DIAGNOSIS — Z5181 Encounter for therapeutic drug level monitoring: Secondary | ICD-10-CM | POA: Diagnosis not present

## 2023-02-14 DIAGNOSIS — F3341 Major depressive disorder, recurrent, in partial remission: Secondary | ICD-10-CM | POA: Diagnosis not present

## 2023-02-14 DIAGNOSIS — E785 Hyperlipidemia, unspecified: Secondary | ICD-10-CM | POA: Diagnosis not present

## 2023-02-14 DIAGNOSIS — I6782 Cerebral ischemia: Secondary | ICD-10-CM

## 2023-02-14 DIAGNOSIS — G319 Degenerative disease of nervous system, unspecified: Secondary | ICD-10-CM

## 2023-02-14 DIAGNOSIS — K2100 Gastro-esophageal reflux disease with esophagitis, without bleeding: Secondary | ICD-10-CM | POA: Diagnosis not present

## 2023-02-14 DIAGNOSIS — D6869 Other thrombophilia: Secondary | ICD-10-CM

## 2023-02-14 DIAGNOSIS — Z79899 Other long term (current) drug therapy: Secondary | ICD-10-CM

## 2023-02-14 DIAGNOSIS — R972 Elevated prostate specific antigen [PSA]: Secondary | ICD-10-CM

## 2023-02-14 DIAGNOSIS — D500 Iron deficiency anemia secondary to blood loss (chronic): Secondary | ICD-10-CM

## 2023-02-14 DIAGNOSIS — N138 Other obstructive and reflux uropathy: Secondary | ICD-10-CM

## 2023-02-14 DIAGNOSIS — I252 Old myocardial infarction: Secondary | ICD-10-CM | POA: Diagnosis not present

## 2023-02-14 DIAGNOSIS — F330 Major depressive disorder, recurrent, mild: Secondary | ICD-10-CM

## 2023-02-14 DIAGNOSIS — N401 Enlarged prostate with lower urinary tract symptoms: Secondary | ICD-10-CM

## 2023-02-14 DIAGNOSIS — I10 Essential (primary) hypertension: Secondary | ICD-10-CM

## 2023-02-14 DIAGNOSIS — J4489 Other specified chronic obstructive pulmonary disease: Secondary | ICD-10-CM | POA: Diagnosis not present

## 2023-02-14 DIAGNOSIS — N183 Chronic kidney disease, stage 3 unspecified: Secondary | ICD-10-CM | POA: Diagnosis not present

## 2023-02-14 DIAGNOSIS — E1122 Type 2 diabetes mellitus with diabetic chronic kidney disease: Secondary | ICD-10-CM | POA: Diagnosis not present

## 2023-02-14 DIAGNOSIS — I679 Cerebrovascular disease, unspecified: Secondary | ICD-10-CM

## 2023-02-14 LAB — POCT GLYCOSYLATED HEMOGLOBIN (HGB A1C)
HbA1c POC (<> result, manual entry): 9.3 % (ref 4.0–5.6)
HbA1c, POC (controlled diabetic range): 9.3 % — AB (ref 0.0–7.0)
HbA1c, POC (prediabetic range): 9.3 % — AB (ref 5.7–6.4)
Hemoglobin A1C: 9.3 % — AB (ref 4.0–5.6)

## 2023-02-14 MED ORDER — ATORVASTATIN CALCIUM 80 MG PO TABS: 80.0000 mg | ORAL_TABLET | Freq: Every day | ORAL | 3 refills | Status: DC

## 2023-02-14 MED ORDER — BUPROPION HCL ER (XL) 300 MG PO TB24: 300.0000 mg | ORAL_TABLET | Freq: Every day | ORAL | 1 refills | Status: DC

## 2023-02-14 MED ORDER — PANTOPRAZOLE SODIUM 40 MG PO TBEC: 40.0000 mg | DELAYED_RELEASE_TABLET | Freq: Two times a day (BID) | ORAL | 3 refills | Status: DC

## 2023-02-14 MED ORDER — METOPROLOL TARTRATE 50 MG PO TABS: 50.0000 mg | ORAL_TABLET | Freq: Every day | ORAL | 1 refills | Status: DC

## 2023-02-14 MED ORDER — DAPAGLIFLOZIN PROPANEDIOL 10 MG PO TABS: 10.0000 mg | ORAL_TABLET | Freq: Every day | ORAL | 5 refills | Status: DC

## 2023-02-14 MED ORDER — GLIPIZIDE 10 MG PO TABS: ORAL_TABLET | ORAL | 1 refills | Status: DC

## 2023-02-14 MED ORDER — AMLODIPINE BESYLATE 5 MG PO TABS: 5.0000 mg | ORAL_TABLET | Freq: Every day | ORAL | 1 refills | Status: DC

## 2023-02-14 MED ORDER — TRAZODONE HCL 300 MG PO TABS: 300.0000 mg | ORAL_TABLET | Freq: Every day | ORAL | 1 refills | Status: DC

## 2023-02-14 MED ORDER — PAROXETINE HCL 40 MG PO TABS: 40.0000 mg | ORAL_TABLET | Freq: Every day | ORAL | 1 refills | Status: DC

## 2023-02-14 NOTE — Patient Instructions (Addendum)
Increase trazodone to 300 mg a night.  Increase glipizide to 5 mg with breakfast and 10 mg with dinner.   Magnesium: Seeds: Pumpkin (156 mg) or chia seeds (111 mg) Nuts: Almonds (80 mg) and cashews (74 mg) Greens: Spinach (78 mg) Beans: Black beans (60 mg) Soy products: edamame (50 mg) Protein: Peanut butter, smooth (49 mg) Seafood: Salmon (26 mg) Dairy products: Yogurt (42 mg) and milk (24-27 mg) Fruits: Avocados (22 mg) and bananas (32 mg)   Return in about 4 months (around 06/09/2023) for Routine chronic condition follow-up.        Great to see you today.  I have refilled the medication(s) we provide.   If labs were collected, we will inform you of lab results once received either by echart message or telephone call.   - echart message- for normal results that have been seen by the patient already.   - telephone call: abnormal results or if patient has not viewed results in their echart.

## 2023-02-14 NOTE — Progress Notes (Signed)
[name] , 02-May-1946, 77 y.o., male MRN: [id number] Patient Care Team    Relationship Specialty Notifications Start End  [name], DO PCP - General Family Medicine  12/25/15   Josue Hector, MD PCP - Cardiology Cardiology Admissions 08/20/18   Brunetta Genera, MD Consulting Physician Hematology  09/03/16   Josue Hector, MD Consulting Physician Cardiology  09/03/16   Wilford Corner, MD Consulting Physician Gastroenterology  09/03/16   Kathie Rhodes, MD (Inactive) Consulting Physician Urology  09/10/16   Hollar, Katharine Look, MD Referring Physician Dermatology  06/29/19   Otelia Sergeant, OD Referring Physician   01/31/23     Chief Complaint  Patient presents with   Diabetes    [facility]     Subjective: Pt presents for an OV for follow-up on changes to his medications last month Recurrent major depressive disorder, in partial remission (HCC)/memory changes He reports compliance with trazodone 200 mg nightly, Paxil 40 mg daily and Wellbutrin 300 mg daily. Zyprexa discontinued secondary to arthralgias. Prior note: Patient is present with his wife today. They report he is feeling better. He has not started the [place] vitamin. He is tolerating the magnesium supplement.He has more energy and less pain.  He stopped the zyprexa and started the higher dose trazodone 200 mg QHS. He is still not sleeping great, but ok for him.    Gastroesophageal reflux disease, esophagitis presence not specified/diarrhea/bloating  EGD 03/2016 by Dr. Michail Sermon with signs of barrett's esophagus. Patient was tried on lower dose medication, and was unable to tolerate secondary to return of symptoms.   His symptoms are well-controlled with protonix 40 BID and pepcid BID.    COPD [facility]) He uses Symbicort daily and feels it is working well for him  Atherosclerosis of native coronary artery of native heart without angina pectoris/Essential hypertension/HYPERCHOLESTEROLEMIA/History of MI  (myocardial infarction)/IDA He has known CAD with prior LAD stent from 2007. Pt reports compliance with metoprolol 50 mg daily and amlodipine 5 mg daily Qd, losartan 100 mg daily.  Patient denies chest pain, shortness of breath, dizziness or lower extremity edema.  Pt takes a daily Plavix. Pt is  prescribed statin and followed by Dr. Johnsie Cancel Cardiology.  Diet: Low-sodium RF: Hypertension, hyperlipidemia, diabetes  Type 2 diabetes mellitus without complication, without long-term current use of insulin ([place]) patient reports compliance with Farxiga 10 mg daily and glipizide 5 mg daily.   He had diarrhea  off metformin trial as well.  We discontinued metformin regardless, to not cause the potential of worsening diarrhea.   Patient denies dizziness, hyperglycemic or hypoglycemic events. Patient denies numbness, tingling in the extremities or nonhealing wounds of feet.      01/31/2023    2:25 PM 12/04/2022    1:55 PM 11/01/2022    1:15 PM 08/27/2022    3:23 PM 11/28/2021   11:02 AM  Depression screen PHQ 2/9  Decreased Interest 0 1 0 2 0  Down, Depressed, Hopeless 0 1 1 2 1   PHQ - 2 Score 0 2 1 4 1   Altered sleeping  0 1 3   Tired, decreased energy  0 1 3   Change in appetite  0 0 2   Feeling bad or failure about yourself   0 0 1   Trouble concentrating  0 1 1   Moving slowly or fidgety/restless  0 0 1   Suicidal thoughts  0 0 0   PHQ-9 Score  2 4 15  Difficult doing work/chores  Somewhat difficult         11/01/2022    1:15 PM 08/27/2022    3:24 PM 09/07/2021   11:07 AM 03/08/2021   11:05 AM  GAD 7 : Generalized Anxiety Score  Nervous, Anxious, on Edge 0 0 2 3  Control/stop worrying 1 1 2 3   Worry too much - different things 1 2 2 2   Trouble relaxing 1 2 1 1   Restless 1 2 1 1   Easily annoyed or irritable 1 2 1 3   Afraid - awful might happen 0 0 0 0  Total GAD 7 Score 5 9 9 13    Allergies  Allergen Reactions   Latex Hives   Altace [Ramipril] Cough   Codeine     Makes sick    Losartan Cough    cough   Remeron [Mirtazapine] Other (See Comments)    Cognitive changes   Tape     Blisters    Zantac [Ranitidine Hcl] Dermatitis   Social History   Social History Narrative   Lives at home with wife. Married to [name].   Retired Production designer, theatre/television/film truck.    Drinks caffeine (3 cups coffee, 2 sodas per day),  Takes a daily vitamin, wears a seatbelt   Wears a bicycle helmet.    Has dentures (partial on top)   Smoke detector in the home. No firearms in the home.    Feels safe in relationships.     Right-handed.   Past Medical History:  Diagnosis Date   Achilles tendinitis of left lower extremity 07/20/2018   Adenomatous colon polyp 2011   Barrett esophagus 12/2013   EGD   CAD (coronary artery disease)    ETT/Lexiscan-Myoview (11/14):  Normal; no ischemia, EF 67%   CKD (chronic kidney disease), stage III (HCC)    Colon polyp 09/2010   adenoma   COPD (chronic obstructive pulmonary disease) ([place]) 2009   spirometry with "moderate COPD"   Depression    Diabetes mellitus without complication (HCC)    Dyspnea    GERD (gastroesophageal reflux disease)    GI bleed    after polypectomy/admitted   Hair loss 08/07/2018   HTN (hypertension)    Hypercholesterolemia    Hypomagnesemia 09/15/2022   Memory loss    Myocardial infarction ([place])    Obesity    Pneumonia 09/15/2022   Sleep apnea    Spontaneous pneumothorax    Synovial cyst of right popliteal space 07/20/2018   Vertigo    Past Surgical History:  Procedure Laterality Date   APPENDECTOMY     CORONARY ANGIOPLASTY WITH STENT PLACEMENT  2007   LAD   PLEURAL SCARIFICATION  1987   TONSILLECTOMY     VARICOCELECTOMY     Family History  Problem Relation Age of Onset   Heart disease Mother    CVA Mother    Dementia Mother    Coronary artery disease Father 2   Heart disease Father    Stroke Father    Dementia Sister    Cancer - Other Brother    Hypertension Other    Hyperlipidemia Other    Colon polyps Sister     Cancer Sister    Allergies as of 02/14/2023       Reactions   Latex Hives   Altace [ramipril] Cough   Codeine    Makes sick   Losartan Cough   cough   Remeron [mirtazapine] Other (See Comments)   Cognitive changes   Tape  Blisters   Zantac [ranitidine Hcl] Dermatitis        Medication List        Accurate as of February 14, 2023  1:30 PM. If you have any questions, ask your nurse or doctor.          STOP taking these medications    iron polysaccharides 150 MG capsule Commonly known as: NIFEREX Stopped by: Howard Pouch, DO   multivitamin tablet Stopped by: Howard Pouch, DO       TAKE these medications    Accu-Chek Guide test strip Generic drug: glucose blood 1 each by Other route daily.   amLODipine 5 MG tablet Commonly known as: NORVASC Take 1 tablet (5 mg total) by mouth daily.   atorvastatin 80 MG tablet Commonly known as: LIPITOR Take 1 tablet (80 mg total) by mouth daily.   blood glucose meter kit and supplies Dispense based on patient and insurance preference. Use up to three times daily as directed. (FOR ICD-10 E10.9, E11.9).   buPROPion 300 MG 24 hr tablet Commonly known as: WELLBUTRIN XL Take 1 tablet (300 mg total) by mouth daily.   clopidogrel 75 MG tablet Commonly known as: PLAVIX Take 1 tablet (75 mg total) by mouth daily.   dapagliflozin propanediol 10 MG Tabs tablet Commonly known as: Farxiga Take 1 tablet (10 mg total) by mouth daily before breakfast.   famotidine 20 MG tablet Commonly known as: Pepcid Take 1 tablet (20 mg total) by mouth 2 (two) times daily.   glipiZIDE 5 MG tablet Commonly known as: GLUCOTROL Once daily with first meal of the day. Take with a meal.   magnesium chloride 64 MG Tbec SR tablet Commonly known as: SLOW-MAG Take 1 tablet (64 mg total) by mouth 2 (two) times daily.   metoprolol tartrate 50 MG tablet Commonly known as: LOPRESSOR Take 1 tablet (50 mg total) by mouth daily.   mirabegron ER  50 MG Tb24 tablet Commonly known as: Myrbetriq Take 1 tablet (50 mg total) by mouth daily. What changed: Another medication with the same name was removed. Continue taking this medication, and follow the directions you see here. Changed by: Howard Pouch, DO   nitroGLYCERIN 0.4 MG SL tablet Commonly known as: NITROSTAT Place 1 tablet (0.4 mg total) under the tongue every 5 (five) minutes as needed for chest pain (3 doses max).   pantoprazole 40 MG tablet Commonly known as: PROTONIX Take 1 tablet (40 mg total) by mouth 2 (two) times daily.   PARoxetine 40 MG tablet Commonly known as: PAXIL Take 1 tablet (40 mg total) by mouth daily.   Symbicort 160-4.5 MCG/ACT inhaler Generic drug: budesonide-formoterol Inhale 2 puffs into the lungs 2 (two) times daily.   tamsulosin 0.4 MG Caps capsule Commonly known as: FLOMAX   traZODone 50 MG tablet Commonly known as: DESYREL Take 1 tablet (50 mg total) by mouth at bedtime.   VITAMIN B 12 PO Take by mouth.        All past medical history, surgical history, allergies, family history, immunizations andmedications were updated in the EMR today and reviewed under the history and medication portions of their EMR.     ROS Negative, with the exception of above mentioned in HPI   Objective:  BP 111/69   Pulse 65   Temp 97.9 F (36.6 C)   Wt 203 lb 12.8 oz (92.4 kg)   SpO2 97%   BMI 29.24 kg/m  Body mass index is 29.24 kg/m. Physical Exam Vitals and  nursing note reviewed.  Constitutional:      General: He is not in acute distress.    Appearance: Normal appearance. He is not ill-appearing, toxic-appearing or diaphoretic.  HENT:     Head: Normocephalic and atraumatic.  Eyes:     General: No scleral icterus.       Right eye: No discharge.        Left eye: No discharge.     Extraocular Movements: Extraocular movements intact.     Pupils: Pupils are equal, round, and reactive to light.  Cardiovascular:     Rate and Rhythm: Normal  rate and regular rhythm.  Pulmonary:     Effort: Pulmonary effort is normal. No respiratory distress.     Breath sounds: Normal breath sounds. No wheezing, rhonchi or rales.  Musculoskeletal:     Cervical back: Neck supple.     Right lower leg: No edema.     Left lower leg: No edema.  Lymphadenopathy:     Cervical: No cervical adenopathy.  Skin:    General: Skin is warm and dry.     Coloration: Skin is not jaundiced or pale.     Findings: No rash.  Neurological:     Mental Status: He is alert and oriented to person, place, and time. Mental status is at baseline.  Psychiatric:        Mood and Affect: Mood normal.        Behavior: Behavior normal.        Thought Content: Thought content normal.        Judgment: Judgment normal.     No results found. No results found. Results for orders placed or performed in visit on 02/14/23 (from the past 24 hour(s))  POCT HgB A1C     Status: Abnormal   Collection Time: 02/14/23  1:18 PM  Result Value Ref Range   Hemoglobin A1C 9.3 (A) 4.0 - 5.6 %   HbA1c POC (<> result, manual entry) 9.3 4.0 - 5.6 %   HbA1c, POC (prediabetic range) 9.3 (A) 5.7 - 6.4 %   HbA1c, POC (controlled diabetic range) 9.3 (A) 0.0 - 7.0 %    Assessment/Plan: BOHAN BOTHUN is a 77 y.o. male present for OV for  Recurrent major depressive disorder, in partial remission (HCC) Stable Continue paxil to 40 mg Increase Trazodone 200 mg nightly> 300 qhs - Continue wellbutrin 300 QD. -Tried meds: Remeron (possible memory changes), zyprexa (arthralgias) - Follow-up 4 months  Gastroesophageal reflux disease, esophagitis presence not specified Stable Continue Protonix 40 mg twice a day, was unable to take back secondary to recurrence of symptoms. Continue Pepcid twice daily today B12, vitamin D and mag up-to-date  Chronic bronchitis, unspecified chronic bronchitis type (HCC) Stable Continue Symbicort twice daily  Atherosclerosis of native coronary artery of native  heart without angina pectoris/Essential hypertension/HYPERCHOLESTEROLEMIA/History of MI (myocardial infarction) Stage 3 chronic kidney disease/ iron deficiency anemia/acquired thrombophilia Stable - following with cards, Dr. Johnsie Cancel. Continue atorvastatin Continue metoprolol 50 mg Continue amlodipine to 5 mg daily -  plavix continue per cardiology - low sodium diet and exercise encouraged.   Type 2 diabetes mellitus with hyperlipidemia/CKD 3 - A1c today 5.8--> 6.0-->6.1>>6.7> 6.5> 7.1 >7.1> 6.7> 6.8 >7.8> 7.5> 8.1 > 8.3 > 9.3 collected today.   Goal of A1c less than 7 Continue Farxiga 10 mg daily Increase Glipizide 5/10 bid May need second agent, consider injectable if so. Tried: Metformin-discontinued secondary to diarrhea, [name]-patient has reservations about using since hospitalization for COVID-pneumonia and  [name] occurring at the same time. Continue atorvastatin 80 - Foot exam: Completed today 08/27/2022 - eye exam: Completed 06/25/2021-patient was encouraged to schedule his eye exam. - PNA: Series completed - Flu: Completed today 08/27/2022 - Urine Microalbumin w/creat. Ratio: completed 08/27/2022  Cerebral atrophy/microvascular ischemic occlusive disease Patient is having mild cognitive changes. Referral had been placed to neurology Patient is on a statin  Iron deficiency anemia: Iron panel is up-to-date.  Patient cannot tolerate iron supplement, was encouraged to at least start Flintstone vitamin  Nocturia Encouraged him to follow-up with his urologist.   Continue Flomax prescribed by urology.  prescribed by urology. Continue Myrbetriq to 50 mg   Polyarthralgia/myalgia We discussed multiple possible causes of his arthralgia and weakness.He is seeing improvement since starting magnesium.  Tolerating mag BID. Feeling better. Strength better Patient was encouraged to hydrate well. - Comp Met (CMET) creatinine 1.44 - CK> normal at 44 - Recent vitamin D and B12 -  normal Sedimentation rate-mildly elevated at 30 -Iron panel was stable - Urinalysis w microscopic + reflex cultur> WNL - ANA w/ panel, ccp, RF and uric acid WNL Pain: tylenol for now. He can not take codeine and would  require vicoden.    Reviewed expectations re: course of current medical issues. Discussed self-management of symptoms. Outlined signs and symptoms indicating need for more acute intervention. Patient verbalized understanding and all questions were answered. Patient received an After-Visit Summary.    Orders Placed This Encounter  Procedures   POCT HgB A1C   No orders of the defined types were placed in this encounter.  Referral Orders  No referral(s) requested today      Note is dictated utilizing voice recognition software. Although note has been proof read prior to signing, occasional typographical errors still can be missed. If any questions arise, please do not hesitate to call for verification.   electronically signed by:  Howard Pouch, DO  [place]

## 2023-02-24 DIAGNOSIS — R350 Frequency of micturition: Secondary | ICD-10-CM | POA: Diagnosis not present

## 2023-02-24 DIAGNOSIS — N401 Enlarged prostate with lower urinary tract symptoms: Secondary | ICD-10-CM | POA: Diagnosis not present

## 2023-02-24 DIAGNOSIS — R3915 Urgency of urination: Secondary | ICD-10-CM | POA: Diagnosis not present

## 2023-03-18 ENCOUNTER — Other Ambulatory Visit: Payer: Medicare HMO | Admitting: Pharmacist

## 2023-03-18 NOTE — Progress Notes (Signed)
03/18/2023 Name: [name] MRN: [id number] DOB: 1946/09/08  Chief Complaint  Patient presents with   Medication Management    [name] is a 77 y.o. year old male. He was outreached by the clinical pharmacist today. Spoke with patient' wife.    They were referred to the pharmacist by a quality report due to low adherence - failed 2023 MAD (medication adherence to diabetes med)   Subjective:  Care Team: Primary Care Provider: Natalia Leatherwood, DO   Medication Access/Adherence  Current Pharmacy:  [facility] [phone] - [place], [place] - [address] [place] [address] Phone: [phone] Fax: [phone]  [facility] - [place], [place] - [address] [place] [address] Phone: [phone] Fax: [phone]   Patient reports affordability concerns with their medications: Yes  Patient reports access/transportation concerns to their pharmacy: No  Patient reports adherence concerns with their medications:  No      Diabetes:  Current medications: Farxiga 10mg  daily and glipizide 5mg  - take 1 tablet with largest meal of the day and 0.5 tablet with another meal (dose increased after last OV 02/14/2023)   Medications tried in the past: metformin   Current glucose readings: patient's wife reports she has not really been checking since dose of glipizide was increased but prior to that blood glucose was usually > 200    Denies hypoglycemic s/sx including No dizziness, shakiness, sweating.  Reports hyperglycemic symptoms including polyuria, polydipsia.    Medication Adherence:  Atorvastatin 80mg  refilled 90 ds 11/29/2022 and 02/27/2023 Farxiga 10mg  refilled 30 ds 11/24/2022, 01/24/2023 and 02/20/2023 Glipizide 5mg  refilled 90 ds 11/04/2022 and 02/14/2023   Objective:  Lab Results  Component Value Date   HGBA1C 9.3 (A) 02/14/2023   HGBA1C 9.3 02/14/2023   HGBA1C 9.3 (A)  02/14/2023   HGBA1C 9.3 (A) 02/14/2023    Lab Results  Component Value Date   CREATININE 1.52 (H) 01/31/2023   BUN 19 01/31/2023   NA 137 01/31/2023   K 4.5 01/31/2023   CL 99 01/31/2023   CO2 25 01/31/2023    Lab Results  Component Value Date   CHOL 120 01/09/2022   HDL 44 01/09/2022   LDLCALC 45 01/09/2022   LDLDIRECT 58.0 11/09/2020   TRIG 262 (H) 01/09/2022   CHOLHDL 2.7 01/09/2022    Medications Reviewed Today     Reviewed by Henrene Pastor, RPH-CPP (Pharmacist) on 03/18/23 at 1326  Med List Status: <None>   Medication Order Taking? Sig Documenting Provider Last Dose Status Informant  amLODipine (NORVASC) 5 MG tablet [id number] Yes Take 1 tablet (5 mg total) by mouth daily. Kuneff, Renee A, DO Taking Active   atorvastatin (LIPITOR) 80 MG tablet [id number] Yes Take 1 tablet (80 mg total) by mouth daily. Natalia Leatherwood, DO Taking Active            Med Note Clydie Braun, Jameca Chumley B   Tue Mar 18, 2023  1:07 PM) Per wife - patient has surplus of atorvastatin from [facility] in 2023  blood glucose meter kit and supplies [id number]  Dispense based on patient and insurance preference. Use up to three times daily as directed. (FOR ICD-10 E10.9, E11.9). Kuneff, Renee A, DO  Active   buPROPion (WELLBUTRIN XL) 300 MG 24 hr tablet [id number] Yes Take 1 tablet (300 mg total) by mouth daily. Kuneff, Renee A, DO Taking Active   clopidogrel (PLAVIX) 75 MG tablet [id number]  Yes Take 1 tablet (75 mg total) by mouth daily. Wendall Stade, MD Taking Active   Cyanocobalamin (VITAMIN B 12 PO) [id number] Yes Take by mouth. [provider] Taking Active   dapagliflozin propanediol (FARXIGA) 10 MG TABS tablet [id number] Yes Take 1 tablet (10 mg total) by mouth daily before breakfast. Kuneff, Renee A, DO Taking Active   famotidine (PEPCID) 20 MG tablet [id number] Yes Take 1 tablet (20 mg total) by mouth 2 (two) times daily. Kuneff, Renee A, DO Taking Active   glipiZIDE (GLUCOTROL) 10 MG tablet [id number] Yes 1  tab daily with largest meal of the day and 1/2 tab other meal of day. Kuneff, Renee A, DO Taking Active   glucose blood (ACCU-CHEK GUIDE) test strip [id number] Yes 1 each by Other route daily. Kuneff, Renee A, DO Taking Active   magnesium chloride (SLOW-MAG) 64 MG TBEC SR tablet [id number] Yes Take 1 tablet (64 mg total) by mouth 2 (two) times daily.  Patient taking differently: Take 1 tablet by mouth daily.   Natalia Leatherwood, DO Taking Active            Med Note Clydie Braun, Yanilen Adamik B   Tue Mar 18, 2023  1:08 PM) Has difficulty tolerating twice a day  metoprolol tartrate (LOPRESSOR) 50 MG tablet [id number] Yes Take 1 tablet (50 mg total) by mouth daily. Kuneff, Renee A, DO Taking Active   mirabegron ER (MYRBETRIQ) 50 MG TB24 tablet [id number] Yes Take 1 tablet (50 mg total) by mouth daily. Kuneff, Renee A, DO Taking Active   nitroGLYCERIN (NITROSTAT) 0.4 MG SL tablet [id number]  Place 1 tablet (0.4 mg total) under the tongue every 5 (five) minutes as needed for chest pain (3 doses max). Wendall Stade, MD  Active   pantoprazole (PROTONIX) 40 MG tablet [id number] Yes Take 1 tablet (40 mg total) by mouth 2 (two) times daily. Kuneff, Renee A, DO Taking Active   PARoxetine (PAXIL) 40 MG tablet [id number] Yes Take 1 tablet (40 mg total) by mouth daily. Felix Pacini A, DO Taking Active   SYMBICORT 160-4.5 MCG/ACT inhaler [id number] Yes Inhale 2 puffs into the lungs 2 (two) times daily. Kuneff, Renee A, DO Taking Active   tamsulosin (FLOMAX) 0.4 MG CAPS capsule [id number] Yes  [provider] Taking Active   traZODone (DESYREL) 300 MG tablet [id number] Yes Take 1 tablet (300 mg total) by mouth at bedtime. Kuneff, Renee A, DO Taking Active               Assessment/Plan:   Diabetes: A1c not at goal - initial goal is to get A1c < 8.0% - Reviewed long term cardiovascular and renal outcomes of uncontrolled blood sugar - Reviewed goal A1c, goal fasting, and goal 2 hour post prandial glucose - Recommended  restart checking blood glucose daily - check at varying times of day and record. If blood glucose continues to be > 200 then contact PCP office.  - Recommend to continue current medications for diabetes (last adjustment in regimen was 02/14/2023)  - Meets financial criteria for [place] patient assistance program through [facility] and Me Program. Will collaborate with provider, CPhT, and patient to pursue assistance.    Medication Management: Adherence has improved for 2024 per refill history - reviewed medication list and updated - Reviewed refill history and discussed adherence and addressed adherence barriers - See above about Farxiga medication assistance program plans  Follow Up Plan: 1 month by phone to check home blood glucose and med adherence  Jobina Maita  Clydie Braun, PharmD Clinical Pharmacist [facility]

## 2023-03-19 ENCOUNTER — Telehealth: Payer: Self-pay

## 2023-03-19 ENCOUNTER — Other Ambulatory Visit (HOSPITAL_COMMUNITY): Payer: Self-pay

## 2023-03-19 ENCOUNTER — Encounter: Payer: Self-pay | Admitting: Hematology

## 2023-03-19 NOTE — Telephone Encounter (Signed)
-----   Message from Henrene Pastor, RPH-CPP sent at 03/18/2023  1:50 PM EDT ----- Please reach out to patient or his wife to discuss medication assistance program for Farxiga. Thanks so much! Tammy

## 2023-03-19 NOTE — Telephone Encounter (Signed)
Pt [place] for Farxiga until 11/25/2023. Medication will be shipped to pt's home address in 10-14 business days after processing new rx for [place].

## 2023-03-19 NOTE — Addendum Note (Signed)
Addended by: Henrene Pastor B on: 03/19/2023 04:34 PM   Modules accepted: Orders

## 2023-03-20 MED ORDER — DAPAGLIFLOZIN PROPANEDIOL 10 MG PO TABS: 10.0000 mg | ORAL_TABLET | Freq: Every day | ORAL | 2 refills | Status: DC

## 2023-03-20 NOTE — Addendum Note (Signed)
Addended by: Felix Pacini A on: 03/20/2023 08:30 AM   Modules accepted: Orders

## 2023-03-23 ENCOUNTER — Other Ambulatory Visit: Payer: Self-pay | Admitting: Family Medicine

## 2023-03-25 ENCOUNTER — Other Ambulatory Visit: Payer: Self-pay

## 2023-03-26 NOTE — Telephone Encounter (Signed)
Pharmacy has processed medication, should be shipped to pt's home in 10-14 days.

## 2023-04-10 ENCOUNTER — Other Ambulatory Visit: Payer: Medicare HMO | Admitting: Pharmacist

## 2023-04-10 NOTE — Progress Notes (Signed)
04/10/2023 Name: [name] MRN: [id number] DOB: Aug 14, 1946  Chief Complaint  Patient presents with   Medication Management   Diabetes    [name] is a 77 y.o. year old male. He was outreached by the clinical pharmacist today. Spoke with patient' wife.    They were referred to the pharmacist by a quality report due to low adherence - failed 2023 MAD (medication adherence to diabetes med). Also noted to have uncontrolled type 2 DM. Last A1c was 9.3% 02/14/2023.   Subjective:  Care Team: Primary Care Provider: Natalia Leatherwood, DO   Medication Access/Adherence  Current Pharmacy:  [facility] [phone] - [place], [place] - [address] [place] [address] Phone: [phone] Fax: [phone]  [facility] - [place], [place] - [address] [place] [address] Phone: [phone] Fax: [phone]  [facility] - [place], [place] - [address] [place] [address] Phone: [phone] Fax: [phone]   Patient reports affordability concerns with their medications: Yes  - at last visit [place] Farxiga. Patient was approved. Endorsed that he has received his first shipment for 90 days at around May 10th.   Patient reports access/transportation concerns to their pharmacy: No  Patient reports adherence concerns with their medications:  No      Diabetes:  Current medications: Farxiga 10mg  daily and glipizide 5mg  - take 1 tablet with largest meal (evening meal)  of the day and 0.5 tablet with another meal (morning meal). Glipizide dose was increased after last OV 02/14/2023.   Medications tried in the past: metformin - GI side effects  Current glucose readings:  AM - 175, 161, 183, 173 PM - 180, 231, 238, 230, 278 Prior to increase in glipizide 02/14/2023 blood glucose was always > 200.  Denies hypoglycemic s/sx including No  dizziness, shakiness, sweating.  Reports hyperglycemic symptoms including polyuria, polydipsia - improved per patient.    Medication Adherence:  Atorvastatin 80mg  refilled 90 ds 11/29/2022 and 02/27/2023 Farxiga 10mg  refilled 30 ds 11/24/2022, 01/24/2023, 02/20/2023 and 03/23/2023 Glipizide 5mg  refilled 90 ds 11/04/2022 and 02/14/2023   Objective:  Lab Results  Component Value Date   HGBA1C 9.3 (A) 02/14/2023   HGBA1C 9.3 02/14/2023   HGBA1C 9.3 (A) 02/14/2023   HGBA1C 9.3 (A) 02/14/2023    Lab Results  Component Value Date   CREATININE 1.52 (H) 01/31/2023   BUN 19 01/31/2023   NA 137 01/31/2023   K 4.5 01/31/2023   CL 99 01/31/2023   CO2 25 01/31/2023    Lab Results  Component Value Date   CHOL 120 01/09/2022   HDL 44 01/09/2022   LDLCALC 45 01/09/2022   LDLDIRECT 58.0 11/09/2020   TRIG 262 (H) 01/09/2022   CHOLHDL 2.7 01/09/2022    Medications Reviewed Today     Reviewed by Henrene Pastor, RPH-CPP (Pharmacist) on 04/10/23 at 1200  Med List Status: <None>   Medication Order Taking? Sig Documenting Provider Last Dose Status Informant  amLODipine (NORVASC) 5 MG tablet [id number] Yes Take 1 tablet (5 mg total) by mouth daily. Kuneff, Renee A, DO Taking Active   atorvastatin (LIPITOR) 80 MG tablet [id number] Yes Take 1 tablet (80 mg total) by mouth daily. Kuneff, Renee A, DO Taking Active            Med Note (Shacoya Burkhammer B   Tue  Mar 18, 2023  1:07 PM) Per wife - patient has surplus of atorvastatin from [facility] in 2023  blood glucose meter kit and supplies [id number] Yes Dispense based on patient and insurance preference. Use up to three times daily as directed. (FOR ICD-10 E10.9, E11.9). Kuneff, Renee A, DO Taking Active   buPROPion (WELLBUTRIN XL) 300 MG 24 hr tablet [id number] Yes Take 1 tablet (300 mg total) by mouth daily. Kuneff, Renee A, DO Taking Active   clopidogrel (PLAVIX) 75 MG tablet [id number] Yes Take 1 tablet (75 mg total) by mouth daily. Wendall Stade, MD  Taking Active   Cyanocobalamin (VITAMIN B 12 PO) [id number] Yes Take by mouth. [provider] Taking Active   dapagliflozin propanediol (FARXIGA) 10 MG TABS tablet [id number] Yes Take 1 tablet (10 mg total) by mouth daily before breakfast. Kuneff, Renee A, DO Taking Active   famotidine (PEPCID) 20 MG tablet [id number] Yes Take 1 tablet (20 mg total) by mouth 2 (two) times daily. Kuneff, Renee A, DO Taking Active   glipiZIDE (GLUCOTROL) 10 MG tablet [id number] Yes 1 tab daily with largest meal of the day and 1/2 tab other meal of day. Kuneff, Renee A, DO Taking Active   glucose blood (ACCU-CHEK GUIDE) test strip [id number] Yes 1 each by Other route daily. Kuneff, Renee A, DO Taking Active   magnesium chloride (SLOW-MAG) 64 MG TBEC SR tablet [id number] Yes Take 1 tablet (64 mg total) by mouth 2 (two) times daily.  Patient taking differently: Take 1 tablet by mouth daily.   Natalia Leatherwood, DO Taking Active            Med Note Clydie Braun, Zoltan Genest B   Tue Mar 18, 2023  1:08 PM) Has difficulty tolerating twice a day  metoprolol tartrate (LOPRESSOR) 50 MG tablet [id number] Yes Take 1 tablet (50 mg total) by mouth daily. Kuneff, Renee A, DO Taking Active   mirabegron ER (MYRBETRIQ) 50 MG TB24 tablet [id number] Yes Take 1 tablet (50 mg total) by mouth daily. Kuneff, Renee A, DO Taking Active   nitroGLYCERIN (NITROSTAT) 0.4 MG SL tablet [id number] Yes Place 1 tablet (0.4 mg total) under the tongue every 5 (five) minutes as needed for chest pain (3 doses max). Wendall Stade, MD Taking Active   pantoprazole (PROTONIX) 40 MG tablet [id number] Yes Take 1 tablet (40 mg total) by mouth 2 (two) times daily. Kuneff, Renee A, DO Taking Active   PARoxetine (PAXIL) 40 MG tablet [id number] Yes Take 1 tablet (40 mg total) by mouth daily. Felix Pacini A, DO Taking Active   SYMBICORT 160-4.5 MCG/ACT inhaler [id number] Yes Inhale 2 puffs into the lungs 2 (two) times daily. Kuneff, Renee A, DO Taking Active   tamsulosin (FLOMAX)  0.4 MG CAPS capsule [id number] Yes  [provider] Taking Active   traZODone (DESYREL) 300 MG tablet [id number] Yes Take 1 tablet (300 mg total) by mouth at bedtime. Kuneff, Renee A, DO Taking Active               Assessment/Plan:   Diabetes: A1c not at goal - initial goal is to get A1c < 8.0%; Home blood glucose improved but not at goal - Reviewed goal A1c, goal fasting, and goal 2 hour post prandial glucose - Continue checking blood glucose 1 to 2 times daily at varying times of day and record. If blood glucose continues to be > 200 then contact PCP office.  - Recommend to continue current medications for diabetes (last adjustment in regimen  was 02/14/2023)  - Approved to get Marcelline Deist form [place] thru 11/25/2023   Medication Management: Adherence has improved for 2024 per refill history - reviewed medication list and updated - Reviewed refill history and discussed adherence and addressed adherence barriers - continue to use medication boxes to improve adherence.  - See above about Farxiga medication assistance program plans  Follow Up Plan: 1 month by phone to check home blood glucose and med adherence  Looks like Dr Claiborne Billings recommended patient follow up around 06/09/2023 but no appt set up yet. Make appt for patient for 06/09/2023 at 1:30pm  Henrene Pastor, PharmD Clinical Pharmacist [facility]

## 2023-05-13 ENCOUNTER — Other Ambulatory Visit: Payer: Medicare HMO | Admitting: Pharmacist

## 2023-05-16 ENCOUNTER — Other Ambulatory Visit: Payer: Medicare HMO | Admitting: Pharmacist

## 2023-05-16 DIAGNOSIS — E1169 Type 2 diabetes mellitus with other specified complication: Secondary | ICD-10-CM

## 2023-05-16 NOTE — Progress Notes (Signed)
05/16/2023 Name: [name] MRN: [id number] DOB: [birth year]  No chief complaint on file.   [name] is a 77 y.o. year old male. He was outreached by the clinical pharmacist today. Spoke with patient' wife.    They were referred to the pharmacist by a quality report due to low adherence - failed 2023 MAD (medication adherence to diabetes med). Also noted to have uncontrolled type 2 DM. Last A1c was 9.3% 02/14/2023.   Subjective:  Care Team: Primary Care Provider: Natalia Leatherwood, DO - next appt 06/09/2023  Medication Access/Adherence  Patient reports affordability concerns with their medications: Yes  - at last visit [place] Farxiga. Patient was approved. Endorsed that he has received his first shipment for 90 days at around May 10th.   Patient reports access/transportation concerns to their pharmacy: No  Patient reports adherence concerns with their medications:  No      Diabetes:  Current medications: Farxiga 10mg  daily and glipizide 5mg  - take 1 tablet with largest meal (evening meal)  of the day and 0.5 tablet with another meal (morning meal). Glipizide dose was increased after last OV 02/14/2023.   Medications tried in the past: metformin - GI side effects  Current glucose readings:  AM - 182, 190, 197 PM - 217, 200, 221 Prior to increase in glipizide 02/14/2023 blood glucose was always > 200.  Denies hypoglycemic s/sx including No dizziness, shakiness, sweating.  Reports hyperglycemic symptoms including polyuria, polydipsia - improved after increase in glipizide dose but recently has been about the same.    Medication Adherence:  Atorvastatin 80mg  refilled 90 ds 11/29/2022 and 02/27/2023 (patient's wife states he has surplus from [employer] filling in 2023) Farxiga 10mg  refilled 30 ds 11/24/2022, 01/24/2023, 02/20/2023 and 03/23/2023 - [place] thru [place] now.  Glipizide 5mg  refilled 90 ds  11/04/2022 and 02/14/2023   Objective:  Lab Results  Component Value Date   HGBA1C 9.3 (A) 02/14/2023   HGBA1C 9.3 02/14/2023   HGBA1C 9.3 (A) 02/14/2023   HGBA1C 9.3 (A) 02/14/2023    Lab Results  Component Value Date   CREATININE 1.52 (H) 01/31/2023   BUN 19 01/31/2023   NA 137 01/31/2023   K 4.5 01/31/2023   CL 99 01/31/2023   CO2 25 01/31/2023    Lab Results  Component Value Date   CHOL 120 01/09/2022   HDL 44 01/09/2022   LDLCALC 45 01/09/2022   LDLDIRECT 58.0 11/09/2020   TRIG 262 (H) 01/09/2022   CHOLHDL 2.7 01/09/2022    Medications Reviewed Today     Reviewed by Henrene Pastor, RPH-CPP (Pharmacist) on 04/10/23 at 1200  Med List Status: <None>   Medication Order Taking? Sig Documenting Provider Last Dose Status Informant  amLODipine (NORVASC) 5 MG tablet [id number] Yes Take 1 tablet (5 mg total) by mouth daily. Kuneff, Renee A, DO Taking Active   atorvastatin (LIPITOR) 80 MG tablet [id number] Yes Take 1 tablet (80 mg total) by mouth daily. Natalia Leatherwood, DO Taking Active            Med Note Clydie Braun, Issaac Shipper B   Tue Mar 18, 2023  1:07 PM) Per wife - patient has surplus of atorvastatin from [facility] in 2023  blood glucose meter kit and supplies [id number] Yes Dispense based on patient and insurance preference. Use up to three times daily as directed. (FOR ICD-10 E10.9, E11.9). Kuneff, Renee A, DO Taking Active   buPROPion [facility]  XL) 300 MG 24 hr tablet [id number] Yes Take 1 tablet (300 mg total) by mouth daily. Kuneff, Renee A, DO Taking Active   clopidogrel (PLAVIX) 75 MG tablet [id number] Yes Take 1 tablet (75 mg total) by mouth daily. Wendall Stade, MD Taking Active   Cyanocobalamin (VITAMIN B 12 PO) [id number] Yes Take by mouth. [provider] Taking Active   dapagliflozin propanediol (FARXIGA) 10 MG TABS tablet [id number] Yes Take 1 tablet (10 mg total) by mouth daily before breakfast. Kuneff, Renee A, DO Taking Active   famotidine (PEPCID) 20 MG  tablet [id number] Yes Take 1 tablet (20 mg total) by mouth 2 (two) times daily. Kuneff, Renee A, DO Taking Active   glipiZIDE (GLUCOTROL) 10 MG tablet [id number] Yes 1 tab daily with largest meal of the day and 1/2 tab other meal of day. Kuneff, Renee A, DO Taking Active   glucose blood (ACCU-CHEK GUIDE) test strip [id number] Yes 1 each by Other route daily. Kuneff, Renee A, DO Taking Active   magnesium chloride (SLOW-MAG) 64 MG TBEC SR tablet [id number] Yes Take 1 tablet (64 mg total) by mouth 2 (two) times daily.  Patient taking differently: Take 1 tablet by mouth daily.   Natalia Leatherwood, DO Taking Active            Med Note Clydie Braun, Deontae Robson B   Tue Mar 18, 2023  1:08 PM) Has difficulty tolerating twice a day  metoprolol tartrate (LOPRESSOR) 50 MG tablet [id number] Yes Take 1 tablet (50 mg total) by mouth daily. Kuneff, Renee A, DO Taking Active   mirabegron ER (MYRBETRIQ) 50 MG TB24 tablet [id number] Yes Take 1 tablet (50 mg total) by mouth daily. Kuneff, Renee A, DO Taking Active   nitroGLYCERIN (NITROSTAT) 0.4 MG SL tablet [id number] Yes Place 1 tablet (0.4 mg total) under the tongue every 5 (five) minutes as needed for chest pain (3 doses max). Wendall Stade, MD Taking Active   pantoprazole (PROTONIX) 40 MG tablet [id number] Yes Take 1 tablet (40 mg total) by mouth 2 (two) times daily. Kuneff, Renee A, DO Taking Active   PARoxetine (PAXIL) 40 MG tablet [id number] Yes Take 1 tablet (40 mg total) by mouth daily. Felix Pacini A, DO Taking Active   SYMBICORT 160-4.5 MCG/ACT inhaler [id number] Yes Inhale 2 puffs into the lungs 2 (two) times daily. Kuneff, Renee A, DO Taking Active   tamsulosin (FLOMAX) 0.4 MG CAPS capsule [id number] Yes  [provider] Taking Active   traZODone (DESYREL) 300 MG tablet [id number] Yes Take 1 tablet (300 mg total) by mouth at bedtime. Kuneff, Renee A, DO Taking Active               Assessment/Plan:   Diabetes: A1c not at goal - initial goal is to get A1c <  8.0%; Home blood glucose improved but not at goal - Reviewed goal A1c, goal fasting, and goal 2 hour post prandial glucose - Continue checking blood glucose 1 to 2 times daily at varying times of day and record.  - Recommend to continue current medications for diabetes - checking with PCP about increasing glipizide to 5mg  twice a day.  - Approved [place] Farxiga form [place] 11[place]25/2023   Medication Management: Adherence has improved for 2024 per refill history - reviewed medication list and updated - Reviewed refill history and discussed adherence and addressed adherence barriers - continue to use medication boxes to improve adherence.   Follow Up Plan: 1 month with PCP. Clinical  Pharmacist Practitioner will check back with patient to check home blood glucose and med adherence in 2 months.    Henrene Pastor, PharmD Clinical Pharmacist [facility]

## 2023-05-22 ENCOUNTER — Other Ambulatory Visit (HOSPITAL_BASED_OUTPATIENT_CLINIC_OR_DEPARTMENT_OTHER): Payer: Self-pay

## 2023-05-22 ENCOUNTER — Telehealth: Payer: Self-pay | Admitting: Pharmacist

## 2023-05-22 NOTE — Telephone Encounter (Signed)
Received call from patient's wife reporting that his blood glucose in evening is still > 200.  She just picked up prescription for glipizide that was filled 05/19/2023 and dose is still 10mg  with largest meal of the day and 5mg  with other meal.  During last phone visit we had discuss possible increasing to 10mg  twice a day. Sent message to PCP but I did not get response back about increasing dose.  Patient will see Dr Claiborne Billings 06/09/2023 and will discuss blood glucose at appointment.  Recommended [name] continue glipizide 10mg  at currently dose and continue to take [place] 10mg  daily.

## 2023-06-09 ENCOUNTER — Ambulatory Visit (INDEPENDENT_AMBULATORY_CARE_PROVIDER_SITE_OTHER): Payer: Medicare HMO | Admitting: Family Medicine

## 2023-06-09 ENCOUNTER — Encounter: Payer: Self-pay | Admitting: Family Medicine

## 2023-06-09 VITALS — BP 137/78 | HR 69 | Temp 98.2°F | Ht 70.0 in | Wt 211.6 lb

## 2023-06-09 DIAGNOSIS — Z5181 Encounter for therapeutic drug level monitoring: Secondary | ICD-10-CM

## 2023-06-09 DIAGNOSIS — K2100 Gastro-esophageal reflux disease with esophagitis, without bleeding: Secondary | ICD-10-CM

## 2023-06-09 DIAGNOSIS — E785 Hyperlipidemia, unspecified: Secondary | ICD-10-CM

## 2023-06-09 DIAGNOSIS — R972 Elevated prostate specific antigen [PSA]: Secondary | ICD-10-CM

## 2023-06-09 DIAGNOSIS — I6782 Cerebral ischemia: Secondary | ICD-10-CM

## 2023-06-09 DIAGNOSIS — D500 Iron deficiency anemia secondary to blood loss (chronic): Secondary | ICD-10-CM | POA: Diagnosis not present

## 2023-06-09 DIAGNOSIS — G319 Degenerative disease of nervous system, unspecified: Secondary | ICD-10-CM

## 2023-06-09 DIAGNOSIS — I10 Essential (primary) hypertension: Secondary | ICD-10-CM

## 2023-06-09 DIAGNOSIS — I2510 Atherosclerotic heart disease of native coronary artery without angina pectoris: Secondary | ICD-10-CM

## 2023-06-09 DIAGNOSIS — I679 Cerebrovascular disease, unspecified: Secondary | ICD-10-CM | POA: Diagnosis not present

## 2023-06-09 DIAGNOSIS — E1169 Type 2 diabetes mellitus with other specified complication: Secondary | ICD-10-CM

## 2023-06-09 DIAGNOSIS — I252 Old myocardial infarction: Secondary | ICD-10-CM

## 2023-06-09 DIAGNOSIS — D6869 Other thrombophilia: Secondary | ICD-10-CM

## 2023-06-09 DIAGNOSIS — F330 Major depressive disorder, recurrent, mild: Secondary | ICD-10-CM

## 2023-06-09 DIAGNOSIS — N138 Other obstructive and reflux uropathy: Secondary | ICD-10-CM

## 2023-06-09 DIAGNOSIS — F5101 Primary insomnia: Secondary | ICD-10-CM

## 2023-06-09 DIAGNOSIS — N401 Enlarged prostate with lower urinary tract symptoms: Secondary | ICD-10-CM

## 2023-06-09 DIAGNOSIS — Z79899 Other long term (current) drug therapy: Secondary | ICD-10-CM

## 2023-06-09 LAB — POCT GLYCOSYLATED HEMOGLOBIN (HGB A1C)
HbA1c POC (<> result, manual entry): 8.2 % (ref 4.0–5.6)
HbA1c, POC (controlled diabetic range): 8.2 % — AB (ref 0.0–7.0)
HbA1c, POC (prediabetic range): 8.2 % — AB (ref 5.7–6.4)
Hemoglobin A1C: 8.2 % — AB (ref 4.0–5.6)

## 2023-06-09 LAB — MICROALBUMIN / CREATININE URINE RATIO
Creatinine,U: 43.9 mg/dL
Microalb Creat Ratio: 1.6 mg/g (ref 0.0–30.0)
Microalb, Ur: <0.7 mg/dL (ref 0.0–1.9)

## 2023-06-09 MED ORDER — PAROXETINE HCL 40 MG PO TABS: 40.0000 mg | ORAL_TABLET | Freq: Every day | ORAL | 1 refills | Status: DC

## 2023-06-09 MED ORDER — AMLODIPINE BESYLATE 5 MG PO TABS: 5.0000 mg | ORAL_TABLET | Freq: Every day | ORAL | 1 refills | Status: DC

## 2023-06-09 MED ORDER — MAGNESIUM CHLORIDE 64 MG PO TBEC: 1.0000 | DELAYED_RELEASE_TABLET | Freq: Two times a day (BID) | ORAL | 5 refills | Status: DC

## 2023-06-09 MED ORDER — METOPROLOL TARTRATE 50 MG PO TABS: 50.0000 mg | ORAL_TABLET | Freq: Every day | ORAL | 1 refills | Status: DC

## 2023-06-09 MED ORDER — DAPAGLIFLOZIN PROPANEDIOL 10 MG PO TABS: 10.0000 mg | ORAL_TABLET | Freq: Every day | ORAL | 2 refills | Status: DC

## 2023-06-09 MED ORDER — SYMBICORT 160-4.5 MCG/ACT IN AERO: 2.0000 | INHALATION_SPRAY | Freq: Two times a day (BID) | RESPIRATORY_TRACT | 4 refills | Status: DC

## 2023-06-09 MED ORDER — GLIPIZIDE 10 MG PO TABS: 10.0000 mg | ORAL_TABLET | Freq: Two times a day (BID) | ORAL | 1 refills | Status: DC

## 2023-06-09 MED ORDER — MIRABEGRON ER 50 MG PO TB24: 50.0000 mg | ORAL_TABLET | Freq: Every day | ORAL | 11 refills | Status: DC

## 2023-06-09 MED ORDER — TRAZODONE HCL 300 MG PO TABS: 300.0000 mg | ORAL_TABLET | Freq: Every day | ORAL | 1 refills | Status: DC

## 2023-06-09 MED ORDER — FAMOTIDINE 20 MG PO TABS: 20.0000 mg | ORAL_TABLET | Freq: Two times a day (BID) | ORAL | 3 refills | Status: DC

## 2023-06-09 MED ORDER — BUPROPION HCL ER (XL) 300 MG PO TB24: 300.0000 mg | ORAL_TABLET | Freq: Every day | ORAL | 1 refills | Status: DC

## 2023-06-09 MED ORDER — ATORVASTATIN CALCIUM 80 MG PO TABS: 80.0000 mg | ORAL_TABLET | Freq: Every day | ORAL | 3 refills | Status: DC

## 2023-06-09 NOTE — Patient Instructions (Addendum)
Return in about 15 weeks (around 09/22/2023).        Great to see you today.  I have refilled the medication(s) we provide.   If labs were collected, we will inform you of lab results once received either by echart message or telephone call.   - echart message- for normal results that have been seen by the patient already.   - telephone call: abnormal results or if patient has not viewed results in their echart.

## 2023-06-09 NOTE — Progress Notes (Signed)
[name] , [birth year], 77 y.o., male MRN: [id number] Patient Care Team    Relationship Specialty Notifications Start End  Natalia Leatherwood, DO PCP - General Family Medicine  12/25/15   Wendall Stade, MD PCP - Cardiology Cardiology Admissions 08/20/18   Johney Maine, MD Consulting Physician Hematology  09/03/16   Wendall Stade, MD Consulting Physician Cardiology  09/03/16   Charlott Rakes, MD Consulting Physician Gastroenterology  09/03/16   Ihor Gully, MD (Inactive) Consulting Physician Urology  09/10/16   Hollar, Ronal Fear, MD Referring Physician Dermatology  06/29/19   Jill Side, OD Referring Physician   01/31/23     Chief Complaint  Patient presents with   Diabetes     Subjective: Pt presents for an OV for follow-up on changes to his medications last month Recurrent major depressive disorder, in partial remission (HCC)/memory changes He reports compliance  with trazodone 200 mg nightly, Paxil 40 mg daily and Wellbutrin 300 mg daily. Zyprexa discontinued secondary to arthralgias. Prior note: Patient is present with his wife today. They report he is feeling better. He has not started the Flintstone vitamin. He is tolerating the magnesium supplement.He has more energy and less pain.  He stopped the zyprexa and started the higher dose trazodone 200 mg QHS. He is still not sleeping great, but ok for him.    Gastroesophageal reflux disease, esophagitis presence not specified/diarrhea/bloating  EGD 03/2016 by Dr. Bosie Clos with signs of barrett's esophagus. Patient was tried on lower dose medication, and was unable to tolerate secondary to return of symptoms.   His symptoms are well controlled with protonix 40 BID and pepcid BID.    COPD [facility]) He uses Symbicort daily and feels it is working well for him.   Atherosclerosis of native coronary artery of native heart without angina pectoris/Essential hypertension/HYPERCHOLESTEROLEMIA/History of MI  (myocardial infarction)/IDA He has known CAD with prior LAD stent from 2007. Pt reports compliance with metoprolol 50 mg daily and amlodipine 5 mg daily Qd, losartan 100 mg daily.  Patient denies chest pain, shortness of breath, dizziness or lower extremity edema.   Pt takes a daily Plavix. Pt is  prescribed statin and followed by Dr. Eden Emms Cardiology.  Diet: Low-sodium RF: Hypertension, hyperlipidemia, diabetes  Type 2 diabetes mellitus without complication, without long-term current use of insulin (HCC) patient reports compliance  with Farxiga 10 mg daily and glipizide 5 mg daily.   He had diarrhea  off metformin trial as well.  We discontinued metformin regardless, to not cause the potential of worsening diarrhea.   Patient denies dizziness, hyperglycemic or hypoglycemic events. Patient denies numbness, tingling in the extremities or nonhealing wounds of feet.       06/09/2023    1:23 PM 02/14/2023    1:35 PM 01/31/2023    2:25 PM 12/04/2022    1:55 PM 11/01/2022    1:15 PM  Depression screen PHQ 2/9  Decreased Interest 0 0 0 1 0  Down, Depressed, Hopeless 0 0 0 1 1  PHQ - 2 Score 0 0 0 2 1  Altered sleeping    0 1  Tired, decreased energy    0 1  Change in appetite    0 0  Feeling bad or failure about yourself     0 0  Trouble concentrating    0 1  Moving slowly or fidgety/restless    0 0  Suicidal thoughts    0 0  PHQ-9 Score  2 4  Difficult doing work/chores    Somewhat difficult       11/01/2022    1:15 PM 08/27/2022    3:24 PM 09/07/2021   11:07 AM 03/08/2021   11:05 AM  GAD 7 : Generalized Anxiety Score  Nervous, Anxious, on Edge 0 0 2 3  Control/stop worrying 1 1 2 3   Worry too much - different things 1 2 2 2   Trouble relaxing 1 2 1 1   Restless 1 2 1 1   Easily annoyed or irritable 1 2 1 3   Afraid - awful might happen 0 0 0 0  Total GAD 7 Score 5 9 9 13    Allergies  Allergen Reactions   Latex Hives   Altace [Ramipril] Cough   Codeine     Makes sick    Losartan Cough    cough   Remeron [Mirtazapine] Other (See Comments)    Cognitive changes   Tape     Blisters    Zantac [Ranitidine Hcl] Dermatitis   Social History   Social History Narrative   Lives at home with wife. Married to [name].   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    Drinks caffeine (3 cups coffee, 2 sodas per day),  Takes a daily vitamin, wears a seatbelt   Wears a bicycle helmet.    Has dentures (partial on top)   Smoke detector in the home. No firearms in the home.    Feels safe in relationships.     Right-handed.   Past Medical History:  Diagnosis Date   Achilles tendinitis of left lower extremity 07/20/2018   Adenomatous colon polyp 2011   Barrett esophagus 12/2013   EGD   CAD (coronary artery disease)    ETT/Lexiscan-Myoview (11/14):  Normal; no ischemia, EF 67%   CKD (chronic kidney disease), stage III (HCC)    Colon polyp 09/2010   adenoma   COPD (chronic obstructive pulmonary disease) (HCC) 2009   spirometry with "moderate COPD"   Depression    Diabetes mellitus without complication (HCC)    Dyspnea    GERD (gastroesophageal reflux disease)    GI bleed    after polypectomy/admitted   Hair loss 08/07/2018   HTN (hypertension)    Hypercholesterolemia    Hypomagnesemia 09/15/2022   Memory loss    Myocardial infarction (HCC)    Obesity    Pneumonia 09/15/2022   Sleep apnea    Spontaneous pneumothorax    Synovial cyst of right popliteal space 07/20/2018   Vertigo    Past Surgical History:  Procedure Laterality Date   APPENDECTOMY     CORONARY ANGIOPLASTY WITH STENT PLACEMENT  2007   LAD   PLEURAL SCARIFICATION  1987   TONSILLECTOMY     VARICOCELECTOMY     Family History  Problem Relation Age of Onset   Heart disease Mother    CVA Mother    Dementia Mother    Coronary artery disease Father 37   Heart disease Father    Stroke Father    Dementia Sister    Cancer - Other Brother    Hypertension Other    Hyperlipidemia Other    Colon polyps Sister     Cancer Sister    Allergies as of 06/09/2023       Reactions   Latex Hives   Altace [ramipril] Cough   Codeine    Makes sick   Losartan Cough   cough   Remeron [mirtazapine] Other (See Comments)   Cognitive changes   Tape  Blisters   Zantac [ranitidine Hcl] Dermatitis        Medication List        Accurate as of June 09, 2023  1:47 PM. If you have any questions, ask your nurse or doctor.          Accu-Chek Guide test strip Generic drug: glucose blood 1 each by Other route daily.   amLODipine 5 MG tablet Commonly known as: NORVASC Take 1 tablet (5 mg total) by mouth daily.   atorvastatin 80 MG tablet Commonly known as: LIPITOR Take 1 tablet (80 mg total) by mouth daily.   blood glucose meter kit and supplies Dispense based on patient and insurance preference. Use up to three times daily as directed. (FOR ICD-10 E10.9, E11.9).   buPROPion 300 MG 24 hr tablet Commonly known as: WELLBUTRIN XL Take 1 tablet (300 mg total) by mouth daily.   clopidogrel 75 MG tablet Commonly known as: PLAVIX Take 1 tablet (75 mg total) by mouth daily.   dapagliflozin propanediol 10 MG Tabs tablet Commonly known as: Farxiga Take 1 tablet (10 mg total) by mouth daily before breakfast.   famotidine 20 MG tablet Commonly known as: Pepcid Take 1 tablet (20 mg total) by mouth 2 (two) times daily.   glipiZIDE 10 MG tablet Commonly known as: GLUCOTROL Take 1 tablet (10 mg total) by mouth 2 (two) times daily before a meal. What changed:  how much to take how to take this when to take this additional instructions Changed by: Felix Pacini   magnesium chloride 64 MG Tbec SR tablet Commonly known as: SLOW-MAG Take 1 tablet (64 mg total) by mouth 2 (two) times daily. What changed: when to take this   metoprolol tartrate 50 MG tablet Commonly known as: LOPRESSOR Take 1 tablet (50 mg total) by mouth daily.   mirabegron ER 50 MG Tb24 tablet Commonly known as:  Myrbetriq Take 1 tablet (50 mg total) by mouth daily.   nitroGLYCERIN 0.4 MG SL tablet Commonly known as: NITROSTAT Place 1 tablet (0.4 mg total) under the tongue every 5 (five) minutes as needed for chest pain (3 doses max).   pantoprazole 40 MG tablet Commonly known as: PROTONIX Take 1 tablet (40 mg total) by mouth 2 (two) times daily.   PARoxetine 40 MG tablet Commonly known as: PAXIL Take 1 tablet (40 mg total) by mouth daily.   Symbicort 160-4.5 MCG/ACT inhaler Generic drug: budesonide-formoterol Inhale 2 puffs into the lungs 2 (two) times daily.   tamsulosin 0.4 MG Caps capsule Commonly known as: FLOMAX   trazodone 300 MG tablet Commonly known as: DESYREL Take 1 tablet (300 mg total) by mouth at bedtime.   VITAMIN B 12 PO Take by mouth.        All past medical history, surgical history, allergies, family history, immunizations andmedications were updated in the EMR today and reviewed under the history and medication portions of their EMR.     ROS Negative, with the exception of above mentioned in HPI  Objective:  BP 137/78   Pulse 69   Temp 98.2 F (36.8 C)   Ht 5\' 10"  (1.778 m)   Wt 211 lb 9.6 oz (96 kg)   SpO2 96%   BMI 30.36 kg/m  Body mass index is 30.36 kg/m. Physical Exam Vitals and nursing note reviewed.  Constitutional:      General: He is not in acute distress.    Appearance: Normal appearance. He is not ill-appearing, toxic-appearing or diaphoretic.  HENT:  Head: Normocephalic and atraumatic.  Eyes:     General: No scleral icterus.       Right eye: No discharge.        Left eye: No discharge.     Extraocular Movements: Extraocular movements intact.     Pupils: Pupils are equal, round, and reactive to light.  Cardiovascular:     Rate and Rhythm: Normal rate and regular rhythm.  Pulmonary:     Effort: Pulmonary effort is normal. No respiratory distress.     Breath sounds: Normal breath sounds. No wheezing, rhonchi or rales.   Musculoskeletal:     Cervical back: Neck supple.     Right lower leg: No edema.     Left lower leg: No edema.  Lymphadenopathy:     Cervical: No cervical adenopathy.  Skin:    General: Skin is warm and dry.     Coloration: Skin is not jaundiced or pale.     Findings: No rash.  Neurological:     Mental Status: He is alert and oriented to person, place, and time. Mental status is at baseline.  Psychiatric:        Mood and Affect: Mood normal.        Behavior: Behavior normal.        Thought Content: Thought content normal.        Judgment: Judgment normal.    No results found. No results found. Results for orders placed or performed in visit on 06/09/23 (from the past 24 hour(s))  POCT glycosylated hemoglobin (Hb A1C)     Status: Abnormal   Collection Time: 06/09/23  1:30 PM  Result Value Ref Range   Hemoglobin A1C 8.2 (A) 4.0 - 5.6 %   HbA1c POC (<> result, manual entry) 8.2 4.0 - 5.6 %   HbA1c, POC (prediabetic range) 8.2 (A) 5.7 - 6.4 %   HbA1c, POC (controlled diabetic range) 8.2 (A) 0.0 - 7.0 %     Assessment/Plan: JASIYAH PAULDING is a 77 y.o. male present for OV for  Recurrent major depressive disorder, in partial remission (HCC) Stable Continue paxil to 40 mg Continue Trazodone 300 qhs - Continue wellbutrin 300 QD. -Tried meds: Remeron (possible memory changes), zyprexa (arthralgias) - Follow-up 4 months  Gastroesophageal reflux disease, esophagitis presence not specified Stable Continue  Protonix 40 mg twice a day, was unable to take back secondary to recurrence of symptoms. Continue Pepcid twice daily today B12, vitamin D and mag up-to-date  Chronic bronchitis, unspecified chronic bronchitis type (HCC) Stable Continue Symbicort twice daily  Atherosclerosis of native coronary artery of native heart without angina pectoris/Essential hypertension/HYPERCHOLESTEROLEMIA/History of MI (myocardial infarction) Stage 3 chronic kidney disease/ iron deficiency  anemia/acquired thrombophilia stable - following with cards, Dr. Eden Emms. Continue atorvastatin Continue  metoprolol 50 mg Continue  amlodipine to 5 mg daily -  plavix continue per cardiology - low sodium diet and exercise encouraged.   Type 2 diabetes mellitus with hyperlipidemia/CKD 3 - A1c today 5.8--> 6.0-->6.1>>6.7> 6.5> 7.1 >7.1> 6.7> 6.8 >7.8> 7.5> 8.1 > 8.3 > 9.3> 8.2 collected today.   Goal of A1c less than 7 Continue  Farxiga 10 mg daily )medvtx) increase Glipizide 10/10 bid May need second agent, consider injectable if so. Tried: Metformin-discontinued secondary to diarrhea, [name]-patient has reservations about using since hospitalization for COVID-pneumonia and [name] occurring at the same time. Continue atorvastatin 80 - Foot exam: Completed today 08/27/2022 - eye exam: Completed 06/25/2021-patient was encouraged to schedule his eye exam. - PNA: Series completed -  Flu: Completed 08/27/2022 - Urine Microalbumin w/creat. Ratio: completed 08/27/2022  Cerebral atrophy/microvascular ischemic occlusive disease Patient is having mild cognitive changes. Referral had been placed to neurology Patient is on a statin  Iron deficiency anemia: Iron panel is up-to-date.  Patient cannot tolerate iron supplement, was encouraged to at least start Flintstone vitamin  Nocturia Encouraged him to follow-up with his urologist.   Continue Flomax prescribed by urology.  Continue Myrbetriq to 50 mg   Polyarthralgia/myalgia We discussed multiple possible causes of his arthralgia and weakness.He is seeing improvement since starting magnesium.  Tolerating mag BID. Feeling better. Strength better Patient was encouraged to hydrate well. - Comp Met (CMET) creatinine 1.44 - CK> normal at 44 - Recent vitamin D and B12 - normal Sedimentation rate-mildly elevated at 30 -Iron panel was stable - Urinalysis w microscopic + reflex cultur> WNL - ANA w/ panel, ccp, RF and uric acid WNL Pain:  tylenol for now. He can not take codeine and would  require vicoden if added coverage required.     Reviewed expectations re: course of current medical issues. Discussed self-management of symptoms. Outlined signs and symptoms indicating need for more acute intervention. Patient verbalized understanding and all questions were answered. Patient received an After-Visit Summary.    Orders Placed This Encounter  Procedures   Urine Albumin/Creatinine with ratio (send out) [LAB689]   POCT glycosylated hemoglobin (Hb A1C)   Meds ordered this encounter  Medications   amLODipine (NORVASC) 5 MG tablet    Sig: Take 1 tablet (5 mg total) by mouth daily.    Dispense:  90 tablet    Refill:  1   atorvastatin (LIPITOR) 80 MG tablet    Sig: Take 1 tablet (80 mg total) by mouth daily.    Dispense:  90 tablet    Refill:  3   buPROPion (WELLBUTRIN XL) 300 MG 24 hr tablet    Sig: Take 1 tablet (300 mg total) by mouth daily.    Dispense:  90 tablet    Refill:  1   dapagliflozin propanediol (FARXIGA) 10 MG TABS tablet    Sig: Take 1 tablet (10 mg total) by mouth daily before breakfast.    Dispense:  90 tablet    Refill:  2   famotidine (PEPCID) 20 MG tablet    Sig: Take 1 tablet (20 mg total) by mouth 2 (two) times daily.    Dispense:  180 tablet    Refill:  3   glipiZIDE (GLUCOTROL) 10 MG tablet    Sig: Take 1 tablet (10 mg total) by mouth 2 (two) times daily before a meal.    Dispense:  180 tablet    Refill:  1   magnesium chloride (SLOW-MAG) 64 MG TBEC SR tablet    Sig: Take 1 tablet (64 mg total) by mouth 2 (two) times daily.    Dispense:  60 tablet    Refill:  5   metoprolol tartrate (LOPRESSOR) 50 MG tablet    Sig: Take 1 tablet (50 mg total) by mouth daily.    Dispense:  90 tablet    Refill:  1   PARoxetine (PAXIL) 40 MG tablet    Sig: Take 1 tablet (40 mg total) by mouth daily.    Dispense:  90 tablet    Refill:  1   SYMBICORT 160-4.5 MCG/ACT inhaler    Sig: Inhale 2 puffs  into the lungs 2 (two) times daily.    Dispense:  3 each    Refill:  4   trazodone (DESYREL) 300 MG tablet    Sig: Take 1 tablet (300 mg total) by mouth at bedtime.    Dispense:  90 tablet    Refill:  1   mirabegron ER (MYRBETRIQ) 50 MG TB24 tablet    Sig: Take 1 tablet (50 mg total) by mouth daily.    Dispense:  30 tablet    Refill:  11   Referral Orders  No referral(s) requested today      Note is dictated utilizing voice recognition software. Although note has been proof read prior to signing, occasional typographical errors still can be missed. If any questions arise, please do not hesitate to call for verification.   electronically signed by:  Felix Pacini, DO  [facility] [place]

## 2023-06-12 ENCOUNTER — Encounter: Payer: Self-pay | Admitting: Family Medicine

## 2023-06-14 ENCOUNTER — Other Ambulatory Visit: Payer: Self-pay | Admitting: Cardiovascular Disease

## 2023-06-18 DIAGNOSIS — H348322 Tributary (branch) retinal vein occlusion, left eye, stable: Secondary | ICD-10-CM | POA: Diagnosis not present

## 2023-06-18 LAB — HM DIABETES EYE EXAM

## 2023-06-25 ENCOUNTER — Other Ambulatory Visit: Payer: Self-pay | Admitting: Cardiovascular Disease

## 2023-07-16 ENCOUNTER — Other Ambulatory Visit: Payer: Self-pay | Admitting: Cardiovascular Disease

## 2023-07-17 ENCOUNTER — Other Ambulatory Visit: Payer: Medicare HMO | Admitting: Pharmacist

## 2023-07-17 NOTE — Progress Notes (Signed)
07/17/2023 Name: [name] MRN: [id number] DOB: 30-Apr-1946  Chief Complaint  Patient presents with   Medication Management   Diabetes    [name] is a 77 y.o. year old male. He was outreached by the clinical pharmacist today. Spoke with patient and his wife.     They were referred to the pharmacist by a quality report due to low adherence - failed 2023 MAD (medication adherence to diabetes med). Also noted to have uncontrolled type 2 DM. Last A1c was 9.3% 02/14/2023.  Subjective:  Care Team: Primary Care Provider: Natalia Leatherwood, DO - next appt 09/22/2023  Medication Access/Adherence  Patient reports affordability concerns with their medications: Yes  - at [place] Farxiga. Patient was approved. Received his first shipment for 90 days around May 10th.  He will need refill soon.  Patient reports access/transportation concerns to their pharmacy: No  Patient reports adherence concerns with their medications:  No      Diabetes:  Current medications: Farxiga 10mg  daily and glipizide 10 mg - twice a day.   Medications tried in the past: metformin - GI side effects  Current glucose readings: 180, 243, 191, 209, 189  A1c has improved from 9.3% in March 2024 to 8.2% when checked 06/09/2023  Denies hypoglycemic s/sx including No dizziness, shakiness, sweating.  Denies hyperglycemic symptoms including polyuria, polydipsia    Medication Adherence:  Atorvastatin 80mg  refilled 90 ds 11/29/2022; 02/27/2023 and 05/29/2023. (patient's wife states he had surplus from [facility] filling in 2023) Farxiga 10mg  refilled 30 ds 01/24/2023, 02/20/2023 and 03/23/2023 - [place] thru [place] now.  Glipizide 5mg  refilled 90 day supply 02/14/2023 and 05/19/2023   Objective:  Lab Results  Component Value Date   HGBA1C 8.2 (A) 06/09/2023   HGBA1C 8.2 06/09/2023   HGBA1C 8.2 (A) 06/09/2023   HGBA1C 8.2 (A)  06/09/2023    Lab Results  Component Value Date   CREATININE 1.52 (H) 01/31/2023   BUN 19 01/31/2023   NA 137 01/31/2023   K 4.5 01/31/2023   CL 99 01/31/2023   CO2 25 01/31/2023    Lab Results  Component Value Date   CHOL 120 01/09/2022   HDL 44 01/09/2022   LDLCALC 45 01/09/2022   LDLDIRECT 58.0 11/09/2020   TRIG 262 (H) 01/09/2022   CHOLHDL 2.7 01/09/2022    Medications Reviewed Today     Reviewed by Henrene Pastor, RPH-CPP (Pharmacist) on 07/17/23 at 1143  Med List Status: <None>   Medication Order Taking? Sig Documenting Provider Last Dose Status Informant  amLODipine (NORVASC) 5 MG tablet [id number] Yes Take 1 tablet (5 mg total) by mouth daily. Kuneff, Renee A, DO Taking Active   atorvastatin (LIPITOR) 80 MG tablet [id number] Yes Take 1 tablet (80 mg total) by mouth daily. Kuneff, Renee A, DO Taking Active   blood glucose meter kit and supplies [id number] Yes Dispense based on patient and insurance preference. Use up to three times daily as directed. (FOR ICD-10 E10.9, E11.9). Kuneff, Renee A, DO Taking Active   buPROPion (WELLBUTRIN XL) 300 MG 24 hr tablet [id number] Yes Take 1 tablet (300 mg total) by mouth daily. Kuneff, Renee A, DO Taking Active   clopidogrel (PLAVIX) 75 MG tablet [id number] Yes Take 1 tablet (75 mg total) by mouth daily. Wendall Stade, MD Taking Active   Cyanocobalamin (VITAMIN B 12 PO) [id number]  Take by mouth. [provider]  Active   dapagliflozin  propanediol (FARXIGA) 10 MG TABS tablet [id number] Yes Take 1 tablet (10 mg total) by mouth daily before breakfast. Kuneff, Renee A, DO Taking Active   famotidine (PEPCID) 20 MG tablet [id number] Yes Take 1 tablet (20 mg total) by mouth 2 (two) times daily. Kuneff, Renee A, DO Taking Active   glipiZIDE (GLUCOTROL) 10 MG tablet [id number] Yes Take 1 tablet (10 mg total) by mouth 2 (two) times daily before a meal. Kuneff, Renee A, DO Taking Active   glucose blood (ACCU-CHEK GUIDE) test strip [id number]  1  each by Other route daily. Kuneff, Renee A, DO  Active   magnesium chloride (SLOW-MAG) 64 MG TBEC SR tablet [id number] Yes Take 1 tablet (64 mg total) by mouth 2 (two) times daily. Kuneff, Renee A, DO Taking Active   metoprolol tartrate (LOPRESSOR) 50 MG tablet [id number] Yes Take 1 tablet (50 mg total) by mouth daily. Kuneff, Renee A, DO Taking Active   mirabegron ER (MYRBETRIQ) 50 MG TB24 tablet [id number] Yes Take 1 tablet (50 mg total) by mouth daily. Kuneff, Renee A, DO Taking Active   nitroGLYCERIN (NITROSTAT) 0.4 MG SL tablet [id number]  Place 1 tablet (0.4 mg total) under the tongue every 5 (five) minutes as needed for chest pain (3 doses max). Wendall Stade, MD  Active   pantoprazole (PROTONIX) 40 MG tablet [id number] Yes Take 1 tablet (40 mg total) by mouth 2 (two) times daily. Kuneff, Renee A, DO Taking Active   PARoxetine (PAXIL) 40 MG tablet [id number] Yes Take 1 tablet (40 mg total) by mouth daily. Felix Pacini A, DO Taking Active   SYMBICORT 160-4.5 MCG/ACT inhaler [id number] Yes Inhale 2 puffs into the lungs 2 (two) times daily. Kuneff, Renee A, DO Taking Active   tamsulosin (FLOMAX) 0.4 MG CAPS capsule [id number] Yes  [provider] Taking Active   trazodone (DESYREL) 300 MG tablet [id number] Yes Take 1 tablet (300 mg total) by mouth at bedtime. Kuneff, Renee A, DO Taking Active               Assessment/Plan:   Diabetes: A1c not at goal - A1c has improved but not at goal - initial goal is to get A1c < 8.0%;  - Reviewed goal A1c, goal fasting, and goal 2 hour post prandial glucose - Continue checking blood glucose 1 to 2 times daily at varying times of day and record.  - Recommend to continue current medications for diabetes - glipizide 10mg  twice a day and Farxiga 10mg  daily  - [place]ved [place]xiga form [place] thru 11/25/2023 - called [place] [employer] [place] to request ref[place]. Should be shipped to patient's home in the next 7 to 10 days.  - Encouraged increase in physical  activity as able.  - Reviewed dietary recommendations for lowering blood glucose:  Increase non-starchy vegetables - carrots, green bean, squash, zucchini, tomatoes, onions, peppers, spinach and other green leafy vegetables, cabbage, lettuce, cucumbers, asparagus, okra (not fried), eggplant Limit sugar and processed foods (cakes, cookies, ice cream, crackers and chips) Increase fresh fruit but limit serving sizes 1/2 cup or about the size of tennis or baseball Limit red meat to no more than 1-2 times per week (serving size about the size of your palm) Choose whole grains / lean proteins - whole wheat bread, quinoa, whole grain rice (1/2 cup), fish, chicken, [place] Avoid sugar and calorie containing beverages - soda, sweet tea and juice.  Choose water or unsweetened tea instead.    Medication Management: Adherence has  improved for 2024 per refill history - reviewed medication list and updated - Reviewed refill history and discussed adherence and addressed adherence barriers - continue to use medication boxes to improve adherence.   Follow Up Plan: October 2024 with PCP.  Clinical Pharmacist Practitioner will check back with patient in November or December 2024 to reapply for Farxiga medication assistance program if needed for 2025.    Henrene Pastor, PharmD Clinical Pharmacist [facility]  Population Health  [phone]

## 2023-07-25 ENCOUNTER — Other Ambulatory Visit: Payer: Self-pay | Admitting: Cardiovascular Disease

## 2023-07-29 ENCOUNTER — Other Ambulatory Visit: Payer: Self-pay | Admitting: Cardiovascular Disease

## 2023-07-31 ENCOUNTER — Telehealth: Payer: Self-pay | Admitting: Cardiovascular Disease

## 2023-07-31 NOTE — Telephone Encounter (Signed)
*  STAT* If patient is at the pharmacy, call can be transferred to refill team.   1. Which medications need to be refilled? (please list name of each medication and dose if known) clopidogrel (PLAVIX) 75 MG tablet    2. Would you like to learn more about the convenience, safety, & potential cost savings by using the [facility]? No     3. Are you open to using the [facility] (Type [facility]. No ).   4. Which pharmacy/location (including street and city if local pharmacy) is medication to be sent to? [id number] - [place], [place] - [address]    5. Do they need a 30 day or 90 day supply? 90

## 2023-08-01 MED ORDER — CLOPIDOGREL BISULFATE 75 MG PO TABS: 75.0000 mg | ORAL_TABLET | Freq: Every day | ORAL | 1 refills | Status: DC

## 2023-08-01 NOTE — Telephone Encounter (Signed)
Pt has not been seen since 03/2022. Pt has a upcoming appt with Dr. Eden Emms in 11/2023. Does pt need to be seen sooner or does Dr. Eden Emms want to refill his clopidogrel until pt's appt in 11/2023? Please address

## 2023-08-01 NOTE — Telephone Encounter (Signed)
Since patient has appointment in January will refill Plavix to get patient through to his appointment.

## 2023-08-04 ENCOUNTER — Telehealth: Payer: Self-pay | Admitting: Family Medicine

## 2023-08-04 NOTE — Telephone Encounter (Signed)
Prescription Request  08/04/2023  LOV: 06/09/2023  What is the name of the medication or equipment?   glipiZIDE (GLUCOTROL) 10 MG tablet  Patient reports that he ran out of medication due to the instructions changing mid way before rx refill. He is completely out of medication and is needing a temp refill until it can be refilled on 09/18 [facility].   Have you contacted your pharmacy to request a refill? Yes   Which pharmacy would you like this sent to?  [id number] - [place], [place] - [address] [place] [address] Phone: [phone] Fax: [phone]  [facility] - [place], [place] - [address] [place] [address] Phone: [phone] Fax: [phone]  [facility] - [place], [place] - [address] [place] [address] Phone: [phone] Fax: [phone]    Patient notified that their request is being sent to the clinical staff for review and that they should receive a response within 2 business days.   Please advise at Mobile [phone] (mobile)

## 2023-08-04 NOTE — Telephone Encounter (Signed)
LM for pt to return call to discuss. Pharmacy filling

## 2023-08-08 NOTE — Telephone Encounter (Signed)
LM for pt to return call to discuss.  

## 2023-08-08 NOTE — Telephone Encounter (Signed)
Please send [name] refills to [facility] if pt is still requiring pt assistance for 2025.

## 2023-08-11 NOTE — Telephone Encounter (Signed)
Spoke with patient regarding results/recommendations.   Does pt need patient assistance for farxiga Rx

## 2023-08-21 ENCOUNTER — Other Ambulatory Visit: Payer: Self-pay | Admitting: Family Medicine

## 2023-08-22 ENCOUNTER — Other Ambulatory Visit: Payer: Self-pay

## 2023-08-22 NOTE — Telephone Encounter (Signed)
RF request for tamsulosin (FLOMAX) 0.4 MG CAPS capsule  LOV: 06/09/23 Next ov: 09/22/23 Last written: 09/28/20, historical provider

## 2023-09-02 DIAGNOSIS — R3915 Urgency of urination: Secondary | ICD-10-CM | POA: Diagnosis not present

## 2023-09-02 DIAGNOSIS — R351 Nocturia: Secondary | ICD-10-CM | POA: Diagnosis not present

## 2023-09-02 DIAGNOSIS — R3914 Feeling of incomplete bladder emptying: Secondary | ICD-10-CM | POA: Diagnosis not present

## 2023-09-02 DIAGNOSIS — N401 Enlarged prostate with lower urinary tract symptoms: Secondary | ICD-10-CM | POA: Diagnosis not present

## 2023-09-02 DIAGNOSIS — R350 Frequency of micturition: Secondary | ICD-10-CM | POA: Diagnosis not present

## 2023-09-22 ENCOUNTER — Encounter: Payer: Self-pay | Admitting: Family Medicine

## 2023-09-22 ENCOUNTER — Ambulatory Visit (INDEPENDENT_AMBULATORY_CARE_PROVIDER_SITE_OTHER): Payer: Medicare HMO | Admitting: Family Medicine

## 2023-09-22 VITALS — BP 118/68 | HR 75 | Temp 98.2°F | Wt 212.4 lb

## 2023-09-22 DIAGNOSIS — I252 Old myocardial infarction: Secondary | ICD-10-CM

## 2023-09-22 DIAGNOSIS — Z79899 Other long term (current) drug therapy: Secondary | ICD-10-CM

## 2023-09-22 DIAGNOSIS — Z23 Encounter for immunization: Secondary | ICD-10-CM

## 2023-09-22 DIAGNOSIS — I2510 Atherosclerotic heart disease of native coronary artery without angina pectoris: Secondary | ICD-10-CM | POA: Diagnosis not present

## 2023-09-22 DIAGNOSIS — Z5181 Encounter for therapeutic drug level monitoring: Secondary | ICD-10-CM | POA: Diagnosis not present

## 2023-09-22 DIAGNOSIS — I10 Essential (primary) hypertension: Secondary | ICD-10-CM | POA: Diagnosis not present

## 2023-09-22 DIAGNOSIS — F330 Major depressive disorder, recurrent, mild: Secondary | ICD-10-CM | POA: Diagnosis not present

## 2023-09-22 DIAGNOSIS — E785 Hyperlipidemia, unspecified: Secondary | ICD-10-CM

## 2023-09-22 DIAGNOSIS — N138 Other obstructive and reflux uropathy: Secondary | ICD-10-CM

## 2023-09-22 DIAGNOSIS — E1169 Type 2 diabetes mellitus with other specified complication: Secondary | ICD-10-CM

## 2023-09-22 DIAGNOSIS — N401 Enlarged prostate with lower urinary tract symptoms: Secondary | ICD-10-CM

## 2023-09-22 DIAGNOSIS — K2100 Gastro-esophageal reflux disease with esophagitis, without bleeding: Secondary | ICD-10-CM | POA: Diagnosis not present

## 2023-09-22 LAB — POCT GLYCOSYLATED HEMOGLOBIN (HGB A1C)
HbA1c POC (<> result, manual entry): 8.6 % (ref 4.0–5.6)
HbA1c, POC (controlled diabetic range): 8.6 % — AB (ref 0.0–7.0)
HbA1c, POC (prediabetic range): 8.6 % — AB (ref 5.7–6.4)
Hemoglobin A1C: 8.6 % — AB (ref 4.0–5.6)

## 2023-09-22 MED ORDER — BUPROPION HCL ER (XL) 300 MG PO TB24: 300.0000 mg | ORAL_TABLET | Freq: Every day | ORAL | 1 refills | Status: DC

## 2023-09-22 MED ORDER — PAROXETINE HCL 40 MG PO TABS: 40.0000 mg | ORAL_TABLET | Freq: Every day | ORAL | 1 refills | Status: DC

## 2023-09-22 MED ORDER — GLIPIZIDE 10 MG PO TABS: 10.0000 mg | ORAL_TABLET | Freq: Two times a day (BID) | ORAL | 1 refills | Status: DC

## 2023-09-22 MED ORDER — DAPAGLIFLOZIN PROPANEDIOL 10 MG PO TABS: 10.0000 mg | ORAL_TABLET | Freq: Every day | ORAL | 2 refills | Status: DC

## 2023-09-22 MED ORDER — MAGNESIUM CHLORIDE 64 MG PO TBEC: 1.0000 | DELAYED_RELEASE_TABLET | Freq: Two times a day (BID) | ORAL | 5 refills | Status: DC

## 2023-09-22 MED ORDER — METOPROLOL TARTRATE 50 MG PO TABS: 50.0000 mg | ORAL_TABLET | Freq: Every day | ORAL | 1 refills | Status: DC

## 2023-09-22 MED ORDER — FAMOTIDINE 20 MG PO TABS: 20.0000 mg | ORAL_TABLET | Freq: Two times a day (BID) | ORAL | 3 refills | Status: DC

## 2023-09-22 MED ORDER — AMLODIPINE BESYLATE 5 MG PO TABS: 5.0000 mg | ORAL_TABLET | Freq: Every day | ORAL | 1 refills | Status: DC

## 2023-09-22 MED ORDER — SYMBICORT 160-4.5 MCG/ACT IN AERO: 2.0000 | INHALATION_SPRAY | Freq: Two times a day (BID) | RESPIRATORY_TRACT | 4 refills | Status: DC

## 2023-09-22 MED ORDER — PANTOPRAZOLE SODIUM 40 MG PO TBEC: 40.0000 mg | DELAYED_RELEASE_TABLET | Freq: Two times a day (BID) | ORAL | 3 refills | Status: DC

## 2023-09-22 MED ORDER — TRAZODONE HCL 300 MG PO TABS: 300.0000 mg | ORAL_TABLET | Freq: Every day | ORAL | 1 refills | Status: DC

## 2023-09-22 MED ORDER — TIRZEPATIDE 2.5 MG/0.5ML ~~LOC~~ SOAJ: 2.5000 mg | SUBCUTANEOUS | 3 refills | Status: DC

## 2023-09-22 NOTE — Patient Instructions (Signed)

## 2023-09-22 NOTE — Progress Notes (Signed)
[name] , Dec 28, 1945, 77 y.o., male MRN: [id number] Patient Care Team    Relationship Specialty Notifications Start End  Natalia Leatherwood, DO PCP - General Family Medicine  12/25/15   Wendall Stade, MD PCP - Cardiology Cardiology Admissions 08/20/18   Johney Maine, MD Consulting Physician Hematology  09/03/16   Wendall Stade, MD Consulting Physician Cardiology  09/03/16   Charlott Rakes, MD Consulting Physician Gastroenterology  09/03/16   Ihor Gully, MD (Inactive) Consulting Physician Urology  09/10/16   Hollar, Ronal Fear, MD Referring Physician Dermatology  06/29/19   Jill Side, OD Referring Physician   01/31/23     Chief Complaint  Patient presents with   Diabetes     Subjective: Pt presents for an OV for follow-up  Chronic Conditions/illness Management  Recurrent major depressive disorder, in partial remission (HCC)/memory changes He reports compliance with trazodone 200 mg nightly, Paxil 40 mg daily and Wellbutrin 300 mg daily. Zyprexa discontinued secondary to arthralgias. Prior note: Patient is present with his wife today. They report he is feeling better. He has not started the Flintstone vitamin. He is tolerating the magnesium supplement.He has more energy and less pain.  He stopped the zyprexa and started the higher dose trazodone 200 mg QHS. He is still not sleeping great, but ok for him.    Gastroesophageal reflux disease, esophagitis presence not specified/diarrhea/bloating  EGD 03/2016 by Dr. Bosie Clos with signs of barrett's esophagus. Patient was tried on lower dose medication, and was unable to tolerate secondary to return of symptoms.   His symptoms are well-controlled with protonix 40 BID and pepcid BID.    COPD [facility]) He uses Symbicort daily and feels it is working well for him.   Atherosclerosis of native coronary artery of native heart without angina pectoris/Essential hypertension/HYPERCHOLESTEROLEMIA/History of MI  (myocardial infarction)/IDA He has known CAD with prior LAD stent from 2007. Pt reports compliance with metoprolol 50 mg daily and amlodipine 5 mg daily Qd, losartan 100 mg daily.  Patient denies chest pain, shortness of breath, dizziness or lower extremity edema.   Pt takes a daily Plavix. Pt is  prescribed statin and followed by Dr. Eden Emms Cardiology.  Diet: Low-sodium RF: Hypertension, hyperlipidemia, diabetes  Type 2 diabetes mellitus without complication, without long-term current use of insulin (HCC) patient reports compliance with Farxiga 10 mg daily and glipizide 5 mg daily.   He had diarrhea  off metformin trial as well.  We discontinued metformin regardless, to not cause the potential of worsening diarrhea.   Patient denies dizziness, hyperglycemic or hypoglycemic events. Patient denies numbness, tingling in the extremities or nonhealing wounds of feet.      09/22/2023    2:00 PM 06/09/2023    1:23 PM 02/14/2023    1:35 PM 01/31/2023    2:25 PM 12/04/2022    1:55 PM  Depression screen PHQ 2/9  Decreased Interest 2 0 0 0 1  Down, Depressed, Hopeless 1 1 0 0 1  PHQ - 2 Score 3 1 0 0 2  Altered sleeping 1 1   0  Tired, decreased energy 0 0   0  Change in appetite 1 1   0  Feeling bad or failure about yourself  1 0   0  Trouble concentrating 1 0   0  Moving slowly or fidgety/restless 1 0   0  Suicidal thoughts 0 0   0  PHQ-9 Score 8 3   2   Difficult  doing work/chores Not difficult at all    Somewhat difficult      09/22/2023    2:01 PM 06/09/2023    2:05 PM 11/01/2022    1:15 PM 08/27/2022    3:24 PM  GAD 7 : Generalized Anxiety Score  Nervous, Anxious, on Edge 0 0 0 0  Control/stop worrying 1 0 1 1  Worry too much - different things 1 1 1 2   Trouble relaxing 0 1 1 2   Restless 0 0 1 2  Easily annoyed or irritable 0  1 2  Afraid - awful might happen 0 0 0 0  Total GAD 7 Score 2  5 9   Anxiety Difficulty Not difficult at all      Allergies  Allergen Reactions   Latex  Hives   Altace [Ramipril] Cough   Codeine     Makes sick   Losartan Cough    cough   Remeron [Mirtazapine] Other (See Comments)    Cognitive changes   Tape     Blisters    Zantac [Ranitidine Hcl] Dermatitis   Social History   Social History Narrative   Lives at home with wife. Married to [name].   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    Drinks caffeine (3 cups coffee, 2 sodas per day),  Takes a daily vitamin, wears a seatbelt   Wears a bicycle helmet.    Has dentures (partial on top)   Smoke detector in the home. No firearms in the home.    Feels safe in relationships.     Right-handed.   Past Medical History:  Diagnosis Date   Achilles tendinitis of left lower extremity 07/20/2018   Adenomatous colon polyp 2011   Barrett esophagus 12/2013   EGD   CAD (coronary artery disease)    ETT/Lexiscan-Myoview (11/14):  Normal; no ischemia, EF 67%   CKD (chronic kidney disease), stage III (HCC)    Colon polyp 09/2010   adenoma   COPD (chronic obstructive pulmonary disease) (HCC) 2009   spirometry with "moderate COPD"   Depression    Diabetes mellitus without complication (HCC)    Dyspnea    GERD (gastroesophageal reflux disease)    GI bleed    after polypectomy/admitted   Hair loss 08/07/2018   HTN (hypertension)    Hypercholesterolemia    Hypomagnesemia 09/15/2022   Memory loss    Myocardial infarction (HCC)    Obesity    Pneumonia 09/15/2022   Sleep apnea    Spontaneous pneumothorax    Synovial cyst of right popliteal space 07/20/2018   Vertigo    Past Surgical History:  Procedure Laterality Date   APPENDECTOMY     CORONARY ANGIOPLASTY WITH STENT PLACEMENT  2007   LAD   PLEURAL SCARIFICATION  1987   TONSILLECTOMY     VARICOCELECTOMY     Family History  Problem Relation Age of Onset   Heart disease Mother    CVA Mother    Dementia Mother    Coronary artery disease Father 37   Heart disease Father    Stroke Father    Dementia Sister    Cancer - Other Brother     Hypertension Other    Hyperlipidemia Other    Colon polyps Sister    Cancer Sister    Allergies as of 09/22/2023       Reactions   Latex Hives   Altace [ramipril] Cough   Codeine    Makes sick   Losartan Cough   cough   Remeron [  mirtazapine] Other (See Comments)   Cognitive changes   Tape    Blisters   Zantac [ranitidine Hcl] Dermatitis        Medication List        Accurate as of September 22, 2023  4:02 PM. If you have any questions, ask your nurse or doctor.          STOP taking these medications    Mag64 64 MG Tbec Generic drug: Magnesium Chloride Stopped by: Felix Pacini       TAKE these medications    Accu-Chek Guide test strip Generic drug: glucose blood 1 each by Other route daily.   amLODipine 5 MG tablet Commonly known as: NORVASC Take 1 tablet (5 mg total) by mouth daily.   atorvastatin 80 MG tablet Commonly known as: LIPITOR Take 1 tablet (80 mg total) by mouth daily.   blood glucose meter kit and supplies Dispense based on patient and insurance preference. Use up to three times daily as directed. (FOR ICD-10 E10.9, E11.9).   buPROPion 300 MG 24 hr tablet Commonly known as: WELLBUTRIN XL Take 1 tablet (300 mg total) by mouth daily.   clopidogrel 75 MG tablet Commonly known as: PLAVIX Take 1 tablet (75 mg total) by mouth daily.   dapagliflozin propanediol 10 MG Tabs tablet Commonly known as: Farxiga Take 1 tablet (10 mg total) by mouth daily before breakfast.   famotidine 20 MG tablet Commonly known as: Pepcid Take 1 tablet (20 mg total) by mouth 2 (two) times daily.   finasteride 5 MG tablet Commonly known as: PROSCAR Take 5 mg by mouth daily.   glipiZIDE 10 MG tablet Commonly known as: GLUCOTROL Take 1 tablet (10 mg total) by mouth 2 (two) times daily before a meal.   magnesium chloride 64 MG Tbec SR tablet Commonly known as: SLOW-MAG Take 1 tablet (64 mg total) by mouth 2 (two) times daily.   metoprolol tartrate 50 MG  tablet Commonly known as: LOPRESSOR Take 1 tablet (50 mg total) by mouth daily.   mirabegron ER 50 MG Tb24 tablet Commonly known as: Myrbetriq Take 1 tablet (50 mg total) by mouth daily.   nitroGLYCERIN 0.4 MG SL tablet Commonly known as: NITROSTAT Place 1 tablet (0.4 mg total) under the tongue every 5 (five) minutes as needed for chest pain (3 doses max).   pantoprazole 40 MG tablet Commonly known as: PROTONIX Take 1 tablet (40 mg total) by mouth 2 (two) times daily.   PARoxetine 40 MG tablet Commonly known as: PAXIL Take 1 tablet (40 mg total) by mouth daily.   Symbicort 160-4.5 MCG/ACT inhaler Generic drug: budesonide-formoterol Inhale 2 puffs into the lungs 2 (two) times daily.   tamsulosin 0.4 MG Caps capsule Commonly known as: FLOMAX   tirzepatide 2.5 MG/0.5ML Pen Commonly known as: MOUNJARO Inject 2.5 mg into the skin once a week. Started by: Felix Pacini   trazodone 300 MG tablet Commonly known as: DESYREL Take 1 tablet (300 mg total) by mouth at bedtime.   VITAMIN B 12 PO Take by mouth.        All past medical history, surgical history, allergies, family history, immunizations andmedications were updated in the EMR today and reviewed under the history and medication portions of their EMR.     ROS Negative, with the exception of above mentioned in HPI  Objective:  BP 118/68   Pulse 75   Temp 98.2 F (36.8 C)   Wt 212 lb 6.4 oz (96.3 kg)  SpO2 95%   BMI 30.48 kg/m  Body mass index is 30.48 kg/m. Physical Exam Vitals and nursing note reviewed.  Constitutional:      General: He is not in acute distress.    Appearance: Normal appearance. He is not ill-appearing, toxic-appearing or diaphoretic.  HENT:     Head: Normocephalic and atraumatic.  Eyes:     General: No scleral icterus.       Right eye: No discharge.        Left eye: No discharge.     Extraocular Movements: Extraocular movements intact.     Pupils: Pupils are equal, round, and  reactive to light.  Cardiovascular:     Rate and Rhythm: Normal rate and regular rhythm.  Pulmonary:     Effort: Pulmonary effort is normal. No respiratory distress.     Breath sounds: Normal breath sounds. No wheezing, rhonchi or rales.  Musculoskeletal:     Cervical back: Neck supple.     Right lower leg: No edema.     Left lower leg: No edema.  Lymphadenopathy:     Cervical: No cervical adenopathy.  Skin:    General: Skin is warm and dry.     Coloration: Skin is not jaundiced or pale.     Findings: No rash.  Neurological:     Mental Status: He is alert and oriented to person, place, and time. Mental status is at baseline.  Psychiatric:        Mood and Affect: Mood normal.        Behavior: Behavior normal.        Thought Content: Thought content normal.        Judgment: Judgment normal.     Diabetic Foot Exam - Simple   Simple Foot Form Diabetic Foot exam was performed with the following findings: Yes 09/22/2023  1:55 PM  Visual Inspection No deformities, no ulcerations, no other skin breakdown bilaterally: Yes Sensation Testing Intact to touch and monofilament testing bilaterally: Yes Pulse Check Posterior Tibialis and Dorsalis pulse intact bilaterally: Yes Comments     No results found. No results found. Results for orders placed or performed in visit on 09/22/23 (from the past 24 hour(s))  POCT glycosylated hemoglobin (Hb A1C)     Status: Abnormal   Collection Time: 09/22/23  1:55 PM  Result Value Ref Range   Hemoglobin A1C 8.6 (A) 4.0 - 5.6 %   HbA1c POC (<> result, manual entry) 8.6 4.0 - 5.6 %   HbA1c, POC (prediabetic range) 8.6 (A) 5.7 - 6.4 %   HbA1c, POC (controlled diabetic range) 8.6 (A) 0.0 - 7.0 %      Assessment/Plan: ABDULAH SWILLEY is a 77 y.o. male present for OV for  Recurrent major depressive disorder, in partial remission (HCC) Stable Continue paxil to 40 mg Continue trazodone 300 qhs - Continue wellbutrin 300 QD. -Tried meds: Remeron  (possible memory changes), zyprexa (arthralgias) - Follow-up 4 months  Gastroesophageal reflux disease, esophagitis presence not specified Stable Continue Protonix 40 mg twice a day, was unable to take back secondary to recurrence of symptoms. Continue Pepcid twice daily today B12, vitamin D and mag up-to-date  Chronic bronchitis, unspecified chronic bronchitis type (HCC) Stable Continue Symbicort twice daily  Atherosclerosis of native coronary artery of native heart without angina pectoris/Essential hypertension/HYPERCHOLESTEROLEMIA/History of MI (myocardial infarction) Stage 3 chronic kidney disease/ iron deficiency anemia/acquired thrombophilia Stable - following with cards, Dr. Eden Emms. Continue atorvastatin Continue metoprolol 50 mg New amlodipine to 5 mg daily -  plavix continue per cardiology - low sodium diet and exercise encouraged.   Type 2 diabetes mellitus with hyperlipidemia/CKD 3 - A1c today 5.8--> 6.0-->6.1>>6.7> 6.5> 7.1 >7.1> 6.7> 6.8 >7.8> 7.5> 8.1 > 8.3 > 9.3> 8.2 > 8.6 Goal of A1c less than 7 Continue Farxiga 10 mg daily (medvtx) Continue glipizide 10/10 bid Start mounjaro 2.5 mg weekly.  If approved and able to taper up on medication will start to decrease on glipizide ideally.  Patient was encouraged to make a nurse visit when he picks up medication, for proper tutorial on injection technique. Tried: Metformin-discontinued secondary to diarrhea, [name]-patient has reservations about using since hospitalization for COVID-pneumonia and [name] occurring at the same time. Continue atorvastatin 80 - Foot exam: Completed 09/22/2023 - eye exam: Completed 05/2023 - PNA: Series completed - Flu: Completed 09/22/2023 - Urine Microalbumin w/creat. Ratio: completed 06/09/2023  Cerebral atrophy/microvascular ischemic occlusive disease Patient is having mild cognitive changes. Referral had been placed to neurology Patient is on a statin  Iron deficiency  anemia: Iron panel is up-to-date.  Patient cannot tolerate iron supplement, was encouraged to at least start Flintstone vitamin  Nocturia Encouraged him to follow-up with his urologist.   Continue Flomax prescribed by urology.  Continue Myrbetriq to 50 mg   Polyarthralgia/myalgia We discussed multiple possible causes of his arthralgia and weakness.He is seeing improvement since starting magnesium.  Tolerating mag BID. Feeling better. Strength better Patient was encouraged to hydrate well. - Comp Met (CMET) creatinine 1.44 - CK> normal at 44 - Recent vitamin D and B12 - normal Sedimentation rate-mildly elevated at 30 -Iron panel was stable - Urinalysis w microscopic + reflex cultur> WNL - ANA w/ panel, ccp, RF and uric acid WNL Pain: tylenol for now. He can not take codeine and would  require vicoden if added coverage required.    Influenza vaccine administered today.  Reviewed expectations re: course of current medical issues. Discussed self-management of symptoms. Outlined signs and symptoms indicating need for more acute intervention. Patient verbalized understanding and all questions were answered. Patient received an After-Visit Summary.    Orders Placed This Encounter  Procedures   Flu Vaccine Trivalent High Dose (Fluad)   Comp Met (CMET)   Direct LDL   TSH   CBC   POCT glycosylated hemoglobin (Hb A1C)   Meds ordered this encounter  Medications   amLODipine (NORVASC) 5 MG tablet    Sig: Take 1 tablet (5 mg total) by mouth daily.    Dispense:  90 tablet    Refill:  1   buPROPion (WELLBUTRIN XL) 300 MG 24 hr tablet    Sig: Take 1 tablet (300 mg total) by mouth daily.    Dispense:  90 tablet    Refill:  1   dapagliflozin propanediol (FARXIGA) 10 MG TABS tablet    Sig: Take 1 tablet (10 mg total) by mouth daily before breakfast.    Dispense:  90 tablet    Refill:  2   famotidine (PEPCID) 20 MG tablet    Sig: Take 1 tablet (20 mg total) by mouth 2 (two) times  daily.    Dispense:  180 tablet    Refill:  3   glipiZIDE (GLUCOTROL) 10 MG tablet    Sig: Take 1 tablet (10 mg total) by mouth 2 (two) times daily before a meal.    Dispense:  180 tablet    Refill:  1   magnesium chloride (SLOW-MAG) 64 MG TBEC SR tablet    Sig: Take 1  tablet (64 mg total) by mouth 2 (two) times daily.    Dispense:  60 tablet    Refill:  5   metoprolol tartrate (LOPRESSOR) 50 MG tablet    Sig: Take 1 tablet (50 mg total) by mouth daily.    Dispense:  90 tablet    Refill:  1   pantoprazole (PROTONIX) 40 MG tablet    Sig: Take 1 tablet (40 mg total) by mouth 2 (two) times daily.    Dispense:  180 tablet    Refill:  3   PARoxetine (PAXIL) 40 MG tablet    Sig: Take 1 tablet (40 mg total) by mouth daily.    Dispense:  90 tablet    Refill:  1   SYMBICORT 160-4.5 MCG/ACT inhaler    Sig: Inhale 2 puffs into the lungs 2 (two) times daily.    Dispense:  3 each    Refill:  4   trazodone (DESYREL) 300 MG tablet    Sig: Take 1 tablet (300 mg total) by mouth at bedtime.    Dispense:  90 tablet    Refill:  1   tirzepatide (MOUNJARO) 2.5 MG/0.5ML Pen    Sig: Inject 2.5 mg into the skin once a week.    Dispense:  2 mL    Refill:  3   Referral Orders  No referral(s) requested today      Note is dictated utilizing voice recognition software. Although note has been proof read prior to signing, occasional typographical errors still can be missed. If any questions arise, please do not hesitate to call for verification.   electronically signed by:  Felix Pacini, DO  [facility] [place]

## 2023-09-23 LAB — CBC
HCT: 39.1 % (ref 39.0–52.0)
Hemoglobin: 12.6 g/dL — ABNORMAL LOW (ref 13.0–17.0)
MCHC: 32.3 g/dL (ref 30.0–36.0)
MCV: 84.2 fL (ref 78.0–100.0)
Platelets: 205 10*3/uL (ref 150.0–400.0)
RBC: 4.64 Mil/uL (ref 4.22–5.81)
RDW: 15.5 % (ref 11.5–15.5)
WBC: 4.2 10*3/uL (ref 4.0–10.5)

## 2023-09-23 LAB — COMPREHENSIVE METABOLIC PANEL
ALT: 23 U/L (ref 0–53)
AST: 16 U/L (ref 0–37)
Albumin: 4.2 g/dL (ref 3.5–5.2)
Alkaline Phosphatase: 75 U/L (ref 39–117)
BUN: 24 mg/dL — ABNORMAL HIGH (ref 6–23)
CO2: 26 meq/L (ref 19–32)
Calcium: 9.5 mg/dL (ref 8.4–10.5)
Chloride: 100 meq/L (ref 96–112)
Creatinine, Ser: 1.49 mg/dL (ref 0.40–1.50)
GFR: 45.48 mL/min — ABNORMAL LOW (ref >60.00)
Glucose, Bld: 298 mg/dL — ABNORMAL HIGH (ref 70–99)
Potassium: 4.5 meq/L (ref 3.5–5.1)
Sodium: 136 meq/L (ref 135–145)
Total Bilirubin: 0.6 mg/dL (ref 0.2–1.2)
Total Protein: 6.7 g/dL (ref 6.0–8.3)

## 2023-09-23 LAB — TSH: TSH: 1.62 u[IU]/mL (ref 0.35–5.50)

## 2023-09-23 LAB — LDL CHOLESTEROL, DIRECT: Direct LDL: 57 mg/dL

## 2023-09-26 ENCOUNTER — Telehealth: Payer: Self-pay | Admitting: Pharmacist

## 2023-09-26 NOTE — Telephone Encounter (Signed)
Patient's wife call Clinical Pharmacist in regards to the cost of medication for diabetes that was added to his regimen l10/28/24.  She did not leave details but I suspect cost if high for [facility] since patient was already in Medicare coverage gap. Would like to discuss options for 2024 and also if taking Mounjaro in 2025 would be feasible.  LM on VM of patient's wife. CB# [phone]

## 2023-09-26 NOTE — Telephone Encounter (Signed)
Patient's wife called back. Discussed cost of Mounjaro - $200+ for 28 days. Currently there is not medication assistance program for [facility]. She reports she thinks they can afford $200 for November and December.  In 2025 he is going to stay with his current [facility] plan. .  Discussed that fo r2025 this plan will have $250 frug deductible. Cost of meds might be high in Jan and Feb but should be lower cost at $47 per month for branded meds for March and April and then he will likely reach $2000 out of pocket max and cost will be $0 for all formulary meds thru December 2025.

## 2023-09-29 ENCOUNTER — Ambulatory Visit: Payer: Medicare HMO

## 2023-09-29 DIAGNOSIS — Z7984 Long term (current) use of oral hypoglycemic drugs: Secondary | ICD-10-CM

## 2023-09-29 DIAGNOSIS — E785 Hyperlipidemia, unspecified: Secondary | ICD-10-CM

## 2023-09-29 DIAGNOSIS — Z7985 Long-term (current) use of injectable non-insulin antidiabetic drugs: Secondary | ICD-10-CM | POA: Diagnosis not present

## 2023-09-29 DIAGNOSIS — E1169 Type 2 diabetes mellitus with other specified complication: Secondary | ICD-10-CM | POA: Diagnosis not present

## 2023-09-29 NOTE — Progress Notes (Signed)
Patient came in today for [facility] teaching. It was demonstrated to patient with training pen:   Patient instructed to clean area with alcohol swab in circular motion Attach needle and prime with first use of pen Turn dial until seeing the dosage prescribed. Then remove caps. Hold pen up to subcutaneous area (preferably back of thigh) & push button the bottom of the pen. When he see the number back to 0, he should hold pen in place for 10 additional seconds. Then injection is done & he can discard needle in sharps container/milk jug.   Pt states understanding, 1st dose given in office today. If he has further questions, he will call office.

## 2023-09-30 ENCOUNTER — Other Ambulatory Visit: Payer: Self-pay | Admitting: Family Medicine

## 2023-10-02 ENCOUNTER — Other Ambulatory Visit: Payer: Medicare HMO | Admitting: Pharmacist

## 2023-10-02 NOTE — Progress Notes (Signed)
10/02/2023 Name: [name] MRN: [id number] DOB: [birth year]  Chief Complaint  Patient presents with   Medication Management    [name] is a 77 y.o. year old male. He was outreached by the clinical pharmacist today. Spoke with patient and his wife.     They were referred to the pharmacist by a quality report due to low adherence - failed 2023 MAD (medication adherence to diabetes med). Also noted to have uncontrolled type 2 DM. Last A1c was 8.6% - slightly improved compare to March 2024 but not at goal yet. Patient has started [facility] 2.5mg  weekly - first dose was 07/30/2023  Subjective:  Care Team: Primary Care Provider: Natalia Leatherwood, DO - next appt: 12/22/2023 Cardiologist: Dr Eden Emms, MD - next app: 12/10/2023  Medication Access/Adherence  Patient reports affordability concerns with their medications: Yes  - cost of Mounjaro was > $200 but wife reports they plan to pay for this for the next 2 months until Medicare benefits restart in 2025.  [place] for [place]. His last shipment was 08/2023.  Patient reports access/transportation concerns to their pharmacy: No  Patient reports adherence concerns with their medications:  No      Diabetes:  Current medications: Farxiga 10mg  daily, Mounjaro 2.5mg  weekly and glipizide 10 mg - twice a day.   Medications tried in the past: metformin - GI side effects  Current glucose readings: 150 to 200's  Denies hypoglycemic s/sx including No dizziness, shakiness, sweating.  Denies hyperglycemic symptoms including polyuria,  Denies nausea since starting [facility] 09/29/23   Medication Adherence:  Atorvastatin 80mg  refilled 90 ds 11/29/2022; 02/27/2023; 05/29/2023 and 08/24/2023  Farxiga 10mg  refilled 30 ds 01/24/2023, 02/20/2023 and 03/23/2023 - [place].  Glipizide 5mg  refilled 90 day supply 02/14/2023, 05/19/2023 and 08/04/2023   Objective:  Lab  Results  Component Value Date   HGBA1C 8.6 (A) 09/22/2023   HGBA1C 8.6 09/22/2023   HGBA1C 8.6 (A) 09/22/2023   HGBA1C 8.6 (A) 09/22/2023    Lab Results  Component Value Date   CREATININE 1.49 09/22/2023   BUN 24 (H) 09/22/2023   NA 136 09/22/2023   K 4.5 09/22/2023   CL 100 09/22/2023   CO2 26 09/22/2023    Lab Results  Component Value Date   CHOL 120 01/09/2022   HDL 44 01/09/2022   LDLCALC 45 01/09/2022   LDLDIRECT 57.0 09/22/2023   TRIG 262 (H) 01/09/2022   CHOLHDL 2.7 01/09/2022    Medications Reviewed Today     Reviewed by Henrene Pastor, RPH-CPP (Pharmacist) on 10/02/23 at 1152  Med List Status: <None>   Medication Order Taking? Sig Documenting Provider Last Dose Status Informant  amLODipine (NORVASC) 5 MG tablet [id number] Yes Take 1 tablet (5 mg total) by mouth daily. Kuneff, Renee A, DO Taking Active   atorvastatin (LIPITOR) 80 MG tablet [id number] Yes Take 1 tablet (80 mg total) by mouth daily. Kuneff, Renee A, DO Taking Active   blood glucose meter kit and supplies [id number] Yes Dispense based on patient and insurance preference. Use up to three times daily as directed. (FOR ICD-10 E10.9, E11.9). Kuneff, Renee A, DO Taking Active   buPROPion (WELLBUTRIN XL) 300 MG 24 hr tablet [id number] Yes Take 1 tablet (300 mg total) by mouth daily. Kuneff, Renee A, DO Taking Active   clopidogrel (PLAVIX) 75 MG tablet [id number] Yes Take 1 tablet (75 mg total) by mouth daily. Charlton Haws  C, MD Taking Active   Cyanocobalamin (VITAMIN B 12 PO) [id number] Yes Take by mouth. [provider] Taking Active   dapagliflozin propanediol (FARXIGA) 10 MG TABS tablet [id number] Yes Take 1 tablet (10 mg total) by mouth daily before breakfast. Kuneff, Renee A, DO Taking Active   famotidine (PEPCID) 20 MG tablet [id number] Yes Take 1 tablet (20 mg total) by mouth 2 (two) times daily. Kuneff, Renee A, DO Taking Active   finasteride (PROSCAR) 5 MG tablet [id number] Yes Take 5 mg by mouth  daily. [provider] Taking Active   glipiZIDE (GLUCOTROL) 10 MG tablet [id number] Yes Take 1 tablet (10 mg total) by mouth 2 (two) times daily before a meal. Kuneff, Renee A, DO Taking Active   glucose blood (ACCU-CHEK GUIDE) test strip [id number] Yes 1 each by Other route daily. Kuneff, Renee A, DO Taking Active   MAG64 64 MG TBEC [id number] Yes TAKE 1 TABLET (64 MG TOTAL) BY MOUTH 2 (TWO) TIMES DAILY. Kuneff, Renee A, DO Taking Active   magnesium chloride (SLOW-MAG) 64 MG TBEC SR tablet [id number] Yes Take 1 tablet (64 mg total) by mouth 2 (two) times daily. Kuneff, Renee A, DO Taking Active   metoprolol tartrate (LOPRESSOR) 50 MG tablet [id number] Yes Take 1 tablet (50 mg total) by mouth daily. Kuneff, Renee A, DO Taking Active   mirabegron ER (MYRBETRIQ) 50 MG TB24 tablet [id number] Yes Take 1 tablet (50 mg total) by mouth daily. Kuneff, Renee A, DO Taking Active   nitroGLYCERIN (NITROSTAT) 0.4 MG SL tablet [id number] Yes Place 1 tablet (0.4 mg total) under the tongue every 5 (five) minutes as needed for chest pain (3 doses max). Wendall Stade, MD Taking Active   pantoprazole (PROTONIX) 40 MG tablet [id number] Yes Take 1 tablet (40 mg total) by mouth 2 (two) times daily. Kuneff, Renee A, DO Taking Active   PARoxetine (PAXIL) 40 MG tablet [id number] Yes Take 1 tablet (40 mg total) by mouth daily. Felix Pacini A, DO Taking Active   SYMBICORT 160-4.5 MCG/ACT inhaler [id number] Yes Inhale 2 puffs into the lungs 2 (two) times daily. Kuneff, Renee A, DO Taking Active   tamsulosin (FLOMAX) 0.4 MG CAPS capsule [id number] Yes  [provider] Taking Active   tirzepatide [facility]) 2.5 MG/0.5ML Pen [id number] Yes Inject 2.5 mg into the skin once a week. Kuneff, Renee A, DO Taking Active   trazodone (DESYREL) 300 MG tablet [id number] Yes Take 1 tablet (300 mg total) by mouth at bedtime. Kuneff, Renee A, DO Taking Active               Assessment/Plan:   Diabetes: A1c not at goal - A1c has  improved but not at goal - initial goal is to get A1c < 8.0%;  - Continue checking blood glucose 1 to 2 times daily at varying times of day and record.  - Recommend to continue current medications for diabetes - Mounjaro 2.5mg  weekly, glipizide 10mg  twice a day and Farxiga 10mg  daily  - Recommend after 4 weeks of Mounjaro 2.5mg  if he is tolerating to increase to 5mg  weekly. - [place]xiga form [place] 11[place]25/2023 - called [place] for 2025 - patient has been re-enrolled thru 11/24/2024. His next delivery is set for 10/2023. In 2025 will need updated Rx sent to [facility].     Medication Management: Adherence has improved for 2024 per refill history - reviewed medication list and updated - Reviewed refill history and discussed  adherence and addressed adherence barriers - continue to use medication boxes to improve adherence.   Follow Up Plan: 3 to 4 weeks to consider dose increase in Baker Pierini, PharmD Clinical Pharmacist [facility]  Population Health  [phone]

## 2023-10-06 DIAGNOSIS — R3912 Poor urinary stream: Secondary | ICD-10-CM | POA: Diagnosis not present

## 2023-10-06 DIAGNOSIS — R3914 Feeling of incomplete bladder emptying: Secondary | ICD-10-CM | POA: Diagnosis not present

## 2023-10-06 DIAGNOSIS — N401 Enlarged prostate with lower urinary tract symptoms: Secondary | ICD-10-CM | POA: Diagnosis not present

## 2023-10-06 DIAGNOSIS — R350 Frequency of micturition: Secondary | ICD-10-CM | POA: Diagnosis not present

## 2023-10-06 DIAGNOSIS — R3915 Urgency of urination: Secondary | ICD-10-CM | POA: Diagnosis not present

## 2023-10-13 IMAGING — CR DG KNEE COMPLETE 4+V*R*
4 series · 4 of 4 positions shown · non-contrast
Comparison: None.

CLINICAL DATA: Acute right knee pain and tingling from knee to foot
x 2 weeks.

EXAM:
RIGHT KNEE - COMPLETE 4+ VIEW

[w knee ap right]
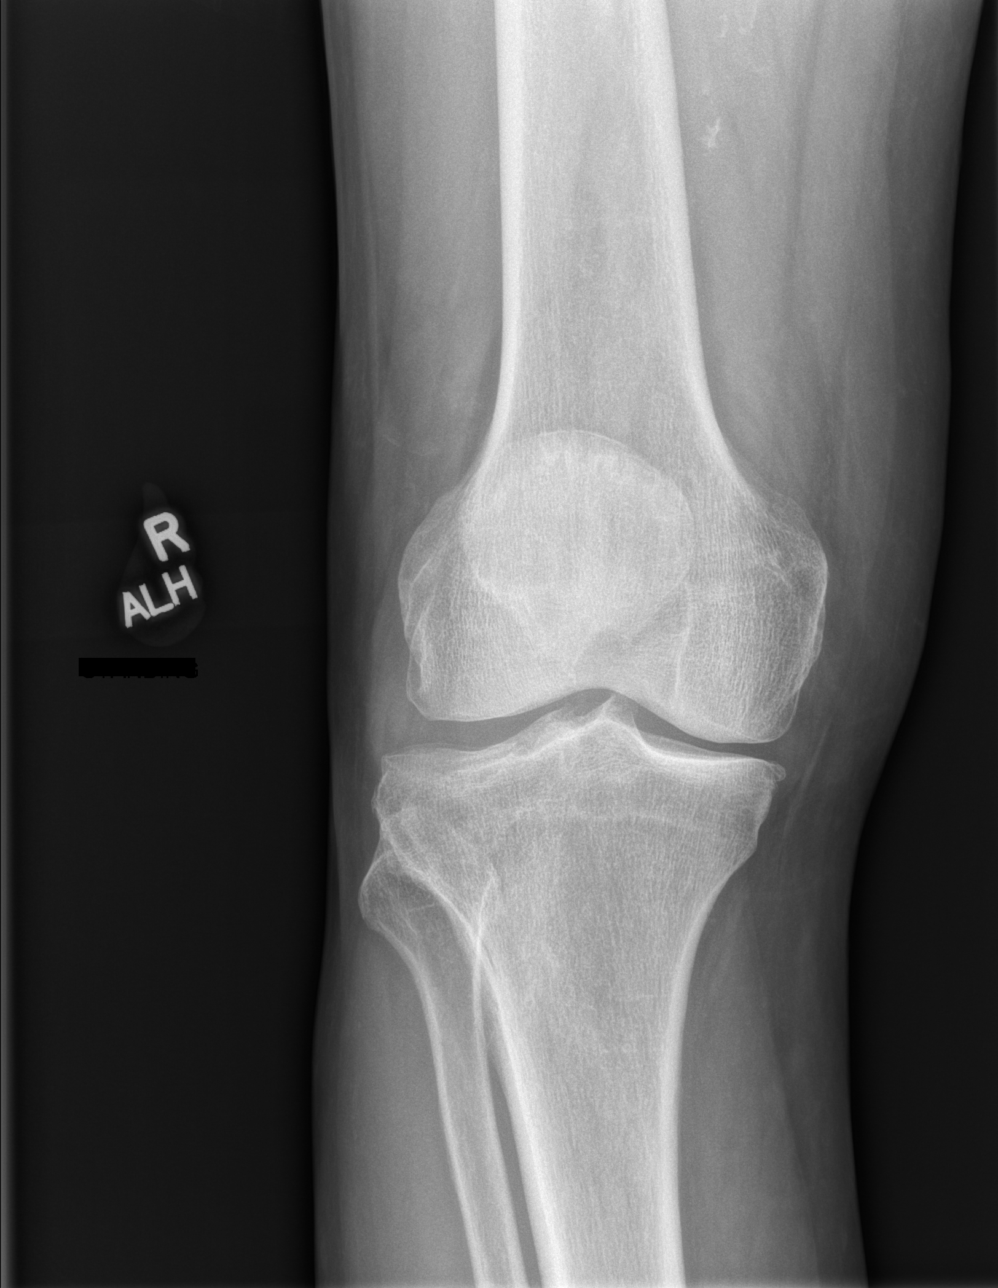

[w knee lat right]
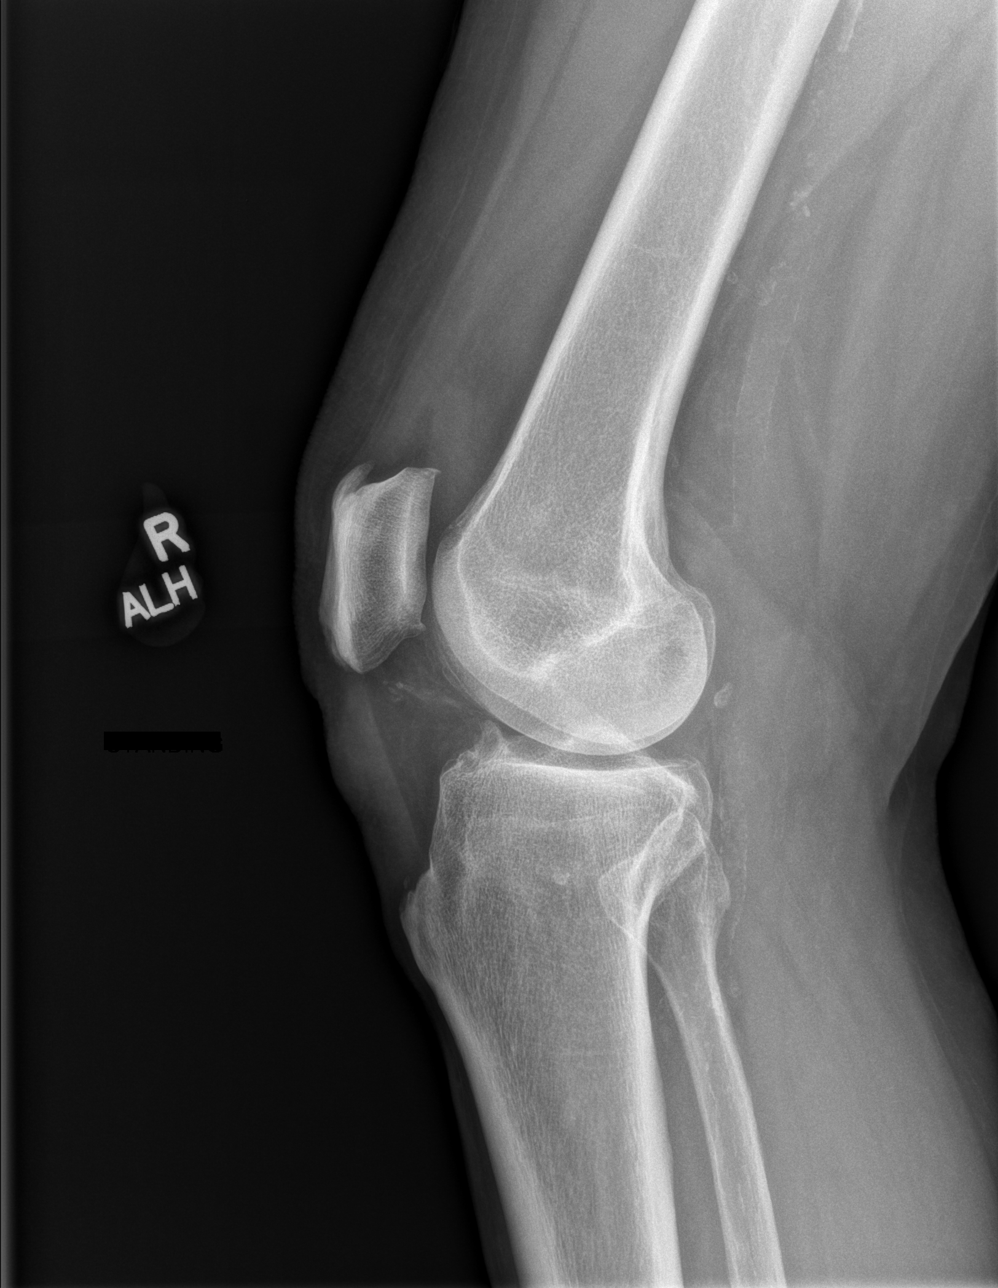

[w knee tunnel pa right]
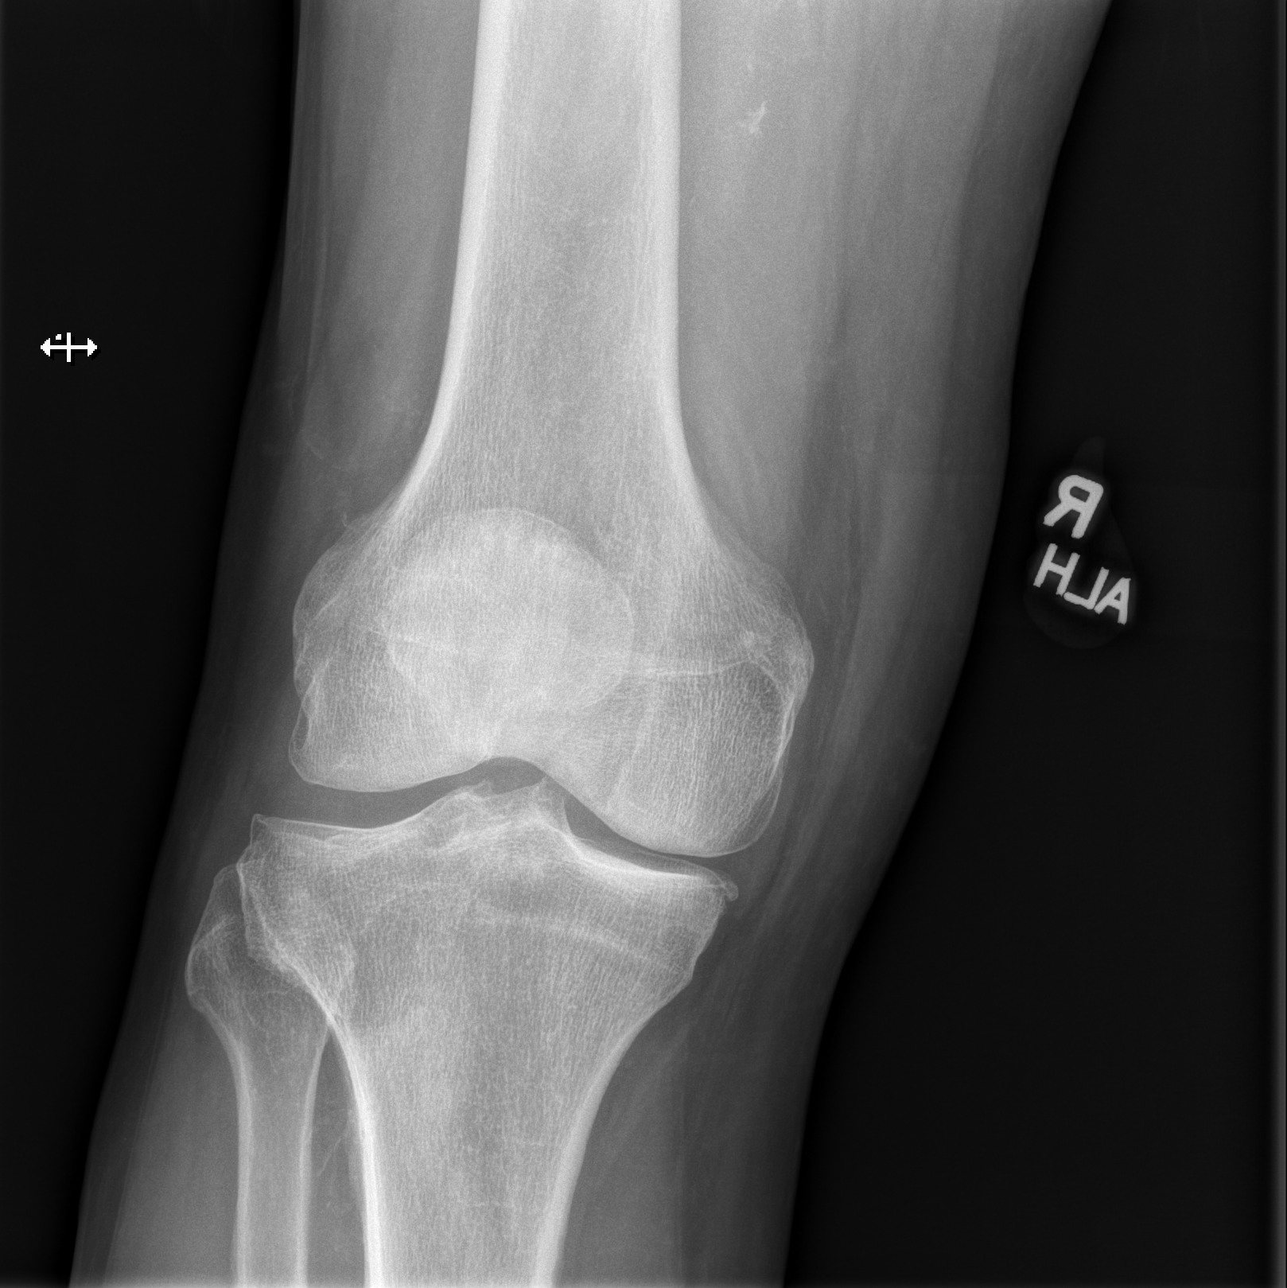

[x knee sunrise right]
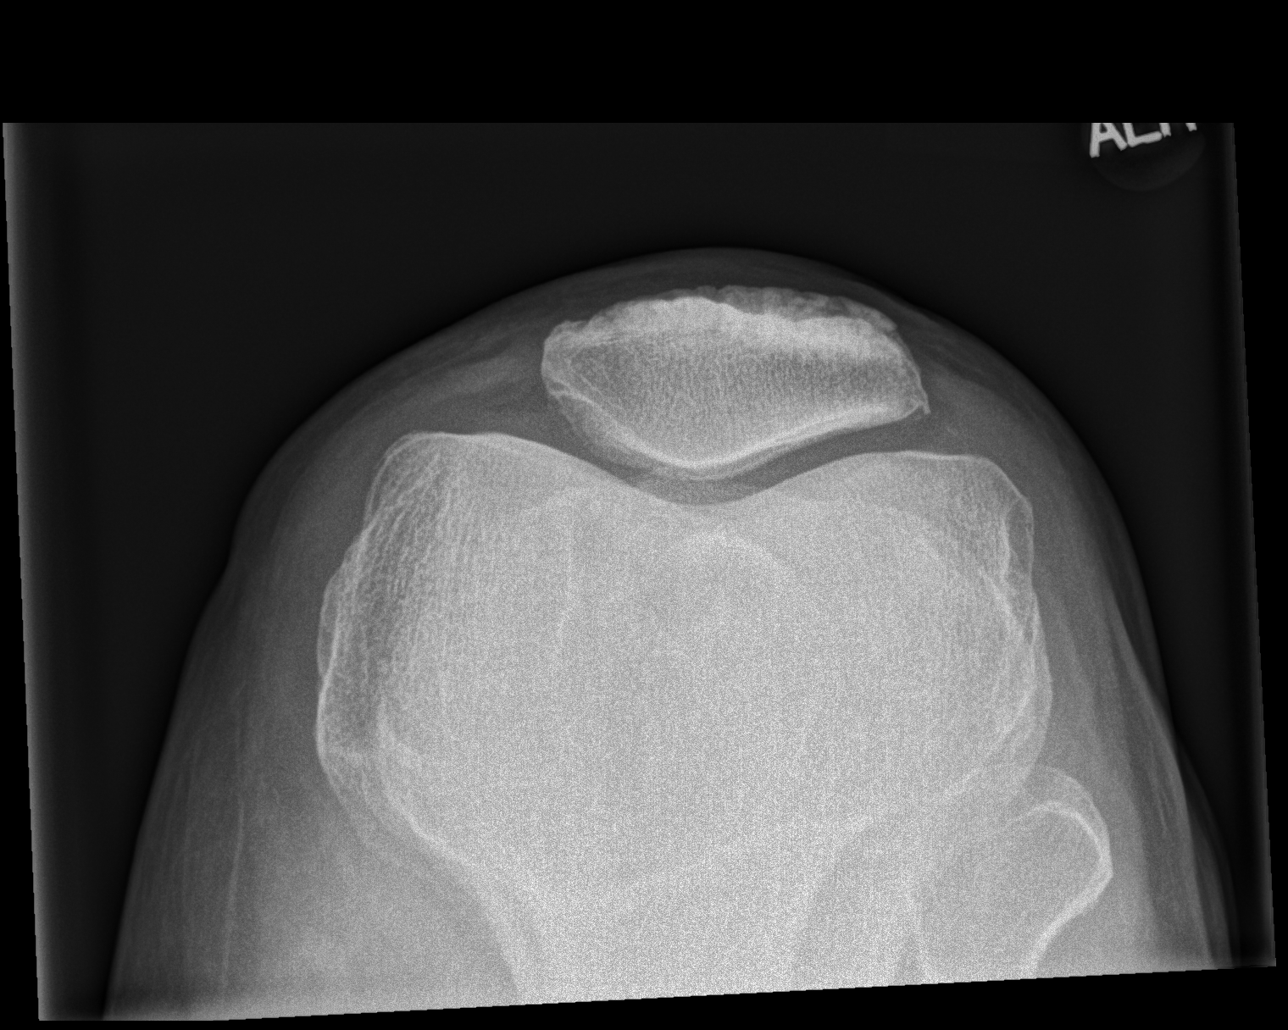

[4 of 4 positions shown; findings below may reference images not displayed]

FINDINGS: There is no acute fracture or dislocation. There is arthritic
changes of the right knee with moderate narrowing of the medial
compartment and spurring. Small suprapatellar effusion. Vascular
calcification noted. The soft tissues are unremarkable.
IMPRESSION: 1. No acute fracture or dislocation.
2. Moderate arthritic changes and small suprapatellar effusion.

## 2023-10-17 ENCOUNTER — Telehealth: Payer: Self-pay | Admitting: Pharmacist

## 2023-10-17 ENCOUNTER — Other Ambulatory Visit: Payer: Self-pay | Admitting: Pharmacist

## 2023-10-17 NOTE — Telephone Encounter (Signed)
Patient started Mounjaro 2.5mg  weeekly on 09/29/2023- Called to see if he was tolerated 2.5mg  dose and discuss moving up to 5mg  dose.  He should have had 4 doses of 2.5mg  - 11/4, 11/11, 11/18 and last one 11/25.   Unable to reach patient - LM on VM.

## 2023-10-17 NOTE — Progress Notes (Signed)
10/17/2023 Name: [name] MRN: [id number] DOB: November 13, 1946  Chief Complaint  Patient presents with   Diabetes    [name] is a 77 y.o. year old male. He was outreached by the clinical pharmacist today. Spoke with patient and his wife.     They were referred to the pharmacist by a quality report due to low adherence - failed 2023 MAD (medication adherence to diabetes med). Also noted to have uncontrolled type 2 DM. Last A1c was 8.6% - slightly improved compare to March 2024 but not at goal yet. Patient has started [facility] 2.5mg  weekly - first dose was 09/29/2023  Subjective:  Care Team: Primary Care Provider: Natalia Leatherwood, DO - next appt: 12/22/2023 Cardiologist: Dr Eden Emms, MD - next app: 12/10/2023  Medication Access/Adherence  Patient reports affordability concerns with their medications: Yes  - cost of Mounjaro was > $200 but wife reports they plan to pay for this for the next 2 months until Medicare benefits restart in 2025.  [place]. His last shipment was 08/2023.  Patient reports access/transportation concerns to their pharmacy: No  Patient reports adherence concerns with their medications:  No      Diabetes:  Current medications: Farxiga 10mg  daily, Mounjaro 2.5mg  weekly and glipizide 10 mg - twice a day.   Medications tried in the past: metformin - GI side effects  Current glucose readings: 157 was last blood glucose reading  Denies hypoglycemic s/sx including No dizziness, shakiness, sweating.  Denies hyperglycemic symptoms including polyuria,   Patient has noticed that his appetite has decreased. He has also has notice a little more constipation since starting Mounjaro.    Medication Adherence:  Atorvastatin 80mg  refilled 90 ds 11/29/2022; 02/27/2023; 05/29/2023 and 08/24/2023  Farxiga 10mg  refilled 30 ds 01/24/2023, 02/20/2023 and 03/23/2023 - [place].  Glipizide  5mg  refilled 90 day supply 02/14/2023, 05/19/2023 and 08/04/2023   Objective:  Lab Results  Component Value Date   HGBA1C 8.6 (A) 09/22/2023   HGBA1C 8.6 09/22/2023   HGBA1C 8.6 (A) 09/22/2023   HGBA1C 8.6 (A) 09/22/2023    Lab Results  Component Value Date   CREATININE 1.49 09/22/2023   BUN 24 (H) 09/22/2023   NA 136 09/22/2023   K 4.5 09/22/2023   CL 100 09/22/2023   CO2 26 09/22/2023    Lab Results  Component Value Date   CHOL 120 01/09/2022   HDL 44 01/09/2022   LDLCALC 45 01/09/2022   LDLDIRECT 57.0 09/22/2023   TRIG 262 (H) 01/09/2022   CHOLHDL 2.7 01/09/2022    Medications Reviewed Today     Reviewed by Henrene Pastor, RPH-CPP (Pharmacist) on 10/17/23 at 1604  Med List Status: <None>   Medication Order Taking? Sig Documenting Provider Last Dose Status Informant  amLODipine (NORVASC) 5 MG tablet [id number] No Take 1 tablet (5 mg total) by mouth daily. Kuneff, Renee A, DO Taking Active   atorvastatin (LIPITOR) 80 MG tablet [id number] No Take 1 tablet (80 mg total) by mouth daily. Kuneff, Renee A, DO Taking Active   blood glucose meter kit and supplies [id number] No Dispense based on patient and insurance preference. Use up to three times daily as directed. (FOR ICD-10 E10.9, E11.9). Kuneff, Renee A, DO Taking Active   buPROPion (WELLBUTRIN XL) 300 MG 24 hr tablet [id number] No Take 1 tablet (300 mg total) by mouth daily. Kuneff, Renee A, DO Taking Active  clopidogrel (PLAVIX) 75 MG tablet [id number] No Take 1 tablet (75 mg total) by mouth daily. Wendall Stade, MD Taking Active   Cyanocobalamin (VITAMIN B 12 PO) [id number] No Take by mouth. [provider] Taking Active   dapagliflozin propanediol (FARXIGA) 10 MG TABS tablet [id number] No Take 1 tablet (10 mg total) by mouth daily before breakfast. Kuneff, Renee A, DO Taking Active   famotidine (PEPCID) 20 MG tablet [id number] No Take 1 tablet (20 mg total) by mouth 2 (two) times daily. Kuneff, Renee A, DO Taking  Active   finasteride (PROSCAR) 5 MG tablet [id number] No Take 5 mg by mouth daily. [provider] Taking Active   glipiZIDE (GLUCOTROL) 10 MG tablet [id number] No Take 1 tablet (10 mg total) by mouth 2 (two) times daily before a meal. Kuneff, Renee A, DO Taking Active   glucose blood (ACCU-CHEK GUIDE) test strip [id number] No 1 each by Other route daily. Kuneff, Renee A, DO Taking Active   MAG64 64 MG TBEC [id number] No TAKE 1 TABLET (64 MG TOTAL) BY MOUTH 2 (TWO) TIMES DAILY. Kuneff, Renee A, DO Taking Active   magnesium chloride (SLOW-MAG) 64 MG TBEC SR tablet [id number] No Take 1 tablet (64 mg total) by mouth 2 (two) times daily. Kuneff, Renee A, DO Taking Active   metoprolol tartrate (LOPRESSOR) 50 MG tablet [id number] No Take 1 tablet (50 mg total) by mouth daily. Kuneff, Renee A, DO Taking Active   mirabegron ER (MYRBETRIQ) 50 MG TB24 tablet [id number] No Take 1 tablet (50 mg total) by mouth daily. Kuneff, Renee A, DO Taking Active   nitroGLYCERIN (NITROSTAT) 0.4 MG SL tablet [id number] No Place 1 tablet (0.4 mg total) under the tongue every 5 (five) minutes as needed for chest pain (3 doses max). Wendall Stade, MD Taking Active   pantoprazole (PROTONIX) 40 MG tablet [id number] No Take 1 tablet (40 mg total) by mouth 2 (two) times daily. Kuneff, Renee A, DO Taking Active   PARoxetine (PAXIL) 40 MG tablet [id number] No Take 1 tablet (40 mg total) by mouth daily. Felix Pacini A, DO Taking Active   SYMBICORT 160-4.5 MCG/ACT inhaler [id number] No Inhale 2 puffs into the lungs 2 (two) times daily. Kuneff, Renee A, DO Taking Active   tamsulosin (FLOMAX) 0.4 MG CAPS capsule [id number] No  [provider] Taking Active   tirzepatide [facility]) 2.5 MG/0.5ML Pen [id number] No Inject 2.5 mg into the skin once a week. Kuneff, Renee A, DO Taking Active   trazodone (DESYREL) 300 MG tablet [id number] No Take 1 tablet (300 mg total) by mouth at bedtime. Kuneff, Renee A, DO Taking Active                Assessment/Plan:   Diabetes: A1c not at goal - A1c has improved but not at goal - initial goal is to get A1c < 8.0%;  - Continue checking blood glucose 1 to 2 times daily at varying times of day and record.  - Continue Mounjaro 2.5mg  weekly, glipizide 10mg  twice a day and Farxiga 10mg  daily.  - Recommended using Miralax daily as needed for constipation - [place]xiga [place]2023 - called [place] [employer] [place] for 2025 - patient has been re-enrolled thru 11/24/2024. His next delivery is set for 10/2023. In 2025 will need updated Rx sent to [facility].    Follow Up Plan: January 2025 to check med cost and [place]   Henrene Pastor, PharmD Clinical  Pharmacist [facility]  Population Health  [phone]

## 2023-10-21 ENCOUNTER — Other Ambulatory Visit: Payer: Self-pay | Admitting: Pharmacist

## 2023-10-24 ENCOUNTER — Telehealth: Payer: Self-pay

## 2023-10-24 NOTE — Progress Notes (Unsigned)
Pharmacy Medication Assistance Program Note    10/24/2023  Patient ID: EDEL KOVEN, male   DOB: 09/27/1946, 77 y.o.   MRN: [id number]     10/24/2023  Outreach [employer]  Initial Outreach Date ([employer]ication [employer]) 10/24/2023  [employer] [facility] Farxiga  Dose of Farxiga 10MG   Type of Assistance Manufacturer Assistance  Date Application Sent to Patient 10/13/2023  Application Items Requested Application  Date Application Sent to Prescriber 10/24/2023  Name of Prescriber KUNEFF, RENEE DO  Date Application Received From Patient 10/24/2023       Signature

## 2023-10-24 NOTE — Telephone Encounter (Signed)
RECEIVED PT PAGES AND FAXED PROVIDER PAGES TO RENEE KUNEFF OFFICE for FARXIGA([place])   PLEASE BE ADVISED

## 2023-10-27 ENCOUNTER — Telehealth: Payer: Self-pay | Admitting: Family Medicine

## 2023-10-27 NOTE — Telephone Encounter (Signed)
PAP: Patient assistance application for Marcelline Deist has been approved by PAP Companies: [place] from 10/02/2023 to 11/24/2024. Medication should be delivered to PAP Delivery: Home For further shipping updates, please contact [employer] ([place]) at [phone] Pt ID is: no id   Please be advised

## 2023-10-27 NOTE — Telephone Encounter (Signed)
[place] provider form  faxed , to be filled out by provider. Patient requested to send it back via Fax within ASAP. Document is located in providers tray at front office.Please advise at Mobile [phone] (mobile)

## 2023-10-28 NOTE — Telephone Encounter (Signed)
Completed and returned to CMA to fax back to pharmacist

## 2023-10-28 NOTE — Telephone Encounter (Signed)
Faxed

## 2023-11-06 DIAGNOSIS — R3915 Urgency of urination: Secondary | ICD-10-CM | POA: Diagnosis not present

## 2023-11-06 DIAGNOSIS — R350 Frequency of micturition: Secondary | ICD-10-CM | POA: Diagnosis not present

## 2023-11-06 DIAGNOSIS — N401 Enlarged prostate with lower urinary tract symptoms: Secondary | ICD-10-CM | POA: Diagnosis not present

## 2023-11-06 DIAGNOSIS — R351 Nocturia: Secondary | ICD-10-CM | POA: Diagnosis not present

## 2023-11-06 DIAGNOSIS — R3912 Poor urinary stream: Secondary | ICD-10-CM | POA: Diagnosis not present

## 2023-11-10 ENCOUNTER — Other Ambulatory Visit: Payer: Self-pay | Admitting: Urology

## 2023-11-10 ENCOUNTER — Encounter (HOSPITAL_COMMUNITY): Payer: Self-pay | Admitting: Urology

## 2023-11-10 DIAGNOSIS — N401 Enlarged prostate with lower urinary tract symptoms: Secondary | ICD-10-CM

## 2023-11-23 NOTE — Progress Notes (Signed)
Chief Complaint: Patient was seen in consultation today for benign prostatic hyperplasia with lower urinary tract symptoms.   Referring Physician(s): Bell,Eugene D III  History of Present Illness: [name] is a 77 y.o. male with a medical history significant for DM2, CAD with prior MI (on Plavix), HTN, chronic kidney disease stage III, spontaneous pneumothorax, recurrent major depressive disorder, chronic bronchitis, microvascular ischemic occlusive disease with mild cognitive changes, chronic prostatitis and benign prostatic hyperplasia with lower urinary tract symptoms. He has been followed by Urology locally since at least 2021 when he developed a perineal abscess requiring I&D and many months of follow up for wound care. He has a history of elevated PSA s/p negative prostate biopsy.   He has been taking tamsulosin and Myrbatriq daily for his BPH with lower urinary tract symptoms. His initial complaints were severe urgency and nocturia with worsening of his symptoms over the past year. He now has the sensation of incomplete bladder emptying with a weak stream and is getting up approximately 5 time nightly to void. He followed up with his Urologist December 2024 and underwent a Urocuff study.   The patient is very unhappy with his urinary issues and his Urology team has referred him to Interventional Radiology to discuss prostate artery embolization. No prior episodes of acute urinary retention.      Past Medical History:  Diagnosis Date   Achilles tendinitis of left lower extremity 07/20/2018   Adenomatous colon polyp 2011   Barrett esophagus 12/2013   EGD   CAD (coronary artery disease)    ETT/Lexiscan-Myoview (11/14):  Normal; no ischemia, EF 67%   CKD (chronic kidney disease), stage III (HCC)    Colon polyp 09/2010   adenoma   COPD (chronic obstructive pulmonary disease) (HCC) 2009   spirometry with "moderate COPD"   Depression    Diabetes mellitus without complication  (HCC)    Dyspnea    GERD (gastroesophageal reflux disease)    GI bleed    after polypectomy/admitted   Hair loss 08/07/2018   HTN (hypertension)    Hypercholesterolemia    Hypomagnesemia 09/15/2022   Memory loss    Myocardial infarction (HCC)    Obesity    Pneumonia 09/15/2022   Sleep apnea    Spontaneous pneumothorax    Synovial cyst of right popliteal space 07/20/2018   Vertigo     Past Surgical History:  Procedure Laterality Date   APPENDECTOMY     CORONARY ANGIOPLASTY WITH STENT PLACEMENT  2007   LAD   PLEURAL SCARIFICATION  1987   TONSILLECTOMY     VARICOCELECTOMY      Allergies: Altace [ramipril], Latex, Losartan, Remeron [mirtazapine], Tape, Zantac [ranitidine hcl], and Codeine  Medications: Prior to Admission medications   Medication Sig Start Date End Date Taking? Authorizing Provider  amLODipine (NORVASC) 5 MG tablet Take 1 tablet (5 mg total) by mouth daily. 09/22/23   Kuneff, Renee A, DO  atorvastatin (LIPITOR) 80 MG tablet Take 1 tablet (80 mg total) by mouth daily. 06/09/23   Kuneff, Renee A, DO  blood glucose meter kit and supplies Dispense based on patient and insurance preference. Use up to three times daily as directed. (FOR ICD-10 E10.9, E11.9). 10/02/22   Kuneff, Renee A, DO  buPROPion (WELLBUTRIN XL) 300 MG 24 hr tablet Take 1 tablet (300 mg total) by mouth daily. 09/22/23   Kuneff, Renee A, DO  clopidogrel (PLAVIX) 75 MG tablet Take 1 tablet (75 mg total) by mouth daily. 08/01/23  Wendall Stade, MD  Cyanocobalamin (VITAMIN B 12 PO) Take by mouth.    [provider]  dapagliflozin propanediol (FARXIGA) 10 MG TABS tablet Take 1 tablet (10 mg total) by mouth daily before breakfast. 09/22/23   Kuneff, Renee A, DO  famotidine (PEPCID) 20 MG tablet Take 1 tablet (20 mg total) by mouth 2 (two) times daily. 09/22/23   Kuneff, Renee A, DO  finasteride (PROSCAR) 5 MG tablet Take 5 mg by mouth daily. 09/02/23   [provider]  glipiZIDE  (GLUCOTROL) 10 MG tablet Take 1 tablet (10 mg total) by mouth 2 (two) times daily before a meal. 09/22/23   Kuneff, Renee A, DO  glucose blood (ACCU-CHEK GUIDE) test strip 1 each by Other route daily. 10/31/22   Kuneff, Renee A, DO  MAG64 64 MG TBEC TAKE 1 TABLET (64 MG TOTAL) BY MOUTH 2 (TWO) TIMES DAILY. 10/01/23   Kuneff, Renee A, DO  magnesium chloride (SLOW-MAG) 64 MG TBEC SR tablet Take 1 tablet (64 mg total) by mouth 2 (two) times daily. 09/22/23   Kuneff, Renee A, DO  metoprolol tartrate (LOPRESSOR) 50 MG tablet Take 1 tablet (50 mg total) by mouth daily. 09/22/23   Kuneff, Renee A, DO  mirabegron ER (MYRBETRIQ) 50 MG TB24 tablet Take 1 tablet (50 mg total) by mouth daily. 06/09/23   Kuneff, Renee A, DO  nitroGLYCERIN (NITROSTAT) 0.4 MG SL tablet Place 1 tablet (0.4 mg total) under the tongue every 5 (five) minutes as needed for chest pain (3 doses max). 06/10/19   Wendall Stade, MD  pantoprazole (PROTONIX) 40 MG tablet Take 1 tablet (40 mg total) by mouth 2 (two) times daily. 09/22/23   Kuneff, Renee A, DO  PARoxetine (PAXIL) 40 MG tablet Take 1 tablet (40 mg total) by mouth daily. 09/22/23   Kuneff, Renee A, DO  SYMBICORT 160-4.5 MCG/ACT inhaler Inhale 2 puffs into the lungs 2 (two) times daily. 09/22/23   Kuneff, Renee A, DO  tamsulosin (FLOMAX) 0.4 MG CAPS capsule  09/28/20   [provider]  tirzepatide [facility]) 2.5 MG/0.5ML Pen Inject 2.5 mg into the skin once a week. 09/22/23   Kuneff, Renee A, DO  trazodone (DESYREL) 300 MG tablet Take 1 tablet (300 mg total) by mouth at bedtime. 09/22/23   Natalia Leatherwood, DO     Family History  Problem Relation Age of Onset   Heart disease Mother    CVA Mother    Dementia Mother    Coronary artery disease Father 34   Heart disease Father    Stroke Father    Dementia Sister    Cancer - Other Brother    Hypertension Other    Hyperlipidemia Other    Colon polyps Sister    Cancer Sister     Social History   Socioeconomic History    Marital status: Married    Spouse name: Not on file   Number of children: 2   Years of education: come college   Highest education level: Associate degree: occupational, Scientist, product/process development, or vocational program  Occupational History   Occupation: retired Chartered certified accountant  Tobacco Use   Smoking status: Former    Current packs/day: 0.00    Types: Cigarettes    Quit date: 11/25/1998    Years since quitting: 25.0   Smokeless tobacco: Never  Vaping Use   Vaping status: Never Used  Substance and Sexual Activity   Alcohol use: No   Drug use: No   Sexual activity: Not  Currently  Other Topics Concern   Not on file  Social History Narrative   Lives at home with wife. Married to [name].   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    Drinks caffeine (3 cups coffee, 2 sodas per day),  Takes a daily vitamin, wears a seatbelt   Wears a bicycle helmet.    Has dentures (partial on top)   Smoke detector in the home. No firearms in the home.    Feels safe in relationships.     Right-handed.   Social Drivers of Corporate investment banker Strain: Low Risk  (09/20/2023)   Overall Financial Resource Strain (CARDIA)    Difficulty of Paying Living Expenses: Not hard at all  Food Insecurity: No Food Insecurity (09/20/2023)   Hunger Vital Sign    Worried About Running Out of Food in the Last Year: Never true    Ran Out of Food in the Last Year: Never true  Transportation Needs: No Transportation Needs (09/20/2023)   PRAPARE - Administrator, Civil Service (Medical): No    Lack of Transportation (Non-Medical): No  Physical Activity: Unknown (12/12/2022)   Exercise Vital Sign    Days of Exercise per Week: Patient declined    Minutes of Exercise per Session: 0 min  Recent Concern: Physical Activity - Inactive (12/04/2022)   Exercise Vital Sign    Days of Exercise per Week: 0 days    Minutes of Exercise per Session: 0 min  Stress: Patient Declined (12/12/2022)   [employer] of Occupational Health - Occupational  Stress Questionnaire    Feeling of Stress : Patient declined  Social Connections: Unknown (09/20/2023)   Social Connection and Isolation Panel [NHANES]    Frequency of Communication with Friends and Family: Three times a week    Frequency of Social Gatherings with Friends and Family: Patient declined    Attends Religious Services: Patient declined    Database administrator or Organizations: No    Attends Engineer, structural: Never    Marital Status: Married    Review of Systems: A 12 point ROS discussed and pertinent positives are indicated in the HPI above.  All other systems are negative.   Vital Signs: There were no vitals taken for this visit.  Advance Care Plan: The advanced care plan/surrogate decision maker was discussed at the time of visit and documented in the medical record.    Physical Exam Constitutional:      General: He is not in acute distress. HENT:     Head: Normocephalic.     Mouth/Throat:     Mouth: Mucous membranes are moist.  Eyes:     General: No scleral icterus. Cardiovascular:     Rate and Rhythm: Normal rate.  Pulmonary:     Effort: Pulmonary effort is normal. No respiratory distress.  Abdominal:     General: There is no distension.  Musculoskeletal:     Right lower leg: No edema.     Left lower leg: No edema.  Skin:    General: Skin is warm and dry.  Neurological:     Mental Status: He is alert and oriented to person, place, and time.     Imaging: [place] Prostate (10/06/23)   Labs:  CBC: Recent Labs    12/13/22 1122 09/22/23 1355  WBC 8.6 4.2  HGB 12.4* 12.6*  HCT 37.6* 39.1  PLT 254.0 205.0    COAGS: No results for input(s): "INR", "APTT" in the last 8760 hours.  BMP:  Recent Labs    12/13/22 1122 01/31/23 1307 09/22/23 1355  NA 137 137 136  K 4.8 4.5 4.5  CL 98 99 100  CO2 29 25 26   GLUCOSE 317* 251* 298*  BUN 31* 19 24*  CALCIUM 9.9 9.8 9.5  CREATININE 1.44 1.52* 1.49    LIVER FUNCTION TESTS: Recent  Labs    12/13/22 1122 09/22/23 1355  BILITOT 0.4 0.6  AST 13 16  ALT 23 23  ALKPHOS 78 75  PROT 7.2 6.7  ALBUMIN 4.4 4.2    TUMOR MARKERS: No results for input(s): "AFPTM", "CEA", "CA199", "CHROMGRNA" in the last 8760 hours.  Assessment and Plan: 77 year old male with a history of benign prostatic hyperplasia 188 g with moderate lower urinary tract symptoms (IPSS-QoL 18/3).  He would be an excellent candidate for prostate artery embolization for symptom improvement.  We discussed the rationale, periprocedural expectations including risks and benefits, as well as long term outcomes after prostate artery embolization.  He would like to proceed.  Plan for prostate artery embolization with moderate sedation via left radial artery approach at [facility].  No need to hold Plavix.     Marliss Coots, MD Pager: [phone]    I spent a total of  40 Minutes   in face to face in clinical consultation, greater than 50% of which was counseling/coordinating care for benign prostatic hyperplasia.

## 2023-11-24 ENCOUNTER — Ambulatory Visit
Admission: RE | Admit: 2023-11-24 | Discharge: 2023-11-24 | Disposition: A | Discharge status: Home / Self Care | Payer: Medicare HMO | Source: Ambulatory Visit | Attending: Urology | Admitting: Urology

## 2023-11-24 DIAGNOSIS — R3915 Urgency of urination: Secondary | ICD-10-CM | POA: Diagnosis not present

## 2023-11-24 DIAGNOSIS — N401 Enlarged prostate with lower urinary tract symptoms: Secondary | ICD-10-CM | POA: Diagnosis not present

## 2023-11-24 DIAGNOSIS — R351 Nocturia: Secondary | ICD-10-CM | POA: Diagnosis not present

## 2023-11-24 HISTORY — PX: IR RADIOLOGIST EVAL & MGMT: IMG5224

## 2023-11-29 ENCOUNTER — Other Ambulatory Visit: Payer: Self-pay | Admitting: Family Medicine

## 2023-12-02 ENCOUNTER — Other Ambulatory Visit: Payer: Self-pay

## 2023-12-02 MED ORDER — MIRABEGRON ER 50 MG PO TB24: 50.0000 mg | ORAL_TABLET | Freq: Every day | ORAL | 0 refills | Status: DC

## 2023-12-02 NOTE — Telephone Encounter (Signed)
 Med sent.

## 2023-12-03 NOTE — Progress Notes (Signed)
 Cardiology Office Note:    Date:  12/10/2023   ID:  [name], DOB 04-03-1946, MRN [id number]  PCP:  Todd Shope, DO  [facility] Cardiologist:  Todd Mediate, MD  [facility] Electrophysiologist:  None   Chief Complaint: Yearly follow-up  History of Present Illness:    [name] is a 78 y.o. male with a hx of CAD s/p LAD stent in 2007, COPD, depression, hypertension, hyperlipidemia, diabetes mellitus, Barrett's esophagus with prior GI bleed and CKD presents for follow-up.  Last Myoview  in 2014 was without ischemia. Last seen by me February 2022.  Patient is here for follow-up.  No regular exercise but does yard work without chest pain or shortness of breath.  Denies palpitation, orthopnea, PND, syncope, lower extremity edema or melena.  Compliant with medications.  Lots of GU issues with perineal abscess, BPH and elevated PSA seen by IR for prostate artery embolization   Discussed having lexiscan  myovue to make sure no progression of CAD given his poorly controlled DM He was started on Mounjaro a month ago to get better control   Past Medical History:  Diagnosis Date   Achilles tendinitis of left lower extremity 07/20/2018   Adenomatous colon polyp 2011   Barrett esophagus 12/2013   EGD   CAD (coronary artery disease)    ETT/Lexiscan -Myoview  (11/14):  Normal; no ischemia, EF 67%   CKD (chronic kidney disease), stage III (HCC)    Colon polyp 09/2010   adenoma   COPD (chronic obstructive pulmonary disease) (HCC) 2009   spirometry with "moderate COPD"   Depression    Diabetes mellitus without complication (HCC)    Dyspnea    GERD (gastroesophageal reflux disease)    GI bleed    after polypectomy/admitted   Hair loss 08/07/2018   HTN (hypertension)    Hypercholesterolemia    Hypomagnesemia 09/15/2022   Memory loss    Myocardial infarction (HCC)    Obesity    Pneumonia 09/15/2022   Sleep apnea    Spontaneous pneumothorax    Synovial cyst of right  popliteal space 07/20/2018   Vertigo     Past Surgical History:  Procedure Laterality Date   APPENDECTOMY     CORONARY ANGIOPLASTY WITH STENT PLACEMENT  2007   LAD   IR RADIOLOGIST EVAL & MGMT  11/24/2023   PLEURAL SCARIFICATION  1987   TONSILLECTOMY     VARICOCELECTOMY      Current Medications: Current Meds  Medication Sig   amLODipine  (NORVASC ) 5 MG tablet Take 1 tablet (5 mg total) by mouth daily.   atorvastatin  (LIPITOR) 80 MG tablet Take 1 tablet (80 mg total) by mouth daily.   blood glucose meter kit and supplies Dispense based on patient and insurance preference. Use up to three times daily as directed. (FOR ICD-10 E10.9, E11.9).   buPROPion  (WELLBUTRIN  XL) 300 MG 24 hr tablet Take 1 tablet (300 mg total) by mouth daily.   clopidogrel  (PLAVIX ) 75 MG tablet Take 1 tablet (75 mg total) by mouth daily.   Cyanocobalamin  (VITAMIN B 12 PO) Take by mouth.   dapagliflozin  propanediol (FARXIGA ) 10 MG TABS tablet Take 1 tablet (10 mg total) by mouth daily before breakfast.   famotidine  (PEPCID ) 20 MG tablet Take 1 tablet (20 mg total) by mouth 2 (two) times daily.   finasteride (PROSCAR) 5 MG tablet Take 5 mg by mouth daily.   glipiZIDE  (GLUCOTROL ) 10 MG tablet Take 1 tablet (10 mg total) by mouth 2 (two) times daily  before a meal.   glucose blood (ACCU-CHEK GUIDE) test strip 1 each by Other route daily.   MAG64 64 MG TBEC TAKE 1 TABLET (64 MG TOTAL) BY MOUTH 2 (TWO) TIMES DAILY.   magnesium  chloride (SLOW-MAG) 64 MG TBEC SR tablet Take 1 tablet (64 mg total) by mouth 2 (two) times daily.   metoprolol  tartrate (LOPRESSOR ) 50 MG tablet Take 1 tablet (50 mg total) by mouth daily.   mirabegron  ER (MYRBETRIQ ) 50 MG TB24 tablet Take 1 tablet (50 mg total) by mouth daily.   nitroGLYCERIN  (NITROSTAT ) 0.4 MG SL tablet Place 1 tablet (0.4 mg total) under the tongue every 5 (five) minutes as needed for chest pain (3 doses max).   pantoprazole  (PROTONIX ) 40 MG tablet Take 1 tablet (40 mg total)  by mouth 2 (two) times daily.   PARoxetine  (PAXIL ) 40 MG tablet Take 1 tablet (40 mg total) by mouth daily.   SYMBICORT  160-4.5 MCG/ACT inhaler Inhale 2 puffs into the lungs 2 (two) times daily.   tamsulosin (FLOMAX) 0.4 MG CAPS capsule    tirzepatide (MOUNJARO) 2.5 MG/0.5ML Pen Inject 2.5 mg into the skin once a week.   trazodone  (DESYREL ) 300 MG tablet Take 1 tablet (300 mg total) by mouth at bedtime.     Allergies:   Altace [ramipril], Latex, Losartan , Remeron  [mirtazapine ], Tape, Zantac [ranitidine hcl], and Codeine   Social History   Socioeconomic History   Marital status: Married    Spouse name: Not on file   Number of children: 2   Years of education: come college   Highest education level: Associate degree: occupational, Scientist, product/process development, or vocational program  Occupational History   Occupation: retired Chartered certified accountant  Tobacco Use   Smoking status: Former    Current packs/day: 0.00    Types: Cigarettes    Quit date: 11/25/1998    Years since quitting: 25.0   Smokeless tobacco: Never  Vaping Use   Vaping status: Never Used  Substance and Sexual Activity   Alcohol use: No   Drug use: No   Sexual activity: Not Currently  Other Topics Concern   Not on file  Social History Narrative   Lives at home with wife. Married to [name].   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    Drinks caffeine (3 cups coffee, 2 sodas per day),  Takes a daily vitamin, wears a seatbelt   Wears a bicycle helmet.    Has dentures (partial on top)   Smoke detector in the home. No firearms in the home.    Feels safe in relationships.     Right-handed.   Social Drivers of Corporate investment banker Strain: Low Risk  (09/20/2023)   Overall Financial Resource Strain (CARDIA)    Difficulty of Paying Living Expenses: Not hard at all  Food Insecurity: No Food Insecurity (09/20/2023)   Hunger Vital Sign    Worried About Running Out of Food in the Last Year: Never true    Ran Out of Food in the Last Year: Never true   Transportation Needs: No Transportation Needs (09/20/2023)   PRAPARE - Administrator, Civil Service (Medical): No    Lack of Transportation (Non-Medical): No  Physical Activity: Unknown (12/12/2022)   Exercise Vital Sign    Days of Exercise per Week: Patient declined    Minutes of Exercise per Session: Not on file  Recent Concern: Physical Activity - Inactive (12/04/2022)   Exercise Vital Sign    Days of Exercise per Week: 0 days  Minutes of Exercise per Session: 0 min  Stress: Patient Declined (12/12/2022)   [employer] of Occupational Health - Occupational Stress Questionnaire    Feeling of Stress : Patient declined  Social Connections: Unknown (09/20/2023)   Social Connection and Isolation Panel [NHANES]    Frequency of Communication with Friends and Family: Three times a week    Frequency of Social Gatherings with Friends and Family: Patient declined    Attends Religious Services: Patient declined    Database administrator or Organizations: No    Attends Engineer, structural: Not on file    Marital Status: Married     Family History: The patient's family history includes CVA in his mother; Cancer in his sister; Cancer - Other in his brother; Colon polyps in his sister; Coronary artery disease (age of onset: 57) in his father; Dementia in his mother and sister; Heart disease in his father and mother; Hyperlipidemia in an other family member; Hypertension in an other family member; Stroke in his father.    ROS:   Please see the history of present illness.    All other systems reviewed and are negative.   EKGs/Labs/Other Studies Reviewed:    The following studies were reviewed today:  Carotid doppler 08/2020 Summary:  Right Carotid: Velocities in the right ICA are consistent with a 1-39%  stenosis.   Left Carotid: Velocities in the left ICA are consistent with a 1-39%  stenosis.   EKG:  SR rate 22 old IMI no acute ST changes 12/10/2023    Recent Labs: 01/31/2023: Magnesium  2.0 09/22/2023: ALT 23; BUN 24; Creatinine, Ser 1.49; Hemoglobin 12.6; Platelets 205.0; Potassium 4.5; Sodium 136; TSH 1.62  Recent Lipid Panel    Component Value Date/Time   CHOL 120 01/09/2022 1512   TRIG 262 (H) 01/09/2022 1512   HDL 44 01/09/2022 1512   CHOLHDL 2.7 01/09/2022 1512   VLDL 45.6 (H) 11/09/2020 1150   LDLCALC 45 01/09/2022 1512   LDLDIRECT 57.0 09/22/2023 1355     Physical Exam:    VS:  BP 122/80 (BP Location: Left Arm, Patient Position: Sitting)   Pulse 78   Ht 5\' 11"  (1.803 m)   Wt 210 lb (95.3 kg)   SpO2 97%   BMI 29.29 kg/m     Wt Readings from Last 3 Encounters:  12/10/23 210 lb (95.3 kg)  09/22/23 212 lb 6.4 oz (96.3 kg)  06/09/23 211 lb 9.6 oz (96 kg)     GEN:  Well nourished, well developed in no acute distress HEENT: Normal NECK: No JVD; No carotid bruits LYMPHATICS: No lymphadenopathy CARDIAC: RRR, no murmurs, rubs, gallops RESPIRATORY:  Clear to auscultation without rales, wheezing or rhonchi  ABDOMEN: Soft, non-tender, non-distended MUSCULOSKELETAL:  No edema; No deformity  SKIN: Warm and dry NEUROLOGIC:  Alert and oriented x 3 PSYCHIATRIC:  Normal affect   ASSESSMENT AND PLAN:    CAD s/p LAD stent in 2007 -No angina.  On Plavix  as monotherapy.  Previously on aspirin which was discontinued after GI bleed.  Continue statin and beta-blocker. - Given poorly controlled DM favor lexiscan  myovue to risk stratify for progressive dx and stent restenosis   2.  Hypertension Blood pressure stable and controlled on current medications  3.  Hyperlipidemia - LDL 57 09/22/23   -Followed by PCP -Continue statin  4.  Diabetes mellitus - A1c 8.6 09/22/23 poor control - Monjaro started 09/22/23 - on glucotrol  and Farxiga   - f/u primary   Lexiscan  Myovue  F/U in a year   Signed, Todd Mediate, MD  12/10/2023 2:18 PM     [facility]

## 2023-12-04 ENCOUNTER — Other Ambulatory Visit (HOSPITAL_COMMUNITY): Payer: Self-pay | Admitting: Interventional Radiology

## 2023-12-04 DIAGNOSIS — N138 Other obstructive and reflux uropathy: Secondary | ICD-10-CM

## 2023-12-10 ENCOUNTER — Ambulatory Visit: Payer: Medicare HMO

## 2023-12-10 ENCOUNTER — Ambulatory Visit: Payer: Medicare HMO | Attending: Cardiovascular Disease | Admitting: Cardiovascular Disease

## 2023-12-10 VITALS — BP 122/80 | HR 78 | Ht 71.0 in | Wt 210.0 lb

## 2023-12-10 DIAGNOSIS — I2510 Atherosclerotic heart disease of native coronary artery without angina pectoris: Secondary | ICD-10-CM | POA: Diagnosis not present

## 2023-12-10 DIAGNOSIS — E782 Mixed hyperlipidemia: Secondary | ICD-10-CM

## 2023-12-10 DIAGNOSIS — I10 Essential (primary) hypertension: Secondary | ICD-10-CM | POA: Diagnosis not present

## 2023-12-10 NOTE — Patient Instructions (Signed)
 Medication Instructions:  Your physician recommends that you continue on your current medications as directed. Please refer to the Current Medication list given to you today.  *If you need a refill on your cardiac medications before your next appointment, please call your pharmacy*  Lab Work: If you have labs (blood work) drawn today and your tests are completely normal, you will receive your results only by: MyChart Message (if you have MyChart) OR A paper copy in the mail If you have any lab test that is abnormal or we need to change your treatment, we will call you to review the results.  Testing/Procedures: Your physician has requested that you have a lexiscan  myoview . For further information please visit [email or web address] Please follow instruction sheet, as given.  Follow-Up: At [facility], you and your health needs are our priority.  As part of our continuing mission to provide you with exceptional heart care, we have created designated Provider Care Teams.  These Care Teams include your primary Cardiologist (physician) and Advanced Practice Providers (APPs -  Physician Assistants and Nurse Practitioners) who all work together to provide you with the care you need, when you need it.  We recommend signing up for the patient portal called "MyChart".  Sign up information is provided on this After Visit Summary.  MyChart is used to connect with patients for Virtual Visits (Telemedicine).  Patients are able to view lab/test results, encounter notes, upcoming appointments, etc.  Non-urgent messages can be sent to your provider as well.   To learn more about what you can do with MyChart, go to [email or web address].    Your next appointment:   1 year(s)  Provider:   Janelle Mediate, MD     Other Instructions

## 2023-12-12 ENCOUNTER — Other Ambulatory Visit: Payer: Self-pay | Admitting: Pharmacist

## 2023-12-19 ENCOUNTER — Other Ambulatory Visit (HOSPITAL_COMMUNITY): Payer: Self-pay | Admitting: Student

## 2023-12-19 DIAGNOSIS — N401 Enlarged prostate with lower urinary tract symptoms: Secondary | ICD-10-CM

## 2023-12-19 MED ORDER — SOLIFENACIN SUCCINATE 5 MG PO TABS: 5.0000 mg | ORAL_TABLET | Freq: Every day | ORAL | 0 refills | Status: AC

## 2023-12-19 MED ORDER — CIPROFLOXACIN HCL 500 MG PO TABS: 500.0000 mg | ORAL_TABLET | Freq: Two times a day (BID) | ORAL | 0 refills | Status: AC

## 2023-12-19 MED ORDER — PHENAZOPYRIDINE HCL 100 MG PO TABS: 100.0000 mg | ORAL_TABLET | Freq: Three times a day (TID) | ORAL | 0 refills | Status: AC

## 2023-12-19 MED ORDER — METHYLPREDNISOLONE 4 MG PO TBPK: ORAL_TABLET | ORAL | 0 refills | Status: DC

## 2023-12-19 NOTE — Progress Notes (Signed)
Patient scheduled for prostate artery embolization 12/24/23 with Dr. Elby Showers. Post-procedure medications e-prescribed to the patient's pharmacy - [facility] in [place]. The medications were discussed with the patient and he understands these medications are for after the procedure. We also spent time discussing the procedure details (when to arrive, what the procedure entails, etc).   The patient has the number to the IR APP office at [facility] and he knows to call me if he has any questions/concerns prior to his procedure.  Alwyn Ren, AGACNP-BC 12/19/2023, 9:17 AM

## 2023-12-22 ENCOUNTER — Encounter: Payer: Self-pay | Admitting: Family Medicine

## 2023-12-22 ENCOUNTER — Encounter (HOSPITAL_COMMUNITY): Payer: Self-pay

## 2023-12-22 ENCOUNTER — Ambulatory Visit: Payer: Medicare HMO | Admitting: Family Medicine

## 2023-12-22 VITALS — BP 118/70 | HR 70 | Temp 98.0°F | Wt 211.2 lb

## 2023-12-22 DIAGNOSIS — Z79899 Other long term (current) drug therapy: Secondary | ICD-10-CM

## 2023-12-22 DIAGNOSIS — K2100 Gastro-esophageal reflux disease with esophagitis, without bleeding: Secondary | ICD-10-CM | POA: Diagnosis not present

## 2023-12-22 DIAGNOSIS — Z7984 Long term (current) use of oral hypoglycemic drugs: Secondary | ICD-10-CM | POA: Diagnosis not present

## 2023-12-22 DIAGNOSIS — D6869 Other thrombophilia: Secondary | ICD-10-CM

## 2023-12-22 DIAGNOSIS — N138 Other obstructive and reflux uropathy: Secondary | ICD-10-CM

## 2023-12-22 DIAGNOSIS — E1169 Type 2 diabetes mellitus with other specified complication: Secondary | ICD-10-CM

## 2023-12-22 DIAGNOSIS — R972 Elevated prostate specific antigen [PSA]: Secondary | ICD-10-CM

## 2023-12-22 DIAGNOSIS — E785 Hyperlipidemia, unspecified: Secondary | ICD-10-CM | POA: Diagnosis not present

## 2023-12-22 DIAGNOSIS — I252 Old myocardial infarction: Secondary | ICD-10-CM | POA: Diagnosis not present

## 2023-12-22 DIAGNOSIS — I679 Cerebrovascular disease, unspecified: Secondary | ICD-10-CM

## 2023-12-22 DIAGNOSIS — F330 Major depressive disorder, recurrent, mild: Secondary | ICD-10-CM | POA: Diagnosis not present

## 2023-12-22 DIAGNOSIS — I2510 Atherosclerotic heart disease of native coronary artery without angina pectoris: Secondary | ICD-10-CM | POA: Diagnosis not present

## 2023-12-22 DIAGNOSIS — Z5181 Encounter for therapeutic drug level monitoring: Secondary | ICD-10-CM | POA: Diagnosis not present

## 2023-12-22 DIAGNOSIS — I10 Essential (primary) hypertension: Secondary | ICD-10-CM | POA: Diagnosis not present

## 2023-12-22 DIAGNOSIS — N401 Enlarged prostate with lower urinary tract symptoms: Secondary | ICD-10-CM

## 2023-12-22 DIAGNOSIS — D500 Iron deficiency anemia secondary to blood loss (chronic): Secondary | ICD-10-CM

## 2023-12-22 DIAGNOSIS — Z7985 Long-term (current) use of injectable non-insulin antidiabetic drugs: Secondary | ICD-10-CM | POA: Diagnosis not present

## 2023-12-22 DIAGNOSIS — F5101 Primary insomnia: Secondary | ICD-10-CM

## 2023-12-22 DIAGNOSIS — G319 Degenerative disease of nervous system, unspecified: Secondary | ICD-10-CM

## 2023-12-22 DIAGNOSIS — I6782 Cerebral ischemia: Secondary | ICD-10-CM

## 2023-12-22 LAB — POCT GLYCOSYLATED HEMOGLOBIN (HGB A1C)
HbA1c POC (<> result, manual entry): 6.9 % (ref 4.0–5.6)
HbA1c, POC (controlled diabetic range): 6.9 % (ref 0.0–7.0)
HbA1c, POC (prediabetic range): 6.9 % — AB (ref 5.7–6.4)
Hemoglobin A1C: 6.9 % — AB (ref 4.0–5.6)

## 2023-12-22 MED ORDER — FAMOTIDINE 20 MG PO TABS: 20.0000 mg | ORAL_TABLET | Freq: Two times a day (BID) | ORAL | 3 refills | Status: DC

## 2023-12-22 MED ORDER — PAROXETINE HCL 40 MG PO TABS: 40.0000 mg | ORAL_TABLET | Freq: Every day | ORAL | 1 refills | Status: DC

## 2023-12-22 MED ORDER — AMLODIPINE BESYLATE 5 MG PO TABS: 5.0000 mg | ORAL_TABLET | Freq: Every day | ORAL | 1 refills | Status: DC

## 2023-12-22 MED ORDER — DAPAGLIFLOZIN PROPANEDIOL 10 MG PO TABS: 10.0000 mg | ORAL_TABLET | Freq: Every day | ORAL | 2 refills | Status: DC

## 2023-12-22 MED ORDER — BUPROPION HCL ER (XL) 300 MG PO TB24: 300.0000 mg | ORAL_TABLET | Freq: Every day | ORAL | 1 refills | Status: DC

## 2023-12-22 MED ORDER — TRAZODONE HCL 300 MG PO TABS: 300.0000 mg | ORAL_TABLET | Freq: Every day | ORAL | 1 refills | Status: DC

## 2023-12-22 MED ORDER — GLIPIZIDE 10 MG PO TABS: ORAL_TABLET | ORAL | 1 refills | Status: DC

## 2023-12-22 MED ORDER — MOUNJARO 5 MG/0.5ML ~~LOC~~ SOAJ: 5.0000 mg | SUBCUTANEOUS | 5 refills | Status: DC

## 2023-12-22 MED ORDER — METOPROLOL TARTRATE 50 MG PO TABS: 50.0000 mg | ORAL_TABLET | Freq: Every day | ORAL | 1 refills | Status: DC

## 2023-12-22 NOTE — H&P (Signed)
Chief Complaint: Patient was seen in consultation today for benign prostatic hyperplasia   Referring Physician(s): Dr. Alvester Morin   Supervising Physician: Marliss Coots  Patient Status: [facility] - Out-pt  History of Present Illness: [name] is a 78 y.o. male with a medical history significant for DM2, CAD with prior MI (on Plavix), HTN, chronic kidney disease stage III, spontaneous pneumothorax, recurrent major depressive disorder, chronic bronchitis, microvascular ischemic occlusive disease with mild cognitive changes, chronic prostatitis and benign prostatic hyperplasia with lower urinary tract symptoms. He has been followed by Urology locally since at least 2021 when he developed a perineal abscess requiring I&D and many months of follow-up for wound care. He has a history of elevated PSA s/p negative prostate biopsy.    He has been taking tamsulosin and Myrbatriq daily for his BPH with lower urinary tract symptoms. His initial complaints were severe urgency and nocturia with worsening of his symptoms over the past year. He now has the sensation of incomplete bladder emptying with a weak stream and is getting up approximately 5 times nightly to void. He followed up with his Urologist December 2024 and underwent a Urocuff study.    The patient was very unhappy with his urinary issues and his Urology team referred him to Interventional Radiology to discuss prostate artery embolization. He met with Dr. Elby Showers 11/24/23. They discussed the rationale, periprocedural expectations including risks and benefits, as well as long-term outcomes after prostate artery embolization. He was interested in proceeding.   Past Medical History:  Diagnosis Date   Achilles tendinitis of left lower extremity 07/20/2018   Adenomatous colon polyp 2011   Barrett esophagus 12/2013   EGD   CAD (coronary artery disease)    ETT/Lexiscan-Myoview (11/14):  Normal; no ischemia, EF 67%   CKD (chronic kidney disease), stage  III (HCC)    Colon polyp 09/2010   adenoma   COPD (chronic obstructive pulmonary disease) (HCC) 2009   spirometry with "moderate COPD"   Depression    Diabetes mellitus without complication (HCC)    Dyspnea    GERD (gastroesophageal reflux disease)    GI bleed    after polypectomy/admitted   Hair loss 08/07/2018   HTN (hypertension)    Hypercholesterolemia    Hypomagnesemia 09/15/2022   Memory loss    Myocardial infarction (HCC)    Obesity    Pneumonia 09/15/2022   Sleep apnea    Spontaneous pneumothorax    Synovial cyst of right popliteal space 07/20/2018   Vertigo     Past Surgical History:  Procedure Laterality Date   APPENDECTOMY     CORONARY ANGIOPLASTY WITH STENT PLACEMENT  2007   LAD   IR RADIOLOGIST EVAL & MGMT  11/24/2023   PLEURAL SCARIFICATION  1987   TONSILLECTOMY     VARICOCELECTOMY      Allergies: Altace [ramipril], Latex, Losartan, Remeron [mirtazapine], Tape, Zantac [ranitidine hcl], and Codeine  Medications: Prior to Admission medications   Medication Sig Start Date End Date Taking? Authorizing Provider  ciprofloxacin (CIPRO) 500 MG tablet Take 1 tablet (500 mg total) by mouth 2 (two) times daily for 7 days. 12/19/23 12/26/23 Yes Jaylin Roundy, Arman Filter, NP  methylPREDNISolone (MEDROL DOSEPAK) 4 MG TBPK tablet Dispense per package instructions 12/19/23  Yes Mickie Kay, NP  phenazopyridine (PYRIDIUM) 100 MG tablet Take 1 tablet (100 mg total) by mouth in the morning, at noon, and at bedtime for 7 days. 12/19/23 12/26/23 Yes Mickie Kay, NP  solifenacin (VESICARE) 5 MG tablet  Take 1 tablet (5 mg total) by mouth daily for 7 days. 12/19/23 12/26/23 Yes Aarya Quebedeaux, Arman Filter, NP  amLODipine (NORVASC) 5 MG tablet Take 1 tablet (5 mg total) by mouth daily. 12/22/23   Kuneff, Renee A, DO  atorvastatin (LIPITOR) 80 MG tablet Take 1 tablet (80 mg total) by mouth daily. 06/09/23   Kuneff, Renee A, DO  blood glucose meter kit and supplies Dispense based on patient  and insurance preference. Use up to three times daily as directed. (FOR ICD-10 E10.9, E11.9). 10/02/22   Kuneff, Renee A, DO  buPROPion (WELLBUTRIN XL) 300 MG 24 hr tablet Take 1 tablet (300 mg total) by mouth daily. 12/22/23   Kuneff, Renee A, DO  clopidogrel (PLAVIX) 75 MG tablet Take 1 tablet (75 mg total) by mouth daily. 08/01/23   Wendall Stade, MD  Cyanocobalamin (VITAMIN B 12 PO) Take by mouth.    [provider]  dapagliflozin propanediol (FARXIGA) 10 MG TABS tablet Take 1 tablet (10 mg total) by mouth daily before breakfast. 12/22/23   Kuneff, Renee A, DO  famotidine (PEPCID) 20 MG tablet Take 1 tablet (20 mg total) by mouth 2 (two) times daily. 12/22/23   Kuneff, Renee A, DO  finasteride (PROSCAR) 5 MG tablet Take 5 mg by mouth daily. 09/02/23   [provider]  glipiZIDE (GLUCOTROL) 10 MG tablet 1 tab with dinner 12/22/23   Kuneff, Renee A, DO  glucose blood (ACCU-CHEK GUIDE) test strip 1 each by Other route daily. 10/31/22   Kuneff, Renee A, DO  MAG64 64 MG TBEC TAKE 1 TABLET (64 MG TOTAL) BY MOUTH 2 (TWO) TIMES DAILY. 10/01/23   Kuneff, Renee A, DO  magnesium chloride (SLOW-MAG) 64 MG TBEC SR tablet Take 1 tablet (64 mg total) by mouth 2 (two) times daily. 09/22/23   Kuneff, Renee A, DO  metoprolol tartrate (LOPRESSOR) 50 MG tablet Take 1 tablet (50 mg total) by mouth daily. 12/22/23   Kuneff, Renee A, DO  mirabegron ER (MYRBETRIQ) 50 MG TB24 tablet Take 1 tablet (50 mg total) by mouth daily. 12/02/23   Kuneff, Renee A, DO  MOUNJARO 5 MG/0.5ML Pen Inject 5 mg into the skin once a week. 12/22/23   Kuneff, Renee A, DO  nitroGLYCERIN (NITROSTAT) 0.4 MG SL tablet Place 1 tablet (0.4 mg total) under the tongue every 5 (five) minutes as needed for chest pain (3 doses max). 06/10/19   Wendall Stade, MD  pantoprazole (PROTONIX) 40 MG tablet Take 1 tablet (40 mg total) by mouth 2 (two) times daily. 09/22/23   Kuneff, Renee A, DO  PARoxetine (PAXIL) 40 MG tablet Take 1 tablet (40 mg total)  by mouth daily. 12/22/23   Kuneff, Renee A, DO  SYMBICORT 160-4.5 MCG/ACT inhaler Inhale 2 puffs into the lungs 2 (two) times daily. 09/22/23   Kuneff, Renee A, DO  tamsulosin (FLOMAX) 0.4 MG CAPS capsule  09/28/20   [provider]  trazodone (DESYREL) 300 MG tablet Take 1 tablet (300 mg total) by mouth at bedtime. 12/22/23   Natalia Leatherwood, DO     Family History  Problem Relation Age of Onset   Heart disease Mother    CVA Mother    Dementia Mother    Coronary artery disease Father 1   Heart disease Father    Stroke Father    Dementia Sister    Cancer - Other Brother    Hypertension Other    Hyperlipidemia Other    Colon polyps Sister  Cancer Sister     Social History   Socioeconomic History   Marital status: Married    Spouse name: Not on file   Number of children: 2   Years of education: come college   Highest education level: GED or equivalent  Occupational History   Occupation: retired Chartered certified accountant  Tobacco Use   Smoking status: Former    Current packs/day: 0.00    Types: Cigarettes    Quit date: 11/25/1998    Years since quitting: 25.0   Smokeless tobacco: Never  Vaping Use   Vaping status: Never Used  Substance and Sexual Activity   Alcohol use: No   Drug use: No   Sexual activity: Not Currently  Other Topics Concern   Not on file  Social History Narrative   Lives at home with wife. Married to [name].   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    Drinks caffeine (3 cups coffee, 2 sodas per day),  Takes a daily vitamin, wears a seatbelt   Wears a bicycle helmet.    Has dentures (partial on top)   Smoke detector in the home. No firearms in the home.    Feels safe in relationships.     Right-handed.   Social Drivers of Corporate investment banker Strain: Low Risk  (12/19/2023)   Overall Financial Resource Strain (CARDIA)    Difficulty of Paying Living Expenses: Not very hard  Food Insecurity: No Food Insecurity (12/19/2023)   Hunger Vital Sign    Worried About  Running Out of Food in the Last Year: Never true    Ran Out of Food in the Last Year: Never true  Transportation Needs: No Transportation Needs (12/19/2023)   PRAPARE - Administrator, Civil Service (Medical): No    Lack of Transportation (Non-Medical): No  Physical Activity: Unknown (12/19/2023)   Exercise Vital Sign    Days of Exercise per Week: 0 days    Minutes of Exercise per Session: Not on file  Stress: Stress Concern Present (12/19/2023)   [employer] of Occupational Health - Occupational Stress Questionnaire    Feeling of Stress : To some extent  Social Connections: Unknown (12/19/2023)   Social Connection and Isolation Panel [NHANES]    Frequency of Communication with Friends and Family: More than three times a week    Frequency of Social Gatherings with Friends and Family: Patient declined    Attends Religious Services: Patient declined    Database administrator or Organizations: No    Attends Engineer, structural: Not on file    Marital Status: Married    Review of Systems: A 12 point ROS discussed and pertinent positives are indicated in the HPI above.  All other systems are negative.  Review of Systems  Genitourinary:  Positive for frequency.  All other systems reviewed and are negative.   Vital Signs: BP 127/84   Pulse 68   Temp 97.7 F (36.5 C) (Oral)   Resp 17   Ht 5\' 11"  (1.803 m)   Wt 208 lb (94.3 kg)   SpO2 97%   BMI 29.01 kg/m   Physical Exam Constitutional:      General: He is not in acute distress.    Appearance: He is not ill-appearing.  HENT:     Mouth/Throat:     Mouth: Mucous membranes are moist.     Pharynx: Oropharynx is clear.  Cardiovascular:     Rate and Rhythm: Normal rate.     Pulses: Normal pulses.  Pulmonary:     Effort: Pulmonary effort is normal.  Abdominal:     Tenderness: There is no abdominal tenderness.  Musculoskeletal:     Right lower leg: No edema.     Left lower leg: No edema.  Skin:     General: Skin is warm and dry.  Neurological:     Mental Status: He is alert and oriented to person, place, and time.  Psychiatric:        Mood and Affect: Mood normal.        Behavior: Behavior normal.        Thought Content: Thought content normal.        Judgment: Judgment normal.     Imaging: IR Radiologist Eval & Mgmt Result Date: 11/24/2023 EXAM: NEW PATIENT OFFICE VISIT CHIEF COMPLAINT: See Epic note. HISTORY OF PRESENT ILLNESS: See Epic note. REVIEW OF SYSTEMS: See Epic note. PHYSICAL EXAMINATION: See Epic note. ASSESSMENT AND PLAN: See Epic note. Marliss Coots, MD [facility] Radiology Electronically Signed   By: Marliss Coots M.D.   On: 11/24/2023 14:40    Labs:  CBC: Recent Labs    09/22/23 1355 12/24/23 0745  WBC 4.2 8.7  HGB 12.6* 13.1  HCT 39.1 39.7  PLT 205.0 200    COAGS: Recent Labs    12/24/23 0745  INR 1.0    BMP: Recent Labs    01/31/23 1307 09/22/23 1355 12/24/23 0745  NA 137 136 137  K 4.5 4.5 4.0  CL 99 100 101  CO2 25 26 23   GLUCOSE 251* 298* 158*  BUN 19 24* 21  CALCIUM 9.8 9.5 9.7  CREATININE 1.52* 1.49 1.60*  GFRNONAA  --   --  44*    LIVER FUNCTION TESTS: Recent Labs    09/22/23 1355  BILITOT 0.6  AST 16  ALT 23  ALKPHOS 75  PROT 6.7  ALBUMIN 4.2    TUMOR MARKERS: No results for input(s): "AFPTM", "CEA", "CA199", "CHROMGRNA" in the last 8760 hours.  Assessment and Plan:  Benign prostatic hyperplasia with lower urinary tract symptoms: [name], 78 year old male, presents today to the [facility] Interventional Radiology department for an image-guided prostate artery embolization.   Risks and benefits of this procedure were discussed with the patient including, but not limited to bleeding, infection, vascular injury or contrast induced renal failure.  This interventional procedure involves the use of X-rays and because of the nature of the planned procedure, it is  possible that we will have prolonged use of X-ray fluoroscopy.  Potential radiation risks to you include (but are not limited to) the following: - A slightly elevated risk for cancer  several years later in life. This risk is typically less than 0.5% percent. This risk is low in comparison to the normal incidence of human cancer, which is 33% for women and 50% for men according to the American Cancer Society. - Radiation induced injury can include skin redness, resembling a rash, tissue breakdown / ulcers and hair loss (which can be temporary or permanent).   The likelihood of either of these occurring depends on the difficulty of the procedure and whether you are sensitive to radiation due to previous procedures, disease, or genetic conditions.   IF your procedure requires a prolonged use of radiation, you will be notified and given written instructions for further action.  It is your responsibility to monitor the irradiated area for the 2 weeks following the procedure and to notify your physician  if you are concerned that you have suffered a radiation induced injury.    All of the patient's questions were answered, patient is agreeable to proceed. He has been NPO. He is a full code.   Consent signed and in chart.  Thank you for this interesting consult.  I greatly enjoyed meeting [name] and look forward to participating in their care.  A copy of this report was sent to the requesting provider on this date.  Electronically Signed: Alwyn Ren, AGACNP-BC 12/24/2023, 8:40 AM   I spent a total of  30 Minutes   in face to face in clinical consultation, greater than 50% of which was counseling/coordinating care for benign prostatic

## 2023-12-22 NOTE — Patient Instructions (Signed)

## 2023-12-22 NOTE — Progress Notes (Signed)
[name] , [birth year], 78 y.o., male MRN: [id number] Patient Care Team    Relationship Specialty Notifications Start End  Natalia Leatherwood, DO PCP - General Family Medicine  12/25/15   Wendall Stade, MD PCP - Cardiology Cardiology Admissions 08/20/18   Johney Maine, MD Consulting Physician Hematology  09/03/16   Wendall Stade, MD Consulting Physician Cardiology  09/03/16   Charlott Rakes, MD Consulting Physician Gastroenterology  09/03/16   Ihor Gully, MD (Inactive) Consulting Physician Urology  09/10/16   Hollar, Ronal Fear, MD Referring Physician Dermatology  06/29/19   Jill Side, OD Referring Physician   01/31/23     Chief Complaint  Patient presents with   Diabetes     Subjective: Pt presents for an OV for follow-up  Chronic Conditions/illness Management  Recurrent major depressive disorder, in partial remission (HCC)/memory changes He reports compliance with trazodone 200 mg nightly, Paxil 40 mg daily and Wellbutrin 300 mg daily. Zyprexa discontinued secondary to arthralgias. Prior note: Patient is present with his wife today. They report he is feeling better. He has not started the Flintstone vitamin. He is tolerating the magnesium supplement.He has more energy and less pain.  He stopped the zyprexa and started the higher dose trazodone 200 mg QHS. He is still not sleeping great, but ok for him.    Gastroesophageal reflux disease, esophagitis presence not specified/diarrhea/bloating  EGD 03/2016 by Dr. Bosie Clos with signs of barrett's esophagus. Patient was tried on lower dose medication, and was unable to tolerate secondary to return of symptoms.   His symptoms are well-controlled with protonix 40 BID and pepcid BID.    COPD [facility]) He uses Symbicort daily and feels it is working well for him.   Atherosclerosis of native coronary artery of native heart without angina pectoris/Essential hypertension/HYPERCHOLESTEROLEMIA/History of MI  (myocardial infarction)/IDA He has known CAD with prior LAD stent from 2007. Pt reports compliance with metoprolol 50 mg daily and amlodipine 5 mg daily Qd Patient denies chest pain, shortness of breath, dizziness or lower extremity edema.  Pt takes a daily Plavix. Pt is  prescribed statin and followed by Dr. Eden Emms Cardiology.  Diet: Low-sodium RF: Hypertension, hyperlipidemia, diabetes  Type 2 diabetes mellitus without complication, without long-term current use of insulin (HCC) patient reports compliance with Farxiga 10 mg daily and glipizide 10 mg BID.  Mounjaro 2.5 mg started last visit and he is tolerating He had diarrhea  off metformin trial as well.  We discontinued metformin regardless, to not cause the potential of worsening diarrhea.   Patient denies dizziness, hyperglycemic or hypoglycemic events. Patient denies numbness, tingling in the extremities or nonhealing wounds of feet.       12/22/2023    1:54 PM 09/22/2023    2:00 PM 06/09/2023    1:23 PM 02/14/2023    1:35 PM 01/31/2023    2:25 PM  Depression screen PHQ 2/9  Decreased Interest 1 2 0 0 0  Down, Depressed, Hopeless 1 1 1  0 0  PHQ - 2 Score 2 3 1  0 0  Altered sleeping 2 1 1     Tired, decreased energy 1 0 0    Change in appetite 1 1 1     Feeling bad or failure about yourself  0 1 0    Trouble concentrating 1 1 0    Moving slowly or fidgety/restless 0 1 0    Suicidal thoughts 0 0 0    PHQ-9 Score 7 8 3  Difficult doing work/chores Not difficult at all Not difficult at all         12/22/2023    1:55 PM 09/22/2023    2:01 PM 06/09/2023    2:05 PM 11/01/2022    1:15 PM  GAD 7 : Generalized Anxiety Score  Nervous, Anxious, on Edge 0 0 0 0  Control/stop worrying 0 1 0 1  Worry too much - different things 0 1 1 1   Trouble relaxing 0 0 1 1  Restless 0 0 0 1  Easily annoyed or irritable 0 0  1  Afraid - awful might happen 0 0 0 0  Total GAD 7 Score 0 2  5  Anxiety Difficulty Very difficult Not difficult at all      Allergies  Allergen Reactions   Altace [Ramipril] Cough   Latex Hives   Losartan Cough    cough   Remeron [Mirtazapine] Other (See Comments)    Cognitive changes   Tape     Blisters    Zantac [Ranitidine Hcl] Dermatitis   Codeine     Makes sick   Social History   Social History Narrative   Lives at home with wife. Married to [name].   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    Drinks caffeine (3 cups coffee, 2 sodas per day),  Takes a daily vitamin, wears a seatbelt   Wears a bicycle helmet.    Has dentures (partial on top)   Smoke detector in the home. No firearms in the home.    Feels safe in relationships.     Right-handed.   Past Medical History:  Diagnosis Date   Achilles tendinitis of left lower extremity 07/20/2018   Adenomatous colon polyp 2011   Barrett esophagus 12/2013   EGD   CAD (coronary artery disease)    ETT/Lexiscan-Myoview (11/14):  Normal; no ischemia, EF 67%   CKD (chronic kidney disease), stage III (HCC)    Colon polyp 09/2010   adenoma   COPD (chronic obstructive pulmonary disease) (HCC) 2009   spirometry with "moderate COPD"   Depression    Diabetes mellitus without complication (HCC)    Dyspnea    GERD (gastroesophageal reflux disease)    GI bleed    after polypectomy/admitted   Hair loss 08/07/2018   HTN (hypertension)    Hypercholesterolemia    Hypomagnesemia 09/15/2022   Memory loss    Myocardial infarction (HCC)    Obesity    Pneumonia 09/15/2022   Sleep apnea    Spontaneous pneumothorax    Synovial cyst of right popliteal space 07/20/2018   Vertigo    Past Surgical History:  Procedure Laterality Date   APPENDECTOMY     CORONARY ANGIOPLASTY WITH STENT PLACEMENT  2007   LAD   IR RADIOLOGIST EVAL & MGMT  11/24/2023   PLEURAL SCARIFICATION  1987   TONSILLECTOMY     VARICOCELECTOMY     Family History  Problem Relation Age of Onset   Heart disease Mother    CVA Mother    Dementia Mother    Coronary artery disease Father 32   Heart  disease Father    Stroke Father    Dementia Sister    Cancer - Other Brother    Hypertension Other    Hyperlipidemia Other    Colon polyps Sister    Cancer Sister    Allergies as of 12/22/2023       Reactions   Altace [ramipril] Cough   Latex Hives   Losartan Cough  cough   Remeron [mirtazapine] Other (See Comments)   Cognitive changes   Tape    Blisters   Zantac [ranitidine Hcl] Dermatitis   Codeine    Makes sick        Medication List        Accurate as of December 22, 2023  2:21 PM. If you have any questions, ask your nurse or doctor.          STOP taking these medications    tirzepatide 2.5 MG/0.5ML Pen Commonly known as: MOUNJARO Replaced by: Mounjaro 5 MG/0.5ML Pen Stopped by: Felix Pacini       TAKE these medications    Accu-Chek Guide test strip Generic drug: glucose blood 1 each by Other route daily.   amLODipine 5 MG tablet Commonly known as: NORVASC Take 1 tablet (5 mg total) by mouth daily.   atorvastatin 80 MG tablet Commonly known as: LIPITOR Take 1 tablet (80 mg total) by mouth daily.   blood glucose meter kit and supplies Dispense based on patient and insurance preference. Use up to three times daily as directed. (FOR ICD-10 E10.9, E11.9).   buPROPion 300 MG 24 hr tablet Commonly known as: WELLBUTRIN XL Take 1 tablet (300 mg total) by mouth daily.   ciprofloxacin 500 MG tablet Commonly known as: Cipro Take 1 tablet (500 mg total) by mouth 2 (two) times daily for 7 days.   clopidogrel 75 MG tablet Commonly known as: PLAVIX Take 1 tablet (75 mg total) by mouth daily.   dapagliflozin propanediol 10 MG Tabs tablet Commonly known as: Farxiga Take 1 tablet (10 mg total) by mouth daily before breakfast.   famotidine 20 MG tablet Commonly known as: Pepcid Take 1 tablet (20 mg total) by mouth 2 (two) times daily.   finasteride 5 MG tablet Commonly known as: PROSCAR Take 5 mg by mouth daily.   glipiZIDE 10 MG  tablet Commonly known as: GLUCOTROL 1 tab with dinner What changed:  how much to take how to take this when to take this additional instructions Changed by: Sera Hitsman   Mag64 64 MG Tbec Generic drug: Magnesium Chloride TAKE 1 TABLET (64 MG TOTAL) BY MOUTH 2 (TWO) TIMES DAILY.   magnesium chloride 64 MG Tbec SR tablet Commonly known as: SLOW-MAG Take 1 tablet (64 mg total) by mouth 2 (two) times daily.   methylPREDNISolone 4 MG Tbpk tablet Commonly known as: MEDROL DOSEPAK Dispense per package instructions   metoprolol tartrate 50 MG tablet Commonly known as: LOPRESSOR Take 1 tablet (50 mg total) by mouth daily.   mirabegron ER 50 MG Tb24 tablet Commonly known as: Myrbetriq Take 1 tablet (50 mg total) by mouth daily.   Mounjaro 5 MG/0.5ML Pen Generic drug: tirzepatide Inject 5 mg into the skin once a week. Replaces: tirzepatide 2.5 MG/0.5ML Pen Started by: Felix Pacini   nitroGLYCERIN 0.4 MG SL tablet Commonly known as: NITROSTAT Place 1 tablet (0.4 mg total) under the tongue every 5 (five) minutes as needed for chest pain (3 doses max).   pantoprazole 40 MG tablet Commonly known as: PROTONIX Take 1 tablet (40 mg total) by mouth 2 (two) times daily.   PARoxetine 40 MG tablet Commonly known as: PAXIL Take 1 tablet (40 mg total) by mouth daily.   phenazopyridine 100 MG tablet Commonly known as: PYRIDIUM Take 1 tablet (100 mg total) by mouth in the morning, at noon, and at bedtime for 7 days.   solifenacin 5 MG tablet Commonly known as: VESICARE Take  1 tablet (5 mg total) by mouth daily for 7 days.   Symbicort 160-4.5 MCG/ACT inhaler Generic drug: budesonide-formoterol Inhale 2 puffs into the lungs 2 (two) times daily.   tamsulosin 0.4 MG Caps capsule Commonly known as: FLOMAX   trazodone 300 MG tablet Commonly known as: DESYREL Take 1 tablet (300 mg total) by mouth at bedtime.   VITAMIN B 12 PO Take by mouth.        All past medical history,  surgical history, allergies, family history, immunizations andmedications were updated in the EMR today and reviewed under the history and medication portions of their EMR.     ROS Negative, with the exception of above mentioned in HPI  Objective:  BP 118/70   Pulse 70   Temp 98 F (36.7 C)   Wt 211 lb 3.2 oz (95.8 kg)   SpO2 96%   BMI 29.46 kg/m  Body mass index is 29.46 kg/m. Physical Exam Vitals and nursing note reviewed.  Constitutional:      General: He is not in acute distress.    Appearance: Normal appearance. He is not ill-appearing, toxic-appearing or diaphoretic.  HENT:     Head: Normocephalic and atraumatic.  Eyes:     General: No scleral icterus.       Right eye: No discharge.        Left eye: No discharge.     Extraocular Movements: Extraocular movements intact.     Pupils: Pupils are equal, round, and reactive to light.  Cardiovascular:     Rate and Rhythm: Normal rate and regular rhythm.  Pulmonary:     Effort: Pulmonary effort is normal. No respiratory distress.     Breath sounds: Normal breath sounds. No wheezing, rhonchi or rales.  Musculoskeletal:     Cervical back: Neck supple.     Right lower leg: No edema.     Left lower leg: No edema.  Lymphadenopathy:     Cervical: No cervical adenopathy.  Skin:    General: Skin is warm and dry.     Coloration: Skin is not jaundiced or pale.     Findings: No rash.  Neurological:     Mental Status: He is alert and oriented to person, place, and time. Mental status is at baseline.  Psychiatric:        Mood and Affect: Mood normal.        Behavior: Behavior normal.        Thought Content: Thought content normal.        Judgment: Judgment normal.     No results found. No results found. Results for orders placed or performed in visit on 12/22/23 (from the past 24 hours)  POCT HgB A1C     Status: Abnormal   Collection Time: 12/22/23  1:50 PM  Result Value Ref Range   Hemoglobin A1C 6.9 (A) 4.0 - 5.6 %    HbA1c POC (<> result, manual entry) 6.9 4.0 - 5.6 %   HbA1c, POC (prediabetic range) 6.9 (A) 5.7 - 6.4 %   HbA1c, POC (controlled diabetic range) 6.9 0.0 - 7.0 %    Assessment/Plan: [name] is a 78 y.o. male present for OV for  Recurrent major depressive disorder, in partial remission (HCC) Stable Continue paxil to 40 mg Continue trazodone 300 qhs  Continue wellbutrin 300 QD. -Tried meds: Remeron (possible memory changes), zyprexa (arthralgias) - Follow-up 4 months  Gastroesophageal reflux disease, esophagitis presence not specified Stable Continue  Protonix 40 mg twice a  day, was unable to take back secondary to recurrence of symptoms. Continue  Pepcid twice daily today B12, vitamin D and mag up-to-date  Chronic bronchitis, unspecified chronic bronchitis type (HCC) Stable continue Symbicort twice daily  Atherosclerosis of native coronary artery of native heart without angina pectoris/Essential hypertension/HYPERCHOLESTEROLEMIA/History of MI (myocardial infarction) Stage 3 chronic kidney disease/ iron deficiency anemia/acquired thrombophilia stable - following with cards, Dr. Eden Emms. Continue  atorvastatin Continue  metoprolol 50 mg Continue amlodipine to 5 mg daily -  plavix continue per cardiology - low sodium diet and exercise encouraged.   Type 2 diabetes mellitus with hyperlipidemia/CKD 3 - A1c today 5.8--> 6.0-->6.1>>6.7> 6.5> 7.1 >7.1> 6.7> 6.8 >7.8> 7.5> 8.1 > 8.3 > 9.3> 8.2 > 8.6>6.9 today!!! Goal of A1c less than 7 Continue Farxiga 10 mg daily (medvtx) DECREASE glipizide 10 every day with dinner INCREASE mounjaro 5 mg weekly.  Tried: Metformin-discontinued secondary to diarrhea, [name]-patient has reservations about using since hospitalization for COVID-pneumonia and [name] occurring at the same time. Continue atorvastatin 80 - Foot exam: Completed 09/22/2023 - eye exam: Completed 05/2023 - PNA: Series completed - Flu: Completed 09/22/2023 - Urine  Microalbumin w/creat. Ratio: completed 06/09/2023  Cerebral atrophy/microvascular ischemic occlusive disease Patient is having mild cognitive changes. Referral had been placed to neurology Patient is on a statin  Iron deficiency anemia: Iron panel is up-to-date.  Patient cannot tolerate iron supplement, was encouraged to at least start Flintstone vitamin  Nocturia Encouraged him to follow-up with his urologist.   Continue Flomax prescribed by urology.  Continue Myrbetriq to 50 mg  Scheduled for surgery this week  Reviewed expectations re: course of current medical issues. Discussed self-management of symptoms. Outlined signs and symptoms indicating need for more acute intervention. Patient verbalized understanding and all questions were answered. Patient received an After-Visit Summary.    Orders Placed This Encounter  Procedures   POCT HgB A1C   Meds ordered this encounter  Medications   PARoxetine (PAXIL) 40 MG tablet    Sig: Take 1 tablet (40 mg total) by mouth daily.    Dispense:  90 tablet    Refill:  1   trazodone (DESYREL) 300 MG tablet    Sig: Take 1 tablet (300 mg total) by mouth at bedtime.    Dispense:  90 tablet    Refill:  1   metoprolol tartrate (LOPRESSOR) 50 MG tablet    Sig: Take 1 tablet (50 mg total) by mouth daily.    Dispense:  90 tablet    Refill:  1   glipiZIDE (GLUCOTROL) 10 MG tablet    Sig: 1 tab with dinner    Dispense:  90 tablet    Refill:  1   famotidine (PEPCID) 20 MG tablet    Sig: Take 1 tablet (20 mg total) by mouth 2 (two) times daily.    Dispense:  180 tablet    Refill:  3   dapagliflozin propanediol (FARXIGA) 10 MG TABS tablet    Sig: Take 1 tablet (10 mg total) by mouth daily before breakfast.    Dispense:  90 tablet    Refill:  2   buPROPion (WELLBUTRIN XL) 300 MG 24 hr tablet    Sig: Take 1 tablet (300 mg total) by mouth daily.    Dispense:  90 tablet    Refill:  1   amLODipine (NORVASC) 5 MG tablet    Sig: Take 1  tablet (5 mg total) by mouth daily.    Dispense:  90 tablet  Refill:  1   MOUNJARO 5 MG/0.5ML Pen    Sig: Inject 5 mg into the skin once a week.    Dispense:  2 mL    Refill:  5   Referral Orders  No referral(s) requested today      Note is dictated utilizing voice recognition software. Although note has been proof read prior to signing, occasional typographical errors still can be missed. If any questions arise, please do not hesitate to call for verification.   electronically signed by:  Felix Pacini, DO  [facility] [place]

## 2023-12-23 MED ORDER — LIDOCAINE HCL URETHRAL/MUCOSAL 2 % EX GEL
1.0000 | Freq: Once | CUTANEOUS | Status: DC
  Filled 2023-12-23: qty 6

## 2023-12-24 ENCOUNTER — Ambulatory Visit (HOSPITAL_COMMUNITY)
Admission: RE | Admit: 2023-12-24 | Discharge: 2023-12-24 | Disposition: A | Discharge status: Home / Self Care | Payer: Medicare HMO | Source: Ambulatory Visit | Attending: Interventional Radiology

## 2023-12-24 ENCOUNTER — Other Ambulatory Visit (HOSPITAL_COMMUNITY): Payer: Self-pay | Admitting: Interventional Radiology

## 2023-12-24 DIAGNOSIS — E1122 Type 2 diabetes mellitus with diabetic chronic kidney disease: Secondary | ICD-10-CM | POA: Insufficient documentation

## 2023-12-24 DIAGNOSIS — N138 Other obstructive and reflux uropathy: Secondary | ICD-10-CM

## 2023-12-24 DIAGNOSIS — I129 Hypertensive chronic kidney disease with stage 1 through stage 4 chronic kidney disease, or unspecified chronic kidney disease: Secondary | ICD-10-CM | POA: Insufficient documentation

## 2023-12-24 DIAGNOSIS — N183 Chronic kidney disease, stage 3 unspecified: Secondary | ICD-10-CM | POA: Insufficient documentation

## 2023-12-24 DIAGNOSIS — I252 Old myocardial infarction: Secondary | ICD-10-CM | POA: Insufficient documentation

## 2023-12-24 DIAGNOSIS — Z7985 Long-term (current) use of injectable non-insulin antidiabetic drugs: Secondary | ICD-10-CM | POA: Insufficient documentation

## 2023-12-24 DIAGNOSIS — N401 Enlarged prostate with lower urinary tract symptoms: Secondary | ICD-10-CM | POA: Insufficient documentation

## 2023-12-24 DIAGNOSIS — Z7902 Long term (current) use of antithrombotics/antiplatelets: Secondary | ICD-10-CM | POA: Insufficient documentation

## 2023-12-24 DIAGNOSIS — Z87891 Personal history of nicotine dependence: Secondary | ICD-10-CM | POA: Diagnosis not present

## 2023-12-24 DIAGNOSIS — I2510 Atherosclerotic heart disease of native coronary artery without angina pectoris: Secondary | ICD-10-CM | POA: Diagnosis not present

## 2023-12-24 DIAGNOSIS — Z7984 Long term (current) use of oral hypoglycemic drugs: Secondary | ICD-10-CM | POA: Insufficient documentation

## 2023-12-24 HISTORY — PX: IR ANGIOGRAM SELECTIVE EACH ADDITIONAL VESSEL: IMG667

## 2023-12-24 HISTORY — PX: IR US GUIDE VASC ACCESS RIGHT: IMG2390

## 2023-12-24 HISTORY — PX: IR EMBO ARTERIAL NOT HEMORR HEMANG INC GUIDE ROADMAPPING: IMG5448

## 2023-12-24 HISTORY — PX: IR US GUIDE VASC ACCESS LEFT: IMG2389

## 2023-12-24 HISTORY — PX: IR ANGIOGRAM PELVIS SELECTIVE OR SUPRASELECTIVE: IMG661

## 2023-12-24 LAB — BASIC METABOLIC PANEL
Anion gap: 13 (ref 5–15)
BUN: 21 mg/dL (ref 8–23)
CO2: 23 mmol/L (ref 22–32)
Calcium: 9.7 mg/dL (ref 8.9–10.3)
Chloride: 101 mmol/L (ref 98–111)
Creatinine, Ser: 1.6 mg/dL — ABNORMAL HIGH (ref 0.61–1.24)
GFR, Estimated: 44 mL/min — ABNORMAL LOW (ref >60)
Glucose, Bld: 158 mg/dL — ABNORMAL HIGH (ref 70–99)
Potassium: 4 mmol/L (ref 3.5–5.1)
Sodium: 137 mmol/L (ref 135–145)

## 2023-12-24 LAB — GLUCOSE, CAPILLARY
Glucose-Capillary: 158 mg/dL — ABNORMAL HIGH (ref 70–99)
Glucose-Capillary: 151 mg/dL — ABNORMAL HIGH (ref 70–99)

## 2023-12-24 LAB — CBC
HCT: 39.7 % (ref 39.0–52.0)
Hemoglobin: 13.1 g/dL (ref 13.0–17.0)
MCH: 27.5 pg (ref 26.0–34.0)
MCHC: 33 g/dL (ref 30.0–36.0)
MCV: 83.4 fL (ref 80.0–100.0)
Platelets: 200 10*3/uL (ref 150–400)
RBC: 4.76 MIL/uL (ref 4.22–5.81)
RDW: 15.2 % (ref 11.5–15.5)
WBC: 8.7 10*3/uL (ref 4.0–10.5)
nRBC: 0 % (ref 0.0–0.2)

## 2023-12-24 LAB — PROTIME-INR
INR: 1 (ref 0.8–1.2)
Prothrombin Time: 13.6 s (ref 11.4–15.2)

## 2023-12-24 MED ORDER — MIDAZOLAM HCL 2 MG/2ML IJ SOLN
INTRAMUSCULAR | Status: AC
  Filled 2023-12-24: qty 2

## 2023-12-24 MED ORDER — MIDAZOLAM HCL 2 MG/2ML IJ SOLN
INTRAMUSCULAR | Status: AC | PRN
  Administered 2023-12-24 (×2): .5 mg via INTRAVENOUS
  Administered 2023-12-24: 1 mg via INTRAVENOUS
  Administered 2023-12-24: .5 mg via INTRAVENOUS

## 2023-12-24 MED ORDER — VERAPAMIL HCL 2.5 MG/ML IV SOLN
INTRAVENOUS | Status: AC
  Filled 2023-12-24: qty 2

## 2023-12-24 MED ORDER — NITROGLYCERIN 1 MG/10 ML FOR IR/CATH LAB
INTRA_ARTERIAL | Status: AC
  Filled 2023-12-24: qty 10

## 2023-12-24 MED ORDER — CIPROFLOXACIN IN D5W 400 MG/200ML IV SOLN
400.0000 mg | INTRAVENOUS | Status: AC
  Administered 2023-12-24: 400 mg via INTRAVENOUS
  Filled 2023-12-24: qty 200

## 2023-12-24 MED ORDER — FENTANYL CITRATE (PF) 100 MCG/2ML IJ SOLN
INTRAMUSCULAR | Status: AC | PRN
Start: 2023-12-24 — End: 2023-12-24
  Administered 2023-12-24 (×3): 25 ug via INTRAVENOUS
  Administered 2023-12-24: 50 ug via INTRAVENOUS

## 2023-12-24 MED ORDER — FENTANYL CITRATE (PF) 100 MCG/2ML IJ SOLN
INTRAMUSCULAR | Status: AC
  Filled 2023-12-24: qty 2

## 2023-12-24 MED ORDER — IOHEXOL 300 MG/ML  SOLN
150.0000 mL | Freq: Once | INTRAMUSCULAR | Status: AC | PRN
  Administered 2023-12-24: 25 mL via INTRA_ARTERIAL

## 2023-12-24 MED ORDER — LIDOCAINE-PRILOCAINE 2.5-2.5 % EX CREA
TOPICAL_CREAM | Freq: Once | CUTANEOUS | Status: AC
  Filled 2023-12-24: qty 5

## 2023-12-24 MED ORDER — PREDNISONE 20 MG PO TABS
20.0000 mg | ORAL_TABLET | Freq: Once | ORAL | Status: AC
  Administered 2023-12-24: 20 mg via ORAL
  Filled 2023-12-24: qty 1

## 2023-12-24 MED ORDER — SODIUM CHLORIDE (PF) 0.9 % IJ SOLN
INTRAVENOUS | Status: AC | PRN
  Administered 2023-12-24 (×3): 100 ug via INTRA_ARTERIAL

## 2023-12-24 MED ORDER — HEPARIN SODIUM (PORCINE) 1000 UNIT/ML IJ SOLN
INTRAMUSCULAR | Status: AC
  Filled 2023-12-24: qty 10

## 2023-12-24 MED ORDER — VERAPAMIL HCL 2.5 MG/ML IV SOLN
INTRAVENOUS | Status: AC | PRN
  Administered 2023-12-24: 2.5 mg via INTRA_ARTERIAL

## 2023-12-24 MED ORDER — IOHEXOL 300 MG/ML  SOLN
100.0000 mL | Freq: Once | INTRAMUSCULAR | Status: AC | PRN
  Administered 2023-12-24: 75 mL via INTRA_ARTERIAL

## 2023-12-24 MED ORDER — LIDOCAINE-EPINEPHRINE 1 %-1:100000 IJ SOLN
INTRAMUSCULAR | Status: AC
  Filled 2023-12-24: qty 1

## 2023-12-24 MED ORDER — HEPARIN SODIUM (PORCINE) 1000 UNIT/ML IJ SOLN
INTRAMUSCULAR | Status: AC | PRN
  Administered 2023-12-24: 3000 [IU] via INTRAVENOUS

## 2023-12-24 MED ORDER — CHLORHEXIDINE GLUCONATE CLOTH 2 % EX PADS: 6.0000 | MEDICATED_PAD | Freq: Every day | CUTANEOUS | Status: DC

## 2023-12-24 MED ORDER — IODIXANOL 320 MG/ML IV SOLN
100.0000 mL | Freq: Once | INTRAVENOUS | Status: AC | PRN
  Administered 2023-12-24: 3 mL via INTRA_ARTERIAL

## 2023-12-24 MED ORDER — NITROGLYCERIN 2 % TD OINT
1.0000 [in_us] | TOPICAL_OINTMENT | Freq: Once | TRANSDERMAL | Status: AC
  Administered 2023-12-24: 1 [in_us] via TOPICAL
  Filled 2023-12-24: qty 1

## 2023-12-24 NOTE — Procedures (Signed)
Interventional Radiology Procedure Note  Procedure: Prostate artery embolization   Findings: Please refer to procedural dictation for full description. Left radial artery access, TR band applied at 11:53 with 17 cc air.    Complications: None immediate  Estimated Blood Loss: < 5 mL  Recommendations: TR band release protocol. Discontinue Foley catheter. IR will arrange 1 month clinic follow up.   Marliss Coots, MD

## 2023-12-24 NOTE — Discharge Instructions (Signed)

## 2023-12-24 NOTE — Sedation Documentation (Signed)
Patient transported to recovery area via stretcher. Bedside report given to RN. Radial site assessed - TR Band in place (17cc of air), no hematoma, dressing is clean, dry, and intact. Pulses assessed with RN.

## 2023-12-30 ENCOUNTER — Other Ambulatory Visit: Payer: Self-pay

## 2023-12-30 ENCOUNTER — Ambulatory Visit (HOSPITAL_COMMUNITY): Payer: Medicare HMO

## 2023-12-30 DIAGNOSIS — E1169 Type 2 diabetes mellitus with other specified complication: Secondary | ICD-10-CM

## 2023-12-30 MED ORDER — MOUNJARO 5 MG/0.5ML ~~LOC~~ SOAJ: 5.0000 mg | SUBCUTANEOUS | 5 refills | Status: DC

## 2023-12-31 ENCOUNTER — Other Ambulatory Visit: Payer: Self-pay | Admitting: Interventional Radiology

## 2023-12-31 DIAGNOSIS — N401 Enlarged prostate with lower urinary tract symptoms: Secondary | ICD-10-CM

## 2024-01-08 ENCOUNTER — Other Ambulatory Visit: Payer: Self-pay | Admitting: Pharmacist

## 2024-01-12 ENCOUNTER — Encounter (HOSPITAL_COMMUNITY): Payer: Self-pay

## 2024-01-21 ENCOUNTER — Ambulatory Visit (HOSPITAL_COMMUNITY): Payer: Medicare HMO

## 2024-01-23 ENCOUNTER — Telehealth (HOSPITAL_COMMUNITY): Payer: Self-pay | Admitting: *Deleted

## 2024-01-23 NOTE — Telephone Encounter (Signed)
 Left message on voicemail per DPR in reference to upcoming appointment scheduled on 01/28/2024 at 12:45 with detailed instructions given per Myocardial Perfusion Study Information Sheet for the test. LM to arrive 15 minutes early, and that it is imperative to arrive on time for appointment to keep from having the test rescheduled. If you need to cancel or reschedule your appointment, please call the office within 24 hours of your appointment. Failure to do so may result in a cancellation of your appointment, and a $50 no show fee. Phone number given for call back for any questions.

## 2024-01-25 ENCOUNTER — Other Ambulatory Visit: Payer: Self-pay | Admitting: Cardiovascular Disease

## 2024-01-26 ENCOUNTER — Telehealth: Payer: Self-pay

## 2024-01-26 NOTE — Telephone Encounter (Signed)
 Left message that test would be canceled. Asked patient to call back for information.

## 2024-01-26 NOTE — Telephone Encounter (Signed)
 ----- Message from Charlton Haws sent at 01/26/2024  2:44 PM EST ----- Regarding: RE: AUTH DENIAL Can cancel Pam tell patient it was denied and let us know if he has any chest pain with activity ----- Message ----- From: Francine Graven Sent: 01/26/2024   1:29 PM EST To: Wendall Stade, MD; Chesley Mires; # Subject: FW: AUTH DENIAL                                Good afternoon,   [employer] has upheld the denial of procedure. When I asked for proof with another letter being sent out, she said it's not medically necessary and gave me a reference# that I noted in the appt notes.  Can pt's appt be taken off the schedule?   Thanks,  Hermine Messick ----- Message ----- From: Francine Graven Sent: 01/16/2024   2:35 PM EST To: Wendall Stade, MD; Ethelda Chick, RN Subject: RE: Ollen Bowl                                Hello,   I've faxed an appeal. Will keep you posted on decision.   Thanks,  Anisah ----- Message ----- From: Wendall Stade, MD Sent: 01/15/2024   6:40 PM EST To: Ethelda Chick, RN; # Subject: RE: Ollen Bowl                                He has CAD with prior LAD stent and poorly controlled DM and has not had a stress test since 2014 not clear why that would be denied ----- Message ----- From: Francine Graven Sent: 01/15/2024   1:51 PM EST To: Wendall Stade, MD; Chesley Mires; # Subject: AUTH DENIAL                                    Hello,   [employer] has denied pt's auth for nuclear stress test. Can we take pt's appt off the schedule until we find a resolve? Following are the details of denial.    We denied the medical services/items listed above because:  Per Medical Director review of the applicable medical guidelines, we have determined that your request does not meet medical requirements and, therefore, cannot be approved. Medicare will pay for services that it considers to be reasonable and necessary. Current  medical guidelines indicate that this service is considered reasonable and necessary when you meet the following requirement(s): I. Cardiac Radionuclide Imaging (an imaging test that uses a special medication to show how blood goes to the heart at rest and during exercise) will be considered medically necessary when the following are met: A. Myocardial Perfusion Imaging (MPI, a test to show how well blood flows through the heart muscle while under stress) for the following conditions: 1. Chronic ischemic heart disease (ongoing, long term heart weakening caused by reduced blood flow to the heart) - The use of Myocardial Perfusion Imaging (MPI, a test to show how well blood flows through the heart muscle while under stress) is well established in the diagnosis and management of Coronary Artery Disease (CAD, damage or disease in the heart's major blood vessels) and is covered for these situations:  a. Diagnosis  of Coronary Artery Disease (CAD, damage or disease in the heart's major blood vessels), especially in patients with atypical chest pain (any chest pain [pain in the chest due to reduced blood flow] that doesn't meet criteria for a common or obvious diagnosis). II. Cardiac Radionuclide Imaging (an imaging test that uses a special medication to show how blood goes to the heart at rest and during exercise) is NOT be considered medically necessary under the following situations: A. Use of pharmacologic agents (medication) in myocardial perfusion imaging (a medical test that shows how well blood is flowing to your heart muscle) unless exercise is not possible. We can't approve your request. Your notes don't show chest pain. Your notes also don't show you can't exercise.  This decision is based on the following Medicare guidelines: 1. Local Coverage Determination (LCD): Cardiac Radionuclide Imaging (B14782) Revision 21. 2. Title XVIII of the Social Security Act, Section 1862(a)(1)(A). 3. 42  CFR 411.15 - Particular services excluded from coverage.   Thanks,  [employer]

## 2024-01-28 ENCOUNTER — Encounter (HOSPITAL_COMMUNITY): Payer: Self-pay

## 2024-01-28 ENCOUNTER — Ambulatory Visit (HOSPITAL_COMMUNITY): Payer: Medicare HMO

## 2024-01-28 NOTE — Telephone Encounter (Signed)
 [employer] is calling to get additional information for patient.   Requesting call back at [phone] ext [phone]

## 2024-01-29 NOTE — Telephone Encounter (Signed)
 [place] with [employer] called in about appeal request asking to speak with Anisah. She asked if this appeal was filed on behalf of [facility] or on behalf of the provider, Dr. Eden Emms. Please advise.

## 2024-02-04 DIAGNOSIS — C441292 Squamous cell carcinoma of skin of left lower eyelid, including canthus: Secondary | ICD-10-CM | POA: Diagnosis not present

## 2024-02-23 ENCOUNTER — Ambulatory Visit: Payer: Self-pay | Admitting: Family Medicine

## 2024-02-23 NOTE — Telephone Encounter (Signed)
 No further action needed.
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
## 2024-02-24 ENCOUNTER — Encounter: Payer: Self-pay | Admitting: Family Medicine

## 2024-02-24 ENCOUNTER — Ambulatory Visit (INDEPENDENT_AMBULATORY_CARE_PROVIDER_SITE_OTHER): Admitting: Family Medicine

## 2024-02-24 VITALS — BP 120/77 | HR 76 | Temp 98.2°F | Wt 205.6 lb

## 2024-02-24 DIAGNOSIS — G319 Degenerative disease of nervous system, unspecified: Secondary | ICD-10-CM | POA: Diagnosis not present

## 2024-02-24 DIAGNOSIS — I679 Cerebrovascular disease, unspecified: Secondary | ICD-10-CM | POA: Diagnosis not present

## 2024-02-24 DIAGNOSIS — R443 Hallucinations, unspecified: Secondary | ICD-10-CM | POA: Diagnosis not present

## 2024-02-24 DIAGNOSIS — R4189 Other symptoms and signs involving cognitive functions and awareness: Secondary | ICD-10-CM | POA: Diagnosis not present

## 2024-02-24 DIAGNOSIS — I6782 Cerebral ischemia: Secondary | ICD-10-CM

## 2024-02-24 LAB — POC URINALSYSI DIPSTICK (AUTOMATED)
Bilirubin, UA: NEGATIVE
Glucose, UA: POSITIVE — AB
Ketones, UA: NEGATIVE
Leukocytes, UA: NEGATIVE
Nitrite, UA: NEGATIVE
Protein, UA: NEGATIVE
Spec Grav, UA: 1.015 (ref 1.010–1.025)
Urobilinogen, UA: NEGATIVE U/dL — AB
pH, UA: 6 (ref 5.0–8.0)

## 2024-02-24 NOTE — Progress Notes (Signed)
 [name] , 01/17/1946, 78 y.o., male MRN: [id number] Patient Care Team    Relationship Specialty Notifications Start End  Natalia Leatherwood, DO PCP - General Family Medicine  12/25/15   Wendall Stade, MD PCP - Cardiology Cardiology Admissions 08/20/18   Johney Maine, MD Consulting Physician Hematology  09/03/16   Wendall Stade, MD Consulting Physician Cardiology  09/03/16   Charlott Rakes, MD Consulting Physician Gastroenterology  09/03/16   Hollar, Ronal Fear, MD Referring Physician Dermatology  06/29/19   Jill Side, OD Referring Physician   01/31/23   Crista Elliot, MD Consulting Physician Urology  02/24/24     Chief Complaint  Patient presents with   Memory Loss    Pt and pt's daughter states over the last few years pt has been losing memory; short term over long term. Also states pt has recently been experiencing hallucinations; seeing people that are not there.       Subjective: [name] is a 78 y.o. Pt presents for an OV with his wife, and they bring their daughter today, to discuss patient's cognitive changes.  His family states that he has been having more frequent visual and auditory hallucinations. Example of the visual hallucinations they give, is that he saw 2 men in his yard while he was out working in the yard with his son.  He reported one of them today wait lab coat on, "like a doctor." Example of the auditory hallucinations given today is he feels he hears people talking in the next room, but there is nobody there.  He states he thinks it is because the TV is loud, his wife reports this occurs even when the TV is not an issue. When the patient leaves the room for labs, patient's wife and daughter state that he has become very paranoid and accuses his wife of misplacing or moving objects to keep them from him, including keys, money etc.  He is misplacing objects frequently.  He will watch her, and go on their reading security cameras and  ask her who she was with stating that he saw more than 1 person in the car excetra.  He has verbally lashed out at her on many occasions, causing her a great deal of sadness and stress.  Patient is a diabetic, prescribed Farxiga, Mounjaro, glipizide.  Per family patient does not eat well, but the symptoms of hallucinations and cognitive changes are not necessarily occurring any particular time of the day, or associated with meals/lack of meals. Patient reports he struggles with routine bowel movements, possibly has a bowel movement every 1 to 2 days, he is not sure.  Initially he states he had his "1 a month.  " Patient does not drink water or hydrate well.  He drinks soda.  Patient had a recent surgery to help with his urinary frequency/BPH.  Surgery was completed December 24, 2023.  He states initially he thought it did help, but now he is back to having urinary frequency.  Patient has a longstanding history of severe depression and anxiety.  Multiple medication regimens have been tried with side effects.  Currently prescribed trazodone 300 mg nightly, with reports of still not sleeping well.  Paxil 40 mg daily  There is a family history of dementia in his mother, he is uncertain what type or onset.  He also feels some of his siblings had memory changes, but he is not certain.  12/22/2023    1:54 PM 09/22/2023    2:00 PM 06/09/2023    1:23 PM 02/14/2023    1:35 PM 01/31/2023    2:25 PM  Depression screen PHQ 2/9  Decreased Interest 1 2 0 0 0  Down, Depressed, Hopeless 1 1 1  0 0  PHQ - 2 Score 2 3 1  0 0  Altered sleeping 2 1 1     Tired, decreased energy 1 0 0    Change in appetite 1 1 1     Feeling bad or failure about yourself  0 1 0    Trouble concentrating 1 1 0    Moving slowly or fidgety/restless 0 1 0    Suicidal thoughts 0 0 0    PHQ-9 Score 7 8 3     Difficult doing work/chores Not difficult at all Not difficult at all       Allergies  Allergen Reactions   Altace [Ramipril]  Cough   Latex Hives   Losartan Cough    cough   Remeron [Mirtazapine] Other (See Comments)    Cognitive changes   Tape     Blisters    Zantac [Ranitidine Hcl] Dermatitis   Codeine     Makes sick   Social History   Social History Narrative   Lives at home with wife. Married to [name].   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    Drinks caffeine (3 cups coffee, 2 sodas per day),  Takes a daily vitamin, wears a seatbelt   Wears a bicycle helmet.    Has dentures (partial on top)   Smoke detector in the home. No firearms in the home.    Feels safe in relationships.     Right-handed.   Past Medical History:  Diagnosis Date   Achilles tendinitis of left lower extremity 07/20/2018   Adenomatous colon polyp 2011   Barrett esophagus 12/2013   EGD   CAD (coronary artery disease)    ETT/Lexiscan-Myoview (11/14):  Normal; no ischemia, EF 67%   CKD (chronic kidney disease), stage III (HCC)    Colon polyp 09/2010   adenoma   COPD (chronic obstructive pulmonary disease) (HCC) 2009   spirometry with "moderate COPD"   Depression    Diabetes mellitus without complication (HCC)    Dyspnea    GERD (gastroesophageal reflux disease)    GI bleed    after polypectomy/admitted   Hair loss 08/07/2018   HTN (hypertension)    Hypercholesterolemia    Hypomagnesemia 09/15/2022   Memory loss    Myocardial infarction (HCC)    Obesity    Pneumonia 09/15/2022   Sleep apnea    Spontaneous pneumothorax    Synovial cyst of right popliteal space 07/20/2018   Vertigo    Past Surgical History:  Procedure Laterality Date   APPENDECTOMY     CORONARY ANGIOPLASTY WITH STENT PLACEMENT  2007   LAD   IR ANGIOGRAM PELVIS SELECTIVE OR SUPRASELECTIVE  12/24/2023   IR ANGIOGRAM SELECTIVE EACH ADDITIONAL VESSEL  12/24/2023   IR ANGIOGRAM SELECTIVE EACH ADDITIONAL VESSEL  12/24/2023   IR EMBO ARTERIAL NOT HEMORR HEMANG INC GUIDE ROADMAPPING  12/24/2023   IR RADIOLOGIST EVAL & MGMT  11/24/2023   IR US GUIDE VASC ACCESS LEFT   12/24/2023   IR US GUIDE VASC ACCESS RIGHT  12/24/2023   PLEURAL SCARIFICATION  1987   TONSILLECTOMY     VARICOCELECTOMY     Family History  Problem Relation Age of Onset   Heart disease Mother    CVA  Mother    Dementia Mother    Coronary artery disease Father 74   Heart disease Father    Stroke Father    Dementia Sister    Cancer - Other Brother    Hypertension Other    Hyperlipidemia Other    Colon polyps Sister    Cancer Sister    Allergies as of 02/24/2024       Reactions   Altace [ramipril] Cough   Latex Hives   Losartan Cough   cough   Remeron [mirtazapine] Other (See Comments)   Cognitive changes   Tape    Blisters   Zantac [ranitidine Hcl] Dermatitis   Codeine    Makes sick        Medication List        Accurate as of February 24, 2024  4:12 PM. If you have any questions, ask your nurse or doctor.          STOP taking these medications    magnesium chloride 64 MG Tbec SR tablet Commonly known as: SLOW-MAG Stopped by: Felix Pacini       TAKE these medications    Accu-Chek Guide test strip Generic drug: glucose blood 1 each by Other route daily.   amLODipine 5 MG tablet Commonly known as: NORVASC Take 1 tablet (5 mg total) by mouth daily.   atorvastatin 80 MG tablet Commonly known as: LIPITOR Take 1 tablet (80 mg total) by mouth daily.   blood glucose meter kit and supplies Dispense based on patient and insurance preference. Use up to three times daily as directed. (FOR ICD-10 E10.9, E11.9).   buPROPion 300 MG 24 hr tablet Commonly known as: WELLBUTRIN XL Take 1 tablet (300 mg total) by mouth daily.   clopidogrel 75 MG tablet Commonly known as: PLAVIX TAKE 1 TABLET BY MOUTH EVERY DAY   dapagliflozin propanediol 10 MG Tabs tablet Commonly known as: Farxiga Take 1 tablet (10 mg total) by mouth daily before breakfast.   famotidine 20 MG tablet Commonly known as: Pepcid Take 1 tablet (20 mg total) by mouth 2 (two) times daily.    finasteride 5 MG tablet Commonly known as: PROSCAR Take 5 mg by mouth daily.   glipiZIDE 10 MG tablet Commonly known as: GLUCOTROL 1 tab with dinner   Mag64 64 MG Tbec Generic drug: Magnesium Chloride TAKE 1 TABLET (64 MG TOTAL) BY MOUTH 2 (TWO) TIMES DAILY.   metoprolol tartrate 50 MG tablet Commonly known as: LOPRESSOR Take 1 tablet (50 mg total) by mouth daily.   mirabegron ER 50 MG Tb24 tablet Commonly known as: Myrbetriq Take 1 tablet (50 mg total) by mouth daily.   Mounjaro 5 MG/0.5ML Pen Generic drug: tirzepatide Inject 5 mg into the skin once a week.   nitroGLYCERIN 0.4 MG SL tablet Commonly known as: NITROSTAT Place 1 tablet (0.4 mg total) under the tongue every 5 (five) minutes as needed for chest pain (3 doses max).   pantoprazole 40 MG tablet Commonly known as: PROTONIX Take 1 tablet (40 mg total) by mouth 2 (two) times daily.   PARoxetine 40 MG tablet Commonly known as: PAXIL Take 1 tablet (40 mg total) by mouth daily.   Symbicort 160-4.5 MCG/ACT inhaler Generic drug: budesonide-formoterol Inhale 2 puffs into the lungs 2 (two) times daily.   tamsulosin 0.4 MG Caps capsule Commonly known as: FLOMAX   trazodone 300 MG tablet Commonly known as: DESYREL Take 1 tablet (300 mg total) by mouth at bedtime.   VITAMIN B 12  PO Take by mouth.        All past medical history, surgical history, allergies, family history, immunizations andmedications were updated in the EMR today and reviewed under the history and medication portions of their EMR.     ROS Negative, with the exception of above mentioned in HPI   Objective:  BP 120/77   Pulse 76   Temp 98.2 F (36.8 C)   Wt 205 lb 9.6 oz (93.3 kg)   SpO2 98%   BMI 28.68 kg/m  Body mass index is 28.68 kg/m. Physical Exam Vitals and nursing note reviewed.  Constitutional:      General: He is not in acute distress.    Appearance: Normal appearance. He is not ill-appearing, toxic-appearing or  diaphoretic.  HENT:     Head: Normocephalic and atraumatic.  Eyes:     General: No scleral icterus.       Right eye: No discharge.        Left eye: No discharge.     Extraocular Movements: Extraocular movements intact.     Pupils: Pupils are equal, round, and reactive to light.  Cardiovascular:     Rate and Rhythm: Normal rate and regular rhythm.  Pulmonary:     Effort: Pulmonary effort is normal. No respiratory distress.     Breath sounds: Normal breath sounds. No wheezing, rhonchi or rales.  Musculoskeletal:     Right lower leg: No edema.     Left lower leg: No edema.  Skin:    General: Skin is warm.     Findings: No rash.  Neurological:     Mental Status: He is alert. He is disoriented.     Cranial Nerves: No dysarthria or facial asymmetry.     Sensory: Sensation is intact.     Motor: No tremor or seizure activity.     Coordination: Coordination abnormal.     Gait: Gait is intact. Gait normal.  Psychiatric:        Attention and Perception: Attention normal.        Mood and Affect: Mood and affect normal.        Speech: Speech is delayed.        Behavior: Behavior is slowed. Behavior is not agitated, aggressive, withdrawn, hyperactive or combative. Behavior is cooperative.        Thought Content: Thought content normal. Thought content is not paranoid or delusional. Thought content does not include homicidal or suicidal ideation. Thought content does not include homicidal or suicidal plan.        Cognition and Memory: Cognition is impaired. Memory is impaired.        Judgment: Judgment normal.     No results found. No results found. Results for orders placed or performed in visit on 02/24/24 (from the past 24 hours)  Comp Met (CMET)     Status: Abnormal   Collection Time: 02/24/24 11:58 AM  Result Value Ref Range   Glucose, Bld 174 (H) 65 - 99 mg/dL   BUN 17 7 - 25 mg/dL   Creat 1.61 (H) 0.96 - 1.28 mg/dL   eGFR 52 (L) > OR = 60 mL/min/1.59m2   BUN/Creatinine Ratio  12 6 - 22 (calc)   Sodium 139 135 - 146 mmol/L   Potassium 4.4 3.5 - 5.3 mmol/L   Chloride 101 98 - 110 mmol/L   CO2 29 20 - 32 mmol/L   Calcium 9.7 8.6 - 10.3 mg/dL   Total Protein 6.8 6.1 - 8.1 g/dL  Albumin 4.5 3.6 - 5.1 g/dL   Globulin 2.3 1.9 - 3.7 g/dL (calc)   AG Ratio 2.0 1.0 - 2.5 (calc)   Total Bilirubin 0.5 0.2 - 1.2 mg/dL   Alkaline phosphatase (APISO) 70 35 - 144 U/L   AST 27 10 - 35 U/L   ALT 43 9 - 46 U/L  Ammonia     Status: None   Collection Time: 02/24/24 11:58 AM  Result Value Ref Range   Ammonia 29 < OR = 72 umol/L  CBC w/Diff     Status: Abnormal   Collection Time: 02/24/24 11:58 AM  Result Value Ref Range   WBC 8.5 3.8 - 10.8 Thousand/uL   RBC 4.62 4.20 - 5.80 Million/uL   Hemoglobin 12.7 (L) 13.2 - 17.1 g/dL   HCT 60.4 54.0 - 98.1 %   MCV 83.5 80.0 - 100.0 fL   MCH 27.5 27.0 - 33.0 pg   MCHC 32.9 32.0 - 36.0 g/dL   RDW 19.1 (H) 47.8 - 29.5 %   Platelets 204 140 - 400 Thousand/uL   MPV 10.3 7.5 - 12.5 fL   Neutro Abs 5,542 1,500 - 7,800 cells/uL   Absolute Lymphocytes 1,998 850 - 3,900 cells/uL   Absolute Monocytes 646 200 - 950 cells/uL   Eosinophils Absolute 272 15 - 500 cells/uL   Basophils Absolute 43 0 - 200 cells/uL   Neutrophils Relative % 65.2 %   Total Lymphocyte 23.5 %   Monocytes Relative 7.6 %   Eosinophils Relative 3.2 %   Basophils Relative 0.5 %  Vitamin D (25 hydroxy)     Status: None   Collection Time: 02/24/24 11:58 AM  Result Value Ref Range   Vit D, 25-Hydroxy 55 30 - 100 ng/mL  B12 and Folate Panel     Status: Abnormal   Collection Time: 02/24/24 11:58 AM  Result Value Ref Range   Vitamin B-12 1,106 (H) 200 - 1,100 pg/mL   Folate 23.2 ng/mL  TSH     Status: None   Collection Time: 02/24/24 11:58 AM  Result Value Ref Range   TSH 2.78 0.40 - 4.50 mIU/L  Iron, TIBC and Ferritin Panel     Status: Abnormal   Collection Time: 02/24/24 11:58 AM  Result Value Ref Range   Iron 59 50 - 180 mcg/dL   TIBC 621 308 - 657 mcg/dL  (calc)   %SAT 16 (L) 20 - 48 % (calc)   Ferritin 8 (L) 24 - 380 ng/mL  POCT Urinalysis Dipstick (Automated)     Status: Abnormal   Collection Time: 02/24/24 12:12 PM  Result Value Ref Range   Color, UA yellow    Clarity, UA clear    Glucose, UA Positive (A) Negative   Bilirubin, UA negative    Ketones, UA negative    Spec Grav, UA 1.015 1.010 - 1.025   Blood, UA none    pH, UA 6.0 5.0 - 8.0   Protein, UA Negative Negative   Urobilinogen, UA negative (A) 0.2 or 1.0 E.U./dL   Nitrite, UA negative    Leukocytes, UA Negative Negative    Assessment/Plan: [name] is a 78 y.o. male present for OV for  Cerebral atrophy (HCC)/Cerebral vascular disease/Subcortical microvascular ischemic occlusive disease Moderate cognitive impairment with mood/paranoia features and hallucinations Hallucinations (Primary) -Patient seems to have worsening cognitive decline with more dementia and paranoid features over the last 4-6 months.  Family is with him today and reports he is frequently noting seeing people  who are not present.  These hallucinations do come with significant details, ruling out misinterpretation, even though patient is attempting to rationalize and justify his hallucinations today. -His family states that he is also become verbally angry and paranoid with his wife.  Accusing her of hiding things, moving things excetra and keeping them from him.  Calling her names and yelling at her.  He is not verbally attacking his children, and he is appropriate today in the office with this provider and his wife.   -Wife will need caregiver support, and resources for support group excetra.     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 - Start with laboratory workup surrounding cognitive decline and ensure liver/kidney function and med doses are still appropriate. - Will order MRI brain with and without contrast if kidney function allows,  otherwise would be to order MRI without. -Referral back to his neurology team for further evaluation on cognitive decline and dementia (family history of dementia in his mother) - Comp Met (CMET) - Lactic acid, plasma - Ammonia> consider lactulose prescription, he does not have routine bowel movement he may received benefit with bowel habits as well as decrease in ammonia levels if appropriate. - CBC w/Diff - Vitamin D (25 hydroxy)-patient reports he is supplementing - B12 and Folate Panel-patient reports he is supplementing - Vitamin B1 - TSH - Iron, TIBC and Ferritin Panel - Urinalysis w microscopic + reflex cultur - POCT Urinalysis Dipstick (Automated)   Reviewed expectations re: course of current medical issues. Discussed self-management of symptoms. Outlined signs and symptoms indicating need for more acute intervention. Patient verbalized understanding and all questions were answered. Patient received an After-Visit Summary.    Orders Placed This Encounter  Procedures   MR BRAIN W WO CONTRAST   Comp Met (CMET)   Lactic acid, plasma   Ammonia   CBC w/Diff   Vitamin D (25 hydroxy)   B12 and Folate Panel   Vitamin B1   TSH   Iron, TIBC and Ferritin Panel   Urinalysis w microscopic + reflex cultur   Ambulatory referral to Neurology   POCT Urinalysis Dipstick (Automated)   No orders of the defined types were placed in this encounter.  Referral Orders         Ambulatory referral to Neurology      62 minutes spent on the day of service and patient's encounter ordering stat labs, image, discussing worsening cognitive decline with patient's family and patient, placing appropriate referrals.   Note is dictated utilizing voice recognition software. Although note has been proof read prior to signing, occasional typographical errors still can be missed. If any questions arise, please do not hesitate to call for verification.   electronically signed by:  Felix Pacini, DO   [facility] - [place]

## 2024-02-25 ENCOUNTER — Telehealth: Payer: Self-pay

## 2024-02-25 ENCOUNTER — Encounter: Payer: Self-pay | Admitting: Family Medicine

## 2024-02-25 LAB — URINALYSIS W MICROSCOPIC + REFLEX CULTURE
Bacteria, UA: NONE SEEN /HPF
Bilirubin Urine: NEGATIVE
Hgb urine dipstick: NEGATIVE
Hyaline Cast: NONE SEEN /LPF
Ketones, ur: NEGATIVE
Leukocyte Esterase: NEGATIVE
Nitrites, Initial: NEGATIVE
Protein, ur: NEGATIVE
RBC / HPF: NONE SEEN /HPF (ref 0–2)
Specific Gravity, Urine: 1.029 (ref 1.001–1.035)
Squamous Epithelial / HPF: NONE SEEN /HPF (ref <=5)
WBC, UA: NONE SEEN /HPF (ref 0–5)
pH: <=5 (ref 5.0–8.0)

## 2024-02-25 LAB — NO CULTURE INDICATED

## 2024-02-25 NOTE — Telephone Encounter (Signed)
 Reason for CRM: [facility] called to request a prior authorization for  MRI.  They can not schedule the pt without it. Please call [phone]    Please advise.

## 2024-02-26 ENCOUNTER — Encounter: Payer: Self-pay | Admitting: Neurology

## 2024-02-26 ENCOUNTER — Ambulatory Visit (INDEPENDENT_AMBULATORY_CARE_PROVIDER_SITE_OTHER): Admitting: Neurology

## 2024-02-26 VITALS — BP 131/67 | HR 77 | Wt 205.0 lb

## 2024-02-26 DIAGNOSIS — F0393 Unspecified dementia, unspecified severity, with mood disturbance: Secondary | ICD-10-CM | POA: Diagnosis not present

## 2024-02-26 DIAGNOSIS — E1142 Type 2 diabetes mellitus with diabetic polyneuropathy: Secondary | ICD-10-CM | POA: Diagnosis not present

## 2024-02-26 DIAGNOSIS — F330 Major depressive disorder, recurrent, mild: Secondary | ICD-10-CM | POA: Diagnosis not present

## 2024-02-26 DIAGNOSIS — Z7984 Long term (current) use of oral hypoglycemic drugs: Secondary | ICD-10-CM

## 2024-02-26 NOTE — Progress Notes (Signed)
 Chief Complaint  Patient presents with   New Patient (Initial Visit)    Pt in 15, here with daughter Deeann Cree and wife Trish       ASSESSMENT AND PLAN  [name] is a 78 y.o. male   Dementia Diabetic peripheral neuropathy Depression anxiety chronic insomnia  Mini-Mental Status Examination 22/30  Family history of dementia, mother has it in her 49s  MRI of the brain is pending  Differentiation diagnosis including central nervous system degenerative disorder, may have vascular component, he has multiple vascular risk factors, chronic artery disease smoking hypertension hyperlipidemia, diabetes,  Emphasized importance of compliance with his medications, moderate exercise, increase water intake,  If he continues to have significant agitation, difficulty sleeping, may consider Seroquel at nighttime,  Will follow-up with MRI brain result,    DIAGNOSTIC DATA (LABS, IMAGING, TESTING) - I reviewed patient records, labs, notes, testing and imaging myself where available.   MEDICAL HISTORY:  [name], 78 year old male accompanied by his wife and daughter at clinic visit for memory loss, delusional idea, seen in request by his primary care doctor Natalia Leatherwood, initial evaluation was on February 26, 2024    History is obtained from the patient and review of electronic medical records. I personally reviewed pertinent available imaging films in PACS.   PMHx of  DM HTN HLD CAD COPD Quit Smoking in 2000  He is a retired Psychologist, occupational, lives at home with his wife, who is the main caregiver of him and her mother,  Patient had gradual onset of memory loss over the past couple years, misplace things, often get frustrated easily, much less active now, tends to stay at home, irregular sleep pattern, difficulty sleeping at night, taking frequent naps during the day,  He has a long history of depression anxiety, on polypharmacy treatment, including trazodone up to 300 mg at bedtime,  Wellbutrin 300 mg daily, Paxil 40 mg daily, despite that, he remained irritable, difficulty falling to sleep at nighttime  He will often have paranoid ideas, thinking people taking his tools, intruding his garage   His mother suffered dementia in her 72s,  Labs Ferritin 8, normal TSH, Vit B1, B12, Vit D, Hg 12.7, Creat 1.41,   MRI of the brain is pending end of April  PHYSICAL EXAM:   Vitals:   02/26/24 1334  BP: 131/67  Pulse: 77  Weight: 205 lb (93 kg)     Body mass index is 28.59 kg/m.  PHYSICAL EXAMNIATION:  Gen: NAD, conversant, well nourised, well groomed                     Cardiovascular: Regular rate rhythm, no peripheral edema, warm, nontender. Eyes: Conjunctivae clear without exudates or hemorrhage Neck: Supple, no carotid bruits. Pulmonary: Clear to auscultation bilaterally   NEUROLOGICAL EXAM:  MENTAL STATUS: Speech/cognition: Awake, alert, oriented to history taking and casual conversation    02/26/2024    1:43 PM 08/16/2020   11:19 AM 06/29/2019   10:12 AM  MMSE - Mini Mental State Exam  Orientation to time 3 5 5   Orientation to Place 3 5 5   Registration 3 3 3   Attention/ Calculation 4 5 5   Recall 1 2 2   Language- name 2 objects 2 2 2   Language- repeat 1 1 1   Language- follow 3 step command 3 3 3   Language- read & follow direction 1 1 1   Write a sentence 1 1 1   Copy design 0 1 1  Total score 22 29 29     CRANIAL NERVES: CN II: Visual fields are full to confrontation. Pupils are round equal and briskly reactive to light. CN III, IV, VI: extraocular movement are normal. No ptosis. CN V: Facial sensation is intact to light touch CN VII: Face is symmetric with normal eye closure  CN VIII: Hearing is normal to causal conversation. CN IX, X: Phonation is normal. CN XI: Head turning and shoulder shrug are intact  MOTOR: There is no pronator drift of out-stretched arms. Muscle bulk and tone are normal. Muscle strength is normal.  REFLEXES: Reflexes  are 2+ and symmetric at the biceps, triceps, knees, and ankles. Plantar responses are flexor.  SENSORY: Length-dependent sensory changes to midshin level  COORDINATION: There is no trunk or limb dysmetria noted.  GAIT/STANCE: Posture is normal. Gait is steady    REVIEW OF SYSTEMS:  Full 14 system review of systems performed and notable only for as above All other review of systems were negative.   ALLERGIES: Allergies  Allergen Reactions   Altace [Ramipril] Cough   Latex Hives   Losartan Cough    cough   Remeron [Mirtazapine] Other (See Comments)    Cognitive changes   Tape     Blisters    Zantac [Ranitidine Hcl] Dermatitis   Codeine     Makes sick    HOME MEDICATIONS: Current Outpatient Medications  Medication Sig Dispense Refill   amLODipine (NORVASC) 5 MG tablet Take 1 tablet (5 mg total) by mouth daily. 90 tablet 1   atorvastatin (LIPITOR) 80 MG tablet Take 1 tablet (80 mg total) by mouth daily. 90 tablet 3   blood glucose meter kit and supplies Dispense based on patient and insurance preference. Use up to three times daily as directed. (FOR ICD-10 E10.9, E11.9). 1 each 0   buPROPion (WELLBUTRIN XL) 300 MG 24 hr tablet Take 1 tablet (300 mg total) by mouth daily. 90 tablet 1   clopidogrel (PLAVIX) 75 MG tablet TAKE 1 TABLET BY MOUTH EVERY DAY 90 tablet 3   Cyanocobalamin (VITAMIN B 12 PO) Take by mouth.     dapagliflozin propanediol (FARXIGA) 10 MG TABS tablet Take 1 tablet (10 mg total) by mouth daily before breakfast. 90 tablet 2   famotidine (PEPCID) 20 MG tablet Take 1 tablet (20 mg total) by mouth 2 (two) times daily. 180 tablet 3   finasteride (PROSCAR) 5 MG tablet Take 5 mg by mouth daily.     glipiZIDE (GLUCOTROL) 10 MG tablet 1 tab with dinner 90 tablet 1   glucose blood (ACCU-CHEK GUIDE) test strip 1 each by Other route daily. 100 strip 3   MAG64 64 MG TBEC TAKE 1 TABLET (64 MG TOTAL) BY MOUTH 2 (TWO) TIMES DAILY. 180 tablet 1   metoprolol tartrate  (LOPRESSOR) 50 MG tablet Take 1 tablet (50 mg total) by mouth daily. 90 tablet 1   mirabegron ER (MYRBETRIQ) 50 MG TB24 tablet Take 1 tablet (50 mg total) by mouth daily. 30 tablet 0   MOUNJARO 5 MG/0.5ML Pen Inject 5 mg into the skin once a week. 2 mL 5   nitroGLYCERIN (NITROSTAT) 0.4 MG SL tablet Place 1 tablet (0.4 mg total) under the tongue every 5 (five) minutes as needed for chest pain (3 doses max). 25 tablet 3   pantoprazole (PROTONIX) 40 MG tablet Take 1 tablet (40 mg total) by mouth 2 (two) times daily. 180 tablet 3   PARoxetine (PAXIL) 40 MG tablet Take 1 tablet (40  mg total) by mouth daily. 90 tablet 1   SYMBICORT 160-4.5 MCG/ACT inhaler Inhale 2 puffs into the lungs 2 (two) times daily. 3 each 4   tamsulosin (FLOMAX) 0.4 MG CAPS capsule      trazodone (DESYREL) 300 MG tablet Take 1 tablet (300 mg total) by mouth at bedtime. 90 tablet 1   No current facility-administered medications for this visit.    PAST MEDICAL HISTORY: Past Medical History:  Diagnosis Date   Achilles tendinitis of left lower extremity 07/20/2018   Adenomatous colon polyp 2011   Barrett esophagus 12/2013   EGD   CAD (coronary artery disease)    ETT/Lexiscan-Myoview (11/14):  Normal; no ischemia, EF 67%   CKD (chronic kidney disease), stage III (HCC)    Colon polyp 09/2010   adenoma   COPD (chronic obstructive pulmonary disease) (HCC) 2009   spirometry with "moderate COPD"   Depression    Diabetes mellitus without complication (HCC)    Dyspnea    GERD (gastroesophageal reflux disease)    GI bleed    after polypectomy/admitted   Hair loss 08/07/2018   HTN (hypertension)    Hypercholesterolemia    Hypomagnesemia 09/15/2022   Memory loss    Myocardial infarction (HCC)    Obesity    Pneumonia 09/15/2022   Sleep apnea    Spontaneous pneumothorax    Synovial cyst of right popliteal space 07/20/2018   Vertigo     PAST SURGICAL HISTORY: Past Surgical History:  Procedure Laterality Date    APPENDECTOMY     CORONARY ANGIOPLASTY WITH STENT PLACEMENT  2007   LAD   IR ANGIOGRAM PELVIS SELECTIVE OR SUPRASELECTIVE  12/24/2023   IR ANGIOGRAM SELECTIVE EACH ADDITIONAL VESSEL  12/24/2023   IR ANGIOGRAM SELECTIVE EACH ADDITIONAL VESSEL  12/24/2023   IR EMBO ARTERIAL NOT HEMORR HEMANG INC GUIDE ROADMAPPING  12/24/2023   IR RADIOLOGIST EVAL & MGMT  11/24/2023   IR US GUIDE VASC ACCESS LEFT  12/24/2023   IR US GUIDE VASC ACCESS RIGHT  12/24/2023   PLEURAL SCARIFICATION  1987   TONSILLECTOMY     VARICOCELECTOMY      FAMILY HISTORY: Family History  Problem Relation Age of Onset   Heart disease Mother    CVA Mother    Dementia Mother    Coronary artery disease Father 6   Heart disease Father    Stroke Father    Dementia Sister    Cancer - Other Brother    Hypertension Other    Hyperlipidemia Other    Colon polyps Sister    Cancer Sister     SOCIAL HISTORY: Social History   Socioeconomic History   Marital status: Married    Spouse name: Not on file   Number of children: 2   Years of education: come college   Highest education level: GED or equivalent  Occupational History   Occupation: retired Chartered certified accountant  Tobacco Use   Smoking status: Former    Current packs/day: 0.00    Types: Cigarettes    Quit date: 11/25/1998    Years since quitting: 25.2   Smokeless tobacco: Never  Vaping Use   Vaping status: Never Used  Substance and Sexual Activity   Alcohol use: No   Drug use: No   Sexual activity: Not Currently  Other Topics Concern   Not on file  Social History Narrative   Lives at home with wife. Married to [name].   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    Drinks caffeine (3 cups coffee, 2  sodas per day),  Takes a daily vitamin, wears a seatbelt   Wears a bicycle helmet.    Has dentures (partial on top)   Smoke detector in the home. No firearms in the home.    Feels safe in relationships.     Right-handed.   Social Drivers of Corporate investment banker Strain: Low Risk   (12/19/2023)   Overall Financial Resource Strain (CARDIA)    Difficulty of Paying Living Expenses: Not very hard  Food Insecurity: No Food Insecurity (12/19/2023)   Hunger Vital Sign    Worried About Running Out of Food in the Last Year: Never true    Ran Out of Food in the Last Year: Never true  Transportation Needs: No Transportation Needs (12/19/2023)   PRAPARE - Administrator, Civil Service (Medical): No    Lack of Transportation (Non-Medical): No  Physical Activity: Unknown (12/19/2023)   Exercise Vital Sign    Days of Exercise per Week: 0 days    Minutes of Exercise per Session: Not on file  Stress: Stress Concern Present (12/19/2023)   [employer] of Occupational Health - Occupational Stress Questionnaire    Feeling of Stress : To some extent  Social Connections: Unknown (12/19/2023)   Social Connection and Isolation Panel [NHANES]    Frequency of Communication with Friends and Family: More than three times a week    Frequency of Social Gatherings with Friends and Family: Patient declined    Attends Religious Services: Patient declined    Database administrator or Organizations: No    Attends Engineer, structural: Not on file    Marital Status: Married  Catering manager Violence: Not At Risk (12/04/2022)   Humiliation, Afraid, Rape, and Kick questionnaire    Fear of Current or Ex-Partner: No    Emotionally Abused: No    Physically Abused: No    Sexually Abused: No      Levert Feinstein, M.D. Ph.D.  [facility] [address], [address] [place], [place] [address] Ph: [phone] Fax: [phone]  CC:  Natalia Leatherwood, DO [address]  [place] [address]  Claiborne Billings, Renee A, DO

## 2024-02-26 NOTE — Telephone Encounter (Signed)
 Epic shows appt was scheduled for 4/22. Auth will be done before the appt

## 2024-02-27 ENCOUNTER — Telehealth: Payer: Self-pay | Admitting: Family Medicine

## 2024-02-27 DIAGNOSIS — H02423 Myogenic ptosis of bilateral eyelids: Secondary | ICD-10-CM | POA: Diagnosis not present

## 2024-02-27 DIAGNOSIS — D485 Neoplasm of uncertain behavior of skin: Secondary | ICD-10-CM | POA: Diagnosis not present

## 2024-02-27 MED ORDER — POLYSACCHARIDE IRON COMPLEX 434.8 (200 FE) MG PO CAPS: 1.0000 | ORAL_CAPSULE | Freq: Every day | ORAL | 5 refills | Status: DC

## 2024-02-27 MED ORDER — LUBIPROSTONE 8 MCG PO CAPS: 8.0000 ug | ORAL_CAPSULE | Freq: Two times a day (BID) | ORAL | 5 refills | Status: DC

## 2024-02-27 MED ORDER — VITAMIN B-1 50 MG PO TABS: 50.0000 mg | ORAL_TABLET | Freq: Every day | ORAL | 3 refills | Status: DC

## 2024-02-27 MED ORDER — BUPROPION HCL ER (SR) 150 MG PO TB12: 150.0000 mg | ORAL_TABLET | Freq: Every day | ORAL | 1 refills | Status: DC

## 2024-02-27 MED ORDER — QUETIAPINE FUMARATE 100 MG PO TABS: ORAL_TABLET | ORAL | 1 refills | Status: DC

## 2024-02-27 NOTE — Telephone Encounter (Signed)
 Please call patient's wife - His iron levels are decreasing again.  I have called in the iron polysaccharide supplement for him to start. -Since he has trouble with regular bowel movements, and we are aiming to restart the iron, I called in a medication called Amitiza that is taken twice a day, every day to relieve constipation.  -I called in a another vitamin called B1 or thiamine.  This is a once a day tab if insurance does not cover it it is over-the-counter.  This helps with memory  -For his mood: -Stop trazodone.   Start Seroquel 1/2 tab in the morning 1 tab before bed. -Stop Wellbutrin 300 mg XL; start short acting Wellbutrin 150 mg in the morning -Continue other meds as is for now  We will call him with the MRI results  In the meantime, I would recommend that he make an appointment in 2 weeks for follow-up of the changes above.  It may be difficult in the transition of these medications, but I encourage them to try to stick with it as we find the right dose and meds to taper up on.

## 2024-02-27 NOTE — Telephone Encounter (Signed)
LVM for pts wife.  

## 2024-02-29 LAB — CBC WITH DIFFERENTIAL/PLATELET
Absolute Lymphocytes: 1998 {cells}/uL (ref 850–3900)
Absolute Monocytes: 646 {cells}/uL (ref 200–950)
Basophils Absolute: 43 {cells}/uL (ref 0–200)
Basophils Relative: 0.5 %
Eosinophils Absolute: 272 {cells}/uL (ref 15–500)
Eosinophils Relative: 3.2 %
HCT: 38.6 % (ref 38.5–50.0)
Hemoglobin: 12.7 g/dL — ABNORMAL LOW (ref 13.2–17.1)
MCH: 27.5 pg (ref 27.0–33.0)
MCHC: 32.9 g/dL (ref 32.0–36.0)
MCV: 83.5 fL (ref 80.0–100.0)
MPV: 10.3 fL (ref 7.5–12.5)
Monocytes Relative: 7.6 %
Neutro Abs: 5542 {cells}/uL (ref 1500–7800)
Neutrophils Relative %: 65.2 %
Platelets: 204 10*3/uL (ref 140–400)
RBC: 4.62 10*6/uL (ref 4.20–5.80)
RDW: 15.2 % — ABNORMAL HIGH (ref 11.0–15.0)
Total Lymphocyte: 23.5 %
WBC: 8.5 10*3/uL (ref 3.8–10.8)

## 2024-02-29 LAB — IRON,TIBC AND FERRITIN PANEL
%SAT: 16 % — ABNORMAL LOW (ref 20–48)
Ferritin: 8 ng/mL — ABNORMAL LOW (ref 24–380)
Iron: 59 ug/dL (ref 50–180)
TIBC: 365 ug/dL (ref 250–425)

## 2024-02-29 LAB — COMPREHENSIVE METABOLIC PANEL WITH GFR
AG Ratio: 2 (calc) (ref 1.0–2.5)
ALT: 43 U/L (ref 9–46)
AST: 27 U/L (ref 10–35)
Albumin: 4.5 g/dL (ref 3.6–5.1)
Alkaline phosphatase (APISO): 70 U/L (ref 35–144)
BUN/Creatinine Ratio: 12 (calc) (ref 6–22)
BUN: 17 mg/dL (ref 7–25)
CO2: 29 mmol/L (ref 20–32)
Calcium: 9.7 mg/dL (ref 8.6–10.3)
Chloride: 101 mmol/L (ref 98–110)
Creat: 1.41 mg/dL — ABNORMAL HIGH (ref 0.70–1.28)
Globulin: 2.3 g/dL (ref 1.9–3.7)
Glucose, Bld: 174 mg/dL — ABNORMAL HIGH (ref 65–99)
Potassium: 4.4 mmol/L (ref 3.5–5.3)
Sodium: 139 mmol/L (ref 135–146)
Total Bilirubin: 0.5 mg/dL (ref 0.2–1.2)
Total Protein: 6.8 g/dL (ref 6.1–8.1)
eGFR: 52 mL/min/{1.73_m2} — ABNORMAL LOW (ref >=60)

## 2024-02-29 LAB — TSH: TSH: 2.78 m[IU]/L (ref 0.40–4.50)

## 2024-02-29 LAB — B12 AND FOLATE PANEL
Folate: 23.2 ng/mL
Vitamin B-12: 1106 pg/mL — ABNORMAL HIGH (ref 200–1100)

## 2024-02-29 LAB — VITAMIN B1: Vitamin B1 (Thiamine): 70 nmol/L — ABNORMAL HIGH (ref 8–30)

## 2024-02-29 LAB — LACTIC ACID, PLASMA: LACTIC ACID: 1.5 mmol/L (ref 0.4–1.8)

## 2024-02-29 LAB — AMMONIA: Ammonia: 29 umol/L (ref <=72)

## 2024-02-29 LAB — VITAMIN D 25 HYDROXY (VIT D DEFICIENCY, FRACTURES): Vit D, 25-Hydroxy: 55 ng/mL (ref 30–100)

## 2024-03-01 ENCOUNTER — Encounter: Payer: Self-pay | Admitting: Family Medicine

## 2024-03-01 NOTE — Telephone Encounter (Signed)
 MyChart message read.

## 2024-03-16 ENCOUNTER — Ambulatory Visit
Admission: RE | Admit: 2024-03-16 | Discharge: 2024-03-16 | Disposition: A | Discharge status: Home / Self Care | Source: Ambulatory Visit | Attending: Family Medicine | Admitting: Family Medicine

## 2024-03-16 ENCOUNTER — Telehealth: Payer: Self-pay | Admitting: Family Medicine

## 2024-03-16 ENCOUNTER — Encounter: Payer: Self-pay | Admitting: Hematology

## 2024-03-16 DIAGNOSIS — R4182 Altered mental status, unspecified: Secondary | ICD-10-CM | POA: Diagnosis not present

## 2024-03-16 DIAGNOSIS — I679 Cerebrovascular disease, unspecified: Secondary | ICD-10-CM

## 2024-03-16 DIAGNOSIS — R4189 Other symptoms and signs involving cognitive functions and awareness: Secondary | ICD-10-CM

## 2024-03-16 DIAGNOSIS — G319 Degenerative disease of nervous system, unspecified: Secondary | ICD-10-CM

## 2024-03-16 DIAGNOSIS — R443 Hallucinations, unspecified: Secondary | ICD-10-CM

## 2024-03-16 MED ORDER — GADOPICLENOL 0.5 MMOL/ML IV SOLN
10.0000 mL | Freq: Once | INTRAVENOUS | Status: AC | PRN
Start: 2024-03-16 — End: 2024-03-16
  Administered 2024-03-16: 10 mL via INTRAVENOUS

## 2024-03-16 NOTE — Telephone Encounter (Signed)
 Please call patient's wife: MRI of the brain does not show any acute changes or reversible findings, such as stroke, mass or brain lesion. There are moderate chronic small-vessel ischemic changes of the deep and subcortical white matter, this has worsened since 2021 MRI.    These types of changes can cause impairment in cognitive skills and memory.  And can be consistent with vascular dementia.

## 2024-03-16 NOTE — Telephone Encounter (Signed)
LVM for pts wife.  

## 2024-03-18 DIAGNOSIS — L570 Actinic keratosis: Secondary | ICD-10-CM | POA: Diagnosis not present

## 2024-03-18 DIAGNOSIS — D485 Neoplasm of uncertain behavior of skin: Secondary | ICD-10-CM | POA: Diagnosis not present

## 2024-03-18 DIAGNOSIS — B078 Other viral warts: Secondary | ICD-10-CM | POA: Diagnosis not present

## 2024-03-23 ENCOUNTER — Other Ambulatory Visit: Payer: Self-pay | Admitting: Family Medicine

## 2024-03-24 ENCOUNTER — Ambulatory Visit (INDEPENDENT_AMBULATORY_CARE_PROVIDER_SITE_OTHER): Admitting: Family Medicine

## 2024-03-24 ENCOUNTER — Encounter: Payer: Self-pay | Admitting: Family Medicine

## 2024-03-24 VITALS — BP 116/72 | HR 82 | Temp 98.2°F | Wt 207.4 lb

## 2024-03-24 DIAGNOSIS — F330 Major depressive disorder, recurrent, mild: Secondary | ICD-10-CM

## 2024-03-24 DIAGNOSIS — F03B3 Unspecified dementia, moderate, with mood disturbance: Secondary | ICD-10-CM

## 2024-03-24 DIAGNOSIS — R4189 Other symptoms and signs involving cognitive functions and awareness: Secondary | ICD-10-CM

## 2024-03-24 DIAGNOSIS — Z79899 Other long term (current) drug therapy: Secondary | ICD-10-CM

## 2024-03-24 DIAGNOSIS — D500 Iron deficiency anemia secondary to blood loss (chronic): Secondary | ICD-10-CM | POA: Diagnosis not present

## 2024-03-24 DIAGNOSIS — G319 Degenerative disease of nervous system, unspecified: Secondary | ICD-10-CM

## 2024-03-24 DIAGNOSIS — F5101 Primary insomnia: Secondary | ICD-10-CM

## 2024-03-24 DIAGNOSIS — Z818 Family history of other mental and behavioral disorders: Secondary | ICD-10-CM | POA: Diagnosis not present

## 2024-03-24 DIAGNOSIS — I679 Cerebrovascular disease, unspecified: Secondary | ICD-10-CM | POA: Diagnosis not present

## 2024-03-24 DIAGNOSIS — I252 Old myocardial infarction: Secondary | ICD-10-CM

## 2024-03-24 DIAGNOSIS — I2510 Atherosclerotic heart disease of native coronary artery without angina pectoris: Secondary | ICD-10-CM

## 2024-03-24 DIAGNOSIS — K5901 Slow transit constipation: Secondary | ICD-10-CM

## 2024-03-24 DIAGNOSIS — E785 Hyperlipidemia, unspecified: Secondary | ICD-10-CM | POA: Diagnosis not present

## 2024-03-24 DIAGNOSIS — K2100 Gastro-esophageal reflux disease with esophagitis, without bleeding: Secondary | ICD-10-CM

## 2024-03-24 DIAGNOSIS — E1169 Type 2 diabetes mellitus with other specified complication: Secondary | ICD-10-CM | POA: Diagnosis not present

## 2024-03-24 DIAGNOSIS — D6869 Other thrombophilia: Secondary | ICD-10-CM

## 2024-03-24 DIAGNOSIS — I10 Essential (primary) hypertension: Secondary | ICD-10-CM

## 2024-03-24 DIAGNOSIS — Z5181 Encounter for therapeutic drug level monitoring: Secondary | ICD-10-CM

## 2024-03-24 DIAGNOSIS — I6782 Cerebral ischemia: Secondary | ICD-10-CM

## 2024-03-24 MED ORDER — PAROXETINE HCL 40 MG PO TABS: 40.0000 mg | ORAL_TABLET | Freq: Every day | ORAL | 1 refills | Status: DC

## 2024-03-24 MED ORDER — MIRABEGRON ER 50 MG PO TB24: 50.0000 mg | ORAL_TABLET | Freq: Every day | ORAL | 5 refills | Status: DC

## 2024-03-24 MED ORDER — BUPROPION HCL ER (SR) 150 MG PO TB12: 150.0000 mg | ORAL_TABLET | Freq: Every day | ORAL | 1 refills | Status: DC

## 2024-03-24 MED ORDER — QUETIAPINE FUMARATE 100 MG PO TABS: ORAL_TABLET | ORAL | 1 refills | Status: DC

## 2024-03-24 MED ORDER — DONEPEZIL HCL 5 MG PO TBDP: 5.0000 mg | ORAL_TABLET | Freq: Every day | ORAL | 1 refills | Status: DC

## 2024-03-24 MED ORDER — AMLODIPINE BESYLATE 5 MG PO TABS: 5.0000 mg | ORAL_TABLET | Freq: Every day | ORAL | 1 refills | Status: DC

## 2024-03-24 MED ORDER — ATORVASTATIN CALCIUM 80 MG PO TABS: 80.0000 mg | ORAL_TABLET | Freq: Every day | ORAL | 3 refills | Status: DC

## 2024-03-24 MED ORDER — METOPROLOL TARTRATE 50 MG PO TABS: 50.0000 mg | ORAL_TABLET | Freq: Every day | ORAL | 1 refills | Status: DC

## 2024-03-24 MED ORDER — FAMOTIDINE 20 MG PO TABS: 20.0000 mg | ORAL_TABLET | Freq: Two times a day (BID) | ORAL | 3 refills | Status: DC

## 2024-03-24 NOTE — Patient Instructions (Signed)
 Return in about 12 weeks (around 06/16/2024) for Routine chronic condition follow-up.  Increase seroquel  night dose to 1.5 tabs, still 1/2 tab in day.  Start aricept 1 tab before bed.       Great to see you today.  I have refilled the medication(s) we provide.   If labs were collected or images ordered, we will inform you of  results once we have received them and reviewed. We will contact you either by echart message, or telephone call.  Please give ample time to the testing facility, and our office to run,  receive and review results. Please do not call inquiring of results, even if you can see them in your chart. We will contact you as soon as we are able. If it has been over 1 week since the test was completed, and you have not yet heard from us , then please call us .    - echart message- for normal results that have been seen by the patient already.   - telephone call: abnormal results or if patient has not viewed results in their echart.  If a referral to a specialist was entered for you, please call us  in 2 weeks if you have not heard from the specialist office to schedule.

## 2024-03-24 NOTE — Progress Notes (Signed)
 [name] , [birth year], 78 y.o., male MRN: [id number] Patient Care Team    Relationship Specialty Notifications Start End  Mariel Shope, DO PCP - General Family Medicine  12/25/15   Loyde Rule, MD PCP - Cardiology Cardiology Admissions 08/20/18   Frankie Israel, MD Consulting Physician Hematology  09/03/16   Loyde Rule, MD Consulting Physician Cardiology  09/03/16   Baldo Bonds, MD Consulting Physician Gastroenterology  09/03/16   Hollar, Earla Glassman, MD Referring Physician Dermatology  06/29/19   Ranny Bye, OD Referring Physician   01/31/23   Samson Croak, MD Consulting Physician Urology  02/24/24     Chief Complaint  Patient presents with   Diabetes   Depression    Would like further explanation of MRI     Subjective: [name] is a 78 y.o. Pt presents for an OV with his wife, and they bring their daughter today, to discuss patient's cognitive changes since seeing each other last and making medication changes.  Wife reports he is sleeping better and he seems to have less mood disturbance during the day.  She feels like overall the medication is working well for him. MRI discussed in detail today which showed ischemic changes and atrophy present.  Prior note: His family states that he has been having more frequent visual and auditory hallucinations. Example of the visual hallucinations they give, is that he saw 2 men in his yard while he was out working in the yard with his son.  He reported one of them today wait lab coat on, "like a doctor." Example of the auditory hallucinations given today is he feels he hears people talking in the next room, but there is nobody there.  He states he thinks it is because the TV is loud, his wife reports this occurs even when the TV is not an issue. When the patient leaves the room for labs, patient's wife and daughter state that he has become very paranoid and accuses his wife of misplacing or moving  objects to keep them from him, including keys, money etc.  He is misplacing objects frequently.  He will watch her, and go on their reading security cameras and ask her who she was with stating that he saw more than 1 person in the car excetra.  He has verbally lashed out at her on many occasions, causing her a great deal of sadness and stress.  Patient is a diabetic, prescribed Farxiga , Mounjaro, glipizide .  Per family patient does not eat well, but the symptoms of hallucinations and cognitive changes are not necessarily occurring any particular time of the day, or associated with meals/lack of meals. Patient reports he struggles with routine bowel movements, possibly has a bowel movement every 1 to 2 days, he is not sure.  Initially he states he had his "1 a month.  " Patient does not drink water or hydrate well.  He drinks soda.  Patient had a recent surgery to help with his urinary frequency/BPH.  Surgery was completed December 24, 2023.  He states initially he thought it did help, but now he is back to having urinary frequency.  Patient has a longstanding history of severe depression and anxiety.  Multiple medication regimens have been tried with side effects.  Currently prescribed trazodone  300 mg nightly, with reports of still not sleeping well.  Paxil  40 mg daily  There is a family history of dementia in his mother, he  is uncertain what type or onset.  He also feels some of his siblings had memory changes, but he is not certain. Recurrent major depressive disorder, in partial remission (HCC)/memory changes He reports compliance with trazodone  200 mg nightly, Paxil  40 mg daily and Wellbutrin  300 mg daily. Zyprexa  discontinued secondary to arthralgias. Prior note: Patient is present with his wife today. They report he is feeling better. He has not started the Flintstone vitamin. He is tolerating the magnesium  supplement.He has more energy and less pain.  He stopped the zyprexa  and started the  higher dose trazodone  200 mg QHS. He is still not sleeping great, but ok for him.    Gastroesophageal reflux disease, esophagitis presence not specified/diarrhea/bloating  EGD 03/2016 by Dr. Honey Lusty with signs of barrett's esophagus. Patient was tried on lower dose medication, and was unable to tolerate secondary to return of symptoms.   His symptoms are well-controlled with protonix  40 BID and pepcid  BID.    COPD [facility]) He uses Symbicort  daily and feels it is working well for him.   Atherosclerosis of native coronary artery of native heart without angina pectoris/Essential hypertension/HYPERCHOLESTEROLEMIA/History of MI (myocardial infarction)/IDA He has known CAD with prior LAD stent from 2007. Pt reports compliance with metoprolol  50 mg daily and amlodipine  5 mg daily Qd Patient denies chest pain, shortness of breath, dizziness or lower extremity edema.  Pt takes a daily Plavix . Pt is  prescribed statin and followed by Dr. Stann Earnest Cardiology.  Diet: Low-sodium RF: Hypertension, hyperlipidemia, diabetes  Type 2 diabetes mellitus without complication, without long-term current use of insulin (HCC) patient reports compliance with Farxiga  10 mg daily and glipizide  10 mg BID.  Mounjaro 2.5 mg started last visit and he is tolerating He had diarrhea  off metformin  trial as well.  We discontinued metformin  regardless, to not cause the potential of worsening diarrhea.   Patient denies dizziness, hyperglycemic or hypoglycemic events. Patient denies numbness, tingling in the extremities or nonhealing wounds of feet.        03/24/2024    2:21 PM 12/22/2023    1:54 PM 09/22/2023    2:00 PM 06/09/2023    1:23 PM 02/14/2023    1:35 PM  Depression screen PHQ 2/9  Decreased Interest 2 1 2  0 0  Down, Depressed, Hopeless 1 1 1 1  0  PHQ - 2 Score 3 2 3 1  0  Altered sleeping 2 2 1 1    Tired, decreased energy 2 1 0 0   Change in appetite 1 1 1 1    Feeling bad or failure about yourself  0 0 1 0    Trouble concentrating  1 1 0   Moving slowly or fidgety/restless 0 0 1 0   Suicidal thoughts 0 0 0 0   PHQ-9 Score 8 7 8 3    Difficult doing work/chores  Not difficult at all Not difficult at all      Allergies  Allergen Reactions   Altace [Ramipril] Cough   Latex Hives   Losartan  Cough    cough   Remeron  [Mirtazapine ] Other (See Comments)    Cognitive changes   Tape     Blisters    Zantac [Ranitidine Hcl] Dermatitis   Codeine     Makes sick   Social History   Social History Narrative   Lives at home with wife. Married to [name].   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    Drinks caffeine (3 cups coffee, 2 sodas per day),  Takes a daily vitamin, wears a  seatbelt   Wears a bicycle helmet.    Has dentures (partial on top)   Smoke detector in the home. No firearms in the home.    Feels safe in relationships.     Right-handed.   Past Medical History:  Diagnosis Date   Achilles tendinitis of left lower extremity 07/20/2018   Adenomatous colon polyp 2011   Barrett esophagus 12/2013   EGD   CAD (coronary artery disease)    ETT/Lexiscan -Myoview  (11/14):  Normal; no ischemia, EF 67%   CKD (chronic kidney disease), stage III (HCC)    Colon polyp 09/2010   adenoma   COPD (chronic obstructive pulmonary disease) (HCC) 2009   spirometry with "moderate COPD"   Depression    Diabetes mellitus without complication (HCC)    Dyspnea    GERD (gastroesophageal reflux disease)    GI bleed    after polypectomy/admitted   Hair loss 08/07/2018   HTN (hypertension)    Hypercholesterolemia    Hypomagnesemia 09/15/2022   Memory loss    Myocardial infarction (HCC)    Obesity    Pneumonia 09/15/2022   Sleep apnea    Spontaneous pneumothorax    Synovial cyst of right popliteal space 07/20/2018   Vertigo    Past Surgical History:  Procedure Laterality Date   APPENDECTOMY     CORONARY ANGIOPLASTY WITH STENT PLACEMENT  2007   LAD   IR ANGIOGRAM PELVIS SELECTIVE OR SUPRASELECTIVE  12/24/2023   IR  ANGIOGRAM SELECTIVE EACH ADDITIONAL VESSEL  12/24/2023   IR ANGIOGRAM SELECTIVE EACH ADDITIONAL VESSEL  12/24/2023   IR EMBO ARTERIAL NOT HEMORR HEMANG INC GUIDE ROADMAPPING  12/24/2023   IR RADIOLOGIST EVAL & MGMT  11/24/2023   IR US  GUIDE VASC ACCESS LEFT  12/24/2023   IR US  GUIDE VASC ACCESS RIGHT  12/24/2023   PLEURAL SCARIFICATION  1987   TONSILLECTOMY     VARICOCELECTOMY     Family History  Problem Relation Age of Onset   Heart disease Mother    CVA Mother    Dementia Mother    Coronary artery disease Father 19   Heart disease Father    Stroke Father    Dementia Sister    Cancer - Other Brother    Hypertension Other    Hyperlipidemia Other    Colon polyps Sister    Cancer Sister    Allergies as of 03/24/2024       Reactions   Altace [ramipril] Cough   Latex Hives   Losartan  Cough   cough   Remeron  [mirtazapine ] Other (See Comments)   Cognitive changes   Tape    Blisters   Zantac [ranitidine Hcl] Dermatitis   Codeine    Makes sick        Medication List        Accurate as of March 24, 2024  2:50 PM. If you have any questions, ask your nurse or doctor.          Accu-Chek Guide test strip Generic drug: glucose blood 1 each by Other route daily.   amLODipine  5 MG tablet Commonly known as: NORVASC  Take 1 tablet (5 mg total) by mouth daily.   atorvastatin  80 MG tablet Commonly known as: LIPITOR Take 1 tablet (80 mg total) by mouth daily.   blood glucose meter kit and supplies Dispense based on patient and insurance preference. Use up to three times daily as directed. (FOR ICD-10 E10.9, E11.9).   buPROPion  150 MG 12 hr tablet Commonly known as: WELLBUTRIN   SR Take 1 tablet (150 mg total) by mouth daily.   clopidogrel  75 MG tablet Commonly known as: PLAVIX  TAKE 1 TABLET BY MOUTH EVERY DAY   dapagliflozin  propanediol 10 MG Tabs tablet Commonly known as: Farxiga  Take 1 tablet (10 mg total) by mouth daily before breakfast.   famotidine  20 MG  tablet Commonly known as: Pepcid  Take 1 tablet (20 mg total) by mouth 2 (two) times daily.   finasteride 5 MG tablet Commonly known as: PROSCAR Take 5 mg by mouth daily.   glipiZIDE  10 MG tablet Commonly known as: GLUCOTROL  1 tab with dinner   lubiprostone  8 MCG capsule Commonly known as: AMITIZA  Take 1 capsule (8 mcg total) by mouth 2 (two) times daily with a meal.   Mag64 64 MG Tbec Generic drug: Magnesium  Chloride TAKE 1 TABLET (64 MG TOTAL) BY MOUTH 2 (TWO) TIMES DAILY.   metoprolol  tartrate 50 MG tablet Commonly known as: LOPRESSOR  Take 1 tablet (50 mg total) by mouth daily.   mirabegron  ER 50 MG Tb24 tablet Commonly known as: Myrbetriq  Take 1 tablet (50 mg total) by mouth daily.   Mounjaro 5 MG/0.5ML Pen Generic drug: tirzepatide Inject 5 mg into the skin once a week.   nitroGLYCERIN  0.4 MG SL tablet Commonly known as: NITROSTAT  Place 1 tablet (0.4 mg total) under the tongue every 5 (five) minutes as needed for chest pain (3 doses max).   pantoprazole  40 MG tablet Commonly known as: PROTONIX  Take 1 tablet (40 mg total) by mouth 2 (two) times daily.   PARoxetine  40 MG tablet Commonly known as: PAXIL  Take 1 tablet (40 mg total) by mouth daily.   Polysaccharide Iron  Complex 434.8 (200 Fe) MG Caps Take 1 capsule by mouth daily.   QUEtiapine  100 MG tablet Commonly known as: SEROquel  1/2 tab in the morning, 1 tab before bed   Symbicort  160-4.5 MCG/ACT inhaler Generic drug: budesonide-formoterol Inhale 2 puffs into the lungs 2 (two) times daily.   tamsulosin 0.4 MG Caps capsule Commonly known as: FLOMAX   VITAMIN B 12 PO Take by mouth.        All past medical history, surgical history, allergies, family history, immunizations andmedications were updated in the EMR today and reviewed under the history and medication portions of their EMR.     ROS Negative, with the exception of above mentioned in HPI   Objective:  BP 116/72   Pulse 82   Temp  98.2 F (36.8 C)   Wt 207 lb 6.4 oz (94.1 kg)   SpO2 95%   BMI 28.93 kg/m  Body mass index is 28.93 kg/m. Physical Exam Vitals and nursing note reviewed.  Constitutional:      General: He is not in acute distress.    Appearance: Normal appearance. He is not ill-appearing, toxic-appearing or diaphoretic.  HENT:     Head: Normocephalic and atraumatic.  Eyes:     General: No scleral icterus.       Right eye: No discharge.        Left eye: No discharge.     Extraocular Movements: Extraocular movements intact.     Pupils: Pupils are equal, round, and reactive to light.  Cardiovascular:     Rate and Rhythm: Normal rate and regular rhythm.  Pulmonary:     Effort: Pulmonary effort is normal. No respiratory distress.     Breath sounds: Normal breath sounds. No wheezing, rhonchi or rales.  Musculoskeletal:     Right lower leg: No edema.  Left lower leg: No edema.  Skin:    General: Skin is warm.     Findings: No rash.  Neurological:     Mental Status: He is alert and oriented to person, place, and time. Mental status is at baseline.     Cranial Nerves: No dysarthria or facial asymmetry.     Sensory: Sensation is intact.     Motor: No weakness, tremor or seizure activity.     Coordination: Coordination normal.     Gait: Gait is intact. Gait normal.  Psychiatric:        Attention and Perception: Attention normal.        Mood and Affect: Mood and affect normal.        Speech: Speech normal. Speech is not delayed.        Behavior: Behavior is not agitated, slowed, aggressive, withdrawn, hyperactive or combative. Behavior is cooperative.        Thought Content: Thought content normal. Thought content is not paranoid or delusional. Thought content does not include homicidal or suicidal ideation. Thought content does not include homicidal or suicidal plan.        Cognition and Memory: Cognition is impaired. Memory is impaired.        Judgment: Judgment normal.     No results  found. No results found. No results found for this or any previous visit (from the past 24 hours).   Assessment/Plan: [name] is a 78 y.o. male present for OV for  Cerebral atrophy (HCC)/Cerebral vascular disease/Subcortical microvascular ischemic occlusive disease Moderate cognitive impairment with mood/paranoia features and hallucinations Hallucinations (Primary) Improving mood.  No longer having hallucinations, sleeping better. - Discussed changes appreciated on MRI of the brain. - Has seen neurology recently. - Comp Met (CMET) within normal - Lactic acid, plasma-within normal limits that - Ammonia> within normal limits - CBC w/Diff-within normal limits - Vitamin D  (25 hydroxy)-patient reports he is supplementing - B12 and Folate Panel-patient reports he is supplementing - Vitamin B1-within normal limits - TSH-within normal limits - Urinalysis w microscopic + reflex cultur-within normal limits  Iron  deficiency Restarted iron  polysaccharide-tolerating   Constipation: Continue Amitiza  twice daily-working on  Depression/anxiety/sleep disturbance Increase Seroquel  1/2 tab in the morning 1.5 tab before bed. - Continue short acting Wellbutrin  150 mg in the morning -Continue Paxil  40 mg daily  Memory: Neuro reestablished MRI of the brain does not show any acute changes or reversible findings, such as stroke, mass or brain lesion. There are moderate chronic small-vessel ischemic changes of the deep and subcortical white matter, this has worsened since 2021 MRI.   Aricept  5 mg nightly started today These types of changes can cause impairment in cognitive skills and memory.    Gastroesophageal reflux disease, esophagitis presence not specified Stable Continue  Protonix  40 mg twice a day, was unable to take back secondary to recurrence of symptoms. Continue  Pepcid  twice daily today B12, vitamin D  and mag up-to-date  Chronic bronchitis, unspecified chronic bronchitis  type (HCC) Stable continue Symbicort  twice daily  Atherosclerosis of native coronary artery of native heart without angina pectoris/Essential hypertension/HYPERCHOLESTEROLEMIA/History of MI (myocardial infarction) Stage 3 chronic kidney disease/ iron  deficiency anemia/acquired thrombophilia stable - following with cards, Dr. Nishan. Continue  atorvastatin  Continue  metoprolol  50 mg Continue amlodipine  to 5 mg daily -  plavix  continue per cardiology - low sodium diet and exercise encouraged.   Type 2 diabetes mellitus with hyperlipidemia/CKD 3 - A1c today 5.8--> 6.0-->6.1>>6.7> 6.5> 7.1 >7.1> 6.7> 6.8 >  7.8> 7.5> 8.1 > 8.3 > 9.3> 8.2 > 8.6>6.9 today!!! Goal of A1c less than 7 Continue Farxiga  10 mg daily (medvtx) DECREASE glipizide  10 every day with dinner INCREASE mounjaro 5 mg weekly.  Tried: Metformin -discontinued secondary to diarrhea, [name] -patient has reservations about using since hospitalization for COVID-pneumonia and [name]  occurring at the same time. Continue atorvastatin  80 - Foot exam: Completed 09/22/2023 - eye exam: Completed 05/2023 - PNA: Series completed - Flu: Completed 09/22/2023 - Urine Microalbumin w/creat. Ratio: completed 06/09/2023  Cerebral atrophy/microvascular ischemic occlusive disease Patient is having mild cognitive changes. Referral had been placed to neurology Patient is on a statin  Iron  deficiency anemia: Iron  panel is up-to-date.  Patient cannot tolerate iron  supplement, was encouraged to at least start Flintstone vitamin  Nocturia Encouraged him to follow-up with his urologist.   Continue Flomax prescribed by urology.  Continue Myrbetriq  to 50 mg  Scheduled for surgery this week consistent with vascular dementia.  Reviewed expectations re: course of current medical issues. Discussed self-management of symptoms. Outlined signs and symptoms indicating need for more acute intervention. Patient verbalized understanding and all questions  were answered. Patient received an After-Visit Summary.    Orders Placed This Encounter  Procedures   Hemoglobin A1c   Basic Metabolic Panel (BMET)   No orders of the defined types were placed in this encounter.  Referral Orders  No referral(s) requested today     62 minutes spent on the day of service and patient's encounter ordering stat labs, image, discussing worsening cognitive decline with patient's family and patient, placing appropriate referrals.   Note is dictated utilizing voice recognition software. Although note has been proof read prior to signing, occasional typographical errors still can be missed. If any questions arise, please do not hesitate to call for verification.   electronically signed by:  Napolean Backbone, DO   Primary Care - [place]

## 2024-03-25 LAB — BASIC METABOLIC PANEL WITH GFR
BUN: 21 mg/dL (ref 6–23)
CO2: 26 meq/L (ref 19–32)
Calcium: 9.6 mg/dL (ref 8.4–10.5)
Chloride: 102 meq/L (ref 96–112)
Creatinine, Ser: 1.41 mg/dL (ref 0.40–1.50)
GFR: 48.43 mL/min — ABNORMAL LOW (ref >60.00)
Glucose, Bld: 272 mg/dL — ABNORMAL HIGH (ref 70–99)
Potassium: 4.7 meq/L (ref 3.5–5.1)
Sodium: 138 meq/L (ref 135–145)

## 2024-03-25 LAB — HEMOGLOBIN A1C: Hgb A1c MFr Bld: 7.1 % — ABNORMAL HIGH (ref 4.6–6.5)

## 2024-03-26 ENCOUNTER — Other Ambulatory Visit: Payer: Self-pay | Admitting: Family Medicine

## 2024-03-26 ENCOUNTER — Encounter: Payer: Self-pay | Admitting: Family Medicine

## 2024-03-26 DIAGNOSIS — E1169 Type 2 diabetes mellitus with other specified complication: Secondary | ICD-10-CM

## 2024-03-26 MED ORDER — MOUNJARO 7.5 MG/0.5ML ~~LOC~~ SOAJ
7.5000 mg | SUBCUTANEOUS | 3 refills | Status: DC
Start: 2024-03-26 — End: 2024-08-16

## 2024-04-01 DIAGNOSIS — H02423 Myogenic ptosis of bilateral eyelids: Secondary | ICD-10-CM | POA: Diagnosis not present

## 2024-04-01 DIAGNOSIS — L719 Rosacea, unspecified: Secondary | ICD-10-CM | POA: Diagnosis not present

## 2024-04-01 DIAGNOSIS — B079 Viral wart, unspecified: Secondary | ICD-10-CM | POA: Diagnosis not present

## 2024-04-01 DIAGNOSIS — L711 Rhinophyma: Secondary | ICD-10-CM | POA: Diagnosis not present

## 2024-04-01 DIAGNOSIS — L570 Actinic keratosis: Secondary | ICD-10-CM | POA: Diagnosis not present

## 2024-04-12 DIAGNOSIS — C44212 Basal cell carcinoma of skin of right ear and external auricular canal: Secondary | ICD-10-CM | POA: Diagnosis not present

## 2024-04-12 DIAGNOSIS — C4441 Basal cell carcinoma of skin of scalp and neck: Secondary | ICD-10-CM | POA: Diagnosis not present

## 2024-04-12 DIAGNOSIS — L578 Other skin changes due to chronic exposure to nonionizing radiation: Secondary | ICD-10-CM | POA: Diagnosis not present

## 2024-04-12 DIAGNOSIS — D485 Neoplasm of uncertain behavior of skin: Secondary | ICD-10-CM | POA: Diagnosis not present

## 2024-04-29 ENCOUNTER — Encounter: Payer: Self-pay | Admitting: Pharmacist

## 2024-04-29 NOTE — Progress Notes (Signed)
 04/29/2024 Name: [name] MRN: [id number] DOB: 31-Aug-1946  Chief Complaint  Patient presents with   Medication Adherence   Diabetes    [name] is a 78 y.o. year old male. He was outreached by the clinical pharmacist today. Spoke with patient and his wife.    Subjective: Patient's wife called to ask how to reorder Farxiga  - he only has one tablet left. [place] I called to reorder but [place] that patient was not re-enrolled for 2025. There is an application signed by patient PCP scanned to his chart.   I also received an alert that aptient is past due to refill atorvastatni 80mg . Dr Marylee Snowball sent in update Rx for 90 days 03/24/2024 but was profiled. [name] that he is taking atorvastatin  every day and they they have 2 bottles on hand and he does not need a refill at this time.    Medication Access/Adherence  Patient reports affordability concerns with their medications: Yes  - cost of Farxiga  would be $200 at the pharmacy) .  [place]  for 2024. His last shipment was 10/2023.  Patient reports access/transportation concerns to their pharmacy: No  Patient reports adherence concerns with their medications:  No      Diabetes:  Current medications: Farxiga  10mg  daily, Mounjaro 7.5mg  weekly and glipizide  10 mg - once a day  Medications tried in the past: metformin  - GI side effects  Denies hypoglycemic s/sx including No dizziness, shakiness, sweating.  Denies hyperglycemic symptoms including polyuria,    Medication Adherence:  Atorvastatin  80mg  refilled 90 ds 12/11/2022 Glipizide  10mg  # 180 was filled 02/25/2024 and #90 filled 01/18/2024   Objective:  Lab Results  Component Value Date   HGBA1C 7.1 (H) 03/24/2024    Lab Results  Component Value Date   CREATININE 1.41 03/24/2024   BUN 21 03/24/2024   NA 138 03/24/2024   K 4.7 03/24/2024   CL 102 03/24/2024   CO2 26 03/24/2024     Lab Results  Component Value Date   CHOL 120 01/09/2022   HDL 44 01/09/2022   LDLCALC 45 01/09/2022   LDLDIRECT 57.0 09/22/2023   TRIG 262 (H) 01/09/2022   CHOLHDL 2.7 01/09/2022    Current Outpatient Medications  Medication Instructions   amLODipine  (NORVASC ) 5 mg, Oral, Daily   atorvastatin  (LIPITOR) 80 mg, Oral, Daily   blood glucose meter kit and supplies Dispense based on patient and insurance preference. Use up to three times daily as directed. (FOR ICD-10 E10.9, E11.9).   buPROPion  (WELLBUTRIN  SR) 150 mg, Oral, Daily   clopidogrel  (PLAVIX ) 75 mg, Oral, Daily   Cyanocobalamin  (VITAMIN B 12 PO) Take by mouth.   dapagliflozin  propanediol (FARXIGA ) 10 mg, Oral, Daily before breakfast   donepezil  (ARICEPT  ODT) 5 mg, Oral, Daily at bedtime   famotidine  (PEPCID ) 20 mg, Oral, 2 times daily   finasteride (PROSCAR) 5 mg, Daily   glipiZIDE  (GLUCOTROL ) 10 MG tablet 1 tab with dinner   glucose blood (ACCU-CHEK GUIDE) test strip 1 each, Other, Daily   lubiprostone  (AMITIZA ) 8 mcg, Oral, 2 times daily with meals   MAG64 64 MG TBEC TAKE 1 TABLET (64 MG TOTAL) BY MOUTH 2 (TWO) TIMES DAILY.   metoprolol  tartrate (LOPRESSOR ) 50 mg, Oral, Daily   mirabegron  ER (MYRBETRIQ ) 50 mg, Oral, Daily   Mounjaro 7.5 mg, Subcutaneous, Weekly   nitroGLYCERIN  (NITROSTAT ) 0.4 mg, Sublingual, Every 5  min PRN   pantoprazole  (PROTONIX ) 40 mg, Oral, 2 times daily   PARoxetine  (PAXIL ) 40 mg, Oral, Daily   Polysaccharide Iron  Complex 434.8 (200 Fe) MG CAPS 1 capsule, Oral, Daily   QUEtiapine  (SEROQUEL ) 100 MG tablet 1/2 tab in the morning, 1.5 tab before bed   SYMBICORT  160-4.5 MCG/ACT inhaler 2 puffs, Inhalation, 2 times daily   tamsulosin (FLOMAX) 0.4 MG CAPS capsule No dose, route, or frequency recorded.     Assessment/Plan:   Diabetes: A1c improved -  Goal is < 7.0%  - Continue checking blood glucose 1 to 2 times daily at varying times of day and record.  - Continue Mounjaro 7.5mg  weekly,  glipizide  10mg  once day and Farxiga  10mg  daily.  - Resent 2025 re-enrollment application for Farxiga .  Med Adherence - discussed importance of taking statin as prescription. Patient's wife endorses he is taking atorvastatin  daily and that they have plenty on hand.   Follow Up Plan: 2 weeks to check on [place] / shipment.    Cecilie Coffee, PharmD Clinical Pharmacist [facility]  Population Health  [phone]

## 2024-05-13 ENCOUNTER — Other Ambulatory Visit (HOSPITAL_COMMUNITY): Payer: Self-pay

## 2024-05-13 DIAGNOSIS — B079 Viral wart, unspecified: Secondary | ICD-10-CM | POA: Diagnosis not present

## 2024-05-13 DIAGNOSIS — L711 Rhinophyma: Secondary | ICD-10-CM | POA: Diagnosis not present

## 2024-05-13 DIAGNOSIS — H02423 Myogenic ptosis of bilateral eyelids: Secondary | ICD-10-CM | POA: Diagnosis not present

## 2024-05-13 DIAGNOSIS — L570 Actinic keratosis: Secondary | ICD-10-CM | POA: Diagnosis not present

## 2024-05-13 DIAGNOSIS — L719 Rosacea, unspecified: Secondary | ICD-10-CM | POA: Diagnosis not present

## 2024-05-17 ENCOUNTER — Encounter: Payer: Self-pay | Admitting: Pharmacist

## 2024-05-17 NOTE — Progress Notes (Signed)
 05/17/2024 Name: [name] MRN: [id number] DOB: [birth year]  Chief Complaint  Patient presents with   Medication Management    [name] is a 78 y.o. year old male. Coordinated with [place] medication [place] program today regarding patient's Farxiga  medication assistance program.    Subjective: Patient's wife lefta a VM message to ask about when to expect his next delivery of Farxiga  from [place] [employer] [place] Program.   Patient reports affordability concerns with their medications: Yes  - cost of Farxiga  would be $200 at the pharmacy. SABRA  [place]iga  for 2024 and has been approved for 2025 but program was waiting on patients signature. Was sent last week by [place] [employer] [place] by fax.  Patient reports access/transportation concerns to their pharmacy: No  Patient reports adherence concerns with their medications:  No      Diabetes:  Current medications: Farxiga  10mg  daily, Mounjaro  7.5mg  weekly and glipizide  10 mg - once a day  Medications tried in the past: metformin  - GI side effects   Objective:  Lab Results  Component Value Date   HGBA1C 7.1 (H) 03/24/2024    Lab Results  Component Value Date   CREATININE 1.41 03/24/2024   BUN 21 03/24/2024   NA 138 03/24/2024   K 4.7 03/24/2024   CL 102 03/24/2024   CO2 26 03/24/2024    Lab Results  Component Value Date   CHOL 120 01/09/2022   HDL 44 01/09/2022   LDLCALC 45 01/09/2022   LDLDIRECT 57.0 09/22/2023   TRIG 262 (H) 01/09/2022   CHOLHDL 2.7 01/09/2022    Current Outpatient Medications  Medication Instructions   amLODipine  (NORVASC ) 5 mg, Oral, Daily   atorvastatin  (LIPITOR) 80 mg, Oral, Daily   blood glucose meter kit and supplies Dispense based on patient and insurance preference. Use up to three times daily as directed. (FOR ICD-10 E10.9, E11.9).   buPROPion  (WELLBUTRIN  SR) 150 mg, Oral, Daily   clopidogrel  (PLAVIX ) 75 mg, Oral, Daily   Cyanocobalamin  (VITAMIN B 12 PO) Take by mouth.    dapagliflozin  propanediol (FARXIGA ) 10 mg, Oral, Daily before breakfast   donepezil  (ARICEPT  ODT) 5 mg, Oral, Daily at bedtime   famotidine  (PEPCID ) 20 mg, Oral, 2 times daily   finasteride (PROSCAR) 5 mg, Daily   glipiZIDE  (GLUCOTROL ) 10 MG tablet 1 tab with dinner   glucose blood (ACCU-CHEK GUIDE) test strip 1 each, Other, Daily   lubiprostone  (AMITIZA ) 8 mcg, Oral, 2 times daily with meals   MAG64 64 MG TBEC TAKE 1 TABLET (64 MG TOTAL) BY MOUTH 2 (TWO) TIMES DAILY.   metoprolol  tartrate (LOPRESSOR ) 50 mg, Oral, Daily   mirabegron  ER (MYRBETRIQ ) 50 mg, Oral, Daily   Mounjaro  7.5 mg, Subcutaneous, Weekly   nitroGLYCERIN  (NITROSTAT ) 0.4 mg, Sublingual, Every 5 min PRN   pantoprazole  (PROTONIX ) 40 mg, Oral, 2 times daily   PARoxetine  (PAXIL ) 40 mg, Oral, Daily   Polysaccharide Iron  Complex 434.8 (200 Fe) MG CAPS 1 capsule, Oral, Daily   QUEtiapine  (SEROQUEL ) 100 MG tablet 1/2 tab in the morning, 1.5 tab before bed   SYMBICORT  160-4.5 MCG/ACT inhaler 2 puffs, Inhalation, 2 times daily   tamsulosin (FLOMAX) 0.4 MG CAPS capsule No dose, route, or frequency recorded.     Assessment/Plan:   Diabetes: A1c improved -  Goal is < 7.0%  - Continue checking blood glucose 1 to 2 times daily at varying times of day and record.  - Continue Mounjaro   7.5mg  weekly, glipizide  10mg  once day and Farxiga  10mg  daily.  - Called [place] medication [place] program - patient was approved 05/12/2024 (Patient ID - [id number])  Follow Up Plan: 2 weeks to check on [place] application / shipment.    [name], PharmD Clinical Pharmacist [facility]  Population Health  [phone]

## 2024-05-26 ENCOUNTER — Other Ambulatory Visit: Payer: Self-pay | Admitting: Family Medicine

## 2024-06-02 ENCOUNTER — Encounter: Payer: Self-pay | Admitting: Pharmacist

## 2024-06-02 NOTE — Progress Notes (Signed)
 06/02/2024 Name: [name] MRN: [id number] DOB: 1946/02/18  Chief Complaint  Patient presents with   Medication Management    Cost of Symbicort     [name] is a 78 y.o. year old male. Received a call from patinet's wife yesterday that she tried to pick up a presriptino for his Symbicort  inhaler and [facility] was > [facility]600. She asked if I could check on this or make a recommendation for an alternative.     Subjective: Patient's wife lefta a VM message to ask about when to expect his next delivery of Farxiga  from [facility] and Me Program.   Patient reports affordability concerns with their medications: Yes  - cost of Symbicort  was unexpectantly high.   Patient reports access/transportation concerns to their pharmacy: No  Patient reports adherence concerns with their medications:  No       Objective:  Lab Results  Component Value Date   HGBA1C 7.1 (H) 03/24/2024    Lab Results  Component Value Date   CREATININE 1.41 03/24/2024   BUN 21 03/24/2024   NA 138 03/24/2024   K 4.7 03/24/2024   CL 102 03/24/2024   CO2 26 03/24/2024    Lab Results  Component Value Date   CHOL 120 01/09/2022   HDL 44 01/09/2022   LDLCALC 45 01/09/2022   LDLDIRECT 57.0 09/22/2023   TRIG 262 (H) 01/09/2022   CHOLHDL 2.7 01/09/2022    Current Outpatient Medications  Medication Instructions   amLODipine  (NORVASC ) 5 mg, Oral, Daily   atorvastatin  (LIPITOR) 80 mg, Oral, Daily   blood glucose meter kit and supplies Dispense based on patient and insurance preference. Use up to three times daily as directed. (FOR ICD-10 E10.9, E11.9).   buPROPion  (WELLBUTRIN  SR) 150 mg, Oral, Daily   clopidogrel  (PLAVIX ) 75 mg, Oral, Daily   Cyanocobalamin  (VITAMIN B 12 PO) Take by mouth.   dapagliflozin  propanediol (FARXIGA ) 10 mg, Oral, Daily before breakfast   donepezil  (ARICEPT  ODT) 5 mg, Oral, Daily at bedtime   famotidine  (PEPCID ) 20 mg, Oral, 2 times daily   finasteride (PROSCAR) 5 mg, Daily    glipiZIDE  (GLUCOTROL ) 10 MG tablet TAKE 1 TABLET (10 MG TOTAL) BY MOUTH TWICE A DAY BEFORE A MEAL   glucose blood (ACCU-CHEK GUIDE) test strip 1 each, Other, Daily   lubiprostone  (AMITIZA ) 8 mcg, Oral, 2 times daily with meals   MAG64 64 MG TBEC TAKE 1 TABLET (64 MG TOTAL) BY MOUTH 2 (TWO) TIMES DAILY.   metoprolol  tartrate (LOPRESSOR ) 50 mg, Oral, Daily   mirabegron  ER (MYRBETRIQ ) 50 mg, Oral, Daily   Mounjaro  7.5 mg, Subcutaneous, Weekly   nitroGLYCERIN  (NITROSTAT ) 0.4 mg, Sublingual, Every 5 min PRN   pantoprazole  (PROTONIX ) 40 mg, Oral, 2 times daily   PARoxetine  (PAXIL ) 40 mg, Oral, Daily   Polysaccharide Iron  Complex 434.8 (200 Fe) MG CAPS 1 capsule, Oral, Daily   QUEtiapine  (SEROQUEL ) 100 MG tablet 1/2 tab in the morning, 1.5 tab before bed   SYMBICORT  160-4.5 MCG/ACT inhaler 2 puffs, Inhalation, 2 times daily   tamsulosin (FLOMAX) 0.4 MG CAPS capsule No dose, route, or frequency recorded.     Assessment/Plan:  Medication Access: - Reviewed his [facility] Medicare plan - brand Symbicort  should be $47 / month or less.  - [facility] and [facility]-run prescription for BRAND Symbicort  and for only 1 month - cost was $0 (patient has met $2000 out of pocket max for 2025). Notified patient's wife.  [name], PharmD Clinical Pharmacist [facility]  Population Health  [phone]

## 2024-06-14 DIAGNOSIS — C44212 Basal cell carcinoma of skin of right ear and external auricular canal: Secondary | ICD-10-CM | POA: Diagnosis not present

## 2024-06-19 ENCOUNTER — Other Ambulatory Visit: Payer: Self-pay | Admitting: Family Medicine

## 2024-06-23 ENCOUNTER — Other Ambulatory Visit: Payer: Self-pay | Admitting: Family Medicine

## 2024-07-05 DIAGNOSIS — L905 Scar conditions and fibrosis of skin: Secondary | ICD-10-CM | POA: Diagnosis not present

## 2024-07-05 DIAGNOSIS — C4441 Basal cell carcinoma of skin of scalp and neck: Secondary | ICD-10-CM | POA: Diagnosis not present

## 2024-08-04 ENCOUNTER — Ambulatory Visit (INDEPENDENT_AMBULATORY_CARE_PROVIDER_SITE_OTHER): Admitting: Family Medicine

## 2024-08-04 VITALS — BP 122/82 | HR 90 | Temp 98.3°F | Wt 206.0 lb

## 2024-08-04 DIAGNOSIS — Z23 Encounter for immunization: Secondary | ICD-10-CM | POA: Diagnosis not present

## 2024-08-04 DIAGNOSIS — R1084 Generalized abdominal pain: Secondary | ICD-10-CM

## 2024-08-04 DIAGNOSIS — K2270 Barrett's esophagus without dysplasia: Secondary | ICD-10-CM | POA: Insufficient documentation

## 2024-08-04 DIAGNOSIS — R140 Abdominal distension (gaseous): Secondary | ICD-10-CM | POA: Diagnosis not present

## 2024-08-04 DIAGNOSIS — Z79899 Other long term (current) drug therapy: Secondary | ICD-10-CM

## 2024-08-04 DIAGNOSIS — Z5181 Encounter for therapeutic drug level monitoring: Secondary | ICD-10-CM | POA: Diagnosis not present

## 2024-08-04 DIAGNOSIS — R066 Hiccough: Secondary | ICD-10-CM | POA: Insufficient documentation

## 2024-08-04 DIAGNOSIS — K2100 Gastro-esophageal reflux disease with esophagitis, without bleeding: Secondary | ICD-10-CM

## 2024-08-04 DIAGNOSIS — G44219 Episodic tension-type headache, not intractable: Secondary | ICD-10-CM | POA: Diagnosis not present

## 2024-08-04 DIAGNOSIS — R1013 Epigastric pain: Secondary | ICD-10-CM | POA: Diagnosis not present

## 2024-08-04 HISTORY — DX: Abdominal distension (gaseous): R14.0

## 2024-08-04 MED ORDER — BACLOFEN 5 MG PO TABS: 5.0000 mg | ORAL_TABLET | Freq: Three times a day (TID) | ORAL | 0 refills | Status: DC

## 2024-08-04 NOTE — Progress Notes (Signed)
 [name] , 12-24-1945, 78 y.o., male MRN: [id number] Patient Care Team    Relationship Specialty Notifications Start End  [name] LABOR, DO PCP - General Family Medicine  12/25/15   [name], MD PCP - Cardiology Cardiology Admissions 08/20/18   [name], MD Consulting Physician Hematology  09/03/16   [name], MD Consulting Physician Cardiology  09/03/16   [name], MD Consulting Physician Gastroenterology  09/03/16   Hollar, [name], MD Referring Physician Dermatology  06/29/19   [name], OD Referring Physician   01/31/23   [name], MD Consulting Physician Urology  02/24/24     Chief Complaint  Patient presents with   GI Problem    3 weeks; increased acid reflux, bloating, abdominal pain. Denies constipation/diarrhea .Pt has not taken anything to try and relieve sx.      Subjective: [name] is a 78 y.o. Pt presents for an OV with complaints of 3 weeks of reflux sx, abd pain and bloating.  Associated symptoms include belching and nausea.  He reports the discomfort he notices is mostly surrounding his umbilicus.  After meals he has noticed increased belching and bloating.  He reports his bowel movements have been routine, but bowel movements that are more green in color. He also complains of increase in headaches around his temple area bilaterally.  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  Headache is responding to OTC med Denies fever, chills, vomiting, increased fatigue or diarrhea.  Denies blood per rectum. GI_ Dr. Dianna. H/O barrett's esophagus. Last EGD 2017-established with digestive health 2023-no records Pt has tried nothing to ease their symptoms.      08/04/2024    2:44 PM 03/24/2024    2:21 PM 12/22/2023    1:54 PM 09/22/2023    2:00 PM 06/09/2023    1:23 PM  Depression screen PHQ 2/9  Decreased  Interest 0 2 1 2  0  Down, Depressed, Hopeless 0 1 1 1 1   PHQ - 2 Score 0 3 2 3 1   Altered sleeping 1 2 2 1 1   Tired, decreased energy 1 2 1  0 0  Change in appetite 0 1 1 1 1   Feeling bad or failure about yourself  0 0 0 1 0  Trouble concentrating 1  1 1  0  Moving slowly or fidgety/restless 1 0 0 1 0  Suicidal thoughts 0 0 0 0 0  PHQ-9 Score 4 8 7 8 3   Difficult doing work/chores Somewhat difficult  Not difficult at all Not difficult at all     Allergies  Allergen Reactions   Altace [Ramipril] Cough   Latex Hives   Losartan  Cough    cough   Remeron  [Mirtazapine ] Other (See Comments)    Cognitive changes   Tape     Blisters    Zantac [Ranitidine Hcl] Dermatitis   Codeine     Makes sick   Social History   Social History Narrative   Lives at home with wife. Married to [name].   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    Drinks caffeine (3 cups coffee, 2 sodas per day),  Takes a daily vitamin, wears a seatbelt   Wears a bicycle helmet.    Has dentures (partial on top)   Smoke detector in the home. No firearms in the home.  Feels safe in relationships.     Right-handed.   Past Medical History:  Diagnosis Date   Achilles tendinitis of left lower extremity 07/20/2018   Adenomatous colon polyp 2011   Barrett esophagus 12/2013   EGD   CAD (coronary artery disease)    ETT/Lexiscan -Myoview  (11/14):  Normal; no ischemia, EF 67%   CKD (chronic kidney disease), stage III (HCC)    Colon polyp 09/2010   adenoma   COPD (chronic obstructive pulmonary disease) (HCC) 2009   spirometry with moderate COPD   Depression    Diabetes mellitus without complication (HCC)    Dyspnea    GERD (gastroesophageal reflux disease)    GI bleed    after polypectomy/admitted   Hair loss 08/07/2018   HTN (hypertension)    Hypercholesterolemia    Hypomagnesemia 09/15/2022   Memory loss    Myocardial infarction (HCC)    Obesity    Pneumonia 09/15/2022   Sleep apnea    Spontaneous pneumothorax    Synovial  cyst of right popliteal space 07/20/2018   Vertigo    Past Surgical History:  Procedure Laterality Date   APPENDECTOMY     CORONARY ANGIOPLASTY WITH STENT PLACEMENT  2007   LAD   IR ANGIOGRAM PELVIS SELECTIVE OR SUPRASELECTIVE  12/24/2023   IR ANGIOGRAM SELECTIVE EACH ADDITIONAL VESSEL  12/24/2023   IR ANGIOGRAM SELECTIVE EACH ADDITIONAL VESSEL  12/24/2023   IR EMBO ARTERIAL NOT HEMORR HEMANG INC GUIDE ROADMAPPING  12/24/2023   IR RADIOLOGIST EVAL & MGMT  11/24/2023   IR US  GUIDE VASC ACCESS LEFT  12/24/2023   IR US  GUIDE VASC ACCESS RIGHT  12/24/2023   PLEURAL SCARIFICATION  1987   TONSILLECTOMY     VARICOCELECTOMY     Family History  Problem Relation Age of Onset   Heart disease Mother    CVA Mother    Dementia Mother    Coronary artery disease Father 81   Heart disease Father    Stroke Father    Dementia Sister    Cancer - Other Brother    Hypertension Other    Hyperlipidemia Other    Colon polyps Sister    Cancer Sister    Allergies as of 08/04/2024       Reactions   Altace [ramipril] Cough   Latex Hives   Losartan  Cough   cough   Remeron  [mirtazapine ] Other (See Comments)   Cognitive changes   Tape    Blisters   Zantac [ranitidine Hcl] Dermatitis   Codeine    Makes sick        Medication List        Accurate as of August 04, 2024  3:04 PM. If you have any questions, ask your nurse or doctor.          Accu-Chek Guide test strip Generic drug: glucose blood 1 each by Other route daily.   amLODipine  5 MG tablet Commonly known as: NORVASC  Take 1 tablet (5 mg total) by mouth daily.   atorvastatin  80 MG tablet Commonly known as: LIPITOR Take 1 tablet (80 mg total) by mouth daily.   Baclofen  5 MG Tabs Take 1 tablet (5 mg total) by mouth every 8 (eight) hours. Started by: Charlies Bellini   blood glucose meter kit and supplies Dispense based on patient and insurance preference. Use up to three times daily as directed. (FOR ICD-10 E10.9, E11.9).    buPROPion  150 MG 12 hr tablet Commonly known as: WELLBUTRIN  SR Take 1 tablet (150 mg total)  by mouth daily.   clopidogrel  75 MG tablet Commonly known as: PLAVIX  TAKE 1 TABLET BY MOUTH EVERY DAY   dapagliflozin  propanediol 10 MG Tabs tablet Commonly known as: Farxiga  Take 1 tablet (10 mg total) by mouth daily before breakfast.   donepezil  5 MG disintegrating tablet Commonly known as: ARICEPT  ODT Take 1 tablet (5 mg total) by mouth at bedtime.   famotidine  20 MG tablet Commonly known as: Pepcid  Take 1 tablet (20 mg total) by mouth 2 (two) times daily.   finasteride 5 MG tablet Commonly known as: PROSCAR Take 5 mg by mouth daily.   glipiZIDE  10 MG tablet Commonly known as: GLUCOTROL  TAKE 1 TABLET (10 MG TOTAL) BY MOUTH TWICE A DAY BEFORE A MEAL   lubiprostone  8 MCG capsule Commonly known as: AMITIZA  Take 1 capsule (8 mcg total) by mouth 2 (two) times daily with a meal.   Mag64 64 MG Tbec Generic drug: Magnesium  Chloride TAKE 1 TABLET (64 MG TOTAL) BY MOUTH 2 (TWO) TIMES DAILY.   metoprolol  tartrate 50 MG tablet Commonly known as: LOPRESSOR  Take 1 tablet (50 mg total) by mouth daily.   mirabegron  ER 50 MG Tb24 tablet Commonly known as: Myrbetriq  Take 1 tablet (50 mg total) by mouth daily.   Mounjaro  7.5 MG/0.5ML Pen Generic drug: tirzepatide  Inject 7.5 mg into the skin once a week.   nitroGLYCERIN  0.4 MG SL tablet Commonly known as: NITROSTAT  Place 1 tablet (0.4 mg total) under the tongue every 5 (five) minutes as needed for chest pain (3 doses max).   pantoprazole  40 MG tablet Commonly known as: PROTONIX  Take 1 tablet (40 mg total) by mouth 2 (two) times daily.   PARoxetine  40 MG tablet Commonly known as: PAXIL  Take 1 tablet (40 mg total) by mouth daily.   Polysaccharide Iron  Complex 434.8 (200 Fe) MG Caps Take 1 capsule by mouth daily.   QUEtiapine  100 MG tablet Commonly known as: SEROquel  1/2 tab in the morning, 1.5 tab before bed   Symbicort   160-4.5 MCG/ACT inhaler Generic drug: budesonide-formoterol Inhale 2 puffs into the lungs 2 (two) times daily.   tamsulosin 0.4 MG Caps capsule Commonly known as: FLOMAX   VITAMIN B 12 PO Take by mouth.        All past medical history, surgical history, allergies, family history, immunizations andmedications were updated in the EMR today and reviewed under the history and medication portions of their EMR.     ROS Negative, with the exception of above mentioned in HPI   Objective:  BP 122/82   Pulse 90   Temp 98.3 F (36.8 C)   Wt 206 lb (93.4 kg)   SpO2 96%   BMI 28.73 kg/m  Body mass index is 28.73 kg/m.  Physical Exam Vitals and nursing note reviewed. Exam conducted with a chaperone present.  Constitutional:      General: He is not in acute distress.    Appearance: Normal appearance. He is not ill-appearing, toxic-appearing or diaphoretic.  HENT:     Head: Normocephalic and atraumatic.  Eyes:     General: No scleral icterus.       Right eye: No discharge.        Left eye: No discharge.     Extraocular Movements: Extraocular movements intact.     Pupils: Pupils are equal, round, and reactive to light.  Abdominal:     General: Abdomen is protuberant. Bowel sounds are normal. There is distension.     Palpations: Abdomen is soft. There  is no fluid wave or hepatomegaly.     Tenderness: There is abdominal tenderness in the periumbilical area and left upper quadrant. There is no right CVA tenderness, left CVA tenderness, guarding or rebound. Negative signs include Murphy's sign and McBurney's sign.     Hernia: A hernia is present. Hernia is present in the umbilical area.   Skin:    General: Skin is warm and dry.     Coloration: Skin is not jaundiced or pale.     Findings: No rash.  Neurological:     Mental Status: He is alert and oriented to person, place, and time. Mental status is at baseline.  Psychiatric:        Mood and Affect: Mood normal.        Behavior:  Behavior normal.        Thought Content: Thought content normal.        Judgment: Judgment normal.      No results found. No results found. No results found for this or any previous visit (from the past 24 hours).  Assessment/Plan: [name] is a 78 y.o. male present for OV for  Influenza vaccine needed - Flu vaccine HIGH DOSE PF(Fluzone Trivalent)  Generalized abdominal pain/abdominal bloating/gastroesophageal reflux disease with esophagitis without hemorrhage/Barrett's esophagus/long-term PPI Discussed differential diagnosis of possible abdominal pain that has been occurring over 3 weeks and worsening.  Differential includes enlarging hiatal hernia, enlarging umbilical hernia, colitis, pancreatitis or other GI etiology. Encouraged him to contact digestive health specialist, which was the last GI team had seen him for records.  May need to refer back to them if labs or CT indicate need. He is overdue for EGD for his Barrett's esophagus and colonoscopy by our records, but we do not have digestive health records. -- C-reactive protein - CBC w/Diff - Comp Met (CMET) - Lipase - Sedimentation rate - CT ABDOMEN PELVIS W CONTRAST; Future  Episodic tension-type headache, not intractable Tension causing headaches likely from his recent skin cancer removal on the back of his neck, causing him to keep his neck straight and tends to allow to heal. Start baclofen  5 mg 3 times daily  Intractable hiccups Known hiatal hernia, possibly enlarging with abdominal discomfort, belching, nausea and hiccups reported. He is on Mounjaro , which also may be contributing with slower gastric emptying. Start baclofen  5 mg 3 times daily for 2 weeks, then taper off-instructions provided  Reviewed expectations re: course of current medical issues. Discussed self-management of symptoms. Outlined signs and symptoms indicating need for more acute intervention. Patient verbalized understanding and all  questions were answered. Patient received an After-Visit Summary.    Orders Placed This Encounter  Procedures   CT ABDOMEN PELVIS W CONTRAST   Flu vaccine HIGH DOSE PF(Fluzone Trivalent)   C-reactive protein   CBC w/Diff   Comp Met (CMET)   Lipase   Sedimentation rate   Meds ordered this encounter  Medications   Baclofen  5 MG TABS    Sig: Take 1 tablet (5 mg total) by mouth every 8 (eight) hours.    Dispense:  90 tablet    Refill:  0   Referral Orders  No referral(s) requested today     Note is dictated utilizing voice recognition software. Although note has been proof read prior to signing, occasional typographical errors still can be missed. If any questions arise, please do not hesitate to call for verification.   electronically signed by:  Charlies Bellini, DO  [facility] -  OR

## 2024-08-05 ENCOUNTER — Ambulatory Visit
Admission: RE | Admit: 2024-08-05 | Discharge: 2024-08-05 | Disposition: A | Discharge status: Home / Self Care | Source: Ambulatory Visit | Attending: Family Medicine | Admitting: Family Medicine

## 2024-08-05 ENCOUNTER — Encounter: Payer: Self-pay | Admitting: Radiology

## 2024-08-05 ENCOUNTER — Ambulatory Visit: Payer: Self-pay | Admitting: Family Medicine

## 2024-08-05 DIAGNOSIS — R9389 Abnormal findings on diagnostic imaging of other specified body structures: Secondary | ICD-10-CM

## 2024-08-05 DIAGNOSIS — K219 Gastro-esophageal reflux disease without esophagitis: Secondary | ICD-10-CM | POA: Diagnosis not present

## 2024-08-05 DIAGNOSIS — R1084 Generalized abdominal pain: Secondary | ICD-10-CM

## 2024-08-05 LAB — CBC WITH DIFFERENTIAL/PLATELET
Absolute Lymphocytes: 1251 {cells}/uL (ref 850–3900)
Absolute Monocytes: 743 {cells}/uL (ref 200–950)
Basophils Absolute: 35 {cells}/uL (ref 0–200)
Basophils Relative: 0.3 %
Eosinophils Absolute: 142 {cells}/uL (ref 15–500)
Eosinophils Relative: 1.2 %
HCT: 36.4 % — ABNORMAL LOW (ref 38.5–50.0)
Hemoglobin: 11.8 g/dL — ABNORMAL LOW (ref 13.2–17.1)
MCH: 26.5 pg — ABNORMAL LOW (ref 27.0–33.0)
MCHC: 32.4 g/dL (ref 32.0–36.0)
MCV: 81.8 fL (ref 80.0–100.0)
MPV: 9.1 fL (ref 7.5–12.5)
Monocytes Relative: 6.3 %
Neutro Abs: 9629 {cells}/uL — ABNORMAL HIGH (ref 1500–7800)
Neutrophils Relative %: 81.6 %
Platelets: 412 Thousand/uL — ABNORMAL HIGH (ref 140–400)
RBC: 4.45 Million/uL (ref 4.20–5.80)
RDW: 15 % (ref 11.0–15.0)
Total Lymphocyte: 10.6 %
WBC: 11.8 Thousand/uL — ABNORMAL HIGH (ref 3.8–10.8)

## 2024-08-05 LAB — COMPREHENSIVE METABOLIC PANEL WITH GFR
AG Ratio: 1.1 (calc) (ref 1.0–2.5)
ALT: 64 U/L — ABNORMAL HIGH (ref 9–46)
AST: 31 U/L (ref 10–35)
Albumin: 3.9 g/dL (ref 3.6–5.1)
Alkaline phosphatase (APISO): 103 U/L (ref 35–144)
BUN/Creatinine Ratio: 11 (calc) (ref 6–22)
BUN: 16 mg/dL (ref 7–25)
CO2: 27 mmol/L (ref 20–32)
Calcium: 9.8 mg/dL (ref 8.6–10.3)
Chloride: 99 mmol/L (ref 98–110)
Creat: 1.41 mg/dL — ABNORMAL HIGH (ref 0.70–1.28)
Globulin: 3.5 g/dL (ref 1.9–3.7)
Glucose, Bld: 190 mg/dL — ABNORMAL HIGH (ref 65–99)
Potassium: 5 mmol/L (ref 3.5–5.3)
Sodium: 136 mmol/L (ref 135–146)
Total Bilirubin: 0.6 mg/dL (ref 0.2–1.2)
Total Protein: 7.4 g/dL (ref 6.1–8.1)
eGFR: 52 mL/min/1.73m2 — ABNORMAL LOW (ref >=60)

## 2024-08-05 LAB — C-REACTIVE PROTEIN: CRP: 73.7 mg/L — ABNORMAL HIGH (ref <8.0)

## 2024-08-05 LAB — LIPASE: Lipase: 56 U/L (ref 7–60)

## 2024-08-05 LAB — SEDIMENTATION RATE: Sed Rate: 79 mm/h — ABNORMAL HIGH (ref 0–20)

## 2024-08-05 MED ORDER — IOPAMIDOL (ISOVUE-300) INJECTION 61%
100.0000 mL | Freq: Once | INTRAVENOUS | Status: AC | PRN
  Administered 2024-08-05: 100 mL via INTRAVENOUS

## 2024-08-05 NOTE — Telephone Encounter (Signed)
 Left message on E-chart message since was unable to get a hold of patient or his wife at home or on cell. 1: Pleural plaques-pulmonology referral placed 2: No GI causes identified by CT- > make appt with GI crp very high needs egd and colonoscopy 3: Possibly Mounjaro  contributing to symptoms.>space out mounjaro  to 14 d 4.  Elevated CRP very high-mildly elevated liver enzyme, mildly elevated WBC 5.  Very large prostate> follow with uro  Follow up 2-4 weeks

## 2024-08-16 ENCOUNTER — Telehealth: Payer: Self-pay

## 2024-08-16 ENCOUNTER — Other Ambulatory Visit: Payer: Self-pay | Admitting: Family Medicine

## 2024-08-16 ENCOUNTER — Other Ambulatory Visit: Payer: Self-pay

## 2024-08-16 MED ORDER — MOUNJARO 7.5 MG/0.5ML ~~LOC~~ SOAJ: 7.5000 mg | SUBCUTANEOUS | 0 refills | Status: DC

## 2024-08-16 NOTE — Telephone Encounter (Signed)
 Communication  Reason for CRM: Patient has developed congestion and a cough and his wife would like to know if it's okay to give him mucinex or any other over the counter medication based on the medications he is on now.   Please advise.

## 2024-08-16 NOTE — Telephone Encounter (Signed)
 Copied from CRM [id number]. Topic: Clinical - Medication Refill >> Aug 16, 2024 12:07 PM Franky GRADE wrote: Medication: MOUNJARO  7.5 MG/0.5ML Pen [[id number]]  Has the patient contacted their pharmacy? Yes, they submitted a request to the office last week but have not gotten a response. (Agent: If no, request that the patient contact the pharmacy for the refill. If patient does not wish to contact the pharmacy document the reason why and proceed with request.) (Agent: If yes, when and what did the pharmacy advise?)  This is the patient's preferred pharmacy:  [id number] [place], [place] - [address] [place] [address] [place] [address] [place] [address] Phone: [phone] Fax: [phone]   Is this the correct pharmacy for this prescription? Yes If no, delete pharmacy and type the correct one.   Has the prescription been filled recently? No  Is the patient out of the medication? Yes, patient is due for the injection today.   Has the patient been seen for an appointment in the last year OR does the patient have an upcoming appointment? Yes  Can we respond through MyChart? Yes  Agent: Please be advised that Rx refills may take up to 3 business days. We ask that you follow-up with your pharmacy.

## 2024-08-16 NOTE — Telephone Encounter (Signed)
Pt wife aware and verbalized understanding.  

## 2024-08-16 NOTE — Telephone Encounter (Signed)
 Plain Mucinex or Coricidin is best

## 2024-08-22 ENCOUNTER — Other Ambulatory Visit: Payer: Self-pay | Admitting: Family Medicine

## 2024-08-24 ENCOUNTER — Telehealth: Payer: Self-pay

## 2024-08-24 NOTE — Telephone Encounter (Signed)
 PAP: Patient assistance application for Farxiga through [employer] (AZ&Me) has been mailed to pt's home address on file. Provider portion of application will be faxed to provider's office.

## 2024-08-26 ENCOUNTER — Other Ambulatory Visit: Payer: Self-pay | Admitting: Family Medicine

## 2024-08-26 DIAGNOSIS — R1084 Generalized abdominal pain: Secondary | ICD-10-CM

## 2024-08-26 DIAGNOSIS — K2270 Barrett's esophagus without dysplasia: Secondary | ICD-10-CM

## 2024-08-26 DIAGNOSIS — R140 Abdominal distension (gaseous): Secondary | ICD-10-CM

## 2024-09-03 NOTE — Telephone Encounter (Signed)
 Received provider portion pap application [place] Farxiga 

## 2024-09-15 ENCOUNTER — Other Ambulatory Visit: Payer: Self-pay | Admitting: Family Medicine

## 2024-09-16 ENCOUNTER — Other Ambulatory Visit: Payer: Self-pay | Admitting: Family Medicine

## 2024-09-21 ENCOUNTER — Ambulatory Visit: Admitting: Adult Health

## 2024-10-05 NOTE — Telephone Encounter (Signed)
 Reached out to patient regarding Pap application Farxiga  (AZ&ME) if it was received and left HIPAA compliant v/m to return call for any questions.

## 2024-10-08 ENCOUNTER — Other Ambulatory Visit: Payer: Self-pay | Admitting: Family Medicine

## 2024-10-09 ENCOUNTER — Other Ambulatory Visit: Payer: Self-pay | Admitting: Family Medicine

## 2024-10-11 ENCOUNTER — Other Ambulatory Visit: Payer: Self-pay

## 2024-10-11 MED ORDER — MOUNJARO 7.5 MG/0.5ML ~~LOC~~ SOAJ: 7.5000 mg | SUBCUTANEOUS | 0 refills | Status: DC

## 2024-10-11 NOTE — Telephone Encounter (Deleted)
 Communication  Reason for CRM:Pt stated that he is currently out of the MOUNJARO  7.5 MG/0.5ML Pen and is supposed to take a shot today. Pt is requesting that the refill request for this medication gets approved today and would like a callback with an update.   Rx sent.

## 2024-10-13 ENCOUNTER — Other Ambulatory Visit (HOSPITAL_COMMUNITY): Payer: Self-pay

## 2024-10-13 NOTE — Telephone Encounter (Signed)
 PAP: Application for Marcelline Deist has been submitted to [employer] (AZ&Me), via fax

## 2024-10-18 NOTE — Telephone Encounter (Signed)
 PAP: Patient assistance application for Farxiga  has been approved by PAP Companies: [employer] from 11/25/2024 to 11/24/2025. Medication should be delivered to PAP Delivery: Home. For further shipping updates, please contact [employer] (AZ&Me) at [phone]. Patient ID is: [id number]

## 2024-10-20 ENCOUNTER — Other Ambulatory Visit: Payer: Self-pay | Admitting: Family Medicine

## 2024-10-20 ENCOUNTER — Ambulatory Visit (INDEPENDENT_AMBULATORY_CARE_PROVIDER_SITE_OTHER): Admitting: *Deleted

## 2024-10-20 VITALS — Ht 71.0 in | Wt 206.0 lb

## 2024-10-20 DIAGNOSIS — Z0000 Encounter for general adult medical examination without abnormal findings: Secondary | ICD-10-CM

## 2024-10-20 NOTE — Patient Instructions (Signed)
 [name],  Thank you for taking the time for your Medicare Wellness Visit. I appreciate your continued commitment to your health goals. Please review the care plan we discussed, and feel free to reach out if I can assist you further.  Please note that Annual Wellness Visits do not include a physical exam. Some assessments may be limited, especially if the visit was conducted virtually. If needed, we may recommend an in-person follow-up with your provider.  Ongoing Care Seeing your primary care provider every 3 to 6 months helps us  monitor your health and provide consistent, personalized care.   Referrals If a referral was made during today's visit and you haven't received any updates within two weeks, please contact the referred provider directly to check on the status.  Recommended Screenings:  Health Maintenance  Topic Date Due   Yearly kidney health urinalysis for diabetes  08/28/2023   Complete foot exam   09/21/2024   Hemoglobin A1C  09/23/2024   Colon Cancer Screening  12/21/2024*   Eye exam for diabetics  02/03/2025   Yearly kidney function blood test for diabetes  08/04/2025   Medicare Annual Wellness Visit  10/20/2025   DTaP/Tdap/Td vaccine (2 - Td or Tdap) 01/14/2026   Pneumococcal Vaccine for age over 28  Completed   Flu Shot  Completed   Hepatitis C Screening  Completed   Meningitis B Vaccine  Aged Out   COVID-19 Vaccine  Discontinued   Zoster (Shingles) Vaccine  Discontinued  *Topic was postponed. The date shown is not the original due date.   [name] , Thank you for taking time to come for your Medicare Wellness Visit. I appreciate your ongoing commitment to your health goals. Please review the following plan we discussed and let me know if I can assist you in the future.   Screening recommendations/referrals: Colonoscopy:  Recommended yearly ophthalmology/optometry visit for glaucoma screening and checkup Recommended yearly dental visit for hygiene and  checkup  Vaccinations: Influenza vaccine:  Pneumococcal vaccine:  Tdap vaccine:  Shingles vaccine:       Preventive Care 65 Years and Older, Male Preventive care refers to lifestyle choices and visits with your health care provider that can promote health and wellness. What does preventive care include? A yearly physical exam. This is also called an annual well check. Dental exams once or twice a year. Routine eye exams. Ask your health care provider how often you should have your eyes checked. Personal lifestyle choices, including: Daily care of your teeth and gums. Regular physical activity. Eating a healthy diet. Avoiding tobacco and drug use. Limiting alcohol use. Practicing safe sex. Taking low doses of aspirin every day. Taking vitamin and mineral supplements as recommended by your health care provider. What happens during an annual well check? The services and screenings done by your health care provider during your annual well check will depend on your age, overall health, lifestyle risk factors, and family history of disease. Counseling  Your health care provider may ask you questions about your: Alcohol use. Tobacco use. Drug use. Emotional well-being. Home and relationship well-being. Sexual activity. Eating habits. History of falls. Memory and ability to understand (cognition). Work and work astronomer. Screening  You may have the following tests or measurements: Height, weight, and BMI. Blood pressure. Lipid and cholesterol levels. These may be checked every 5 years, or more frequently if you are over 10 years old. Skin check. Lung cancer screening. You may have this screening every year starting at age 79  if you have a 30-pack-year history of smoking and currently smoke or have quit within the past 15 years. Fecal occult blood test (FOBT) of the stool. You may have this test every year starting at age 46. Flexible sigmoidoscopy or colonoscopy. You may  have a sigmoidoscopy every 5 years or a colonoscopy every 10 years starting at age 70. Prostate cancer screening. Recommendations will vary depending on your family history and other risks. Hepatitis C blood test. Hepatitis B blood test. Sexually transmitted disease (STD) testing. Diabetes screening. This is done by checking your blood sugar (glucose) after you have not eaten for a while (fasting). You may have this done every 1-3 years. Abdominal aortic aneurysm (AAA) screening. You may need this if you are a current or former smoker. Osteoporosis. You may be screened starting at age 71 if you are at high risk. Talk with your health care provider about your test results, treatment options, and if necessary, the need for more tests. Vaccines  Your health care provider may recommend certain vaccines, such as: Influenza vaccine. This is recommended every year. Tetanus, diphtheria, and acellular pertussis (Tdap, Td) vaccine. You may need a Td booster every 10 years. Zoster vaccine. You may need this after age 31. Pneumococcal 13-valent conjugate (PCV13) vaccine. One dose is recommended after age 61. Pneumococcal polysaccharide (PPSV23) vaccine. One dose is recommended after age 69. Talk to your health care provider about which screenings and vaccines you need and how often you need them. This information is not intended to replace advice given to you by your health care provider. Make sure you discuss any questions you have with your health care provider. Document Released: 12/08/2015 Document Revised: 07/31/2016 Document Reviewed: 09/12/2015 Elsevier Interactive Patient Education  2017 [employer].  Fall Prevention in the Home Falls can cause injuries. They can happen to people of all ages. There are many things you can do to make your home safe and to help prevent falls. What can I do on the outside of my home? Regularly fix the edges of walkways and driveways and fix any cracks. Remove  anything that might make you trip as you walk through a door, such as a raised step or threshold. Trim any bushes or trees on the path to your home. Use bright outdoor lighting. Clear any walking paths of anything that might make someone trip, such as rocks or tools. Regularly check to see if handrails are loose or broken. Make sure that both sides of any steps have handrails. Any raised decks and porches should have guardrails on the edges. Have any leaves, snow, or ice cleared regularly. Use sand or salt on walking paths during winter. Clean up any spills in your garage right away. This includes oil or grease spills. What can I do in the bathroom? Use night lights. Install grab bars by the toilet and in the tub and shower. Do not use towel bars as grab bars. Use non-skid mats or decals in the tub or shower. If you need to sit down in the shower, use a plastic, non-slip stool. Keep the floor dry. Clean up any water that spills on the floor as soon as it happens. Remove soap buildup in the tub or shower regularly. Attach bath mats securely with double-sided non-slip rug tape. Do not have throw rugs and other things on the floor that can make you trip. What can I do in the bedroom? Use night lights. Make sure that you have a light by your bed that  is easy to reach. Do not use any sheets or blankets that are too big for your bed. They should not hang down onto the floor. Have a firm chair that has side arms. You can use this for support while you get dressed. Do not have throw rugs and other things on the floor that can make you trip. What can I do in the kitchen? Clean up any spills right away. Avoid walking on wet floors. Keep items that you use a lot in easy-to-reach places. If you need to reach something above you, use a strong step stool that has a grab bar. Keep electrical cords out of the way. Do not use floor polish or wax that makes floors slippery. If you must use wax, use  non-skid floor wax. Do not have throw rugs and other things on the floor that can make you trip. What can I do with my stairs? Do not leave any items on the stairs. Make sure that there are handrails on both sides of the stairs and use them. Fix handrails that are broken or loose. Make sure that handrails are as long as the stairways. Check any carpeting to make sure that it is firmly attached to the stairs. Fix any carpet that is loose or worn. Avoid having throw rugs at the top or bottom of the stairs. If you do have throw rugs, attach them to the floor with carpet tape. Make sure that you have a light switch at the top of the stairs and the bottom of the stairs. If you do not have them, ask someone to add them for you. What else can I do to help prevent falls? Wear shoes that: Do not have high heels. Have rubber bottoms. Are comfortable and fit you well. Are closed at the toe. Do not wear sandals. If you use a stepladder: Make sure that it is fully opened. Do not climb a closed stepladder. Make sure that both sides of the stepladder are locked into place. Ask someone to hold it for you, if possible. Clearly mark and make sure that you can see: Any grab bars or handrails. First and last steps. Where the edge of each step is. Use tools that help you move around (mobility aids) if they are needed. These include: Canes. Walkers. Scooters. Crutches. Turn on the lights when you go into a dark area. Replace any light bulbs as soon as they burn out. Set up your furniture so you have a clear path. Avoid moving your furniture around. If any of your floors are uneven, fix them. If there are any pets around you, be aware of where they are. Review your medicines with your doctor. Some medicines can make you feel dizzy. This can increase your chance of falling. Ask your doctor what other things that you can do to help prevent falls. This information is not intended to replace advice given to  you by your health care provider. Make sure you discuss any questions you have with your health care provider. Document Released: 09/07/2009 Document Revised: 04/18/2016 Document Reviewed: 12/16/2014 Elsevier Interactive Patient Education  2017 [employer]       10/20/2024    1:16 PM  Advanced Directives  Does Patient Have a Medical Advance Directive? No  Would patient like information on creating a medical advance directive? No - Patient declined    Vision: Annual vision screenings are recommended for early detection of glaucoma, cataracts, and diabetic retinopathy. These exams can also reveal signs of chronic  conditions such as diabetes and high blood pressure.  Dental: Annual dental screenings help detect early signs of oral cancer, gum disease, and other conditions linked to overall health, including heart disease and diabetes.  Please see the attached documents for additional preventive care recommendations.

## 2024-10-20 NOTE — Progress Notes (Addendum)
 Chief Complaint  Patient presents with   Medicare Wellness     Subjective:   [name] is a 78 y.o. male who presents for a Medicare Annual Wellness Visit.  Medicare Wellness Visit Mode:: telephone II connected with the patient using  telephone enabled telemedicine application and verified that I am speaking with the correct person using two identifiers; I discussed the limitations of evaluation and management by telemedicine; The patient expressed understanding and agreed to proceed    Allergies (verified) Altace [ramipril], Latex, Losartan , Remeron  [mirtazapine ], Tape, Zantac [ranitidine hcl], and Codeine   History: Past Medical History:  Diagnosis Date   Achilles tendinitis of left lower extremity 07/20/2018   Adenomatous colon polyp 2011   Barrett esophagus 12/2013   EGD   CAD (coronary artery disease)    ETT/Lexiscan -Myoview  (11/14):  Normal; no ischemia, EF 67%   CKD (chronic kidney disease), stage III (HCC)    Colon polyp 09/2010   adenoma   COPD (chronic obstructive pulmonary disease) (HCC) 2009   spirometry with moderate COPD   Depression    Diabetes mellitus without complication (HCC)    Dyspnea    GERD (gastroesophageal reflux disease)    GI bleed    after polypectomy/admitted   Hair loss 08/07/2018   HTN (hypertension)    Hypercholesterolemia    Hypomagnesemia 09/15/2022   Memory loss    Myocardial infarction (HCC)    Obesity    Pneumonia 09/15/2022   Sleep apnea    Spontaneous pneumothorax    Synovial cyst of right popliteal space 07/20/2018   Vertigo    Past Surgical History:  Procedure Laterality Date   APPENDECTOMY     CORONARY ANGIOPLASTY WITH STENT PLACEMENT  2007   LAD   IR ANGIOGRAM PELVIS SELECTIVE OR SUPRASELECTIVE  12/24/2023   IR ANGIOGRAM SELECTIVE EACH ADDITIONAL VESSEL  12/24/2023   IR ANGIOGRAM SELECTIVE EACH ADDITIONAL VESSEL  12/24/2023   IR EMBO ARTERIAL NOT HEMORR HEMANG INC GUIDE ROADMAPPING  12/24/2023   IR RADIOLOGIST  EVAL & MGMT  11/24/2023   IR US  GUIDE VASC ACCESS LEFT  12/24/2023   IR US  GUIDE VASC ACCESS RIGHT  12/24/2023   PLEURAL SCARIFICATION  1987   TONSILLECTOMY     VARICOCELECTOMY     Family History  Problem Relation Age of Onset   Heart disease Mother    CVA Mother    Dementia Mother    Coronary artery disease Father 80   Heart disease Father    Stroke Father    Dementia Sister    Cancer - Other Brother    Hypertension Other    Hyperlipidemia Other    Colon polyps Sister    Cancer Sister    Social History   Occupational History   Occupation: retired chartered certified accountant  Tobacco Use   Smoking status: Former    Current packs/day: 0.00    Types: Cigarettes    Quit date: 11/25/1998    Years since quitting: 25.9   Smokeless tobacco: Never  Vaping Use   Vaping status: Never Used  Substance and Sexual Activity   Alcohol use: No   Drug use: No   Sexual activity: Not Currently   Tobacco Counseling Counseling given: Not Answered  SDOH Screenings   Food Insecurity: No Food Insecurity (10/20/2024)  Housing: Unknown (10/20/2024)  Transportation Needs: No Transportation Needs (10/20/2024)  Utilities: Not At Risk (10/20/2024)  Alcohol Screen: Low Risk  (12/19/2023)  Depression (PHQ2-9): Low Risk  (10/20/2024)  Financial Resource Strain: Low  Risk  (12/19/2023)  Physical Activity: Inactive (10/20/2024)  Social Connections: Moderately Isolated (10/20/2024)  Stress: Stress Concern Present (10/20/2024)  Tobacco Use: Medium Risk (10/20/2024)  Health Literacy: Adequate Health Literacy (10/20/2024)   See flowsheets for full screening details  Depression Screen PHQ 2 & 9 Depression Scale- Over the past 2 weeks, how often have you been bothered by any of the following problems? Little interest or pleasure in doing things: 0 Feeling down, depressed, or hopeless (PHQ Adolescent also includes...irritable): 0 PHQ-2 Total Score: 0 Trouble falling or staying asleep, or sleeping too much: 2 Feeling  tired or having little energy: 0 Poor appetite or overeating (PHQ Adolescent also includes...weight loss): 0 Feeling bad about yourself - or that you are a failure or have let yourself or your family down: 0 Trouble concentrating on things, such as reading the newspaper or watching television (PHQ Adolescent also includes...like school work): 0 Moving or speaking so slowly that other people could have noticed. Or the opposite - being so fidgety or restless that you have been moving around a lot more than usual: 0 Thoughts that you would be better off dead, or of hurting yourself in some way: 0 PHQ-9 Total Score: 2 If you checked off any problems, how difficult have these problems made it for you to do your work, take care of things at home, or get along with other people?: Not difficult at all     Goals Addressed             This Visit's Progress    Weight (lb) < 200 lb (90.7 kg)   206 lb (93.4 kg)      Visit info / Clinical Intake: Medicare Wellness Visit Type:: Subsequent Annual Wellness Visit Persons participating in visit:: patient Medicare Wellness Visit Mode:: Telephone If telephone:: video declined Because this visit was a virtual/telehealth visit:: unable to obtan vitals due to lack of equipment If Telephone or Video please confirm:: I connected with the patient using audio enabled telemedicine application and verified that I am speaking with the correct person using two identifiers; I discussed the limitations of evaluation and management by telemedicine; The patient expressed understanding and agreed to proceed Patient Location:: home Provider Location:: home Information given by:: patient Interpreter Needed?: No Pre-visit prep was completed: no AWV questionnaire completed by patient prior to visit?: no Living arrangements:: lives with spouse/significant other Patient's Overall Health Status Rating: good Typical amount of pain: none Does pain affect daily life?: no Are  you currently prescribed opioids?: no  Dietary Habits and Nutritional Risks How many meals a day?: 2 Eats fruit and vegetables daily?: yes Most meals are obtained by: preparing own meals In the last 2 weeks, have you had any of the following?: none Diabetic:: (!) yes Any non-healing wounds?: no How often do you check your BS?: 1 Would you like to be referred to a Nutritionist or for Diabetic Management? : no  Functional Status Activities of Daily Living (to include ambulation/medication): Independent Ambulation: Independent Medication Administration: Independent Home Management: Independent Manage your own finances?: yes Primary transportation is: driving Concerns about vision?: no *vision screening is required for WTM* Concerns about hearing?: (!) yes Uses hearing aids?: (!) yes Hear whispered voice?: (!) no *in-person visit only*  Fall Screening Falls in the past year?: 0 Number of falls in past year: 0 Was there an injury with Fall?: 0 Fall Risk Category Calculator: 0 Patient Fall Risk Level: Low Fall Risk  Fall Risk Patient at Risk for Falls  Due to: No Fall Risks Fall risk Follow up: Falls evaluation completed; Education provided; Falls prevention discussed  Home and Transportation Safety: All rugs have non-skid backing?: yes All stairs or steps have railings?: yes Grab bars in the bathtub or shower?: yes Have non-skid surface in bathtub or shower?: yes Good home lighting?: yes Regular seat belt use?: yes Hospital stays in the last year:: no  Cognitive Assessment Difficulty concentrating, remembering, or making decisions? : yes Will 6CIT or Mini Cog be Completed: yes What year is it?: 0 points What month is it?: 0 points Give patient an address phrase to remember (5 components): Its very sunny outside today in November About what time is it?: 0 points Count backwards from 20 to 1: 0 points Say the months of the year in reverse: 0 points Repeat the address  phrase from earlier: 0 points 6 CIT Score: 0 points  Advance Directives (For Healthcare) Does Patient Have a Medical Advance Directive?: No Would patient like information on creating a medical advance directive?: No - Patient declined  Reviewed/Updated  Reviewed/Updated: Surgical History; Reviewed All (Medical, Surgical, Family, Medications, Allergies, Care Teams, Patient Goals); Family History; Medications; Allergies; Care Teams; Patient Goals; Medical History        Objective:    Today's Vitals   10/20/24 1313  Weight: 206 lb (93.4 kg)  Height: 5' 11 (1.803 m)   Body mass index is 28.73 kg/m.  Current Medications (verified) Outpatient Encounter Medications as of 10/20/2024  Medication Sig   amLODipine  (NORVASC ) 5 MG tablet Take 1 tablet (5 mg total) by mouth daily.   atorvastatin  (LIPITOR) 80 MG tablet Take 1 tablet (80 mg total) by mouth daily.   Baclofen  5 MG TABS Take 1 tablet (5 mg total) by mouth every 8 (eight) hours.   blood glucose meter kit and supplies Dispense based on patient and insurance preference. Use up to three times daily as directed. (FOR ICD-10 E10.9, E11.9).   buPROPion  (WELLBUTRIN  SR) 150 MG 12 hr tablet TAKE 1 TABLET BY MOUTH EVERY DAY   clopidogrel  (PLAVIX ) 75 MG tablet TAKE 1 TABLET BY MOUTH EVERY DAY   Cyanocobalamin  (VITAMIN B 12 PO) Take by mouth.   dapagliflozin  propanediol (FARXIGA ) 10 MG TABS tablet Take 1 tablet (10 mg total) by mouth daily before breakfast.   donepezil  (ARICEPT  ODT) 5 MG disintegrating tablet TAKE 1 TABLET BY MOUTH EVERYDAY AT BEDTIME   famotidine  (PEPCID ) 20 MG tablet Take 1 tablet (20 mg total) by mouth 2 (two) times daily.   finasteride (PROSCAR) 5 MG tablet Take 5 mg by mouth daily.   glipiZIDE  (GLUCOTROL ) 10 MG tablet TAKE 1 TABLET (10 MG TOTAL) BY MOUTH TWICE A DAY BEFORE A MEAL   glucose blood (ACCU-CHEK GUIDE) test strip 1 each by Other route daily.   lubiprostone  (AMITIZA ) 8 MCG capsule TAKE 1 CAPSULE (8 MCG TOTAL)  BY MOUTH 2 (TWO) TIMES DAILY WITH A MEAL.   MAG64 64 MG TBEC TAKE 1 TABLET (64 MG TOTAL) BY MOUTH 2 (TWO) TIMES DAILY.   metoprolol  tartrate (LOPRESSOR ) 50 MG tablet Take 1 tablet (50 mg total) by mouth daily.   mirabegron  ER (MYRBETRIQ ) 50 MG TB24 tablet Take 1 tablet (50 mg total) by mouth daily.   MOUNJARO  7.5 MG/0.5ML Pen Inject 7.5 mg into the skin once a week.   nitroGLYCERIN  (NITROSTAT ) 0.4 MG SL tablet Place 1 tablet (0.4 mg total) under the tongue every 5 (five) minutes as needed for chest pain (3 doses max).  pantoprazole  (PROTONIX ) 40 MG tablet TAKE 1 TABLET BY MOUTH TWICE A DAY   PARoxetine  (PAXIL ) 40 MG tablet Take 1 tablet (40 mg total) by mouth daily.   Polysaccharide Iron  Complex 434.8 (200 Fe) MG CAPS Take 1 capsule by mouth daily.   QUEtiapine  (SEROQUEL ) 100 MG tablet 1/2 tab in the morning, 1.5 tab before bed   SYMBICORT  160-4.5 MCG/ACT inhaler Inhale 2 puffs into the lungs 2 (two) times daily.   tamsulosin (FLOMAX) 0.4 MG CAPS capsule    [DISCONTINUED] buPROPion  (WELLBUTRIN  SR) 150 MG 12 hr tablet Take 1 tablet (150 mg total) by mouth daily.   No facility-administered encounter medications on file as of 10/20/2024.   Hearing/Vision screen Hearing Screening - Comments:: Some trouble hearing Has hearing aids Vision Screening - Comments:: Up to date [facility] Immunizations and Health Maintenance Health Maintenance  Topic Date Due   Diabetic kidney evaluation - Urine ACR  08/28/2023   FOOT EXAM  09/21/2024   HEMOGLOBIN A1C  09/23/2024   Colonoscopy  12/21/2024 (Originally 04/02/2021)   OPHTHALMOLOGY EXAM  02/03/2025   Diabetic kidney evaluation - eGFR measurement  08/04/2025   Medicare Annual Wellness (AWV)  10/20/2025   DTaP/Tdap/Td (2 - Td or Tdap) 01/14/2026   Pneumococcal Vaccine: 50+ Years  Completed   Influenza Vaccine  Completed   Hepatitis C Screening  Completed   Meningococcal B Vaccine  Aged Out   COVID-19 Vaccine  Discontinued   Zoster Vaccines-  Shingrix   Discontinued        Assessment/Plan:  This is a routine wellness examination for Brody.  Patient Care Team: [name] LABOR, DO as PCP - General (Family Medicine) [name], MD as PCP - Cardiology (Cardiology) [name], MD as Consulting Physician (Hematology) [name], MD as Consulting Physician (Cardiology) [name], MD as Consulting Physician (Gastroenterology) Hollar, [name], MD as Referring Physician (Dermatology) [name], OD as Referring Physician [name], MD as Consulting Physician (Urology)  I have personally reviewed and noted the following in the patient's chart:   Medical and social history Use of alcohol, tobacco or illicit drugs  Current medications and supplements including opioid prescriptions. Functional ability and status Nutritional status Physical activity Advanced directives List of other physicians Hospitalizations, surgeries, and ER visits in previous 12 months Vitals Screenings to include cognitive, depression, and falls Referrals and appointments  No orders of the defined types were placed in this encounter.  In addition, I have reviewed and discussed with patient certain preventive protocols, quality metrics, and best practice recommendations. A written personalized care plan for preventive services as well as general preventive health recommendations were provided to patient.   Mliss Graff, LPN   [id number]   Return in 1 year (on 10/20/2025).  After Visit Summary: (MyChart) Due to this being a telephonic visit, the after visit summary with patients personalized plan was offered to patient via MyChart   Nurse Notes:

## 2024-10-25 ENCOUNTER — Other Ambulatory Visit: Payer: Self-pay | Admitting: Family Medicine

## 2024-10-29 ENCOUNTER — Other Ambulatory Visit: Payer: Self-pay | Admitting: Family Medicine

## 2024-10-29 NOTE — Telephone Encounter (Signed)
 Copied from CRM [id number]. Topic: Clinical - Medication Refill >> Oct 29, 2024  3:10 PM Ashley R wrote: Medication:  MAG64 64 MG TBEC    Has the patient contacted their pharmacy? Yes  This is the patient's preferred pharmacy:  [id number] - [place], [place] - [address] [place] [address] [place] [address] [place] [address] Phone: [phone] Fax: [phone]   Is this the correct pharmacy for this prescription? Yes If no, delete pharmacy and type the correct one.   Has the prescription been filled recently? Yes  Is the patient out of the medication? Yes  Has the patient been seen for an appointment in the last year OR does the patient have an upcoming appointment? Yes  Can we respond through MyChart? Yes  Agent: Please be advised that Rx refills may take up to 3 business days. We ask that you follow-up with your pharmacy.

## 2024-11-05 ENCOUNTER — Encounter: Payer: Self-pay | Admitting: Cardiovascular Disease

## 2024-11-09 NOTE — Telephone Encounter (Signed)
 PAP: Patient assistance application for Farxiga  has been approved by PAP Companies: [employer] from 11/25/2024 to 11/24/2025. Medication should be delivered to PAP Delivery: Home. For further shipping updates, please contact [employer] (AZ&Me) at [phone]. Patient ID is: [id number]

## 2024-11-16 ENCOUNTER — Telehealth: Payer: Self-pay | Admitting: Pharmacist

## 2024-11-16 ENCOUNTER — Other Ambulatory Visit: Payer: Self-pay | Admitting: Pharmacist

## 2024-11-16 NOTE — Telephone Encounter (Signed)
 Patient needs update Rx for Farxiga  sent to [facility] / patient assistance program. Will send to PCP's pool since it looks like he might be due to be seen by PCP.

## 2024-11-16 NOTE — Progress Notes (Addendum)
 Pharmacy Quality Measure Review  This patient is appearing on a report for being at risk of failing the adherence measure for cholesterol (statin) and diabetes medications this calendar year.   Medication: Mounjaro  15mg  per adherence report but per patient records his is taking Mounjaro  7.5mg  weeky  Last fill date: 08/16/2024 for 28 day supply  Reviewed recent refill history in Dr Annemarie database. Actual last refill date was 10/11/2024 for 28 day supply. Patient has no refills remaining. Next appointment with PCP is not currently scheduled.    There was mention of spacing out Mounjaro  dose to every 14 days after his abdominal CT 08/05/2024.   It looks like Dr Catherine was requesting patient be seen to discuss CT results but no recent appointment.  Patient does need updated Rx for Farxiga  - gets them [facility] / [place] program and possibly Mounjaro  but not sure how he is currently taking Mounjaro .   Left voicemail for patient to return my call at their convenience. Will send request for Farxiga  to PCP's refill like with message about follow up visit.   Madelin Ray, PharmD Clinical Pharmacist [facility] [phone]   11/23/2024 - spoke with patient's wife. Appointment made to see Dr Catherine 12/08/2024 at 10am.  Mrs. Morandi asked about magnesium  refill. It looks like it was denied due to not strength / dosing instructions. Updated dosing - patient has been taking 1 tablet daily (tried twice a day but has GI upset). Will send message to PCP about refilling.   Madelin Ray, PharmD Clinical Pharmacist [facility]  Population Health [phone]

## 2024-11-17 MED ORDER — DAPAGLIFLOZIN PROPANEDIOL 10 MG PO TABS: 10.0000 mg | ORAL_TABLET | Freq: Every day | ORAL | 0 refills | Status: DC

## 2024-11-23 ENCOUNTER — Telehealth: Payer: Self-pay | Admitting: Pharmacist

## 2024-11-23 MED ORDER — MAG64 64 MG PO TBEC: 1.0000 | DELAYED_RELEASE_TABLET | Freq: Every day | ORAL | 3 refills | Status: AC

## 2024-11-23 NOTE — Telephone Encounter (Signed)
 Patient's wife returned call to set up appointment with PCP for follow up. Appt set for 12/08/2024 at 10am.   Mrs. Cozort also asked about refill for his Magnesium  supplement. [facility] - denial reason was no dose route or frequency records.   Patient has been taking magnesium  chloride 64 - takes 1 tablet daily (1 tablet = 64mg  of magnesium ). He tried to take 2 tabs per day but had GI side effects so dropped to 1 tablet daily.  Will forward to PCP's clinical pool for review and approval if appropriate.

## 2024-12-06 ENCOUNTER — Other Ambulatory Visit: Payer: Self-pay | Admitting: Family Medicine

## 2024-12-06 NOTE — Telephone Encounter (Signed)
 Communication  Reason for CRM: Pt spouse called in wanting to know if pt can go without his medication MOUNJARO  7.5 MG/0.5ML Pen until its filled. Per pt chart medication refill request was just put in today, spouse has been advised of turnaround time for refills. Pt spouse is concerned and is requesting a callback.   Rx refilled. MyChart sent.

## 2024-12-08 ENCOUNTER — Ambulatory Visit: Admitting: Family Medicine

## 2024-12-08 VITALS — BP 134/70 | HR 85 | Temp 98.2°F | Wt 200.8 lb

## 2024-12-08 DIAGNOSIS — I679 Cerebrovascular disease, unspecified: Secondary | ICD-10-CM

## 2024-12-08 DIAGNOSIS — E785 Hyperlipidemia, unspecified: Secondary | ICD-10-CM | POA: Diagnosis not present

## 2024-12-08 DIAGNOSIS — R140 Abdominal distension (gaseous): Secondary | ICD-10-CM

## 2024-12-08 DIAGNOSIS — I252 Old myocardial infarction: Secondary | ICD-10-CM

## 2024-12-08 DIAGNOSIS — Z5181 Encounter for therapeutic drug level monitoring: Secondary | ICD-10-CM | POA: Diagnosis not present

## 2024-12-08 DIAGNOSIS — R066 Hiccough: Secondary | ICD-10-CM

## 2024-12-08 DIAGNOSIS — I2510 Atherosclerotic heart disease of native coronary artery without angina pectoris: Secondary | ICD-10-CM

## 2024-12-08 DIAGNOSIS — F332 Major depressive disorder, recurrent severe without psychotic features: Secondary | ICD-10-CM | POA: Diagnosis not present

## 2024-12-08 DIAGNOSIS — F5101 Primary insomnia: Secondary | ICD-10-CM

## 2024-12-08 DIAGNOSIS — K2100 Gastro-esophageal reflux disease with esophagitis, without bleeding: Secondary | ICD-10-CM | POA: Diagnosis not present

## 2024-12-08 DIAGNOSIS — K2270 Barrett's esophagus without dysplasia: Secondary | ICD-10-CM

## 2024-12-08 DIAGNOSIS — E1142 Type 2 diabetes mellitus with diabetic polyneuropathy: Secondary | ICD-10-CM | POA: Diagnosis not present

## 2024-12-08 DIAGNOSIS — G319 Degenerative disease of nervous system, unspecified: Secondary | ICD-10-CM

## 2024-12-08 DIAGNOSIS — J410 Simple chronic bronchitis: Secondary | ICD-10-CM | POA: Insufficient documentation

## 2024-12-08 DIAGNOSIS — K5901 Slow transit constipation: Secondary | ICD-10-CM

## 2024-12-08 DIAGNOSIS — R4189 Other symptoms and signs involving cognitive functions and awareness: Secondary | ICD-10-CM

## 2024-12-08 DIAGNOSIS — E1169 Type 2 diabetes mellitus with other specified complication: Secondary | ICD-10-CM | POA: Diagnosis not present

## 2024-12-08 DIAGNOSIS — D6869 Other thrombophilia: Secondary | ICD-10-CM

## 2024-12-08 DIAGNOSIS — Z79899 Other long term (current) drug therapy: Secondary | ICD-10-CM

## 2024-12-08 DIAGNOSIS — I6782 Cerebral ischemia: Secondary | ICD-10-CM | POA: Diagnosis not present

## 2024-12-08 DIAGNOSIS — R1084 Generalized abdominal pain: Secondary | ICD-10-CM

## 2024-12-08 DIAGNOSIS — G629 Polyneuropathy, unspecified: Secondary | ICD-10-CM | POA: Insufficient documentation

## 2024-12-08 DIAGNOSIS — N1831 Chronic kidney disease, stage 3a: Secondary | ICD-10-CM | POA: Insufficient documentation

## 2024-12-08 DIAGNOSIS — I10 Essential (primary) hypertension: Secondary | ICD-10-CM

## 2024-12-08 DIAGNOSIS — N138 Other obstructive and reflux uropathy: Secondary | ICD-10-CM

## 2024-12-08 DIAGNOSIS — D500 Iron deficiency anemia secondary to blood loss (chronic): Secondary | ICD-10-CM

## 2024-12-08 LAB — MAGNESIUM: Magnesium: 1.7 mg/dL (ref 1.5–2.5)

## 2024-12-08 LAB — COMPREHENSIVE METABOLIC PANEL WITH GFR
ALT: 28 U/L (ref 3–53)
AST: 17 U/L (ref 5–37)
Albumin: 4.1 g/dL (ref 3.5–5.2)
Alkaline Phosphatase: 84 U/L (ref 39–117)
BUN: 19 mg/dL (ref 6–23)
CO2: 27 meq/L (ref 19–32)
Calcium: 9.5 mg/dL (ref 8.4–10.5)
Chloride: 101 meq/L (ref 96–112)
Creatinine, Ser: 1.33 mg/dL (ref 0.40–1.50)
GFR: 51.69 mL/min — ABNORMAL LOW
Glucose, Bld: 304 mg/dL — ABNORMAL HIGH (ref 70–99)
Potassium: 4.5 meq/L (ref 3.5–5.1)
Sodium: 136 meq/L (ref 135–145)
Total Bilirubin: 0.5 mg/dL (ref 0.2–1.2)
Total Protein: 6.9 g/dL (ref 6.0–8.3)

## 2024-12-08 LAB — MICROALBUMIN / CREATININE URINE RATIO
Creatinine,U: 43.5 mg/dL
Microalb Creat Ratio: UNDETERMINED mg/g (ref 0.0–30.0)
Microalb, Ur: <0.7 mg/dL

## 2024-12-08 LAB — CBC
HCT: 35.7 % — ABNORMAL LOW (ref 39.0–52.0)
Hemoglobin: 11.9 g/dL — ABNORMAL LOW (ref 13.0–17.0)
MCHC: 33.4 g/dL (ref 30.0–36.0)
MCV: 79.2 fl (ref 78.0–100.0)
Platelets: 228 K/uL (ref 150.0–400.0)
RBC: 4.51 Mil/uL (ref 4.22–5.81)
RDW: 17.8 % — ABNORMAL HIGH (ref 11.5–15.5)
WBC: 8.3 K/uL (ref 4.0–10.5)

## 2024-12-08 LAB — B12 AND FOLATE PANEL
Folate: 17.4 ng/mL
Vitamin B-12: 1063 pg/mL — ABNORMAL HIGH (ref 211–911)

## 2024-12-08 LAB — TSH: TSH: 1.56 u[IU]/mL (ref 0.35–5.50)

## 2024-12-08 LAB — VITAMIN D 25 HYDROXY (VIT D DEFICIENCY, FRACTURES): VITD: 43.55 ng/mL (ref 30.00–100.00)

## 2024-12-08 LAB — HEMOGLOBIN A1C: Hgb A1c MFr Bld: 9.2 % — ABNORMAL HIGH (ref 4.6–6.5)

## 2024-12-08 MED ORDER — SYMBICORT 160-4.5 MCG/ACT IN AERO: 2.0000 | INHALATION_SPRAY | Freq: Two times a day (BID) | RESPIRATORY_TRACT | 4 refills | Status: AC

## 2024-12-08 MED ORDER — CLOPIDOGREL BISULFATE 75 MG PO TABS: 75.0000 mg | ORAL_TABLET | Freq: Every day | ORAL | 3 refills | Status: AC

## 2024-12-08 MED ORDER — QUETIAPINE FUMARATE 100 MG PO TABS: ORAL_TABLET | ORAL | 1 refills | Status: AC

## 2024-12-08 MED ORDER — AMLODIPINE BESYLATE 5 MG PO TABS: 5.0000 mg | ORAL_TABLET | Freq: Every day | ORAL | 1 refills | Status: AC

## 2024-12-08 MED ORDER — FAMOTIDINE 20 MG PO TABS: 20.0000 mg | ORAL_TABLET | Freq: Two times a day (BID) | ORAL | 3 refills | Status: AC

## 2024-12-08 MED ORDER — DAPAGLIFLOZIN PROPANEDIOL 10 MG PO TABS: 10.0000 mg | ORAL_TABLET | Freq: Every day | ORAL | 5 refills | Status: DC

## 2024-12-08 MED ORDER — PANTOPRAZOLE SODIUM 40 MG PO TBEC: 40.0000 mg | DELAYED_RELEASE_TABLET | Freq: Two times a day (BID) | ORAL | 1 refills | Status: AC

## 2024-12-08 MED ORDER — BUPROPION HCL ER (SR) 150 MG PO TB12: 150.0000 mg | ORAL_TABLET | Freq: Every day | ORAL | 1 refills | Status: AC

## 2024-12-08 MED ORDER — POLYSACCHARIDE IRON COMPLEX 434.8 (200 FE) MG PO CAPS: 1.0000 | ORAL_CAPSULE | Freq: Every day | ORAL | 5 refills | Status: AC

## 2024-12-08 MED ORDER — LUBIPROSTONE 8 MCG PO CAPS: 8.0000 ug | ORAL_CAPSULE | Freq: Two times a day (BID) | ORAL | 1 refills | Status: AC

## 2024-12-08 MED ORDER — DAPAGLIFLOZIN PROPANEDIOL 10 MG PO TABS: 10.0000 mg | ORAL_TABLET | Freq: Every day | ORAL | 5 refills | Status: AC

## 2024-12-08 MED ORDER — MIRABEGRON ER 50 MG PO TB24: 50.0000 mg | ORAL_TABLET | Freq: Every day | ORAL | 1 refills | Status: AC

## 2024-12-08 MED ORDER — BACLOFEN 5 MG PO TABS: 5.0000 mg | ORAL_TABLET | Freq: Three times a day (TID) | ORAL | 1 refills | Status: AC

## 2024-12-08 MED ORDER — DONEPEZIL HCL 5 MG PO TBDP: 5.0000 mg | ORAL_TABLET | Freq: Every day | ORAL | 1 refills | Status: AC

## 2024-12-08 MED ORDER — ATORVASTATIN CALCIUM 80 MG PO TABS: 80.0000 mg | ORAL_TABLET | Freq: Every day | ORAL | 3 refills | Status: AC

## 2024-12-08 MED ORDER — METOPROLOL TARTRATE 50 MG PO TABS: 50.0000 mg | ORAL_TABLET | Freq: Every day | ORAL | 1 refills | Status: AC

## 2024-12-08 MED ORDER — PAROXETINE HCL 40 MG PO TABS: 40.0000 mg | ORAL_TABLET | Freq: Every day | ORAL | 1 refills | Status: AC

## 2024-12-08 NOTE — Progress Notes (Signed)
 "     [name] , Jul 28, 1946, 79 y.o., male MRN: [id number] Patient Care Team    Relationship Specialty Notifications Start End  [name] LABOR, DO PCP - General Family Medicine  12/25/15   Todd Maude BROCKS, MD PCP - Cardiology Cardiology Admissions 08/20/18   Todd Emaline Brink, MD Consulting Physician Hematology  09/03/16   Todd Maude BROCKS, MD Consulting Physician Cardiology  09/03/16   Todd Specking, MD Consulting Physician Gastroenterology  09/03/16   [name], Todd Greener, MD Referring Physician Dermatology  06/29/19   [name], OD Referring Physician   01/31/23   Todd Sherwood JONETTA DOUGLAS, MD Consulting Physician Urology  02/24/24     Chief Complaint  Patient presents with   Diabetes    Pt is not fasting. Pt has not taken medication today.      Subjective: Pt presents for an OV for follow-up  Chronic Conditions/illness Management Medication reconciliation completed today Past medical history updated with any changes if appropriate.  Recurrent major depressive disorder, in partial remission (HCC)/memory changes He reports compliance with Seroquel  50/150 mg, Paxil  40 mg daily and Wellbutrin  150 mg 12-hour tablet daily.  Patient reports current regimen is working really well for him, his wife agrees. Zyprexa  discontinued secondary to arthralgias. Prior note: Patient is present with his wife today. They report he is feeling better. He has not started the Flintstone vitamin. He is tolerating the magnesium  supplement.He has more energy and less pain.  He stopped the zyprexa  and started the higher dose trazodone  200 mg QHS. He is still not sleeping great, but ok for him.    Gastroesophageal reflux disease, esophagitis presence not specified/diarrhea/bloating  EGD 03/2016 by Dr. Dianna with signs of barrett's esophagus. Patient was tried on lower dose medication, and was unable to tolerate secondary to return of symptoms.   His symptoms are well-controlled with protonix  40 BID  and pepcid  BID.  Now at digestive health specialist since 2023  COPD [facility]) He uses Symbicort  daily and feels it is working well for him-when needed.  Atherosclerosis of native coronary artery of native heart without angina pectoris/Essential hypertension/HYPERCHOLESTEROLEMIA/History of MI (myocardial infarction)/IDA He has known CAD with prior LAD stent from 2007. Pt reports compliance with metoprolol  50 mg daily and amlodipine  5 mg daily Qd Patient denies chest pain, shortness of breath, dizziness or lower extremity edema.   Pt is compliance with daily weight Plavix . Pt is  prescribed statin and followed by Dr. Delford Cardiology.  Diet: Low-sodium RF: Hypertension, hyperlipidemia, diabetes  Type 2 diabetes mellitus without complication, without long-term current use of insulin (HCC)-neuropathy patient reports compliance Farxiga  10 mg and Mounjaro  7.5 mg q. 14 days due to tolerance  He had diarrhea  off metformin  trial as well.  We discontinued metformin  regardless, to not cause the potential of worsening diarrhea.   Patient denies dizziness, hyperglycemic or hypoglycemic events. Patient endorses numbness on the bottoms of his feet.  Denies tingling in the extremities or nonhealing wounds of feet.      12/08/2024    9:53 AM 10/20/2024    1:19 PM 08/04/2024    2:44 PM 03/24/2024    2:21 PM 12/22/2023    1:54 PM  Depression screen PHQ 2/9  Decreased Interest 0 0 0 2 1  Down, Depressed, Hopeless 0 0 0 1 1  PHQ - 2 Score 0 0 0 3 2  Altered sleeping 0 2 1 2 2   Tired, decreased energy 0 0 1 2 1  Change in appetite 0 0 0 1 1  Feeling bad or failure about yourself  0 0 0 0 0  Trouble concentrating 0 0 1  1  Moving slowly or fidgety/restless 0 0 1 0 0  Suicidal thoughts 0  0 0 0  PHQ-9 Score 0 2 4  8  7    Difficult doing work/chores Not difficult at all Not difficult at all Somewhat difficult  Not difficult at all     Data saved with a previous flowsheet row definition      12/08/2024     9:53 AM 03/24/2024    2:22 PM 12/22/2023    1:55 PM 09/22/2023    2:01 PM  GAD 7 : Generalized Anxiety Score  Nervous, Anxious, on Edge 0 1 0 0  Control/stop worrying 0 0 0 1  Worry too much - different things 0 1 0 1  Trouble relaxing 0 1 0 0  Restless 0 0 0 0  Easily annoyed or irritable 0 1 0 0  Afraid - awful might happen 0 0 0 0  Total GAD 7 Score 0 4 0 2  Anxiety Difficulty Not difficult at all Not difficult at all Very difficult Not difficult at all      12/22/2023    1:54 PM 03/24/2024    2:21 PM 08/04/2024    2:44 PM 10/20/2024    1:16 PM 12/08/2024    9:52 AM  Fall Risk  Falls in the past year? 0 0 0 0 0  Was there an injury with Fall? 0  0  0  0  0  Fall Risk Category Calculator 0 0 0 0 0  Patient at Risk for Falls Due to    No Fall Risks No Fall Risks  Fall risk Follow up   Falls evaluation completed Falls evaluation completed;Education provided;Falls prevention discussed Falls evaluation completed     Data saved with a previous flowsheet row definition    Allergies  Allergen Reactions   Altace [Ramipril] Cough   Latex Hives   Losartan  Cough    cough   Remeron  [Mirtazapine ] Other (See Comments)    Cognitive changes   Tape     Blisters    Zantac [Ranitidine Hcl] Dermatitis   Codeine     Makes sick   Social History   Social History Narrative   Lives at home with wife. Married to [name].   Retired advertising account planner truck.    Drinks caffeine (3 cups coffee, 2 sodas per day),  Takes a daily vitamin, wears a seatbelt   Wears a bicycle helmet.    Has dentures (partial on top)   Smoke detector in the home. No firearms in the home.    Feels safe in relationships.     Right-handed.   Past Medical History:  Diagnosis Date   Abdominal bloating 08/04/2024   Achilles tendinitis of left lower extremity 07/20/2018   Adenomatous colon polyp 2011   Barrett esophagus 12/2013   EGD   CAD (coronary artery disease)    ETT/Lexiscan -Myoview  (11/14):  Normal; no ischemia, EF 67%    CKD (chronic kidney disease), stage III (HCC)    Colon polyp 09/2010   adenoma   COPD (chronic obstructive pulmonary disease) (HCC) 2009   spirometry with moderate COPD   Depression    Diabetes mellitus without complication (HCC)    Dyspnea    GERD (gastroesophageal reflux disease)    GI bleed    after polypectomy/admitted   Hair loss 08/07/2018  HTN (hypertension)    Hypercholesterolemia    Hypomagnesemia 09/15/2022   Memory loss    Myocardial infarction (HCC)    Obesity    Pneumonia 09/15/2022   Sleep apnea    Spontaneous pneumothorax    Synovial cyst of right popliteal space 07/20/2018   Vertigo    Past Surgical History:  Procedure Laterality Date   APPENDECTOMY     CORONARY ANGIOPLASTY WITH STENT PLACEMENT  2007   LAD   IR ANGIOGRAM PELVIS SELECTIVE OR SUPRASELECTIVE  12/24/2023   IR ANGIOGRAM SELECTIVE EACH ADDITIONAL VESSEL  12/24/2023   IR ANGIOGRAM SELECTIVE EACH ADDITIONAL VESSEL  12/24/2023   IR EMBO ARTERIAL NOT HEMORR HEMANG INC GUIDE ROADMAPPING  12/24/2023   IR RADIOLOGIST EVAL & MGMT  11/24/2023   IR US  GUIDE VASC ACCESS LEFT  12/24/2023   IR US  GUIDE VASC ACCESS RIGHT  12/24/2023   PLEURAL SCARIFICATION  1987   TONSILLECTOMY     VARICOCELECTOMY     Family History  Problem Relation Age of Onset   Heart disease Mother    CVA Mother    Dementia Mother    Coronary artery disease Father 76   Heart disease Father    Stroke Father    Dementia Sister    Cancer - Other Brother    Hypertension Other    Hyperlipidemia Other    Colon polyps Sister    Cancer Sister    Allergies as of 12/08/2024       Reactions   Altace [ramipril] Cough   Latex Hives   Losartan  Cough   cough   Remeron  [mirtazapine ] Other (See Comments)   Cognitive changes   Tape    Blisters   Zantac [ranitidine Hcl] Dermatitis   Codeine    Makes sick        Medication List        Accurate as of December 08, 2024  3:54 PM. If you have any questions, ask your nurse or doctor.           STOP taking these medications    Accu-Chek Guide test strip Generic drug: glucose blood Stopped by: [name] Bellini, DO   blood glucose meter kit and supplies Stopped by: [name] Bellini, DO   glipiZIDE  10 MG tablet Commonly known as: GLUCOTROL  Stopped by: [name] Bellini, DO   VITAMIN B 12 PO Stopped by: [name] Bellini, DO       TAKE these medications    amLODipine  5 MG tablet Commonly known as: NORVASC  Take 1 tablet (5 mg total) by mouth daily.   atorvastatin  80 MG tablet Commonly known as: LIPITOR Take 1 tablet (80 mg total) by mouth daily.   Baclofen  5 MG Tabs Take 1 tablet (5 mg total) by mouth every 8 (eight) hours.   buPROPion  150 MG 12 hr tablet Commonly known as: WELLBUTRIN  SR Take 1 tablet (150 mg total) by mouth daily.   clopidogrel  75 MG tablet Commonly known as: PLAVIX  Take 1 tablet (75 mg total) by mouth daily.   dapagliflozin  propanediol 10 MG Tabs tablet Commonly known as: Farxiga  Take 1 tablet (10 mg total) by mouth daily before breakfast. OFFICE VISIT NEEDED FOR FURTHER REFILL.   donepezil  5 MG disintegrating tablet Commonly known as: ARICEPT  ODT Take 1 tablet (5 mg total) by mouth at bedtime. What changed: See the new instructions. Changed by: [name] Bellini, DO   famotidine  20 MG tablet Commonly known as: Pepcid  Take 1 tablet (20 mg total) by mouth 2 (two) times daily.  finasteride 5 MG tablet Commonly known as: PROSCAR Take 5 mg by mouth daily.   lubiprostone  8 MCG capsule Commonly known as: AMITIZA  Take 1 capsule (8 mcg total) by mouth 2 (two) times daily with a meal.   Mag64 64 MG Tbec Generic drug: Magnesium  Chloride Take 1 tablet (64 mg total) by mouth daily.   metoprolol  tartrate 50 MG tablet Commonly known as: LOPRESSOR  Take 1 tablet (50 mg total) by mouth daily.   mirabegron  ER 50 MG Tb24 tablet Commonly known as: Myrbetriq  Take 1 tablet (50 mg total) by mouth daily.   Mounjaro  7.5 MG/0.5ML Pen Generic drug:  tirzepatide  INJECT 7.5 MG SUBCUTANEOUSLY WEEKLY   nitroGLYCERIN  0.4 MG SL tablet Commonly known as: NITROSTAT  Place 1 tablet (0.4 mg total) under the tongue every 5 (five) minutes as needed for chest pain (3 doses max).   pantoprazole  40 MG tablet Commonly known as: PROTONIX  Take 1 tablet (40 mg total) by mouth 2 (two) times daily.   PARoxetine  40 MG tablet Commonly known as: PAXIL  Take 1 tablet (40 mg total) by mouth daily.   Polysaccharide Iron  Complex 434.8 (200 Fe) MG Caps Take 1 capsule by mouth daily.   QUEtiapine  100 MG tablet Commonly known as: SEROquel  1/2 tab in the morning, 1.5 tab before bed   Symbicort  160-4.5 MCG/ACT inhaler Generic drug: budesonide-formoterol Inhale 2 puffs into the lungs 2 (two) times daily.   tamsulosin 0.4 MG Caps capsule Commonly known as: FLOMAX        All past medical history, surgical history, allergies, family history, immunizations andmedications were updated in the EMR today and reviewed under the history and medication portions of their EMR.     Review of Systems  Constitutional: Negative.   HENT: Negative.    Eyes: Negative.   Respiratory: Negative.    Cardiovascular: Negative.   Gastrointestinal: Negative.   Genitourinary: Negative.   Musculoskeletal: Negative.   Skin: Negative.   Neurological:  Positive for sensory change.  Endo/Heme/Allergies: Negative.   Psychiatric/Behavioral: Negative.    All other systems reviewed and are negative.  Negative, with the exception of above mentioned in HPI  Objective:  BP 134/70   Pulse 85   Temp 98.2 F (36.8 C)   Wt 200 lb 12.8 oz (91.1 kg)   SpO2 96%   BMI 28.01 kg/m  Body mass index is 28.01 kg/m. Physical Exam Vitals and nursing note reviewed.  Constitutional:      General: He is not in acute distress.    Appearance: Normal appearance. He is not ill-appearing, toxic-appearing or diaphoretic.  HENT:     Head: Normocephalic and atraumatic.  Eyes:     General: No  scleral icterus.       Right eye: No discharge.        Left eye: No discharge.     Extraocular Movements: Extraocular movements intact.     Pupils: Pupils are equal, round, and reactive to light.  Cardiovascular:     Rate and Rhythm: Normal rate and regular rhythm.  Pulmonary:     Effort: Pulmonary effort is normal. No respiratory distress.     Breath sounds: Normal breath sounds. No wheezing, rhonchi or rales.  Musculoskeletal:     Cervical back: Neck supple.     Right lower leg: No edema.     Left lower leg: No edema.  Lymphadenopathy:     Cervical: No cervical adenopathy.  Skin:    General: Skin is warm and dry.  Coloration: Skin is not jaundiced or pale.     Findings: No rash.  Neurological:     Mental Status: He is alert and oriented to person, place, and time. Mental status is at baseline.  Psychiatric:        Mood and Affect: Mood normal.        Behavior: Behavior normal.        Thought Content: Thought content normal.        Judgment: Judgment normal.    Diabetic Foot Exam - Simple   Simple Foot Form Diabetic Foot exam was performed with the following findings: Yes 12/08/2024  3:39 PM  Visual Inspection No deformities, no ulcerations, no other skin breakdown bilaterally: Yes Sensation Testing See comments: Yes Pulse Check Posterior Tibialis and Dorsalis pulse intact bilaterally: Yes Comments Mild neuropathy bilateral plantar aspect of feet.     No results found. No results found. No results found for this or any previous visit (from the past 24 hours).   Assessment/Plan: ISIDOR BROMELL is a 79 y.o. male present for OV for  Recurrent major depressive disorder, severe (HCC) Stable Continue paxil  to 40 mg Continue Seroquel  50 mg in the day and 150 mg before bed Continue wellbutrin  150 (short acting) mg in the day -Tried meds: Remeron  (possible memory changes), zyprexa  (arthralgias) - Follow-up 4 months  Gastroesophageal reflux disease, esophagitis  present/Barrett's esophagus determined by biopsy Stable Continue Protonix  40 mg twice a day, was unable to take back secondary to recurrence of symptoms. Continue Pepcid  twice daily today B12, vitamin D  and mag collected today Continue routine follow-ups with digestive health specialist  Hypomagnesia Magnesium  levels collected today Continue magnesium  chloride 1 tablet daily-which is called that he can tolerate  Chronic bronchitis, simple (HCC) Stable Continue Symbicort  twice daily  Chronic constipation: Stable Continue Amitiza  8 mcg twice daily  CKD 3 A -Renally dose meds when appropriate Avoid NSAIDs PTH/vitamin D  and calcium  will monitor yearly  Atherosclerosis of native coronary artery of native heart without angina pectoris/Essential hypertension/HYPERCHOLESTEROLEMIA/History of MI (myocardial infarction) Stage 3 chronic kidney disease/ iron  deficiency anemia/acquired thrombophilia Able - following with cards, Dr. Nishan. Continue atorvastatin  80 mg nightly Continue metoprolol  50 mg Continue amlodipine  to 5 mg daily -  plavix  continue per cardiology - low sodium diet and exercise encouraged.   Type 2 diabetes mellitus with hyperlipidemia/CKD 3 - A1c today 5.8--> 6.0-->6.1>>6.7> 6.5> 7.1 >7.1> 6.7> 6.8 >7.8> 7.5> 8.1 > 8.3 > 9.3> 8.2 > 8.6>6.9>7.1> collected today  Goal of A1c less than 7 Continue Farxiga  10 mg daily (medvtx) Continue Mounjaro  7.5 mg every 10 days for now, depending upon A1c we may attempt to go back to every 7 days if he is able to tolerate. Tried: Metformin -discontinued secondary to diarrhea, [name] -patient has reservations about using since hospitalization for COVID-pneumonia and [name]  occurring at the same time. Continue atorvastatin  80 - Foot exam: Completed 12/08/2024 - eye exam: UTD 02/04/2024 - PNA: Series completed - Flu: UTD 2025 - Urine Microalbumin w/creat. Ratio: completed 12/08/2024  Cerebral atrophy/microvascular ischemic occlusive  disease Patient is having mild cognitive changes. Referral had been placed to neurology Patient is on a statin Seeing benefits from Aricept  5 mg nightly-continue  Iron  deficiency anemia: Iron  panel is up-to-date 02/2024.  Patient cannot tolerate iron  supplement, was encouraged to at least start Flintstone vitamin if not able to tolerate iron  polysaccharide  BPH Encouraged him to follow-up with his urologist.   Continue Flomax prescribed by urology.  Continue Myrbetriq  to 50 mg  Intractable hiccups Continue baclofen  5 mg every 8 hours-is working well for him  Reviewed expectations re: course of current medical issues. Discussed self-management of symptoms. Outlined signs and symptoms indicating need for more acute intervention. Patient verbalized understanding and all questions were answered. Patient received an After-Visit Summary.    Orders Placed This Encounter  Procedures   Microalbumin / creatinine urine ratio   Hemoglobin A1c   Comp Met (CMET)   CBC   TSH   Vitamin D  (25 hydroxy)   B12 and Folate Panel   Magnesium    Meds ordered this encounter  Medications   amLODipine  (NORVASC ) 5 MG tablet    Sig: Take 1 tablet (5 mg total) by mouth daily.    Dispense:  90 tablet    Refill:  1   atorvastatin  (LIPITOR) 80 MG tablet    Sig: Take 1 tablet (80 mg total) by mouth daily.    Dispense:  90 tablet    Refill:  3   Baclofen  5 MG TABS    Sig: Take 1 tablet (5 mg total) by mouth every 8 (eight) hours.    Dispense:  90 tablet    Refill:  1   buPROPion  (WELLBUTRIN  SR) 150 MG 12 hr tablet    Sig: Take 1 tablet (150 mg total) by mouth daily.    Dispense:  90 tablet    Refill:  1   clopidogrel  (PLAVIX ) 75 MG tablet    Sig: Take 1 tablet (75 mg total) by mouth daily.    Dispense:  90 tablet    Refill:  3   DISCONTD: dapagliflozin  propanediol (FARXIGA ) 10 MG TABS tablet    Sig: Take 1 tablet (10 mg total) by mouth daily before breakfast. OFFICE VISIT NEEDED FOR FURTHER  REFILL.    Dispense:  30 tablet    Refill:  5    [place].   donepezil  (ARICEPT  ODT) 5 MG disintegrating tablet    Sig: Take 1 tablet (5 mg total) by mouth at bedtime.    Dispense:  90 tablet    Refill:  1   famotidine  (PEPCID ) 20 MG tablet    Sig: Take 1 tablet (20 mg total) by mouth 2 (two) times daily.    Dispense:  180 tablet    Refill:  3   metoprolol  tartrate (LOPRESSOR ) 50 MG tablet    Sig: Take 1 tablet (50 mg total) by mouth daily.    Dispense:  90 tablet    Refill:  1   mirabegron  ER (MYRBETRIQ ) 50 MG TB24 tablet    Sig: Take 1 tablet (50 mg total) by mouth daily.    Dispense:  90 tablet    Refill:  1   SYMBICORT  160-4.5 MCG/ACT inhaler    Sig: Inhale 2 puffs into the lungs 2 (two) times daily.    Dispense:  10.2 each    Refill:  4   PARoxetine  (PAXIL ) 40 MG tablet    Sig: Take 1 tablet (40 mg total) by mouth daily.    Dispense:  90 tablet    Refill:  1   QUEtiapine  (SEROQUEL ) 100 MG tablet    Sig: 1/2 tab in the morning, 1.5 tab before bed    Dispense:  180 tablet    Refill:  1   lubiprostone  (AMITIZA ) 8 MCG capsule    Sig: Take 1 capsule (8 mcg total) by mouth 2 (two) times daily with a meal.    Dispense:  180  capsule    Refill:  1   pantoprazole  (PROTONIX ) 40 MG tablet    Sig: Take 1 tablet (40 mg total) by mouth 2 (two) times daily.    Dispense:  180 tablet    Refill:  1   dapagliflozin  propanediol (FARXIGA ) 10 MG TABS tablet    Sig: Take 1 tablet (10 mg total) by mouth daily before breakfast. OFFICE VISIT NEEDED FOR FURTHER REFILL.    Dispense:  30 tablet    Refill:  5    [place].   Polysaccharide Iron  Complex 434.8 (200 Fe) MG CAPS    Sig: Take 1 capsule by mouth daily.    Dispense:  30 capsule    Refill:  5   Referral Orders  No referral(s) requested today    I personally spent a total of 53 minutes in the care of the patient today including preparing to see the patient,  getting/reviewing separately obtained history, performing a medically appropriate exam/evaluation, counseling and educating, placing orders, documenting clinical information in the EHR, communicating results, and coordinating care.    Note is dictated utilizing voice recognition software. Although note has been proof read prior to signing, occasional typographical errors still can be missed. If any questions arise, please do not hesitate to call for verification.   electronically signed by:  [name] Bellini, DO  [facility] [place]    "

## 2024-12-08 NOTE — Patient Instructions (Signed)
 Return in about 15 weeks (around 03/23/2025) for Routine chronic condition follow-up.        Great to see you today.  I have refilled the medication(s) we provide.   If labs were collected or images ordered, we will inform you of  results once we have received them and reviewed. We will contact you either by echart message, or telephone call.  Please give ample time to the testing facility, and our office to run,  receive and review results. Please do not call inquiring of results, even if you can see them in your chart. We will contact you as soon as we are able. If it has been over 1 week since the test was completed, and you have not yet heard from us , then please call us .    - echart message- for normal results that have been seen by the patient already.   - telephone call: abnormal results or if patient has not viewed results in their echart.  If a referral to a specialist was entered for you, please call us  in 2 weeks if you have not heard from the specialist office to schedule.

## 2024-12-09 ENCOUNTER — Ambulatory Visit: Payer: Self-pay | Admitting: Family Medicine

## 2024-12-09 MED ORDER — GLIPIZIDE 10 MG PO TABS: ORAL_TABLET | ORAL | 1 refills | Status: AC

## 2024-12-09 NOTE — Telephone Encounter (Signed)
 Please call patient: Ask where he wants the Farxiga  prescription to go to, I had sent it to the same places last time which was a different type of mail-in pharmacy.  Please make sure that is correct and if not send to the pharmacy they choose.  I recommend we restart the glipizide  once daily.  I have called that into his pharmacy.  This should be taken with the largest meal of the day       -Continue the Mounjaro  every 10 days and if able to tolerate slowly increase to every 7 days.  3.  Blood cell counts B12, magnesium , folate and vitamin D  levels look great.  Continue the supplementations as is.  4.  Urine protein levels are normal.   A1c went up a great deal to 9.2.  Make sure to follow-up with diabetic diet. Kidney, liver, thyroid  function is stable   Follow-up in 4 months-please make sure this is scheduled

## 2025-03-14 ENCOUNTER — Ambulatory Visit: Admitting: Family Medicine
